# Patient Record
Sex: Female | Born: 1935 | Race: White | Hispanic: No | Smoking: Never smoker
Health system: Southern US, Community
[De-identification: ages and names within clinical notes are randomized; demographics above are authoritative.]

## PROBLEM LIST (undated history)

## (undated) DIAGNOSIS — Z8619 Personal history of other infectious and parasitic diseases: Secondary | ICD-10-CM

## (undated) DIAGNOSIS — E079 Disorder of thyroid, unspecified: Secondary | ICD-10-CM

## (undated) DIAGNOSIS — C679 Malignant neoplasm of bladder, unspecified: Secondary | ICD-10-CM

## (undated) DIAGNOSIS — D649 Anemia, unspecified: Secondary | ICD-10-CM

## (undated) DIAGNOSIS — E785 Hyperlipidemia, unspecified: Secondary | ICD-10-CM

## (undated) DIAGNOSIS — G8929 Other chronic pain: Secondary | ICD-10-CM

## (undated) DIAGNOSIS — M5136 Other intervertebral disc degeneration, lumbar region: Secondary | ICD-10-CM

## (undated) DIAGNOSIS — M545 Low back pain, unspecified: Secondary | ICD-10-CM

## (undated) DIAGNOSIS — C801 Malignant (primary) neoplasm, unspecified: Secondary | ICD-10-CM

## (undated) DIAGNOSIS — H409 Unspecified glaucoma: Secondary | ICD-10-CM

## (undated) DIAGNOSIS — I739 Peripheral vascular disease, unspecified: Secondary | ICD-10-CM

## (undated) DIAGNOSIS — M4620 Osteomyelitis of vertebra, site unspecified: Secondary | ICD-10-CM

## (undated) DIAGNOSIS — M51369 Other intervertebral disc degeneration, lumbar region without mention of lumbar back pain or lower extremity pain: Secondary | ICD-10-CM

## (undated) DIAGNOSIS — Z87820 Personal history of traumatic brain injury: Secondary | ICD-10-CM

## (undated) DIAGNOSIS — K635 Polyp of colon: Secondary | ICD-10-CM

## (undated) DIAGNOSIS — Z9221 Personal history of antineoplastic chemotherapy: Secondary | ICD-10-CM

## (undated) DIAGNOSIS — H9190 Unspecified hearing loss, unspecified ear: Secondary | ICD-10-CM

## (undated) DIAGNOSIS — E042 Nontoxic multinodular goiter: Secondary | ICD-10-CM

## (undated) DIAGNOSIS — M5030 Other cervical disc degeneration, unspecified cervical region: Secondary | ICD-10-CM

## (undated) DIAGNOSIS — Z9289 Personal history of other medical treatment: Secondary | ICD-10-CM

## (undated) DIAGNOSIS — M810 Age-related osteoporosis without current pathological fracture: Secondary | ICD-10-CM

## (undated) DIAGNOSIS — K219 Gastro-esophageal reflux disease without esophagitis: Secondary | ICD-10-CM

## (undated) DIAGNOSIS — I10 Essential (primary) hypertension: Secondary | ICD-10-CM

## (undated) DIAGNOSIS — R011 Cardiac murmur, unspecified: Secondary | ICD-10-CM

## (undated) DIAGNOSIS — R600 Localized edema: Secondary | ICD-10-CM

## (undated) DIAGNOSIS — K2210 Ulcer of esophagus without bleeding: Secondary | ICD-10-CM

## (undated) DIAGNOSIS — M79673 Pain in unspecified foot: Secondary | ICD-10-CM

## (undated) DIAGNOSIS — K259 Gastric ulcer, unspecified as acute or chronic, without hemorrhage or perforation: Secondary | ICD-10-CM

## (undated) HISTORY — DX: Other chronic pain: G89.29

## (undated) HISTORY — DX: Anemia, unspecified: D64.9

## (undated) HISTORY — DX: Low back pain: M54.5

## (undated) HISTORY — DX: Malignant (primary) neoplasm, unspecified: C80.1

## (undated) HISTORY — PX: FOOT SURGERY: SHX648

## (undated) HISTORY — DX: Polyp of colon: K63.5

## (undated) HISTORY — DX: Essential (primary) hypertension: I10

## (undated) HISTORY — PX: EYE SURGERY: SHX253

## (undated) HISTORY — DX: Other intervertebral disc degeneration, lumbar region: M51.36

## (undated) HISTORY — DX: Other cervical disc degeneration, unspecified cervical region: M50.30

## (undated) HISTORY — DX: Hyperlipidemia, unspecified: E78.5

## (undated) HISTORY — DX: Gastric ulcer, unspecified as acute or chronic, without hemorrhage or perforation: K25.9

## (undated) HISTORY — PX: MANDIBLE SURGERY: SHX707

## (undated) HISTORY — DX: Other intervertebral disc degeneration, lumbar region without mention of lumbar back pain or lower extremity pain: M51.369

## (undated) HISTORY — PX: KNEE ARTHROSCOPY: SHX127

## (undated) HISTORY — DX: Age-related osteoporosis without current pathological fracture: M81.0

## (undated) HISTORY — DX: Low back pain, unspecified: M54.50

## (undated) HISTORY — DX: Pain in unspecified foot: M79.673

## (undated) HISTORY — PX: CATARACT EXTRACTION: SUR2

## (undated) HISTORY — PX: CARPAL TUNNEL RELEASE: SHX101

## (undated) HISTORY — DX: Unspecified glaucoma: H40.9

## (undated) HISTORY — PX: WRIST FRACTURE SURGERY: SHX121

## (undated) HISTORY — DX: Peripheral vascular disease, unspecified: I73.9

## (undated) HISTORY — PX: BACK SURGERY: SHX140

---

## 1971-05-29 HISTORY — PX: ABDOMINAL HYSTERECTOMY: SHX81

## 1997-09-08 ENCOUNTER — Inpatient Hospital Stay (HOSPITAL_COMMUNITY): Admission: RE | Admit: 1997-09-08 | Discharge: 1997-09-17 | Payer: Self-pay | Admitting: Neurosurgery

## 1999-08-30 ENCOUNTER — Encounter: Admission: RE | Admit: 1999-08-30 | Discharge: 1999-08-30 | Payer: Self-pay | Admitting: Neurosurgery

## 1999-08-30 ENCOUNTER — Encounter: Payer: Self-pay | Admitting: Neurosurgery

## 2000-03-25 ENCOUNTER — Encounter: Admission: RE | Admit: 2000-03-25 | Discharge: 2000-05-07 | Payer: Self-pay | Admitting: Family Medicine

## 2004-08-28 ENCOUNTER — Ambulatory Visit: Payer: Self-pay | Admitting: Internal Medicine

## 2004-10-19 ENCOUNTER — Ambulatory Visit: Payer: Self-pay | Admitting: Podiatry

## 2004-12-26 ENCOUNTER — Ambulatory Visit: Payer: Self-pay | Admitting: Podiatry

## 2004-12-29 ENCOUNTER — Ambulatory Visit: Payer: Self-pay | Admitting: Podiatry

## 2005-09-27 ENCOUNTER — Ambulatory Visit: Payer: Self-pay | Admitting: Internal Medicine

## 2006-09-30 ENCOUNTER — Ambulatory Visit: Payer: Self-pay | Admitting: Internal Medicine

## 2007-01-09 ENCOUNTER — Ambulatory Visit: Payer: Self-pay | Admitting: Unknown Physician Specialty

## 2007-02-03 ENCOUNTER — Other Ambulatory Visit: Payer: Self-pay

## 2007-02-03 ENCOUNTER — Ambulatory Visit: Payer: Self-pay | Admitting: Unknown Physician Specialty

## 2007-02-10 ENCOUNTER — Inpatient Hospital Stay: Payer: Self-pay | Admitting: Unknown Physician Specialty

## 2007-03-04 ENCOUNTER — Ambulatory Visit: Payer: Self-pay | Admitting: Unknown Physician Specialty

## 2007-07-01 ENCOUNTER — Ambulatory Visit: Payer: Self-pay | Admitting: Internal Medicine

## 2007-11-20 ENCOUNTER — Ambulatory Visit: Payer: Self-pay | Admitting: Internal Medicine

## 2008-03-05 ENCOUNTER — Other Ambulatory Visit: Payer: Self-pay

## 2008-10-14 ENCOUNTER — Ambulatory Visit: Payer: Self-pay

## 2009-02-16 ENCOUNTER — Ambulatory Visit: Payer: Self-pay | Admitting: Internal Medicine

## 2009-03-08 ENCOUNTER — Ambulatory Visit: Payer: Self-pay | Admitting: Unknown Physician Specialty

## 2009-09-06 ENCOUNTER — Ambulatory Visit: Payer: Self-pay

## 2009-09-23 ENCOUNTER — Ambulatory Visit: Payer: Self-pay | Admitting: Unknown Physician Specialty

## 2009-09-27 ENCOUNTER — Ambulatory Visit: Payer: Self-pay | Admitting: Unknown Physician Specialty

## 2010-02-21 ENCOUNTER — Ambulatory Visit: Payer: Self-pay | Admitting: Internal Medicine

## 2011-02-23 ENCOUNTER — Ambulatory Visit: Payer: Self-pay | Admitting: Internal Medicine

## 2011-05-13 ENCOUNTER — Ambulatory Visit: Payer: Self-pay

## 2012-02-27 ENCOUNTER — Ambulatory Visit: Payer: Self-pay | Admitting: Internal Medicine

## 2012-04-27 DIAGNOSIS — Z87820 Personal history of traumatic brain injury: Secondary | ICD-10-CM

## 2012-04-27 HISTORY — DX: Personal history of traumatic brain injury: Z87.820

## 2012-05-16 DIAGNOSIS — S020XXA Fracture of vault of skull, initial encounter for closed fracture: Secondary | ICD-10-CM | POA: Insufficient documentation

## 2012-05-16 DIAGNOSIS — S065X9A Traumatic subdural hemorrhage with loss of consciousness of unspecified duration, initial encounter: Secondary | ICD-10-CM | POA: Insufficient documentation

## 2012-05-16 DIAGNOSIS — I609 Nontraumatic subarachnoid hemorrhage, unspecified: Secondary | ICD-10-CM | POA: Insufficient documentation

## 2012-05-16 DIAGNOSIS — S0219XA Other fracture of base of skull, initial encounter for closed fracture: Secondary | ICD-10-CM | POA: Insufficient documentation

## 2012-05-16 DIAGNOSIS — W108XXA Fall (on) (from) other stairs and steps, initial encounter: Secondary | ICD-10-CM | POA: Insufficient documentation

## 2012-05-16 DIAGNOSIS — D62 Acute posthemorrhagic anemia: Secondary | ICD-10-CM | POA: Insufficient documentation

## 2013-02-27 ENCOUNTER — Ambulatory Visit: Payer: Self-pay | Admitting: Internal Medicine

## 2013-09-23 ENCOUNTER — Ambulatory Visit: Payer: Self-pay | Admitting: Ophthalmology

## 2013-09-23 DIAGNOSIS — Z01810 Encounter for preprocedural cardiovascular examination: Secondary | ICD-10-CM

## 2013-09-23 DIAGNOSIS — I10 Essential (primary) hypertension: Secondary | ICD-10-CM

## 2013-09-23 LAB — POTASSIUM: Potassium: 4.4 mmol/L (ref 3.5–5.1)

## 2013-10-07 ENCOUNTER — Ambulatory Visit: Payer: Self-pay | Admitting: Ophthalmology

## 2013-10-28 DIAGNOSIS — M47812 Spondylosis without myelopathy or radiculopathy, cervical region: Secondary | ICD-10-CM | POA: Insufficient documentation

## 2013-10-28 DIAGNOSIS — M5030 Other cervical disc degeneration, unspecified cervical region: Secondary | ICD-10-CM | POA: Insufficient documentation

## 2013-11-04 ENCOUNTER — Ambulatory Visit: Payer: Self-pay | Admitting: Ophthalmology

## 2013-11-04 LAB — POTASSIUM: Potassium: 3.6 mmol/L (ref 3.5–5.1)

## 2013-11-10 ENCOUNTER — Ambulatory Visit: Payer: Self-pay | Admitting: Ophthalmology

## 2014-01-09 DIAGNOSIS — I739 Peripheral vascular disease, unspecified: Secondary | ICD-10-CM

## 2014-01-09 DIAGNOSIS — M810 Age-related osteoporosis without current pathological fracture: Secondary | ICD-10-CM | POA: Insufficient documentation

## 2014-01-09 DIAGNOSIS — R609 Edema, unspecified: Secondary | ICD-10-CM | POA: Insufficient documentation

## 2014-01-09 DIAGNOSIS — N183 Chronic kidney disease, stage 3 unspecified: Secondary | ICD-10-CM | POA: Insufficient documentation

## 2014-01-09 DIAGNOSIS — I129 Hypertensive chronic kidney disease with stage 1 through stage 4 chronic kidney disease, or unspecified chronic kidney disease: Secondary | ICD-10-CM | POA: Insufficient documentation

## 2014-01-09 DIAGNOSIS — I779 Disorder of arteries and arterioles, unspecified: Secondary | ICD-10-CM | POA: Insufficient documentation

## 2014-01-09 DIAGNOSIS — E785 Hyperlipidemia, unspecified: Secondary | ICD-10-CM | POA: Insufficient documentation

## 2014-01-09 DIAGNOSIS — E782 Mixed hyperlipidemia: Secondary | ICD-10-CM | POA: Insufficient documentation

## 2014-01-09 DIAGNOSIS — M1990 Unspecified osteoarthritis, unspecified site: Secondary | ICD-10-CM | POA: Insufficient documentation

## 2014-01-09 HISTORY — DX: Disorder of arteries and arterioles, unspecified: I77.9

## 2014-03-02 ENCOUNTER — Ambulatory Visit: Payer: Self-pay | Admitting: Internal Medicine

## 2014-04-08 ENCOUNTER — Ambulatory Visit: Payer: Self-pay | Admitting: Unknown Physician Specialty

## 2014-05-18 ENCOUNTER — Ambulatory Visit: Payer: Self-pay | Admitting: Unknown Physician Specialty

## 2014-05-28 DIAGNOSIS — C801 Malignant (primary) neoplasm, unspecified: Secondary | ICD-10-CM

## 2014-05-28 HISTORY — DX: Malignant (primary) neoplasm, unspecified: C80.1

## 2014-07-27 ENCOUNTER — Ambulatory Visit
Admit: 2014-07-27 | Disposition: A | Discharge status: Home / Self Care | Payer: Self-pay | Attending: Internal Medicine | Admitting: Internal Medicine

## 2014-08-03 ENCOUNTER — Inpatient Hospital Stay: Payer: Self-pay | Admitting: Internal Medicine

## 2014-08-27 ENCOUNTER — Ambulatory Visit
Admit: 2014-08-27 | Disposition: A | Discharge status: Home / Self Care | Payer: Self-pay | Attending: Internal Medicine | Admitting: Internal Medicine

## 2014-09-08 LAB — CANCER CENTER HEMOGLOBIN: HGB: 8.9 g/dL — ABNORMAL LOW (ref 12.0–16.0)

## 2014-09-18 NOTE — Op Note (Signed)
PATIENT NAME:  [name], GROSSO MR#:  [id number] DATE OF BIRTH:  08-03-1932  DATE OF PROCEDURE:  10/07/2013  PREOPERATIVE DIAGNOSIS:  Senile cataract of the right eye.  POSTOPERATIVE DIAGNOSIS:  Senile cataract of the right eye.  PROCEDURE:  Phacoemulsification with posterior chamber intraocular lens placement of the right eye.   LENS: ZCT150 23.5-diopter posterior chamber toric intraocular lens with 1.5-diopters of cylindrical power placed at axis 168 degrees.  ULTRASOUND TIME:  16% of 1 minute and 53 seconds.  CDE 17.7.   SURGEON:  Mali Vicenta Olds, MD  ANESTHESIA:  Topical with tetracaine drops and 2% Xylocaine jelly.  COMPLICATIONS:  None.  DESCRIPTION OF PROCEDURE:  The patient was identified in the holding room and transported to the operating suite and placed in upright position.  The eye was marked so that the 0, 90 and 180 degree axis were clearly marked with purple ink.  Xylocaine gel was placed into the eye and then the eye was prepped and draped in the usual sterile ophthalmic fashion.  The operating microscope was placed into position.   A 1-millimeter clear-corneal paracentesis incision was made at the 12 o'clock position.  The anterior chamber was filled with Viscoat.  A 2.4-millimeter near clear corneal incision was then made at the 9 o'clock position.  A cystotome and capsulorrhexis forceps were then used to make a curvilinear capsulorrhexis.  Hydrodissect and hydrodelineation were then performed using balanced salt solution.   Phacoemulsification was then used in stop and chop fashion to remove the lens, nucleus and epinucleus.  The remaining cortex was aspirated using the irrigation and aspiration handpiece.  Provisc viscoelastic was then placed into the capsular bag to distend it for lens placement.  An axis marker was then dipped in purple ink and used to mark the axis position of 168 degrees for placement of the toric intraocular lens.  The 168 degree axis has been  previously determined by the toric lens calculator.   A ZCT150 23.5-diopter lens was then injected into the capsular bag.  It was rotated clockwise until the axis marks on the lens were approximately 15 degrees in the counterclockwise direction to the 168 degree axis mark on the cornea.  The viscoelastic was aspirated from the eye using the irrigation aspiration handpiece.  Then, a Sinskey hook through the side port incision was used to rotate the lens in a clockwise direction until the axis markings of the intraocular lens were lined up with the axis markings on the cornea.  Balanced salt solution was then used to hydrate the wounds.   Miostat was placed into the anterior chamber to constrict the pupil.  0.1 mL of cefuroxime 10 mg/mL were injected into the anterior chamber for a dose of 1 mg of intracameral antibiotic at the completion of the case. The eye was noted to have a physiologic pressure and there was no wound leak noted.  Topical Vigamox drops and erythromycin ointment were applied to the eye.  The patient was taken to the recovery room in stable condition having had no  complications of anesthesia or surgery.  ____________________________ Wyonia Hough, MD crb:aw D: 10/07/2013 13:38:20 ET T: 10/07/2013 13:55:18 ET JOB#: [id number]  cc: Wyonia Hough, MD, <Dictator> Leandrew Koyanagi MD ELECTRONICALLY SIGNED 10/14/2013 12:38

## 2014-09-18 NOTE — Op Note (Signed)
PATIENT NAME:  [name], [name] MR#:  [id number] DATE OF BIRTH:  1933-01-28  DATE OF PROCEDURE:  11/10/2013  PREOPERATIVE DIAGNOSIS:  Senile cataract of the left eye.  POSTOPERATIVE DIAGNOSIS:  Senile cataract of the left eye.  PROCEDURE:  Phacoemulsification with posterior chamber intraocular lens placement of the left eye.   LENS: ZCT150 23.0-diopter posterior chamber toric intraocular lens with 1.5 diopters of cylindrical power placed at axis 14 degrees.  ULTRASOUND TIME:  14% of 1 minute, 8 seconds.  CDE 9.8.   SURGEON:  Mali Brasington, MD  ANESTHESIA:  Topical with tetracaine drops and 2% Xylocaine jelly.  COMPLICATIONS:  None.  DESCRIPTION OF PROCEDURE:  The patient was identified in the holding room and transported to the operating suite and placed in upright position.  The eye was marked so that the 0, 90 and 180 degree axis were clearly marked with purple ink.  Xylocaine gel was placed into the eye and then the eye was prepped and draped in the usual sterile ophthalmic fashion.  The operating microscope was placed into position.   A clear-corneal paracentesis incision was made at the 1:30 o'clock position.  The anterior chamber was filled with Viscoat.  A 2.4-millimeter near clear corneal incision was then made at the 10:30 o'clock position.  A cystotome and capsulorrhexis forceps were then used to make a curvilinear capsulorrhexis.  Hydrodissect and hydrodelineation were then performed using balanced salt solution.   Phacoemulsification was then used in stop and chop fashion to remove the lens, nucleus and epinucleus.  The remaining cortex was aspirated using the irrigation and aspiration handpiece.  Provisc viscoelastic was then placed into the capsular bag to distend it for lens placement.  An axis marker was then dipped in purple ink and used to mark the axis position of 14 degrees for placement of the toric intraocular lens.  The 14 degree axis has been previously determined  by the Verion digital marker.   A ZCT150 23.0-diopter lens was then injected into the capsular bag.  It was rotated clockwise until the axis marks on the lens were approximately 15 degrees in the counterclockwise direction to the 14 degree axis mark on the cornea.  The viscoelastic was aspirated from the eye using the irrigation aspiration handpiece.  Then, a Sinskey hook through the side port incision was used to rotate the lens in a clockwise direction until the axis markings of the intraocular lens were lined up with the axis markings on the cornea.  Balanced salt solution was then used to hydrate the wounds.   Miostat was placed into the anterior chamber to constrict the pupil.  0.1 mL of cefuroxime 10 mg/mL were injected into the anterior chamber for a dose of 1 mg of intracameral antibiotic at the completion of the case. The eye was noted to have a physiologic pressure and there was no wound leak noted.  Topical Vigamox drops and erythromycin ointment were applied to the eye.  The patient was taken to the recovery room in stable condition having had no complications of anesthesia or surgery.  ____________________________ Wyonia Hough, MD crb:aw D: 11/10/2013 12:05:16 ET T: 11/10/2013 12:27:02 ET JOB#: [id number]  cc: Wyonia Hough, MD, <Dictator>  Leandrew Koyanagi MD ELECTRONICALLY SIGNED 11/11/2013 9:36

## 2014-09-20 LAB — SURGICAL PATHOLOGY

## 2014-09-22 ENCOUNTER — Ambulatory Visit
Admit: 2014-09-22 | Disposition: A | Discharge status: Home / Self Care | Payer: Self-pay | Attending: Urology | Admitting: Urology

## 2014-09-22 ENCOUNTER — Ambulatory Visit: Payer: Self-pay

## 2014-09-22 DIAGNOSIS — M461 Sacroiliitis, not elsewhere classified: Secondary | ICD-10-CM | POA: Insufficient documentation

## 2014-09-26 NOTE — Discharge Summary (Signed)
PATIENT NAME:  [name], [name] MR#:  [id number] DATE OF BIRTH:  Aug 09, 1932  DATE OF ADMISSION:  08/03/2014 DATE OF DISCHARGE:  08/05/2014   DISCHARGE DIAGNOSES:  1.  Symptomatic anemia, likely due to combination of gastritis and ulcer in the duodenum and iron deficiency weight loss from thyroid and urinary bladder nodules, need further work-up as outpatient in [place].  2.  Hypertension.   CONDITION ON DISCHARGE: Stable.   MEDICATIONS ON DISCHARGE:  1.  Evista 60 mg oral tablet once a day.  2.  Calcium and vitamin D 500 mg 2 times a day.  3.  Pravastatin 40 mg once a day.  4.  Tylenol 625 mg 2 times a day as needed.  5.  Carvedilol 25 mg oral tablet 2 times a day.  6.  Latanoprost ophthalmic drops each affected eye once a day.  7.  Lisinopril 10 mg once a day.  8.  Cardizem 240 mg extended release once a day.  9.  Pantoprazole 40 mg 2 times a day.  10. Ferrous sulfate 325 mg 3 times a day.    FOLLOW UP:  Advised to follow in 1 to 2 weeks in [place] for CBC check and 4 to 6 weeks in GI clinic and 1 to 2 weeks in urology clinic.   HISTORY OF PRESENT ILLNESS: A 81 year old female with history of cervical spondylosis, hyperlipidemia, hypertension, colon polyp and some internal hemorrhoid.  She has been losing weight almost 25 pounds in the last 1 year feeling very tired for a few months and so went to annual check-up to Dr. Tonette Bihari office who ordered some blood work and found hemoglobin to be 6 so she was called in for a direct admission.   HOSPITAL COURSE AND STAY:   1.  Symptomatic anemia. She was feeling tired and weak. Hemoglobin was low. Microcytic anemia was noted.  Stool guaiac was negative, 2 units of PRBC transfusion was given. After that, hemoglobin was stable.  IV iron therapy was ordered by oncology team and suggested to follow in [place].  She also was found to have gastritis and endoscopy was done and suggested PPI b.i.d. and follow-up in GI clinic after  a few weeks.  2.  Weight loss and generalized weakness. Dr. Ma Hillock ordered CT scan of the chest and pelvis which showed some nodules in the urinary bladder and thyroid nodule. Urologic consult was called in, and he suggested to follow in the office and cystoscopy can be done as an outpatient for biopsy, but did not think that it might be cancerous nodule.  Thyroid nodule was found on CT and ultrasound of the neck, and Dr. Leia Alf agreed to take care of it at the [facility] on her further follow-ups.  3.  Hypertension. We continued carvedilol.  4.  Hyperlipidemia. Continue pravastatin.  5.  Glaucoma. Continue latanoprost.  6.  Osteoarthritis. The patient was taking extra strength Tylenol tablets and we advised to stop taking that because of gastritis and give her PPI b.i.d.  7.  Anticoagulation. She was not on any chemical anticoagulation for prevention of deep vein thrombosis in the hospital because of low hemoglobin issue. She remains stable.    [place]:  Gastrointestinal consult with Dr. Gustavo Lah.    IMPORTANT LABORATORY RESULTS:  WBC count 8.7, hemoglobin is 5.8, platelet count platelet count is 283,000.   BMP, glucose 106, BUN 16, creatinine 0.7, sodium 138, potassium 4.2, chloride 103, and CO2 of 26.  INR 1.2,  iron saturation was 3 and iron serum was 10. Hemoglobin came up to 7.7 after transfusion.  Occult blood was negative in the stool.  Ultrasound of the soft tissue neck, bilateral nodule, and abdominal nodule in the lower pole of the right lobe need further work-up.      ____________________________ Ceasar Lund Anselm Jungling, MD vgv:DT D: 08/09/2014 12:20:02 ET T: 08/09/2014 13:14:06 ET JOB#: [id number]  cc: Ceasar Lund. Anselm Jungling, MD, <Dictator> Lollie Sails, MD Rhett Bannister. Ma Hillock, MD  Vaughan Basta MD ELECTRONICALLY SIGNED 08/17/2014 12:13

## 2014-09-26 NOTE — Consult Note (Signed)
ONCOLOGY followup note - still weak. Overall better. no fevers. Eating well.resting in bed, alert and oriented, no acute distress.            vitals - afebrile, stable.          lungs - bilateral good breath sounds          abd - soft, NT          ext - pedal edema  WBC 8500, Hb 7.7, platelets 564. Stool OB negative. Serum iron 10, iron saturation 3%. Serum B12, folate, Coombs  test, LDH unremarkable.  1. Iron def anemia, stool OB negative. Given this alongwith unintentional weight loss, will consult GI. Hb slightly better. Will give 1 dose IV Venofer 500 mg today to try improve symptomatic anemia quicker. If no GI procedures planned,  ten start on oral ferrous sulfate 325 mg TID w/meals.Unintentional weight loss despite eating well  - CT scan shows possible bladder lesion. Agree with getting urology eval. There is a right thyroid mass, will get thyroid US to evaluate further. and no other obvious etiology or explanation for this weight loss. Serum TSH normal. continue to follow.  Electronic Signatures: Jonn Shingles (MD)  (Signed on 09-Mar-16 23:26)  Authored  Last Updated: 09-Mar-16 23:26 by Jonn Shingles (MD)

## 2014-09-26 NOTE — H&P (Signed)
PATIENT NAME:  [name], [name] MR#:  [id number] DATE OF BIRTH:  26-Sep-1932  PRIMARY CARE PHYSICIAN: Ocie Cornfield. Ouida Sills, [place] PHYSICIAN: Ocie Cornfield. Ouida Sills, MD   CHIEF COMPLAINT: Weakness and low hemoglobin.   HISTORY OF PRESENT ILLNESS: This is a 81 year old female, who has history of cervical spondylosis, hyperlipidemia, hypertension, colon polyps and some internal hemorrhoids. She said that for the last 1 year, she has been losing weight, almost 25 pounds in 1 year. She has started feeling very tired and weak for the last few months. Yesterday she had her regular annual checkup with Dr. Ouida Sills and he ordered some blood work, which reported her hemoglobin to be 6, so he called the patient and told her she has to go to the hospital to get admitted and get a  blood transfusion. He called me for direct admission. On my further questioning, the patient denies any other complaint of noticing blood in the stool or urine. She takes Tylenol extra strength 650 mg 2 tablets in the morning and in the evening, so a total of 4 tablets a day. She denies using any over-the-counter pain medications or BC powders, denies any nausea, vomiting, or abdominal pain. Denies any nausea, vomiting or abnormal pain. Denies any abnormal vaginal discharge or bleeding.   REVIEW OF SYSTEMS:  CONSTITUTIONAL: Positive for generalized weakness and fatigue and weight loss. Negative for fever.  EYES: No blurring of vision, discharge or redness.  EARS, NOSE, THROAT: No tinnitus, ear pain, or hearing loss.  RESPIRATORY: No cough, wheezing, hemoptysis or shortness of breath.  CARDIOVASCULAR: No chest pain, orthopnea, edema, arrhythmia, palpitation.  GASTROINTESTINAL: No nausea, vomiting, diarrhea, abdominal pain.  GENITOURINARY: No dysuria, hematuria, increased frequency.  ENDOCRINE: No heat or cold intolerance. No excessive sweating.  SKIN: No acne, rashes, or lesions.  MUSCULOSKELETAL: No pain or swelling in  the joints.  NEUROLOGICAL: No numbness, weakness, tremor or vertigo.  PSYCHIATRIC: No anxiety, insomnia, bipolar disorder.   PAST MEDICAL HISTORY:  1. Hyperlipidemia.  2. Chronic low back pain.  3. Hypertension.  5. Foot pain. 6. Osteoporosis.  7. History of colon polyp and internal hemorrhoid.   PAST SURGICAL HISTORY:  1. Lumbar diskectomy.  2. Status post hysterectomy.  3. C-spine surgery and back surgery total back surgeries x4, total of 6 back surgeries and spinal fusions.  4. Colonoscopy.  Had polyps in the past, but in November 2015, there was no polyp, just some internal hemorrhoids.   FAMILY HISTORY: Positive for mother having heart disease and hypertension and father having emphysema. Sister has diabetes. Her mother lived up to 66 years, father died at age of 8.   SOCIAL HISTORY: She is widowed for 6 years, lives alone. Her son lives nearby and takes care of her. No smoking. No drinking, alcohol no illegal drug use and ready active for her age.   CURRENT MEDICATIONS: 1. Raloxifene and 60 mg by mouth once a day.  2. Aspirin 81 mg once a day.  3. Calcium 500 plus vitamin D3 2 times a day.  4. Carvedilol 25 mg 12.5 mg tablet 2 times a day.  5. Cardizem 240 mg extended release once a day.  6. Gabapentin 400 mg 2 capsules at night.  7. Pravastatin 40 mg at night.  8. Torsemide 20 mg by mouth daily as needed.   PHYSICAL EXAMINATION:  VITAL SIGNS: Temperature 98.2, pulse is 72, respirations 16, blood pressure 115/65, pulse oximetry is 98% on room air.  GENERAL: The patient is  fully alert and oriented to time, place, and person. Does not appear in acute distress.  HEENT: Head and neck atraumatic. Conjunctivae are pale. Oral mucosa moist. Sclerae anicteric.  NECK: Supple. No JVD.  Thyroid nontender RESPIRATORY: Bilateral equal and clear air entry. No crepitation or wheezing.  CARDIOVASCULAR: S1, S2 present, regular. No murmur appreciated. No tenderness on chest palpation.   ABDOMEN: Soft, nontender. Bowel sounds present. No organomegaly. No distention.  SKIN: No acne, rashes, or lesions.  JOINTS: No swelling or tenderness.  NEUROLOGICAL: Power 5/5, follows commands. Sensation is intact. Reflexes intact. No tremor or rigidity.  PSYCHIATRIC: Does not appear in acute psychiatric illness at this time.   IMPORTANT LABORATORY RESULTS: Glucose 106, BUN 16, creatinine 0.7, sodium 138, potassium 4.2, chloride 103, CO2 26, and calcium is 9. Total protein is 7.4, albumin 3, bilirubin 0.7, alkaline phosphatase 65, SGOT 13, SGPT 11. WBC is 8.7, hemoglobin 5.8, platelet count 583,000, MCV is 64. INR is 1.2.   ASSESSMENT AND PLAN: A 81 year old female with a history of some polyps and internal hemorrhoids, presented to hospital, as Dr. Ouida Sills told her to go to hospital and get admitted directly for her low hemoglobin level.   1. Symptomatic anemia, as the patient was feeling tired and weak and hemoglobin is low. Will transfuse 2 units of hemoglobin. Microcytic anemia ordered iron studies and stool guaiac and gastroenterology consult and oncology consult also.  2. Weight loss and generalized weakness. Will also get oncology consult for further management of this issue and also to help with having anemia diagnosis.  3. Hypertension. We will continue her carvedilol.  4. Hyperlipidemia. Continue her pravastatin.  5. Glaucoma. Continue Latanoprost.  6. Osteoporosis. She takes calcium and vitamin D at home. Currently will hold it until further workup.  7. Osteoarthritis. Takes Tylenol Extra Strength 2 tablets 2 times a day. We will currently hold it because of this low hemoglobin issue.  8. Anticoagulation. Will not any chemical anticoagulation at this time, just provide sequential compression devices.   CODE STATUS: Full code.   TOTAL TIME SPENT ON THIS ADMISSION: 50 minutes.    ____________________________ Ceasar Lund Anselm Jungling, MD vgv:ap D: 08/03/2014 13:49:44  ET T: 08/03/2014 14:51:59 ET JOB#: [id number]  cc: Ceasar Lund. Anselm Jungling, MD, <Dictator> Ocie Cornfield. Ouida Sills, MD Vaughan Basta MD ELECTRONICALLY SIGNED 08/12/2014 11:52

## 2014-09-26 NOTE — Consult Note (Signed)
Chief Complaint:  Subjective/Chief Complaint seen for IDA.  doing well , feeling some better after tfx.  no abd pain or nausea, no bm since admission yesterday.  Has seen no evidence of GI bleeding.   VITAL SIGNS/ANCILLARY NOTES: **Vital Signs.:   10-Mar-16 08:46  Vital Signs Type Routine  Temperature Temperature (F) 98  Temperature Source oral  Pulse Pulse 60  Respirations Respirations 18  Systolic BP Systolic BP 160  Diastolic BP (mmHg) Diastolic BP (mmHg) 64  Mean BP 93  Pulse Ox % Pulse Ox % 93  Pulse Ox Activity Level  At rest  Oxygen Delivery Room Air/ 21 %   Brief Assessment:  Cardiac Regular   Respiratory clear BS   Gastrointestinal details normal Soft  Nontender  Nondistended  No masses palpable  Bowel sounds normal   Lab Results: Routine Hem:  08-Mar-16 11:15   Hemoglobin (CBC)  5.8  09-Mar-16 05:14   Hemoglobin (CBC)  7.7  10-Mar-16 03:40   WBC (CBC) 10.3  RBC (CBC)  3.59  Hemoglobin (CBC)  7.8  Hematocrit (CBC)  24.6  Platelet Count (CBC)  554  MCV  69  MCH  21.8  MCHC  31.8  RDW  24.6  Neutrophil % 62.2  Lymphocyte % 24.0  Monocyte % 12.3  Eosinophil % 1.1  Basophil % 0.4  Neutrophil # 6.4  Lymphocyte # 2.5  Monocyte #  1.3  Eosinophil # 0.1  Basophil # 0.0 (Result(s) reported on 05 Aug 2014 at [facility].)   Radiology Results: CT:    09-Mar-16 08:50, CT Chest, Abd, and Pelvis With Contrast  CT Chest, Abd, and Pelvis With Contrast   REASON FOR EXAM:    (1) Progressive unintentional weight loss; (2)   Progressive unintentional weight  COMMENTS:       PROCEDURE: CT  - CT CHEST ABDOMEN AND PELVIS W  - Aug 04 2014  8:50AM     CLINICAL DATA:  Unintentional weight loss of 25 pounds over the last  year. Low blood count. Hysterectomy. Spinal fusions.    EXAM:  CT CHEST, ABDOMEN, AND PELVIS WITH CONTRAST    TECHNIQUE:  Multidetector CT imaging of the chest, abdomen and pelvis was  performed following the standard protocol during  bolus  administration of intravenous contrast.    CONTRAST:  100 cc Omnipaque 350    COMPARISON:  None.    FINDINGS:  CT CHEST FINDINGS    Mediastinum/Nodes: 1.7 cm hypo attenuating right-sided thyroid  nodule on image 1 of series 2.    Aortic and branch vessel atherosclerosis. Mild cardiomegaly with  multivessel coronary artery atherosclerosis. Multiple small middle  mediastinal nodes, without mediastinal or hilar adenopathy. Trace  pericardial fluid or thickening anteriorly, likely physiologic.    Lungs/Pleura: No pleural fluid.    Perifissural posterior right upper lobe 2 mm nodule on image 19 of  series 4.    Musculoskeletal: No acute osseous abnormality.    CT ABDOMEN PELVIS FINDINGS    Hepatobiliary: Mild hepatomegaly, 18.3 cm craniocaudal. No focal  liver lesion. Normal gallbladder, without biliary ductal dilatation.    Beam hardening artifact from lumbar spine fixation causes mild  degradation.    Pancreas: Normal, without mass or ductal dilatation.    Spleen: Normal    Adrenals/Urinary Tract: Normal adrenal glands. Normal kidneys,  without hydronephrosis or hydroureter. Ureters are well opacified,  without filling defect identified.    A right bladder base nodule measures 11 mm on image 110 of series 2  .  Stomach/Bowel: Normal stomach, without wall thickening. Normal  colon, appendix, and terminal ileum. Normal small bowel.  Vascular/Lymphatic: Aortic and branch vessel atherosclerosis. 9 mm  aortocaval node on image 64 of series 2 is not pathologic by size  criteria. No abdominopelvic adenopathy.    Reproductive: Hysterectomy.  No adnexal mass.    Other: No significant free fluid.    Musculoskeletal: Degenerative sclerosis of the symphysis pubis.  Lumbar spine fixation. Nonspecific postoperative surrounding fluid  collections. Eccentric right portion measures 4.2 cm on image 74.  Appears progressive since 05/18/2014.     IMPRESSION:  CT CHEST  IMPRESSION  1.  No acute process in the chest.  2.  Atherosclerosis, including within the coronary arteries.  3. Tiny nonspecific posterior right upper lobe pulmonary nodule  which can be re-evaluated on follow-up.  4. Right thyroid nodule which warrants further evaluation with  thyroid ultrasound.    CT ABDOMEN AND PELVIS IMPRESSION    1. Right bladder base nodule, highly suspicious for transitional  cell carcinoma. These results will be called to the ordering  clinician or representative by the Radiologist Assistant, and  communication documented in the PACS or zVision Dashboard.  2. No evidence of metastatic disease within the abdomen or pelvis.  3. Postsurgical changes within the lumbar spine. Increased (since  05/18/2014) in nonspecific multiloculated postoperative fluid  collection/collections.      Electronically Signed    By: Abigail Miyamoto M.D.    On: 08/04/2014 09:24         Verified By: Areta Haber, M.D.,   Assessment/Plan:  Assessment/Plan:  Assessment 1) IDA, heme negative.  UDE.  recent colonoscopy not informative.  No overt GI sx.   2) abnormal CT bladder-further evaluation pending.   Plan 1) EGD today.  I have discussed the risks benefits aand complications of proceedure to include not limited to bleeding infection perforation and sedation and she wishes to proceed.  further recs to follow.   Electronic Signatures: [name] (MD)  (Signed 10-Mar-16 13:15)  Authored: Chief Complaint, VITAL SIGNS/ANCILLARY NOTES, Brief Assessment, Lab Results, Radiology Results, Assessment/Plan   Last Updated: 10-Mar-16 13:15 by [name] (MD)

## 2014-09-26 NOTE — Consult Note (Signed)
Chief Complaint:  Subjective/Chief Complaint Please see full Gi consult and brief consult note.  Patient seen and examined, chart reviewed.  Paitent admitted with severe anemia.  Recent colonoscopy negative for polyps or neoplasia.   Heme negative, uncertain etiology for IDA.  elevated haptoglobin noted.  No GI symptoms.  Will arrange for EGD tomorrow pm.  I have discussed the risks benefits dn complications of egd to include not limited to bleeding infection perforation and sedation and she wishes to proceed.   VITAL SIGNS/ANCILLARY NOTES: **Vital Signs.:   09-Mar-16 16:50  Vital Signs Type Routine  Temperature Temperature (F) 97.5  Celsius 36.3  Pulse Pulse 66  Respirations Respirations 18  Systolic BP Systolic BP 778  Diastolic BP (mmHg) Diastolic BP (mmHg) 71  Mean BP 101  Pulse Ox % Pulse Ox % 96  Pulse Ox Activity Level  At rest  Oxygen Delivery Room Air/ 21 %  *Intake and Output.:   09-Mar-16 10:08  Stool  LARGE FORMED HARF STOOL   Brief Assessment:  Cardiac Regular   Respiratory clear BS   Gastrointestinal details normal Soft  Nontender  Nondistended  No masses palpable  Bowel sounds normal   Lab Results: Hepatic:  08-Mar-16 11:15   Alkaline Phosphatase 65 (38-126 NOTE: New Reference Range  07/20/14)  Routine BB:  08-Mar-16 15:36   Direct Coombs, Polyspecific NEGATIVE (Result(s) reported on 03 Aug 2014 at 04:49PM.)  General Ref:  08-Mar-16 14:47   Haptoglobin ========== TEST NAME ==========  ========= RESULTS =========  = REFERENCE RANGE =  HAPTOGLOBIN  Haptoglobin Haptoglobin                     [H  444 mg/dL            ]            [address]               [facility]            No: [id number]           [address], [place], [place] [address]           Lindon Romp, MD         [phone]   Result(s) reported on 04 Aug 2014 at 08:23AM.  Erythropoietin, Serum ========== TEST NAME ==========  ========= RESULTS =========  = REFERENCE RANGE  =  ERYTHROPOIETIN (EPO)  Erythropoietin (EPO), Serum Erythropoietin                  [H  345.3 mIU/mL         ]          2.6-18.5               [facility]           No: [id number]           [address], [place], [place] [address]           Lindon Romp, MD         [phone]   Result(s) reported on 04 Aug 2014 at 08:23AM.  Routine Chem:  08-Mar-16 11:15   BUN 16 (6-20 NOTE: New Reference Range  07/20/14)  Iron, Serum  10 (28-170 NOTE: New Reference Range  07/20/14)    14:47   LDH, Serum 128 (98-192 NOTE: New Reference Range  07/20/14)  Routine Sero:  09-Mar-16 09:26   Occult Blood, Feces NEGATIVE (Result(s) reported on 04 Aug 2014  at 10:04AM.)  Routine Coag:  08-Mar-16 11:15   INR 1.2 (INR reference interval applies to patients on anticoagulant therapy. A single INR therapeutic range for coumarins is not optimal for all indications; however, the suggested range for most indications is 2.0 - 3.0. Exceptions to the INR Reference Range may include: Prosthetic heart valves, acute myocardial infarction, prevention of myocardial infarction, and combinations of aspirin and anticoagulant. The need for a higher or lower target INR must be assessed individually. Reference: The Pharmacology and Management of the Vitamin K  antagonists: the seventh ACCP Conference on Antithrombotic and Thrombolytic Therapy. KMMNO.1771 Sept:126 (3suppl): [id number]. A HCT value >55% may artifactually increase the PT.  In one study,  the increase was an average of 25%. Reference:  "Effect on Routine and Special Coagulation Testing Values of Citrate Anticoagulant Adjustment in Patients with High HCT Values." American Journal of Clinical Pathology 2006;126:400-405.)  Routine Hem:  08-Mar-16 11:15   MCV  64    14:47   Retic Count 2.39  Absolute Retic Count 0.0731 (Result(s) reported on 03 Aug 2014 at 04:19PM.)   Assessment/Plan:  Assessment/Plan:  Assessment as noted above.    Electronic Signatures: Loistine Simas (MD)  (Signed 09-Mar-16 18:25)  Authored: Chief Complaint, VITAL SIGNS/ANCILLARY NOTES, Brief Assessment, Lab Results, Assessment/Plan   Last Updated: 09-Mar-16 18:25 by Loistine Simas (MD)

## 2014-09-26 NOTE — Consult Note (Signed)
PATIENT NAME:  [name], [name] MR#:  [id number] DATE OF BIRTH:  [birth year]  DATE OF CONSULTATION:  08/04/2014  REFERRING PHYSICIAN:   CONSULTING PHYSICIAN:  Theodore Demark, NP  REASON FOR CONSULTATION: GI consult ordered by Dr. Ma Hillock for evaluation of iron deficiency anemia.   HISTORY OF PRESENT ILLNESS: Appreciate consult for a 81 year old Caucasian woman with history of chronic low back pain, osteoporosis, glaucoma, colon polyps, who was admitted for anemia, weakness, for evaluation of new iron deficiency anemia. Hemoglobin on admission was 5.8, received 2 units of packed red blood cells and has improved to 7.7. Hematology is following. Several tests have been ordered, haptoglobin was high, iron studies low, B12 normal. Serum protein electrophoresis pending. Had colonoscopy 11/15 without any polyps. No history of EGD. The patient reports unplanned weight loss of 20-25 pounds over the last several months and increasing fatigue and occasional constipation. Denies chest pain, shortness of breath. Denies abdominal pain, nausea, vomiting, diarrhea, melena, hematochezia, problems swallowing, and all other GI complaints. States she takes 81 mg of aspirin daily, but no other nonsteroidal anti-inflammatory drugs. Of note she had an occult blood test today that was negative.   REVIEW OF SYSTEMS:   Ten systems reviewed, unremarkable other than what is noted above.   PAST MEDICAL HISTORY: Hyperlipidemia, chronic low back pain, hypertension, glaucoma, foot pain, osteoporosis, colon polyps, internal hemorrhoids, lumbar diskectomy, hysterectomy, C-spine surgery, back surgery.   FAMILY HISTORY: Mother with heart disease, hypertension. Father with COPD.  Sister has diabetes. No family history of colorectal cancer, colon polyps, liver disease, or ulcers.   SOCIAL HISTORY: Lives alone. No tobacco, EtOH, illicits. Son lives nearby.   MEDICATIONS:  Raloxifene 60 mg by mouth once a day, ASA 81 mg once a  day, calcium 500 mg with D3 p.o. b.i.d., carvedilol 25 mg half tablet twice a day, Cardizem ER 240 mg once a day, gabapentin 400 mg 2 capsules at night, pravastatin 40 mg q.p.m., torsemide 20 mg p.r.n.   LABORATORY DATA:  Most recent laboratories, MET-B normal. Calcium 8.7. LDH 128. Ferritin 26, iron 10, iron saturation 3, reticulocyte count 2.39, B12 okay. Haptoglobin 444.  Erythropoietin  elevated. Total protein 7.4, albumin 3.0, total bilirubin 0.7. ALP 65, AST 13, ALT 11. TSH 1.001. WBC 8.5, hemoglobin 7.7, hematocrit 25.6, platelet count is 564,000, red cells macrocytic. PT 15.3, INR 1.2. CT chest, abdomen, and pelvis demonstrating thyroid nodule, mild hepatomegaly, pelvic sclerosis, right bladder base nodules, possible transitional cell carcinoma, no metastases, degenerative disk disease in the back.   PHYSICAL EXAMINATION:  VITAL SIGNS: Most recent vital signs, temperature 97.3, pulse 68, respiratory rate 18, blood pressure 137/64, SaO2 95% on room air.  GENERAL: Well-appearing woman in no acute distress.  HEENT: Normocephalic, atraumatic. Conjunctivae pink. Sclerae clear.  NECK: Supple. No thyromegaly, JVD, or lymphadenopathy.  CHEST: Respirations eupneic. Lungs clear.  CARDIAC: S1, S2, RRR. No MRG. No edema.  ABDOMEN: Flat. Bowel sounds x 4, nondistended, nontender. No guarding, rigidity, rebound tenderness, peritoneal signs, hepatosplenomegaly, masses, or other abnormalities.  SKIN: Warm, dry, pink. No erythema, lesion or rash.  EXTREMITIES: MAEW x 4. Gait steady. Strength 5 out of 5. No edema or cyanosis.  NEUROLOGIC: Alert, oriented x 3. Cranial nerves II through XII intact.  PSYCHIATRIC: Pleasant, calm, cooperative, logical train of thought.   IMPRESSION AND PLAN: Iron deficiency anemia, weight loss, has recent colonoscopy. Recommend esophagogastroduodenoscopy for luminal evaluation if clinically feasible. Discussed the indications, benefits, and risks including but not limited to  bleeding,  infection, perforation, difficulty with sedation with the patient, she is agreeable to the procedure. Further recommendations to follow. May need VCE.   Thank you very much for this consult.   These services were provided by Stephens November, MSN, [facility], in collaboration with Lollie Sails, MD, with whom I have discussed this patient in full.    ____________________________ Theodore Demark, NP chl:bu D: 08/04/2014 17:58:41 ET T: 08/04/2014 18:16:15 ET JOB#: [id number]  cc: Theodore Demark, NP, <Dictator> [place] SIGNED 08/12/2014 8:35

## 2014-09-26 NOTE — Consult Note (Signed)
PATIENT NAME:  [name], [name] MR#:  [id number] DATE OF BIRTH:  03-Nov-1932  DATE OF CONSULTATION:  08/03/2014  REFERRING PHYSICIAN:   Ennis Forts, MD  CONSULTING PHYSICIAN:   R. Ma Hillock, MD  REASON FOR CONSULTATION: Newly diagnosed severe anemia.   HISTORY OF PRESENT ILLNESS: The patient is a 81 year old female with past medical history significant for hypertension, hyperlipidemia, colon polyps status post recent repeat colonoscopy in 03/2014, which was negative for polyps and showed internal hemorrhoids, cervical spondylosis, who has been admitted to hospital with weakness and low hemoglobin or around 6, when she got labs checked by Dr. Ouida Sills as outpatient. The patient states that she has been feeling progressively tired and weak for the last 4 to 6 months. She also has noticed at least 20 to 25 pounds unintentional weight loss despite eating regularly. She denies any obvious bleeding symptoms including blood in stools, urine, hemoptysis, or hematemesis. She has chronic low back pain, states that she has had multiple back surgeries in the past. She has dyspnea on exertion; otherwise, denies any orthopnea, PND. No new cough, chest pain or hemoptysis. No fevers or night sweats; denies feeling any enlarged lymph nodes on self-exam.   PAST MEDICAL HISTORY: As in HPI above, in addition, has chronic foot pain, osteoporosis, history of colon polyps and internal hemorrhoids; lumbar diskectomy, hysterectomy decades ago, c-spine and back surgery x4, total of 6 back surgeries and spinal fusions.   FAMILY HISTORY: Remarkable for heart disease, hypertension, emphysema and diabetes.   SOCIAL HISTORY: Denies smoking, alcohol or recreational drug usage; states that she has been physically active up until recently.   ALLERGIES: INCLUDE KEFLEX.   HOME MEDICATIONS: Aspirin 81 mg daily, calcium plus vitamin D b.i.d., carvedilol 12.5 mg b.i.d., Cardizem 240 mg extended-release once daily,  raloxifene 60 mg once daily, gabapentin 400 mg 2 capsules at night, pravastatin 40 mg at bedtime, torsemide 20 once daily as needed.   REVIEW OF SYSTEMS: CONSTITUTIONAL: As in HPI, no fevers or chills, no night sweats.  HEENT: No headaches, epistaxis, ear or jaw pain. No sinus symptoms.  CARDIAC: As in HPI, no orthopnea or PND.  LUNGS: Denies any new cough, chest pain, or hemoptysis.  GASTROINTESTINAL: No nausea, vomiting, or diarrhea; no new constipation, or blood in stools, or melena.  GENITOURINARY: No dysuria, hematuria.  MUSCULOSKELETAL: Has low back pain which has been bothering more lately, otherwise, no new bone pains.  EXTREMITIES: As in HPI.  SKIN: No new rashes or pruritus.  HEMATOLOGIC: As in HPI.  NEUROLOGIC: No new focal weakness, seizures or loss of consciousness.  ENDOCRINE: No polyuria or polydipsia, having unintentional weight loss.   PHYSICAL EXAMINATION: GENERAL: The patient is a moderately built and nourished individual, resting in bed, alert and oriented and converses appropriately, no icterus, pallor present.  VITAL SIGNS:  Temperature 98.2, pulse 72, respirations 16, blood pressure 115/65, saturation 98% on room air.   HEENT: Normocephalic, atraumatic, extraocular movements intact, sclerae anicteric.  NECK: Negative for lymphadenopathy.  CARDIOVASCULAR: S1 and S2, regular.  LUNGS: Show bilateral good air entry, no crepitations or rhonchi noted.  ABDOMEN: Soft. No hepatosplenomegaly or masses clinically.  EXTREMITIES: Shows no major edema or cyanosis.  SKIN: No generalized rashes or major bruising.  NEUROLOGIC: Limited examination, cranial nerves are intact, moves all extremities spontaneously.  MUSCULOSKELETAL: No obvious joint redness or swelling.   LABORATORY RESULTS: WBC 8700, with 70% neutrophils, 18% lymphocytes, 10.5% monocytes; hemoglobin 5.8, MCV low at 64, platelets 583,000, creatinine 0.7, calcium  9.0, LFTs unremarkable, except low albumin of 3.0, low  ALT and AST levels; INR 1.2, iron studies pending at this time.   IMPRESSION AND RECOMMENDATIONS: The patient is elderly female patient with past medical history as described above admitted with progressive anemia symptoms for the last few months, low hemoglobin, with microcytosis, raising suspicion for iron deficiency anemia. She does not have any obvious bleeding issues; has history of colon polyps first, but recent colonoscopy 03/2014, was negative for any polyps and showed internal hemorrhoids only. Stool Hemoccult is pending, iron study also is pending.   We will get further anemia work-up including B12 and folate level; LDH/haptoglobin/direct Coombs test to rule out hemolysis, serum erythropoietin, serum and random urine protein immunoelectrophoresis to rule out plasma cell dyscrasia like myeloma, reticulocyte count, The patient also has remarkable unintentional weight loss despite eating well and no other obvious etiology or explanation for this weight loss. At this time, given this, we will also get a CT scan of the chest/abdomen/pelvis tomorrow to evaluate for any occult or metastatic malignancy. The patient was explained about this and she is agreeable.   We will follow up after above work-up results are available and make further plan of management. In the meantime, agree with ongoing supportive treatment and packed red blood cell transfusion as indicated by hemoglobin level and symptoms. The patient explained that if no obvious etiology for anemia is found, we will then consider bone marrow biopsy evaluation if indicated. She is agreeable to this plan.   Thank you for the referral. Please feel to contact me for additional questions.    ____________________________ Rhett Bannister Ma Hillock, MD srp:nt D: 08/03/2014 21:47:24 ET T: 08/03/2014 22:17:29 ET JOB#: [id number]  cc: Trevor Iha R. Ma Hillock, MD, <Dictator> Alveta Heimlich MD ELECTRONICALLY SIGNED 08/09/2014 11:31

## 2014-09-26 NOTE — Consult Note (Signed)
Chart reviewed (notes, labs, CT images) and discused with Dr. Anselm Jungling. Pt with symptomatic anemia. No gross hematuria or dysuria. Bun 13, cr 0.62. CT a/p - normal kidneys/ureters but an 11 mm papillary neoplasm near right bladder neck. Per GI note, recent C-scope negative.   A- bladder neoplasm on CT - given small size not likely to be related to current anemia especially with no gross hematuria.  Rec - - continue search for cause of anemia, bladder neoplasm likely incidental. -pt needs outpatient follow-up, [facility], for cystoscopy and exam, phone [phone].     Electronic Signatures: Georgette Dover (MD)  (Signed on 09-Mar-16 16:39)  Authored  Last Updated: 09-Mar-16 16:39 by Georgette Dover (MD)

## 2014-09-26 NOTE — Consult Note (Signed)
Brief Consult Note: Diagnosis: weakness and low hgb.   Patient was seen by consultant.   Consult note dictated.   Comments: Appreciate consult for 81 yo caucasian woman w/ hx chronic low back pain, osteoporosis, glaucoma, colon polyps, who was admitted with anemia/weakness, for evaluation of new IDA. hgb on adm was 5.8: received 2u prbc and has improved to 7.7. Hematology following, several tests ordered- haptoglobin high, Fe studies low. B12 noraml. SPEP pending. Had colonoscopy 11/15 without any polyps. No hx EGD. Patient reports unplanned weight loss of 20-25lb over the last several months and increasing fatigue, occasional constipation. Denies chest pain/sob. Denies abdominal pain, NVD, melena/ hematochezia, problems swallowing, and all other GI complaints. States she takes 81mg  po ASA but not other nsaids. Of note, she was heme negative. Impression and plan. IDA. weight loss. Recent colonoscopy. Recommend EGD for luminal evaluation as clinically feasible- discussed the indications, benefits and risks- including but not limited to bleeding/infection/perforation/difficulty with sedation, with patient and she is agreeable to the procedure. Further recommendations to follow, may need VCE. Will discuss further with Dr Colman Cater.  Electronic Signatures: Stephens November H (NP)  (Signed 09-Mar-16 17:18)  Authored: Brief Consult Note   Last Updated: 09-Mar-16 17:18 by Theodore Demark (NP)

## 2014-09-29 ENCOUNTER — Ambulatory Visit
Admission: RE | Admit: 2014-09-29 | Discharge: 2014-09-29 | Disposition: A | Discharge status: Home / Self Care | Payer: Medicare Other | Source: Ambulatory Visit | Attending: Urology | Admitting: Urology

## 2014-09-29 ENCOUNTER — Ambulatory Visit: Payer: Medicare Other | Admitting: *Deleted

## 2014-09-29 ENCOUNTER — Encounter: Payer: Self-pay | Admitting: *Deleted

## 2014-09-29 ENCOUNTER — Encounter
Admission: RE | Disposition: A | Discharge status: Home / Self Care | Payer: Self-pay | Source: Ambulatory Visit | Attending: Urology

## 2014-09-29 DIAGNOSIS — G8929 Other chronic pain: Secondary | ICD-10-CM | POA: Diagnosis not present

## 2014-09-29 DIAGNOSIS — C679 Malignant neoplasm of bladder, unspecified: Secondary | ICD-10-CM | POA: Diagnosis not present

## 2014-09-29 DIAGNOSIS — M545 Low back pain: Secondary | ICD-10-CM | POA: Diagnosis not present

## 2014-09-29 DIAGNOSIS — Z86010 Personal history of colonic polyps: Secondary | ICD-10-CM | POA: Diagnosis not present

## 2014-09-29 DIAGNOSIS — E785 Hyperlipidemia, unspecified: Secondary | ICD-10-CM | POA: Insufficient documentation

## 2014-09-29 DIAGNOSIS — D649 Anemia, unspecified: Secondary | ICD-10-CM | POA: Insufficient documentation

## 2014-09-29 DIAGNOSIS — M810 Age-related osteoporosis without current pathological fracture: Secondary | ICD-10-CM | POA: Insufficient documentation

## 2014-09-29 DIAGNOSIS — I10 Essential (primary) hypertension: Secondary | ICD-10-CM | POA: Diagnosis not present

## 2014-09-29 DIAGNOSIS — D494 Neoplasm of unspecified behavior of bladder: Secondary | ICD-10-CM | POA: Diagnosis present

## 2014-09-29 HISTORY — PX: CYSTOSCOPY W/ RETROGRADES: SHX1426

## 2014-09-29 HISTORY — PX: TRANSURETHRAL RESECTION OF BLADDER TUMOR: SHX2575

## 2014-09-29 LAB — CBC
HCT: 27.1 % — ABNORMAL LOW (ref 35.0–47.0)
Hemoglobin: 8.3 g/dL — ABNORMAL LOW (ref 12.0–16.0)
MCH: 24.1 pg — ABNORMAL LOW (ref 26.0–34.0)
MCHC: 30.6 g/dL — AB (ref 32.0–36.0)
MCV: 78.8 fL — AB (ref 80.0–100.0)
Platelets: 355 10*3/uL (ref 150–440)
RBC: 3.45 MIL/uL — ABNORMAL LOW (ref 3.80–5.20)
RDW: 24.9 % — AB (ref 11.5–14.5)
WBC: 7.2 10*3/uL (ref 3.6–11.0)

## 2014-09-29 SURGERY — TURBT (TRANSURETHRAL RESECTION OF BLADDER TUMOR)
Anesthesia: General

## 2014-09-29 MED ORDER — PROMETHAZINE HCL 25 MG/ML IJ SOLN: 6.2500 mg | INTRAMUSCULAR | Status: DC | PRN

## 2014-09-29 MED ORDER — LACTATED RINGERS IV SOLN
INTRAVENOUS | Status: DC
  Administered 2014-09-29 (×2): via INTRAVENOUS

## 2014-09-29 MED ORDER — HYDROCODONE-ACETAMINOPHEN 5-325 MG PO TABS: 1.0000 | ORAL_TABLET | Freq: Four times a day (QID) | ORAL | Status: DC | PRN

## 2014-09-29 MED ORDER — LEVOFLOXACIN IN D5W 500 MG/100ML IV SOLN
INTRAVENOUS | Status: AC
  Filled 2014-09-29: qty 100

## 2014-09-29 MED ORDER — IOTHALAMATE MEGLUMINE 43 % IV SOLN
INTRAVENOUS | Status: DC | PRN
  Administered 2014-09-29: 15 mL

## 2014-09-29 MED ORDER — PHENAZOPYRIDINE HCL 200 MG PO TABS: 200.0000 mg | ORAL_TABLET | Freq: Three times a day (TID) | ORAL | Status: DC | PRN

## 2014-09-29 MED ORDER — PROPOFOL 10 MG/ML IV BOLUS
INTRAVENOUS | Status: DC | PRN
  Administered 2014-09-29: 200 mg via INTRAVENOUS

## 2014-09-29 MED ORDER — LIDOCAINE HCL (CARDIAC) 20 MG/ML IV SOLN
INTRAVENOUS | Status: DC | PRN
  Administered 2014-09-29: 100 mg via INTRAVENOUS

## 2014-09-29 MED ORDER — HYDROMORPHONE HCL 1 MG/ML IJ SOLN: 0.2500 mg | INTRAMUSCULAR | Status: DC | PRN

## 2014-09-29 MED ORDER — FAMOTIDINE 20 MG PO TABS
20.0000 mg | ORAL_TABLET | Freq: Once | ORAL | Status: AC
  Administered 2014-09-29: 20 mg via ORAL

## 2014-09-29 MED ORDER — GLYCOPYRROLATE 0.2 MG/ML IJ SOLN
INTRAMUSCULAR | Status: DC | PRN
  Administered 2014-09-29: 0.2 mg via INTRAVENOUS

## 2014-09-29 MED ORDER — FAMOTIDINE 20 MG PO TABS
ORAL_TABLET | ORAL | Status: AC
  Filled 2014-09-29: qty 1

## 2014-09-29 MED ORDER — STERILE WATER FOR IRRIGATION IR SOLN
Status: DC | PRN
  Administered 2014-09-29: 3000 mL

## 2014-09-29 MED ORDER — MEPERIDINE HCL 25 MG/ML IJ SOLN: 6.2500 mg | INTRAMUSCULAR | Status: DC | PRN

## 2014-09-29 MED ORDER — FENTANYL CITRATE (PF) 100 MCG/2ML IJ SOLN
INTRAMUSCULAR | Status: DC | PRN
  Administered 2014-09-29: 25 ug via INTRAVENOUS
  Administered 2014-09-29: 100 ug via INTRAVENOUS
  Administered 2014-09-29: 25 ug via INTRAVENOUS

## 2014-09-29 MED ORDER — HYDROCODONE-ACETAMINOPHEN 7.5-325 MG PO TABS: 1.0000 | ORAL_TABLET | Freq: Once | ORAL | Status: DC | PRN

## 2014-09-29 MED ORDER — ONDANSETRON HCL 4 MG/2ML IJ SOLN
INTRAMUSCULAR | Status: DC | PRN
  Administered 2014-09-29: 4 mg via INTRAVENOUS

## 2014-09-29 MED ORDER — LEVOFLOXACIN IN D5W 500 MG/100ML IV SOLN
500.0000 mg | Freq: Once | INTRAVENOUS | Status: AC
Start: 2014-09-29 — End: 2014-09-29
  Administered 2014-09-29: 500 mg via INTRAVENOUS

## 2014-09-29 SURGICAL SUPPLY — 46 items
BAG DRAIN CYSTO-URO LG1000N (MISCELLANEOUS) ×6 IMPLANT
BAG URO DRAIN 2000ML W/SPOUT (MISCELLANEOUS) ×2 IMPLANT
BASKET STONE 3X4X90X20 (MISCELLANEOUS) ×2 IMPLANT
BSKT STON RTRVL 90X20 3FR (MISCELLANEOUS) ×2
CATH FOLEY 2WAY  5CC 16FR (CATHETERS) ×2
CATH FOLEY 2WAY 5CC 16FR (CATHETERS) ×2
CATH URETL 5X70 OPEN END (CATHETERS) ×6 IMPLANT
CATH URTH 16FR FL 2W BLN LF (CATHETERS) ×2 IMPLANT
CONRAY 43 FOR UROLOGY 50M (MISCELLANEOUS) ×6 IMPLANT
CORD URO TURP 10FT (MISCELLANEOUS) ×2 IMPLANT
ELECT LOOP MED HF 24F 12D (CUTTING LOOP) ×4 IMPLANT
ELECT URO BUGBEE 5F (MISCELLANEOUS) ×4
ELECTRODE URO BUGBEE 5F (MISCELLANEOUS) ×2 IMPLANT
GLOVE BIO SURGEON STRL SZ 6.5 (GLOVE) ×6 IMPLANT
GLOVE BIO SURGEON STRL SZ7 (GLOVE) ×8 IMPLANT
GLOVE BIO SURGEON STRL SZ7.5 (GLOVE) ×6 IMPLANT
GLOVE BIO SURGEONS STRL SZ 6.5 (GLOVE) ×2
GOWN STRL REUS W/ TWL LRG LVL3 (GOWN DISPOSABLE) ×8 IMPLANT
GOWN STRL REUS W/TWL LRG LVL3 (GOWN DISPOSABLE) ×8
IRRIGATION STRYKERFLOW (MISCELLANEOUS) ×2 IMPLANT
IRRIGATOR STRYKERFLOW (MISCELLANEOUS) ×4
JELLY LUB 2OZ STRL (MISCELLANEOUS) ×2
JELLY LUBE 2OZ STRL (MISCELLANEOUS) ×4 IMPLANT
LOOP CUT RT ANGL 28F (MISCELLANEOUS) ×2 IMPLANT
NDL SAFETY ECLIPSE 18X1.5 (NEEDLE) IMPLANT
NEEDLE HYPO 18GX1.5 SHARP (NEEDLE) ×4
PACK CYSTO AR (MISCELLANEOUS) ×6 IMPLANT
PAD GROUND ADULT SPLIT (MISCELLANEOUS) ×2 IMPLANT
PAD TELFA 2X3 NADH STRL (GAUZE/BANDAGES/DRESSINGS) ×2 IMPLANT
PREP PVP WINGED SPONGE (MISCELLANEOUS) ×6 IMPLANT
PUMP SINGLE ACTION SAP (PUMP) ×2 IMPLANT
ROLLER BALL 3MM 24FR (ELECTROSURGICAL) ×2 IMPLANT
SENSORWIRE 0.038 NOT ANGLED (WIRE) ×4
SET CYSTO W/LG BORE CLAMP LF (SET/KITS/TRAYS/PACK) ×6 IMPLANT
SET IRRIG Y TYPE TUR BLADDER L (SET/KITS/TRAYS/PACK) ×4 IMPLANT
SOL .9 NS 3000ML IRR  AL (IV SOLUTION)
SOL .9 NS 3000ML IRR AL (IV SOLUTION)
SOL .9 NS 3000ML IRR UROMATIC (IV SOLUTION) ×4 IMPLANT
SOL PREP PVP 2OZ (MISCELLANEOUS)
SOLUTION PREP PVP 2OZ (MISCELLANEOUS) ×2 IMPLANT
STENT URET 6FRX24 CONTOUR (STENTS) ×2 IMPLANT
STENT URET 6FRX26 CONTOUR (STENTS) ×2 IMPLANT
SYRINGE IRR TOOMEY STRL 70CC (SYRINGE) ×6 IMPLANT
WATER STERILE IRR 1000ML POUR (IV SOLUTION) ×6 IMPLANT
WATER STERILE IRR 3000ML UROMA (IV SOLUTION) ×8 IMPLANT
WIRE SENSOR 0.038 NOT ANGLED (WIRE) ×6 IMPLANT

## 2014-09-29 NOTE — Discharge Instructions (Signed)
Transurethral Resection of Bladder Tumor (TURBT) or Bladder Biopsy   Definition:  Transurethral Resection of the Bladder Tumor is a surgical procedure used to diagnose and remove tumors within the bladder. TURBT is the most common treatment for early stage bladder cancer.  General instructions:     Your recent bladder surgery requires very little post hospital care but some definite precautions.  Despite the fact that no skin incisions were used, the area around the bladder incisions are raw and covered with scabs to promote healing and prevent bleeding. Certain precautions are needed to insure that the scabs are not disturbed over the next 2-4 weeks while the healing proceeds.  Because the raw surface inside your bladder and the irritating effects of urine you may expect frequency of urination and/or urgency (a stronger desire to urinate) and perhaps even getting up at night more often. This will usually resolve or improve slowly over the healing period. You may see some blood in your urine over the first 6 weeks. Do not be alarmed, even if the urine was clear for a while. Get off your feet and drink lots of fluids until clearing occurs. If you start to pass clots or don't improve call us.  Diet:  You may return to your normal diet immediately. Because of the raw surface of your bladder, alcohol, spicy foods, foods high in acid and drinks with caffeine may cause irritation or frequency and should be used in moderation. To keep your urine flowing freely and avoid constipation, drink plenty of fluids during the day (8-10 glasses). Tip: Avoid cranberry juice because it is very acidic.  Activity:  Your physical activity doesn't need to be restricted. However, if you are very active, you may see some blood in the urine. We suggest that you reduce your activity under the circumstances until the bleeding has stopped.  Bowels:  It is important to keep your bowels regular during the postoperative  period. Straining with bowel movements can cause bleeding. A bowel movement every other day is reasonable. Use a mild laxative if needed, such as milk of magnesia 2-3 tablespoons, or 2 Dulcolax tablets. Call if you continue to have problems. If you had been taking narcotics for pain, before, during or after your surgery, you may be constipated. Take a laxative if necessary.    Medication:  You should resume your pre-surgery medications unless told not to. In addition you may be given an antibiotic to prevent or treat infection. Antibiotics are not always necessary. All medication should be taken as prescribed until the bottles are finished unless you are having an unusual reaction to one of the drugs.   [facility] [address], [place], [place] [address] [phone]    Friday May 13 at 9:45

## 2014-09-29 NOTE — Anesthesia Postprocedure Evaluation (Addendum)
  Anesthesia Post-op Note  Patient: [name]  Procedure(s) Performed: Procedure(s): TRANSURETHRAL RESECTION OF BLADDER TUMOR (TURBT) (N/A) CYSTOSCOPY WITH RETROGRADE PYELOGRAM (Bilateral)  Anesthesia type:General  Patient location: PACU  Post pain: Pain level controlled  Post assessment: Post-op Vital signs reviewed, Patient's Cardiovascular Status Stable, Respiratory Function Stable, Patent Airway and No signs of Nausea or vomiting  Post vital signs: Reviewed and stable  Last Vitals:  Filed Vitals:   09/29/14 1447  BP: 147/66  Pulse: 68  Temp:   Resp: 17    Level of consciousness: awake, alert  and patient cooperative  Complications: No apparent anesthesia complications Hb 3.0--NMMHWK.

## 2014-09-29 NOTE — Op Note (Addendum)
Date of procedure: 09/29/2014  Preoperative diagnosis:  1. Bladder mass   Postoperative diagnosis:  1. Bladder tumor   Procedure: 1. Cystoscopy 2. Bilateral retrograde pyelograms 3. TURBT (medium 2 cm tumor)  Surgeon: Hollice Espy, MD  Anesthesia: General  Complications: None  Intraoperative findings: Spherical 2 cm tumor located just proximal to right UO the bladder neck.  EBL: Minimal  Specimens: Bladder tumor, deep bladder base  Drains: None  Indication: [name] is a 81 y.o. patient with an incidental bladder nodule noted on CT abdomen and pelvis.  Given the concern for a bladder tumor based on imaging, she was counseled to proceed to the operating room for possible TURBT with bilateral retrograde pyelogram.  After reviewing the management options for treatment, she elected to proceed with the above surgical procedure(s). We have discussed the potential benefits and risks of the procedure, side effects of the proposed treatment, the likelihood of the patient achieving the goals of the procedure, and any potential problems that might occur during the procedure or recuperation. Informed consent has been obtained.  Description of procedure:  The patient was taken to the operating room and general anesthesia was induced.  The patient was placed in the dorsal lithotomy position, prepped and draped in the usual sterile fashion, and preoperative antibiotics were administered. A preoperative time-out was performed.   At this time, a 65 French rigid cystoscope was advanced per urethra into the bladder. The bladder was then carefully inspected. A 2 cm extremely spherical bladder tumor was noted just proximal to the right ureteral orifice on a fairly narrow stalk. This tumor was not at all papillary in nature and didn't appear to be covered in normal bladder mucosa. The remainder of the bladder was clear of any lesions or tumors or stones. The right ureteral orifice was  clearly identified and cannulated using a 5 [place] open-ended ureteral catheter. A retrograde pyelogram was then performed revealing a delicate appearing ureter as well as a normal upper tract collecting system without evidence of hydronephrosis or filling defects. The same exact procedure was performed on the left side without any evidence of filling defects or hydronephrosis. The ureter itself was also normal.  Attention was then turned to the resection of the tumor. Given the fairly narrow stalk, I did use a cold cup biopsy forceps to grasp the tumor near the base and avulse it from the bladder. I then used the cold cup biopsy forceps to take a bigger deeper sample of the base of the bladder mucosa where the tumor had been which was passed off as deep bladder tumor base. The tumor was free floating in the bladder at this point. I tried to pull it out of the bladder in one piece using the cold cup forceps but was unsuccessful. I ultimately was able to lasso the tumor using a 1.9 [place] nitinol tip less basket and bring out the tumor in one large piece to the urethral meatus.  This was passed off the field and labeled as bladder tumor.  The base of the tumor bed was then fulgurated using a Bugbee. The water was turned off and there was no active bleeding noted.  The UOs were then carefully reinspected and the resection site was a good distance from the orifice.  The bladder was then drained. The patient was then repositioned from the supine position, reversed from anesthesia, and taken to the PACU in stable condition. There were no complications in this case.  Hollice Espy, M.D.

## 2014-09-29 NOTE — Transfer of Care (Signed)
Immediate Anesthesia Transfer of Care Note  Patient: [name]  Procedure(s) Performed: Procedure(s): TRANSURETHRAL RESECTION OF BLADDER TUMOR (TURBT) (N/A) CYSTOSCOPY WITH RETROGRADE PYELOGRAM (Bilateral)  Patient Location: PACU  Anesthesia Type:General  Level of Consciousness: sedated  Airway & Oxygen Therapy: Patient Spontanous Breathing and Patient connected to nasal cannula oxygen  Post-op Assessment: Report given to RN and Post -op Vital signs reviewed and stable  Post vital signs: Reviewed and stable  Last Vitals:  Filed Vitals:   09/29/14 1417  BP: 135/64  Pulse:   Temp:   Resp:     Complications: No apparent anesthesia complications

## 2014-09-29 NOTE — H&P (Signed)
See paper H&P 

## 2014-09-29 NOTE — Brief Op Note (Signed)
09/29/2014  2:31 PM  PATIENT:  [name]  81 y.o. female  PRE-OPERATIVE DIAGNOSIS:  BLADDER MASS  POST-OPERATIVE DIAGNOSIS:  BLADDER MASS  PROCEDURE:  Procedure(s): TRANSURETHRAL RESECTION OF BLADDER TUMOR (TURBT) (N/A) CYSTOSCOPY WITH RETROGRADE PYELOGRAM (Bilateral)  SURGEON:  Surgeon(s) and Role:    * Hollice Espy, MD - Primary  ASSISTANTS: none   ANESTHESIA:   general  EBL:  Total I/O In: 300 [I.V.:300] Out: -   DRAINS: none  SPECIMEN:  Bladder tumor, deep bladder tumor base  COUNTS:  YES  PLAN OF CARE: Discharge to home after PACU  PATIENT DISPOSITION:  PACU - hemodynamically stable.

## 2014-09-29 NOTE — Anesthesia Postprocedure Evaluation (Deleted)
  Anesthesia Post-op Note  Patient: [name]  Procedure(s) Performed: Procedure(s): TRANSURETHRAL RESECTION OF BLADDER TUMOR (TURBT) (N/A) CYSTOSCOPY WITH RETROGRADE PYELOGRAM (Bilateral)  Anesthesia type:General  Patient location: PACU  Post pain: Pain level controlled  Post assessment: Post-op Vital signs reviewed, Patient's Cardiovascular Status Stable, Respiratory Function Stable, Patent Airway and No signs of Nausea or vomiting  Post vital signs: Reviewed and stable  Last Vitals:  Filed Vitals:   09/29/14 1447  BP: 147/66  Pulse: 68  Temp:   Resp: 17    Level of consciousness: awake, alert  and patient cooperative  Complications: No apparent anesthesia complications

## 2014-09-29 NOTE — Anesthesia Preprocedure Evaluation (Signed)
Anesthesia Evaluation  Patient identified by MRN, date of birth, ID band Patient awake    Reviewed: Allergy & Precautions, NPO status , Patient's Chart, lab work & pertinent test results  Airway Mallampati: III  TM Distance: >3 FB Neck ROM: Full    Dental  (+) Teeth Intact   Pulmonary neg pulmonary ROS,  breath sounds clear to auscultation        Cardiovascular Exercise Tolerance: Poor hypertension, Rhythm:Regular Rate:Normal     Neuro/Psych negative neurological ROS  negative psych ROS   GI/Hepatic PUD,   Endo/Other    Renal/GU      Musculoskeletal  (+) Arthritis -, Osteoarthritis,    Abdominal   Peds  Hematology  (+) anemia , Significant anemia, and has been transfused, has hx of peptic ulcers, followed by Dr Donnella Sham.   Anesthesia Other Findings   Reproductive/Obstetrics                             Anesthesia Physical Anesthesia Plan  ASA: III  Anesthesia Plan: General   Post-op Pain Management:    Induction:   Airway Management Planned: LMA  Additional Equipment:   Intra-op Plan:   Post-operative Plan: Extubation in OR  Informed Consent: I have reviewed the patients History and Physical, chart, labs and discussed the procedure including the risks, benefits and alternatives for the proposed anesthesia with the patient or authorized representative who has indicated his/her understanding and acceptance.     Plan Discussed with: CRNA  Anesthesia Plan Comments:         Anesthesia Quick Evaluation

## 2014-09-29 NOTE — Interval H&P Note (Signed)
History and Physical Interval Note:  09/29/2014 12:58 PM  [name]  has presented today for surgery, with the diagnosis of BLADDER MASS  The various methods of treatment have been discussed with the patient and family. After consideration of risks, benefits and other options for treatment, the patient has consented to  Procedure(s): TRANSURETHRAL RESECTION OF BLADDER TUMOR (TURBT) (N/A) CYSTOSCOPY WITH RETROGRADE PYELOGRAM (Bilateral) as a surgical intervention .  The patient's history has been reviewed, patient examined, no change in status, stable for surgery.  I have reviewed the patient's chart and labs.  Questions were answered to the patient's satisfaction.     Hollice Espy

## 2014-09-29 NOTE — Anesthesia Postprocedure Evaluation (Signed)
  Anesthesia Post-op Note  Patient: [name]  Procedure(s) Performed: Procedure(s): TRANSURETHRAL RESECTION OF BLADDER TUMOR (TURBT) (N/A) CYSTOSCOPY WITH RETROGRADE PYELOGRAM (Bilateral)  Anesthesia type:General  Patient location: PACU  Post pain: Pain level controlled  Post assessment: Post-op Vital signs reviewed, Patient's Cardiovascular Status Stable, Respiratory Function Stable, Patent Airway and No signs of Nausea or vomiting  Post vital signs: Reviewed and stable  Last Vitals:  Filed Vitals:   09/29/14 1447  BP: 147/66  Pulse: 68  Temp:   Resp: 17    Level of consciousness: awake, alert  and patient cooperative  Complications: No apparent anesthesia complications

## 2014-10-04 ENCOUNTER — Encounter: Payer: Self-pay | Admitting: Urology

## 2014-10-07 ENCOUNTER — Other Ambulatory Visit: Payer: Self-pay | Admitting: Gastroenterology

## 2014-10-07 DIAGNOSIS — D509 Iron deficiency anemia, unspecified: Secondary | ICD-10-CM

## 2014-10-08 LAB — SURGICAL PATHOLOGY

## 2014-10-20 ENCOUNTER — Ambulatory Visit
Admission: RE | Admit: 2014-10-20 | Discharge: 2014-10-20 | Disposition: A | Discharge status: Home / Self Care | Payer: Medicare Other | Source: Ambulatory Visit | Attending: Gastroenterology | Admitting: Gastroenterology

## 2014-10-20 DIAGNOSIS — Z981 Arthrodesis status: Secondary | ICD-10-CM | POA: Insufficient documentation

## 2014-10-20 DIAGNOSIS — D509 Iron deficiency anemia, unspecified: Secondary | ICD-10-CM | POA: Diagnosis present

## 2014-10-25 ENCOUNTER — Other Ambulatory Visit: Payer: Self-pay | Admitting: *Deleted

## 2014-10-25 DIAGNOSIS — D649 Anemia, unspecified: Secondary | ICD-10-CM

## 2014-10-26 ENCOUNTER — Encounter
Admission: RE | Admit: 2014-10-26 | Discharge: 2014-10-26 | Disposition: A | Discharge status: Home / Self Care | Payer: Medicare Other | Source: Ambulatory Visit | Attending: Urology | Admitting: Urology

## 2014-10-26 ENCOUNTER — Inpatient Hospital Stay: Payer: Medicare Other | Attending: Internal Medicine

## 2014-10-26 DIAGNOSIS — Z01812 Encounter for preprocedural laboratory examination: Secondary | ICD-10-CM | POA: Diagnosis not present

## 2014-10-26 DIAGNOSIS — K2961 Other gastritis with bleeding: Secondary | ICD-10-CM | POA: Insufficient documentation

## 2014-10-26 DIAGNOSIS — Z01810 Encounter for preprocedural cardiovascular examination: Secondary | ICD-10-CM | POA: Diagnosis not present

## 2014-10-26 DIAGNOSIS — C679 Malignant neoplasm of bladder, unspecified: Secondary | ICD-10-CM | POA: Insufficient documentation

## 2014-10-26 DIAGNOSIS — D649 Anemia, unspecified: Secondary | ICD-10-CM

## 2014-10-26 DIAGNOSIS — D500 Iron deficiency anemia secondary to blood loss (chronic): Secondary | ICD-10-CM | POA: Insufficient documentation

## 2014-10-26 LAB — URINALYSIS COMPLETE WITH MICROSCOPIC (ARMC ONLY)
BILIRUBIN URINE: NEGATIVE
Bacteria, UA: NONE SEEN
Glucose, UA: NEGATIVE mg/dL
HGB URINE DIPSTICK: NEGATIVE
NITRITE: NEGATIVE
PH: 5 (ref 5.0–8.0)
Protein, ur: 30 mg/dL — AB
Specific Gravity, Urine: 1.028 (ref 1.005–1.030)

## 2014-10-26 LAB — CBC WITH DIFFERENTIAL/PLATELET
BASOS ABS: 0.1 10*3/uL (ref 0–0.1)
BASOS PCT: 1 %
EOS ABS: 0.1 10*3/uL (ref 0–0.7)
Eosinophils Relative: 1 %
HCT: 28 % — ABNORMAL LOW (ref 35.0–47.0)
Hemoglobin: 8.6 g/dL — ABNORMAL LOW (ref 12.0–16.0)
LYMPHS ABS: 1.1 10*3/uL (ref 1.0–3.6)
Lymphocytes Relative: 14 %
MCH: 24 pg — AB (ref 26.0–34.0)
MCHC: 30.6 g/dL — AB (ref 32.0–36.0)
MCV: 78.4 fL — ABNORMAL LOW (ref 80.0–100.0)
Monocytes Absolute: 0.7 10*3/uL (ref 0.2–0.9)
Monocytes Relative: 8 %
Neutro Abs: 6.3 10*3/uL (ref 1.4–6.5)
Neutrophils Relative %: 76 %
PLATELETS: 370 10*3/uL (ref 150–440)
RBC: 3.58 MIL/uL — AB (ref 3.80–5.20)
RDW: 20.8 % — ABNORMAL HIGH (ref 11.5–14.5)
WBC: 8.2 10*3/uL (ref 3.6–11.0)

## 2014-10-26 LAB — IRON AND TIBC
Iron: 16 ug/dL — ABNORMAL LOW (ref 28–170)
Saturation Ratios: 6 % — ABNORMAL LOW (ref 10.4–31.8)
TIBC: 256 ug/dL (ref 250–450)
UIBC: 240 ug/dL

## 2014-10-26 LAB — FERRITIN: FERRITIN: 84 ng/mL (ref 11–307)

## 2014-10-26 NOTE — Patient Instructions (Addendum)
  Your procedure is scheduled on: Tuesday 11/02/2014 Report to Day Surgery.  Medical mall Entrance To find out your arrival time please call [phone] between 1PM - 3PM on Monday 11/01/2014.  Remember: Instructions that are not followed completely may result in serious medical risk, up to and including death, or upon the discretion of your surgeon and anesthesiologist your surgery may need to be rescheduled.    _x__ 1. Do not eat food or drink liquids after midnight. No gum chewing or hard candies.     _x__ 2. No Alcohol for 24 hours before or after surgery.   ____ 3. Bring all medications with you on the day of surgery if instructed.    _x__ 4. Notify your doctor if there is any change in your medical condition     (cold, fever, infections).     Do not wear jewelry, make-up, hairpins, clips or nail polish.  Do not wear lotions, powders, or perfumes. You may wear deodorant.  Do not shave 48 hours prior to surgery. Men may shave face and neck.  Do not bring valuables to the hospital.    [facility] is not responsible for any belongings or valuables.               Contacts, dentures or bridgework may not be worn into surgery.  Leave your suitcase in the car. After surgery it may be brought to your room.  For patients admitted to the hospital, discharge time is determined by your                treatment team.   Patients discharged the day of surgery will not be allowed to drive home.   Please read over the following fact sheets that you were given:   Surgical Site Infection Prevention   _x___ Take these medicines the morning of surgery with A SIP OF WATER:    1. Diltiazem  2. Carvedilol  3.   4.  5.  6.  ____ Fleet Enema (as directed)   ____ Use CHG Soap as directed  ____ Use inhalers on the day of surgery  ____ Stop metformin 2 days prior to surgery    ____ Take 1/2 of usual insulin dose the night before surgery and none on the morning of surgery.   ____ Stop  Coumadin/Plavix/aspirin on   ____ Stop Anti-inflammatories on    ____ Stop supplements until after surgery.    ____ Bring C-Pap to the hospital.

## 2014-10-28 LAB — URINE CULTURE
Culture: NO GROWTH
SPECIAL REQUESTS: NORMAL

## 2014-10-28 NOTE — OR Nursing (Signed)
Urine and Culture results faxed to Dr. Erlene Quan

## 2014-10-29 ENCOUNTER — Encounter (INDEPENDENT_AMBULATORY_CARE_PROVIDER_SITE_OTHER): Payer: Self-pay

## 2014-10-29 ENCOUNTER — Inpatient Hospital Stay: Payer: Medicare Other | Attending: Internal Medicine | Admitting: Internal Medicine

## 2014-10-29 VITALS — BP 149/60 | HR 52 | Temp 95.9°F | Resp 18 | Ht 61.0 in | Wt 151.0 lb

## 2014-10-29 DIAGNOSIS — E785 Hyperlipidemia, unspecified: Secondary | ICD-10-CM | POA: Diagnosis not present

## 2014-10-29 DIAGNOSIS — Z7982 Long term (current) use of aspirin: Secondary | ICD-10-CM | POA: Diagnosis not present

## 2014-10-29 DIAGNOSIS — Z79899 Other long term (current) drug therapy: Secondary | ICD-10-CM

## 2014-10-29 DIAGNOSIS — D509 Iron deficiency anemia, unspecified: Secondary | ICD-10-CM | POA: Diagnosis present

## 2014-10-29 DIAGNOSIS — M5136 Other intervertebral disc degeneration, lumbar region: Secondary | ICD-10-CM

## 2014-10-29 DIAGNOSIS — M5030 Other cervical disc degeneration, unspecified cervical region: Secondary | ICD-10-CM

## 2014-10-29 DIAGNOSIS — I10 Essential (primary) hypertension: Secondary | ICD-10-CM | POA: Diagnosis not present

## 2014-10-29 DIAGNOSIS — E079 Disorder of thyroid, unspecified: Secondary | ICD-10-CM

## 2014-10-29 DIAGNOSIS — M810 Age-related osteoporosis without current pathological fracture: Secondary | ICD-10-CM

## 2014-10-29 DIAGNOSIS — R634 Abnormal weight loss: Secondary | ICD-10-CM

## 2014-10-29 DIAGNOSIS — C679 Malignant neoplasm of bladder, unspecified: Secondary | ICD-10-CM

## 2014-10-29 DIAGNOSIS — Z8719 Personal history of other diseases of the digestive system: Secondary | ICD-10-CM | POA: Diagnosis not present

## 2014-11-01 ENCOUNTER — Other Ambulatory Visit: Payer: Self-pay

## 2014-11-01 NOTE — H&P (Signed)
[name] 10/08/2014 8:17 AM Location: [facility] Patient #: [id number] DOB: Oct 17, 1932 Widowed / Language: Cleophus Hahn / Race: White Female    History of Present [name], MD; 10/08/2014 4:58 PM) The patient is a 81 year old female who presents with bladder cancer. Note for "Bladder cancer": 81 year old nonsmoker who presents today for further evaluation of an incidental bladder nodule identified on CT scan abdomen and pelvis with contrast which showed an incidental 11 mm papillary neoplasm near the RIGHT bladder neck.   She was taken to the operating room on 09/29/2014 for TURBT, Bilateral retrograde pyelogram at which time a 2 cm extremely spherical mass was identified just proximal to the RIGHT UO. This was resected along with deeper biopsies of the base. She presents today for pathology review. Of note, bilateral rretrograde pyelogram was negative for any obvious upper tract filling defects or hydronephrosis.  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 The tumor had an inverted architecture. Deeper biopsies did contain muscle after speaking with the pathologist today and the muscle was negative for any tumor. Additonally, CIS was noted.  No postoperative issues. She did have some dysuria lasting a day or 2, but nourinary symptoms or complaints since then.  She denies a history of smoking or second hand smoking exposure. 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 She denies ever being exposed to textile dyes or any other chemicals.  CT abd/ pelvis 08/04/14 CT ABDOMEN AND PELVIS IMPRESSION 1. Right bladder base nodule, highly suspicious for transitional cell carcinoma. These results will be called to the ordering clinician or representative by the Radiologist Assistant, and communication documented in the PACS or zVision Dashboard. 2. No evidence of metastatic disease  within the abdomen or pelvis. 3. Postsurgical changes within the lumbar spine. Increased (since 05/18/2014) in nonspecific multiloculated postoperative fluid   Additional reason for visit:  Post-Operative    Problem List/Past Medical([name]; 10/08/2014 9:49 AM) Osteoporosis (733.00  M81.0) Foot pain (729.5  M79.673) Bladder mass (596.89  N32.89) Anemia (285.9  D64.9) Hyperlipidemia (272.4  E78.5) Colon polyp (211.3  K63.5) Chronic low back pain (724.2  M54.5) Hypertension (401.9  I10)    Allergies([name]; 10/08/2014 9:49 AM) Keflex *CEPHALOSPORINS*    Family History([name]; 10/08/2014 9:49 AM) Diabetes Mellitus. Sister. Emphysema. Father. Heart Disease. Mother. Hypertension. Mother.    Social History([name]; 10/08/2014 9:49 AM) Alcohol use. Non-drinker. Tobacco use. Never smoker.    Medication History([name]; 10/08/2014 9:50 AM) Latanoprost (0.005% Solution, Ophthalmic) Active. Calcium 500/Vitamin D (1 tab Oral two times daily) Specific dose unknown - Active. Carvedilol (12.5MG  Tablet, 1 tab Oral two times daily) Active. Cardizem LA (240MG  Tablet ER 24HR, 1 tab Oral daily) Active. Pravastatin Sodium (40MG  Tablet, 1 tab Oral at bedtime) Active. Torsemide (20MG  Tablet, 1 tab Oral daily) Active. Medications Reconciled.    Past Surgical History([name]; 10/08/2014 9:49 AM) Hysterectomy Knee Surgery. Right. Carpal Tunnel Surgery - Right Foot Surgery - Right Back surgeries 6x    Review of Systems([name]; 10/08/2014 9:50 AM) General:Not Present- Chills, Fatigue, Fever and Weight Gain > 10lbs.. Skin:Not Present- Hair Loss, Pruritus and Rash. HEENT:Not Present- Eye Pain, Decreased Hearing, Runny Nose, Snoring and Dry Mucous Membranes. Neck:Not Present- Neck Pain and Swollen Glands. Respiratory:Not Present- Chronic Cough, Difficulty Breathing on Exertion and Wakes up from Sleep Wheezing or  Short of Breath. Cardiovascular:Not Present- Edema, Palpitations and Shortness of Breath. Gastrointestinal:Not Present- Constipation, Diarrhea, Nausea and Vomiting. Female  Genitourinary:Note:See HPI Musculoskeletal:Not Present- Back Pain and Joint Pain. Neurological:Not Present- Decreased Memory, Headaches and Seizures. Psychiatric:Not Present- Anxiety and Depression. Endocrine:Not Present- Excessive Sweating, Heat Intolerance and Tired/Sluggish. Hematology:Not Present- Abnormal Bleeding, Anemia, Blood Transfusion and Enlarged Lymph Nodes.    Vitals([name]; 10/08/2014 9:50 AM) 10/08/2014 9:50 AM Weight: 152.06 lb Height: 61 in Body Surface Area: 1.72 m Body Mass Index: 28.73 kg/m Pulse: 66 (Regular) BP: 133/70 (Sitting, Left Arm, Standard)     Physical Guinevere Ferrari, MD; 10/08/2014 4:54 PM) The physical exam findings are as follows:   General General Appearance- Consistent with stated age. Not in acute distress. Note: He presents to the office today accompanied by her son   Integumentary General Characteristics:Overall examination of the patient's skin reveals - no rashes.   Head and Neck Head- normocephalic, atraumatic with no lesions or palpable masses.   Chest and Lung Exam Chest and lung exam reveals - normal excursion with symmetric chest walls and quiet, even and easy respiratory effort with no use of accessory muscles.   Abdomen Palpation/Percussion:Palpation and Percussion of the abdomen reveal - Soft, Non Tender and No Rebound tenderness. Bladder- Non-palpable. Kidney (Left):Other Characteristics- Non Tender. Kidney (Right):Other Characteristics- Non Tender.   Neurologic Neurologic evaluation reveals - alert and oriented x 3 with no impairment of recent or remote memory. Coordination- Normal. Gait- Normal.   Neuropsychiatric The patient's mood and affect are described as -  normal.    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 Muscle was present on the specimen and was negative for any obvious invasion. Upper tract imaging including bilateral retrograde program is negative. We discussed these pathology results today in detail. standard of care in this situation would be to return to the operating room for repeat TUR to ensure that e upgrading to muscle invasion as this would significantly change our management. I have recommended this in 4-6 weeks from the initial surgery. She is agreeable with this plan. We also briefly discussed the options if no muscle involvement is identified including the active surveillance protocol as well as BCG. Risks and benefits of BCG were discussed in detail including risk of dysuria, hematuria, urinary irritation, and BCG sepsis and failure of the medication. - Plan for repeat TUR BT (restaging), mitomycin - preop UA/ urine culture Current Plans l URINALYSIS, AUTOMATED W/ MICRO (- [facility] -) (81001) l Pt Education - How to access health information online: discussed with patient and provided information.   Signed electronically by Hollice Espy, MD (10/08/2014 5:00 PM)

## 2014-11-02 ENCOUNTER — Ambulatory Visit: Payer: Medicare Other | Admitting: Registered Nurse

## 2014-11-02 ENCOUNTER — Encounter: Payer: Self-pay | Admitting: *Deleted

## 2014-11-02 ENCOUNTER — Encounter
Admission: RE | Disposition: A | Discharge status: Home / Self Care | Payer: Self-pay | Source: Ambulatory Visit | Attending: Urology

## 2014-11-02 ENCOUNTER — Ambulatory Visit
Admission: RE | Admit: 2014-11-02 | Discharge: 2014-11-02 | Disposition: A | Discharge status: Home / Self Care | Payer: Medicare Other | Source: Ambulatory Visit | Attending: Urology | Admitting: Urology

## 2014-11-02 ENCOUNTER — Ambulatory Visit: Payer: Self-pay | Admitting: Internal Medicine

## 2014-11-02 DIAGNOSIS — E785 Hyperlipidemia, unspecified: Secondary | ICD-10-CM | POA: Diagnosis not present

## 2014-11-02 DIAGNOSIS — Z8249 Family history of ischemic heart disease and other diseases of the circulatory system: Secondary | ICD-10-CM | POA: Insufficient documentation

## 2014-11-02 DIAGNOSIS — Z881 Allergy status to other antibiotic agents status: Secondary | ICD-10-CM | POA: Insufficient documentation

## 2014-11-02 DIAGNOSIS — G8929 Other chronic pain: Secondary | ICD-10-CM | POA: Diagnosis not present

## 2014-11-02 DIAGNOSIS — I10 Essential (primary) hypertension: Secondary | ICD-10-CM | POA: Diagnosis not present

## 2014-11-02 DIAGNOSIS — Z86010 Personal history of colonic polyps: Secondary | ICD-10-CM | POA: Insufficient documentation

## 2014-11-02 DIAGNOSIS — Z836 Family history of other diseases of the respiratory system: Secondary | ICD-10-CM | POA: Diagnosis not present

## 2014-11-02 DIAGNOSIS — M545 Low back pain: Secondary | ICD-10-CM | POA: Diagnosis not present

## 2014-11-02 DIAGNOSIS — Z8551 Personal history of malignant neoplasm of bladder: Secondary | ICD-10-CM | POA: Diagnosis present

## 2014-11-02 DIAGNOSIS — Z833 Family history of diabetes mellitus: Secondary | ICD-10-CM | POA: Insufficient documentation

## 2014-11-02 DIAGNOSIS — Z79899 Other long term (current) drug therapy: Secondary | ICD-10-CM | POA: Insufficient documentation

## 2014-11-02 DIAGNOSIS — N3289 Other specified disorders of bladder: Secondary | ICD-10-CM | POA: Insufficient documentation

## 2014-11-02 DIAGNOSIS — M810 Age-related osteoporosis without current pathological fracture: Secondary | ICD-10-CM | POA: Diagnosis not present

## 2014-11-02 DIAGNOSIS — C675 Malignant neoplasm of bladder neck: Secondary | ICD-10-CM | POA: Diagnosis not present

## 2014-11-02 DIAGNOSIS — D649 Anemia, unspecified: Secondary | ICD-10-CM | POA: Insufficient documentation

## 2014-11-02 HISTORY — PX: CYSTOSCOPY WITH BIOPSY: SHX5122

## 2014-11-02 SURGERY — CYSTOSCOPY, WITH BIOPSY
Anesthesia: General | Wound class: Clean Contaminated

## 2014-11-02 MED ORDER — LACTATED RINGERS IV SOLN
INTRAVENOUS | Status: DC
  Administered 2014-11-02: 50 mL/h via INTRAVENOUS
  Administered 2014-11-02: 10:00:00 via INTRAVENOUS

## 2014-11-02 MED ORDER — HYDROCODONE-ACETAMINOPHEN 5-325 MG PO TABS: 1.0000 | ORAL_TABLET | Freq: Four times a day (QID) | ORAL | Status: DC | PRN

## 2014-11-02 MED ORDER — LIDOCAINE HCL (CARDIAC) 20 MG/ML IV SOLN
INTRAVENOUS | Status: DC | PRN
  Administered 2014-11-02: 80 mg via INTRAVENOUS

## 2014-11-02 MED ORDER — MITOMYCIN CHEMO FOR BLADDER INSTILLATION 40 MG
40.0000 mg | Freq: Once | INTRAVENOUS | Status: DC
  Filled 2014-11-02: qty 40

## 2014-11-02 MED ORDER — GLYCOPYRROLATE 0.2 MG/ML IJ SOLN
INTRAMUSCULAR | Status: DC | PRN
  Administered 2014-11-02: 0.2 mg via INTRAVENOUS

## 2014-11-02 MED ORDER — FAMOTIDINE 20 MG PO TABS
ORAL_TABLET | ORAL | Status: AC
  Administered 2014-11-02: 20 mg via ORAL
  Filled 2014-11-02: qty 1

## 2014-11-02 MED ORDER — ONDANSETRON HCL 4 MG/2ML IJ SOLN: 4.0000 mg | Freq: Once | INTRAMUSCULAR | Status: DC | PRN

## 2014-11-02 MED ORDER — LEVOFLOXACIN IN D5W 500 MG/100ML IV SOLN
500.0000 mg | Freq: Once | INTRAVENOUS | Status: AC
  Administered 2014-11-02: 500 mg via INTRAVENOUS

## 2014-11-02 MED ORDER — STERILE WATER FOR IRRIGATION IR SOLN
Status: DC | PRN
  Administered 2014-11-02: 3000 mL

## 2014-11-02 MED ORDER — FAMOTIDINE 20 MG PO TABS
20.0000 mg | ORAL_TABLET | Freq: Once | ORAL | Status: AC
  Administered 2014-11-02: 20 mg via ORAL

## 2014-11-02 MED ORDER — ONDANSETRON HCL 4 MG/2ML IJ SOLN
INTRAMUSCULAR | Status: DC | PRN
  Administered 2014-11-02: 4 mg via INTRAVENOUS

## 2014-11-02 MED ORDER — MIDAZOLAM HCL 2 MG/2ML IJ SOLN
INTRAMUSCULAR | Status: DC | PRN
  Administered 2014-11-02: 2 mg via INTRAVENOUS

## 2014-11-02 MED ORDER — OXYBUTYNIN CHLORIDE 5 MG PO TABS: 5.0000 mg | ORAL_TABLET | Freq: Three times a day (TID) | ORAL | Status: DC | PRN

## 2014-11-02 MED ORDER — MITOMYCIN CHEMO FOR BLADDER INSTILLATION 40 MG
INTRAVENOUS | Status: DC | PRN
  Administered 2014-11-02: 40 mg via INTRAVESICAL

## 2014-11-02 MED ORDER — PHENAZOPYRIDINE HCL 200 MG PO TABS
200.0000 mg | ORAL_TABLET | Freq: Three times a day (TID) | ORAL | Status: DC | PRN
Start: 2014-11-02 — End: 2014-12-17

## 2014-11-02 MED ORDER — FENTANYL CITRATE (PF) 100 MCG/2ML IJ SOLN
INTRAMUSCULAR | Status: DC | PRN
  Administered 2014-11-02: 50 ug via INTRAVENOUS

## 2014-11-02 MED ORDER — FENTANYL CITRATE (PF) 100 MCG/2ML IJ SOLN: 25.0000 ug | INTRAMUSCULAR | Status: DC | PRN

## 2014-11-02 SURGICAL SUPPLY — 18 items
BAG DRAIN CYSTO-URO LG1000N (MISCELLANEOUS) ×3 IMPLANT
CATH FOL 2WAY LX 16X30 (CATHETERS) ×2 IMPLANT
CORD URO TURP 10FT (MISCELLANEOUS) ×3 IMPLANT
GLOVE BIO SURGEON STRL SZ 6.5 (GLOVE) ×2 IMPLANT
GLOVE BIO SURGEON STRL SZ7 (GLOVE) ×8 IMPLANT
GLOVE BIO SURGEONS STRL SZ 6.5 (GLOVE) ×1
GOWN STRL REUS W/ TWL LRG LVL3 (GOWN DISPOSABLE) ×2 IMPLANT
GOWN STRL REUS W/TWL LRG LVL3 (GOWN DISPOSABLE) ×6
JELLY LUB 2OZ STRL (MISCELLANEOUS) ×2
JELLY LUBE 2OZ STRL (MISCELLANEOUS) ×1 IMPLANT
KIT RM TURNOVER CYSTO AR (KITS) ×3 IMPLANT
PACK CYSTO AR (MISCELLANEOUS) ×3 IMPLANT
PAD GROUND ADULT SPLIT (MISCELLANEOUS) ×3 IMPLANT
PREP PVP WINGED SPONGE (MISCELLANEOUS) ×3 IMPLANT
SET CYSTO W/LG BORE CLAMP LF (SET/KITS/TRAYS/PACK) ×3 IMPLANT
TUBE DRAIN URINARY 60IN (UROLOGICAL SUPPLIES) ×2 IMPLANT
WATER STERILE IRR 1000ML POUR (IV SOLUTION) ×3 IMPLANT
WATER STERILE IRR 3000ML UROMA (IV SOLUTION) ×3 IMPLANT

## 2014-11-02 NOTE — Discharge Instructions (Signed)
Transurethral Resection of Bladder Tumor (TURBT) or Bladder Biopsy   Definition:  Transurethral Resection of the Bladder Tumor is a surgical procedure used to diagnose and remove tumors within the bladder. TURBT is the most common treatment for early stage bladder cancer.  General instructions:     Your recent bladder surgery requires very little post hospital care but some definite precautions.  Despite the fact that no skin incisions were used, the area around the bladder incisions are raw and covered with scabs to promote healing and prevent bleeding. Certain precautions are needed to insure that the scabs are not disturbed over the next 2-4 weeks while the healing proceeds.  Because the raw surface inside your bladder and the irritating effects of urine you may expect frequency of urination and/or urgency (a stronger desire to urinate) and perhaps even getting up at night more often. This will usually resolve or improve slowly over the healing period. You may see some blood in your urine over the first 6 weeks. Do not be alarmed, even if the urine was clear for a while. Get off your feet and drink lots of fluids until clearing occurs. If you start to pass clots or don't improve call us.  Diet:  You may return to your normal diet immediately. Because of the raw surface of your bladder, alcohol, spicy foods, foods high in acid and drinks with caffeine may cause irritation or frequency and should be used in moderation. To keep your urine flowing freely and avoid constipation, drink plenty of fluids during the day (8-10 glasses). Tip: Avoid cranberry juice because it is very acidic.  Activity:  Your physical activity doesn't need to be restricted. However, if you are very active, you may see some blood in the urine. We suggest that you reduce your activity under the circumstances until the bleeding has stopped.  Bowels:  It is important to keep your bowels regular during the postoperative  period. Straining with bowel movements can cause bleeding. A bowel movement every other day is reasonable. Use a mild laxative if needed, such as milk of magnesia 2-3 tablespoons, or 2 Dulcolax tablets. Call if you continue to have problems. If you had been taking narcotics for pain, before, during or after your surgery, you may be constipated. Take a laxative if necessary.    Medication:  You should resume your pre-surgery medications unless told not to. In addition you may be given an antibiotic to prevent or treat infection. Antibiotics are not always necessary. All medication should be taken as prescribed until the bottles are finished unless you are having an unusual reaction to one of the drugs.   [facility] [address], [place], [place] [address] [phone]    AMBULATORY SURGERY  DISCHARGE INSTRUCTIONS   1) The drugs that you were given will stay in your system until tomorrow so for the next 24 hours you should not:  A) Drive an automobile B) Make any legal decisions C) Drink any alcoholic beverage   2) You may resume regular meals tomorrow.  Today it is better to start with liquids and gradually work up to solid foods.  You may eat anything you prefer, but it is better to start with liquids, then soup and crackers, and gradually work up to solid foods.   3) Please notify your doctor immediately if you have any unusual bleeding, trouble breathing, redness and pain at the surgery site, drainage, fever, or pain not relieved by medication. 4)  5) Hospital Main Number [phone]

## 2014-11-02 NOTE — Transfer of Care (Signed)
  Immediate Anesthesia Transfer of Care Note  Patient: [name]  Procedure(s) Performed: Procedure(s): CYSTOSCOPY WITH BIOPSY/WITH MITOMYCIN (N/A)  Patient Location: PACU  Anesthesia Type:General  Level of Consciousness: sedated  Airway & Oxygen Therapy: Patient Spontanous Breathing and Patient connected to face mask oxygen  Post-op Assessment: Report given to RN and Post -op Vital signs reviewed and stable  Post vital signs: Reviewed and stable  Last Vitals:  Filed Vitals:   11/02/14 1019  BP: 130/58  Pulse: 70  Temp: 37.1 C  Resp: 17    Complications: No apparent anesthesia complications

## 2014-11-02 NOTE — Op Note (Signed)
Date of procedure: 11/02/2014  Preoperative diagnosis:  1. High-grade T1 bladder cancer  Postoperative diagnosis:  1. Same as above   Procedure: 1. Bladder biopsy 2. Cystoscopy 3. Installation of mitomycin chemotherapy into bladder  Surgeon: Hollice Espy, MD  Anesthesia: General  Complications: None  Intraoperative findings: Small area at right bladder neck consistent with previous recent TUR, no obvious recurrence of tumor. No other bladder tumors/lesions throughout bladder  EBL: Minimal  Specimens: Right bladder neck biopsies x multiple  Drains: 16 Fr Foley  Indication: [name] is a 81 y.o. patient with history of high-grade T1 bladder cancer status post TURBT 4 weeks ago. She returns today to the operating room for repeat TUR to ensure that the tumor has completely been resected in no evidence of upstaging.  After reviewing the management options for treatment, he elected to proceed with the above surgical procedure(s). We have discussed the potential benefits and risks of the procedure, side effects of the proposed treatment, the likelihood of the patient achieving the goals of the procedure, and any potential problems that might occur during the procedure or recuperation. Informed consent has been obtained.  Description of procedure:  The patient was taken to the operating room and general anesthesia was induced.  The patient was placed in the dorsal lithotomy position, prepped and draped in the usual sterile fashion, and preoperative antibiotics were administered. A preoperative time-out was performed.   A 22 French rigid cystoscope was introduced per urethra into the bladder and the bladder was carefully inspected. This revealed no obvious bladder tumors throughout the entire bladder other than a small irregular area at the right bladder neck consistent with previous recent TUR site. There was some adherent fibrinous/necrotic tissue overlying this mucosa but no  obvious gross tumor in this location. At this point in time, cold cup biopsy forceps were used to biopsy the base of this lesion and multiple areas down to the level of muscle and fibers could be seen. These were passed off the field as right bladder neck tumor biopsy. The base of this lesion was then fulgurated using Bugbee electrocautery including the periphery. Hemostasis was excellent.  At this point in time, a 22 French Foley catheter was placed in the balloon was inflated with 10 cc of sterile water. 40 cc of 40 mg of mitomycin chemotherapy was then carefully instilled into the catheter and the Foley was clamped off. This was to remain in place for 1 hour.  The patient was then reversed from anesthesia, taken to the PACU in stable condition with a catheter in place. There were no complications in this case.  Plan: Catheter may be removed after mitomycin instilled for 1 hour. We will follow-up the pathology and go ahead and plan to initiate BCG therapy in 6 weeks as previously discussed in the office unless there is evidence of upstaging her residual tumor.  Hollice Espy, M.D.

## 2014-11-02 NOTE — Anesthesia Procedure Notes (Signed)
Procedure Name: LMA Insertion Date/Time: 11/02/2014 9:38 AM Performed by: Doreen Salvage Pre-anesthesia Checklist: Patient identified, Patient being monitored, Timeout performed, Emergency Drugs available and Suction available Patient Re-evaluated:Patient Re-evaluated prior to inductionOxygen Delivery Method: Circle system utilized Preoxygenation: Pre-oxygenation with 100% oxygen Intubation Type: IV induction Ventilation: Mask ventilation without difficulty LMA: LMA inserted LMA Size: 3.5 Tube type: Oral Number of attempts: 1 Placement Confirmation: positive ETCO2 and breath sounds checked- equal and bilateral Tube secured with: Tape Dental Injury: Teeth and Oropharynx as per pre-operative assessment

## 2014-11-02 NOTE — Interval H&P Note (Signed)
History and Physical Interval Note:  11/02/2014 9:18 AM  [name]  has presented today for surgery, with the diagnosis of BLADDER CANCER  The various methods of treatment have been discussed with the patient and family. After consideration of risks, benefits and other options for treatment, the patient has consented to  Procedure(s): CYSTOSCOPY WITH BIOPSY/WITH MITOMYCIN (N/A) as a surgical intervention .  The patient's history has been reviewed, patient examined, no change in status, stable for surgery.  I have reviewed the patient's chart and labs.  Questions were answered to the patient's satisfaction.    RRR CTAB   Hollice Espy

## 2014-11-02 NOTE — Brief Op Note (Signed)
11/02/2014  10:21 AM  PATIENT:  [name]  81 y.o. female  PRE-OPERATIVE DIAGNOSIS:  BLADDER CANCER  POST-OPERATIVE DIAGNOSIS:  BLADDER CANCER  PROCEDURE:  Procedure(s): CYSTOSCOPY WITH BIOPSY/WITH MITOMYCIN (N/A)  SURGEON:  Surgeon(s) and Role:    * Hollice Espy, MD - Primary  ASSISTANTS: none   ANESTHESIA:   general  EBL:   minimal  Drains: 16 Fr Foley  Specimen: bladder biopsy (right bladder neck)  COUNTS CORRECT: YES  PLAN OF CARE: Discharge to home after PACU  PATIENT DISPOSITION:  PACU - hemodynamically stable.

## 2014-11-02 NOTE — Anesthesia Postprocedure Evaluation (Signed)
  Anesthesia Post-op Note  Patient: [name]  Procedure(s) Performed: Procedure(s): CYSTOSCOPY WITH BIOPSY/WITH MITOMYCIN (N/A)  Anesthesia type:General  Patient location: PACU  Post pain: Pain level controlled  Post assessment: Post-op Vital signs reviewed, Patient's Cardiovascular Status Stable, Respiratory Function Stable, Patent Airway and No signs of Nausea or vomiting  Post vital signs: Reviewed and stable  Last Vitals:  Filed Vitals:   11/02/14 1051  BP:   Pulse:   Temp: 37.3 C  Resp:     Level of consciousness: awake, alert  and patient cooperative  Complications: No apparent anesthesia complications

## 2014-11-02 NOTE — Anesthesia Preprocedure Evaluation (Signed)
Anesthesia Evaluation  Patient identified by MRN, date of birth, ID band Patient awake    Reviewed: Allergy & Precautions, NPO status , Patient's Chart, lab work & pertinent test results  History of Anesthesia Complications Negative for: history of anesthetic complications  Airway Mallampati: III  TM Distance: >3 FB Neck ROM: Full    Dental no notable dental hx.    Pulmonary neg pulmonary ROS,  breath sounds clear to auscultation  Pulmonary exam normal       Cardiovascular Exercise Tolerance: Poor hypertension, Pt. on medications and Pt. on home beta blockers Normal cardiovascular examRhythm:Regular Rate:Normal     Neuro/Psych negative neurological ROS  negative psych ROS   GI/Hepatic Neg liver ROS, PUD, GERD-  Medicated and Controlled,  Endo/Other  negative endocrine ROS  Renal/GU negative Renal ROS  negative genitourinary   Musculoskeletal  (+) Arthritis -,   Abdominal   Peds negative pediatric ROS (+)  Hematology  (+) anemia ,   Anesthesia Other Findings   Reproductive/Obstetrics negative OB ROS                             Anesthesia Physical Anesthesia Plan  ASA: III  Anesthesia Plan: General   Post-op Pain Management:    Induction: Intravenous  Airway Management Planned: LMA  Additional Equipment:   Intra-op Plan:   Post-operative Plan: Extubation in OR  Informed Consent:   Dental advisory given  Plan Discussed with: CRNA and Surgeon  Anesthesia Plan Comments:         Anesthesia Quick Evaluation

## 2014-11-04 LAB — SURGICAL PATHOLOGY

## 2014-11-11 ENCOUNTER — Telehealth: Payer: Self-pay | Admitting: Urology

## 2014-11-11 NOTE — Telephone Encounter (Signed)
Ms. Osterberg called saying she knows Dr. Erlene Quan told her that if she doesn't hear from her then that's a good thing but she would like a phone call regarding the surgery anyway. She said everyday she wonders if she'll get a phone call saying another tumor was found or she needs to have some type of radiation, etc. She'd like a phone call to give her some sort of peace about the surgery. She wants to know that everything was removed and she no longer needs to be worried about further problems.  Pt ph# [phone] Thank you.

## 2014-11-15 NOTE — Telephone Encounter (Signed)
I called the pt on Friday, June 17th and notified her of her pathology report from her last surgery per Dr. Erlene Quan. The pt stated verbal understanding, no questions or concerns.

## 2014-11-18 NOTE — Progress Notes (Signed)
[facility]  [phone] [phone]     ID: [name] OB: [birth year]  MR#: [id number]  EZM#:[id number]  Patient Care Team: Kirk Ruths, MD as PCP - General (Internal Medicine)  CHIEF COMPLAINT/DIAGNOSIS:  1. Symptomatic anemia March 2016, likely due to erosive gastritis causing iron deficiency - received IV Venofer 500 mg on 08/04/14 in-hospital, then started on oral iron therapy.  EGD 08/05/14 - LA Grade A erosive esophagitis. Biopsied. Gastritis. Biopsied. Flattened mucosa was found in the duodenum, not consistent with celiac disease. Biopsied. Erosive gastritis.  2. Unintentional weight loss with workup March 2016 showing thyroid nodule and possible urinary bladder mass - underwent TURBT in May 2016 for high-grade superficial bladder cancer. Following with endocrinology for thyroid nodule.   HISTORY OF PRESENT ILLNESS:  Patient returns for continued hematology follow-up from both issues. States that she is taking oral iron twice daily and denies any major constipation or other side effects from this. States that she was found to have bladder cancer and is planned for intravesical chemotherapy since it was superficial tumor. She is following with endocrinology. States that she is trying to eat better and feels that weight loss has tapered off. Fatigability is better, hemoglobin is improving and is up to 8.6 g today. Denies any obvious bleeding issues. No new bone pains. No fevers. States that she is able to do more physical activity and denies any new dyspnea, orthopnea, angina or PND.   REVIEW OF SYSTEMS:   ROS As in HPI above. In addition, no fever, chills or sweats. No new headaches or focal weakness.  No new sore throat, cough, shortness of breath, sputum, hemoptysis or chest pain. No abdominal pain, constipation, diarrhea, dysuria or hematuria. No new skin rash or bleeding symptoms. No new paresthesias in extremities. No polyuria  polydipsia.   PAST MEDICAL HISTORY: Reviewed. Past Medical History  Diagnosis Date  . Hypertension   . Anemia   . DDD (degenerative disc disease), cervical   . DDD (degenerative disc disease), lumbar   . Elevated lipids   . Osteoporosis   . Cancer     bladder tumor  . Glaucoma   . Multiple gastric ulcers           Hypertension  Hyperlipidemia  Colon polyps s/p repeat colonoscopy in 03/2014, which was negative for polyps and showed internal hemorrhoids Cervical spondylosis   Chronic foot pain  Osteoporosis  Lumbar diskectomy  Hysterectomy decades ago  C-spine and back surgery, total of 6 back surgeries and spinal fusions.   Hospitalization March 2016 for  1. Symptomatic anemia, likely due to gastritis and duodenal ulcers causing iron deficiency. 2. Unintentional weight loss with workup showing thyroid nodule and possible urinary bladder mass.   PAST SURGICAL HISTORY: Reviewed. Past Surgical History  Procedure Laterality Date  . Back surgery      x 6  . Carpal tunnel release Right   . Wrist fracture surgery Left   . Abdominal hysterectomy    . Knee arthroscopy Right     x2  . Mandible surgery Bilateral     x2  . Cataract extraction Bilateral   . Transurethral resection of bladder tumor N/A 09/29/2014    Procedure: TRANSURETHRAL RESECTION OF BLADDER TUMOR (TURBT);  Surgeon: Hollice Espy, MD;  Location: [facility] OR[facility];  Service: Urology;  Laterality: N/A;  . Cystoscopy w/ retrogrades Bilateral 09/29/2014    Procedure: CYSTOSCOPY WITH RETROGRADE PYELOGRAM;  Surgeon: Hollice Espy, MD;  Location: [facility] ORS;  Service:  Urology;  Laterality: Bilateral;  . Eye surgery    . Cystoscopy with biopsy N/A 11/02/2014    Procedure: CYSTOSCOPY WITH BIOPSY/WITH MITOMYCIN;  Surgeon: Hollice Espy, MD;  Location: [facility] OR[facility];  Service: Urology;  Laterality: N/A;    FAMILY HISTORY: Reviewed. Remarkable for heart disease, hypertension, emphysema and diabetes  ADVANCED DIRECTIVES:  Yes  SOCIAL  HISTORY: Reviewed. Denies smoking, alcohol or recreational drug usage.  No Known Allergies  Current Outpatient Prescriptions  Medication Sig Dispense Refill  . acetaminophen (TYLENOL) 650 MG CR tablet Take 650 mg by mouth every 8 (eight) hours as needed for pain.    [name] aspirin 81 MG tablet Take 81 mg by mouth daily.    . calcium-vitamin D (OSCAL WITH D) 500-200 MG-UNIT per tablet Take 2 tablets by mouth 2 (two) times daily.     . carvedilol (COREG) 25 MG tablet Take 25 mg by mouth 2 (two) times daily with a meal.    . diltiazem (CARDIZEM CD) 240 MG 24 hr capsule Take 240 mg by mouth daily.    [name] HYDROcodone-acetaminophen (NORCO/VICODIN) 5-325 MG per tablet Take 1-2 tablets by mouth every 6 (six) hours as needed for moderate pain. 10 tablet 0  . latanoprost (XALATAN) 0.005 % ophthalmic solution Place 1 drop into both eyes at bedtime.    [name] oxybutynin (DITROPAN) 5 MG tablet Take 1 tablet (5 mg total) by mouth every 8 (eight) hours as needed for bladder spasms. 15 tablet 0  . phenazopyridine (PYRIDIUM) 200 MG tablet Take 1 tablet (200 mg total) by mouth 3 (three) times daily as needed for pain. 10 tablet 0  . pravastatin (PRAVACHOL) 40 MG tablet Take 40 mg by mouth daily at 6 PM.    . raloxifene (EVISTA) 60 MG tablet Take 60 mg by mouth daily.    [name] torsemide (DEMADEX) 20 MG tablet Take 20 mg by mouth daily as needed.     No current facility-administered medications for this visit.    PHYSICAL EXAM: Filed Vitals:   10/29/14 1225  BP: 149/60  Pulse: 52  Temp: 95.9 F (35.5 C)  Resp: 18     Body mass index is 28.55 kg/(m^2).      GENERAL: Patient is alert and oriented and in no acute distress. There is no icterus. HEENT: EOMs intact. No cervical lymphadenopathy. CVS: S1S2, regular LUNGS: Bilaterally clear to auscultation, no rhonchi. ABDOMEN: Soft, nontender.    EXTREMITIES: No pedal edema.  LAB RESULTS: Lab Results  Component Value Date   WBC 8.2 10/26/2014   NEUTROABS 6.3  10/26/2014   HGB 8.6* 10/26/2014   HCT 28.0* 10/26/2014   MCV 78.4* 10/26/2014   PLT 370 10/26/2014   10/26/2014 - hemoglobin 8.6, WBC 8200, 76% neutrophils, platelets 370, serum iron low at 16, iron saturation low at 6%, ferritin 84.  09/29/14 - TURBT  Surgical Pathology Report.   DIAGNOSIS:   A. URINARY BLADDER, MASS; TURBT:  UROTHELIAL CARCINOMA, INVASIVE INTO LAMINA PROPRIA, HIGH GRADE, AND INVERTED.  FOCI OF FLAT CARCINOMA IN SITU ARE NOTED.  NO MUSCULARIS PROPRIA PRESENT.   B. URINARY BLADDER, TUMOR BASE; BIOPSY: NO MALIGNANCY SEEN.    STUDIES: 08/04/14 - CT scan of chest/abdomen/pelvis. 1.  No acute process in the chest. 2.  Atherosclerosis, including within the coronary arteries. 3. Tiny nonspecific posterior right upper lobe pulmonary nodule which can be re-evaluated on follow-up. 4. Right thyroid nodule which warrants further evaluation with thyroid ultrasound. 1. Right bladder base nodule, highly suspicious for transitional cell  carcinoma. These results will be called to the ordering clinician or representative by the Radiologist Assistant, and communication documented in the PACS or zVision Dashboard. 2. No evidence of metastatic disease within the abdomen or pelvis. 3. Postsurgical changes within the lumbar spine. Increased (since 05/18/2014) in nonspecific multiloculated postoperative fluid collection/collections.  08/05/14 - US Thyroid - IMPRESSION: Bilateral nodules. The dominant nodule in the lower pole of the right lobe measures 2.1 cm. Findings meet consensus criteria for biopsy. Ultrasound-guided fine needle aspiration should be considered.  ASSESSMENT / PLAN:   1. Symptomatic anemia March 2016, likely due to erosive gastritis causing iron deficiency - received IV Venofer 500 mg on 08/04/14 in-hospital, then started on oral iron therapy. Have reviewed labs from May 31 (10/26/14 - hemoglobin 8.6, WBC 8200, 76% neutrophils, platelets 370, serum iron low at 16, iron saturation low at  6%, ferritin 84) and discussed with patient in detail. Hemoglobin is beginning to improve and is 8.6 (was as low as 5.8 recently), ferritin is normal. Patient will continue taking ferrous sulfate by mouth twice a day since she is tolerating this well, and will continue to monitor to see if this will correct her anemia. States that she would rather consider parenteral iron therapy if necessary only. Will get repeat CBC and iron study once every 12 weeks, next MD follow-up at 24 weeks. Patient advised to call us if she cannot tolerate oral iron in which case we can pursue further parenteral iron therapy.   2. Unintentional weight loss with workup March 2016 showing thyroid nodule and possible urinary bladder mass - Patient following with urology, had TURBT which showed superficial bladder cancer, states that she is being planned for intravesical chemotherapy. She is following with endocrinology for thyroid nodule. Overall has lost 1-2 pounds since March, stated that she would try to eat better and notify us if she has continued unintentional weight loss.  3. In between visits, patient advised to call or come to ER in case of any fevers, progressive symptoms or acute sickness. She is agreeable to this plan.    Leia Alf, MD   11/18/2014 5:02 PM

## 2014-12-17 ENCOUNTER — Encounter: Payer: Self-pay | Admitting: Urology

## 2014-12-17 ENCOUNTER — Ambulatory Visit (INDEPENDENT_AMBULATORY_CARE_PROVIDER_SITE_OTHER): Payer: Medicare Other | Admitting: Urology

## 2014-12-17 VITALS — BP 169/77 | HR 76 | Ht 61.0 in | Wt 184.2 lb

## 2014-12-17 DIAGNOSIS — C675 Malignant neoplasm of bladder neck: Secondary | ICD-10-CM | POA: Diagnosis not present

## 2014-12-17 DIAGNOSIS — K635 Polyp of colon: Secondary | ICD-10-CM | POA: Insufficient documentation

## 2014-12-17 LAB — URINALYSIS, COMPLETE
BILIRUBIN UA: NEGATIVE
GLUCOSE, UA: NEGATIVE
Ketones, UA: NEGATIVE
Nitrite, UA: NEGATIVE
SPEC GRAV UA: 1.015 (ref 1.005–1.030)
Urobilinogen, Ur: 0.2 mg/dL (ref 0.2–1.0)
pH, UA: 6 (ref 5.0–7.5)

## 2014-12-17 LAB — MICROSCOPIC EXAMINATION

## 2014-12-17 NOTE — Progress Notes (Signed)
12/17/2014 11:10 AM   [name] [birth year] [id number]  Referring provider: Kirk Ruths, MD [place], [place] [address]  Chief Complaint  Patient presents with  . Routine Post Op    6wk    HPI: 81 year old nonsmoker who presents today for further evaluation of an incidental bladder nodule identified on CT scan abdomen and pelvis with contrast which showed an incidental 11 mm papillary neoplasm near the RIGHT bladder neck.   She was taken to the operating room on 09/29/2014 for TURBT, Bilateral retrograde pyelogram at which time a 2 cm extremely spherical mass was identified just proximal to the RIGHT UO. This was resected along with deeper biopsies of the base. Of note, bilateral retrograde pyelogram was negative for any obvious upper tract filling defects or hydronephrosis.  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 The tumor had an inverted architecture. Deeper biopsies did contain muscle after speaking with the pathologist today and the muscle was negative for any tumor. Additonally, CIS was noted.  She returned to the operating room on 11/02/2014 for restaging TURBT. Pathology showed no evidence of residual tumor.  She denies a history of smoking or second hand smoking exposure. 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 She denies ever being exposed to textile dyes or any other chemicals.  She returns to the office today for initiation of BCG therapy which was previously discussed. She has no voiding complaints today. No dysuria or gross hematuria.     PMH: Past Medical History  Diagnosis Date  . Hypertension   . Anemia   . DDD (degenerative disc disease), cervical   . DDD (degenerative disc disease), lumbar   . Elevated lipids   . Osteoporosis   . Cancer     bladder tumor  . Glaucoma   . Multiple gastric ulcers     Surgical History: Past Surgical History    Procedure Laterality Date  . Back surgery      x 6  . Carpal tunnel release Right   . Wrist fracture surgery Left   . Abdominal hysterectomy    . Knee arthroscopy Right     x2  . Mandible surgery Bilateral     x2  . Cataract extraction Bilateral   . Transurethral resection of bladder tumor N/A 09/29/2014    Procedure: TRANSURETHRAL RESECTION OF BLADDER TUMOR (TURBT);  Surgeon: Hollice Espy, MD;  Location: [facility];  Service: Urology;  Laterality: N/A;  . Cystoscopy w/ retrogrades Bilateral 09/29/2014    Procedure: CYSTOSCOPY WITH RETROGRADE PYELOGRAM;  Surgeon: Hollice Espy, MD;  Location: [facility];  Service: Urology;  Laterality: Bilateral;  . Eye surgery    . Cystoscopy with biopsy N/A 11/02/2014    Procedure: CYSTOSCOPY WITH BIOPSY/WITH MITOMYCIN;  Surgeon: Hollice Espy, MD;  Location: [facility];  Service: Urology;  Laterality: N/A;    Home Medications:    Medication List       This list is accurate as of: 12/17/14 11:10 AM.  Always use your most recent med list.               acetaminophen 650 MG CR tablet  Commonly known as:  TYLENOL  Take 650 mg by mouth every 8 (eight) hours as needed for pain.     calcium-vitamin D 500-200 MG-UNIT per tablet  Commonly known as:  OSCAL WITH D  Take 2 tablets by mouth 2 (two) times daily.  carvedilol 25 MG tablet  Commonly known as:  COREG  Take 25 mg by mouth 2 (two) times daily with a meal.     diltiazem 240 MG 24 hr capsule  Commonly known as:  CARDIZEM CD  Take 240 mg by mouth daily.     latanoprost 0.005 % ophthalmic solution  Commonly known as:  XALATAN  Place 1 drop into both eyes at bedtime.     pantoprazole 40 MG tablet  Commonly known as:  PROTONIX     pravastatin 40 MG tablet  Commonly known as:  PRAVACHOL  Take 40 mg by mouth daily at 6 PM.     raloxifene 60 MG tablet  Commonly known as:  EVISTA  Take 60 mg by mouth daily.        Allergies: No Known Allergies  Family History: Family History   Problem Relation Age of Onset  . Heart attack Father     Social History:  reports that she has never smoked. She does not have any smokeless tobacco history on file. She reports that she does not drink alcohol or use illicit drugs.  ROS: UROLOGY Frequent Urination?: No Hard to postpone urination?: No Burning/pain with urination?: No Get up at night to urinate?: Yes Leakage of urine?: No Urine stream starts and stops?: No Trouble starting stream?: No Do you have to strain to urinate?: No Blood in urine?: No Urinary tract infection?: No Sexually transmitted disease?: No Injury to kidneys or bladder?: No Painful intercourse?: No Weak stream?: No Currently pregnant?: No Vaginal bleeding?: No Last menstrual period?: n  Gastrointestinal Nausea?: No Vomiting?: No Indigestion/heartburn?: No Diarrhea?: No Constipation?: No  Constitutional Fever: No Night sweats?: No Weight loss?: Yes Fatigue?: No  Skin Skin rash/lesions?: No Itching?: No  Eyes Blurred vision?: No Double vision?: No  Ears/Nose/Throat Sore throat?: No Sinus problems?: No  Hematologic/Lymphatic Swollen glands?: No Easy bruising?: Yes  Cardiovascular Leg swelling?: No Chest pain?: No  Respiratory Cough?: No Shortness of breath?: No  Endocrine Excessive thirst?: No  Musculoskeletal Back pain?: Yes Joint pain?: No  Neurological Headaches?: No Dizziness?: No  Psychologic Depression?: No Anxiety?: No  Physical Exam: BP 169/77 mmHg  Pulse 76  Ht 5\' 1"  (1.549 m)  Wt 184 lb 3.2 oz (83.553 kg)  BMI 34.82 kg/m2  Constitutional:  Alert and oriented, No acute distress. HEENT: [place] AT, moist mucus membranes.  Trachea midline, no masses. Cardiovascular: No clubbing, cyanosis, or edema. Respiratory: Normal respiratory effort, no increased work of breathing. GI: Abdomen is soft, nontender, nondistended, no abdominal masses GU: No CVA tenderness.  Skin: No rashes, bruises or suspicious  lesions. Neurologic: Grossly intact, no focal deficits, moving all 4 extremities. Psychiatric: Normal mood and affect.  Laboratory Data: Lab Results  Component Value Date   WBC 8.2 10/26/2014   HGB 8.6* 10/26/2014   HCT 28.0* 10/26/2014   MCV 78.4* 10/26/2014   PLT 370 10/26/2014    Urinalysis UA today shows 11-30 red blood cells as well as 11-30 white blood cells. Nitrite is negative. There are few bacteria present. There is no significant epithelial cells.  Pertinent Imaging: CT abd/ pelvis 08/04/14 CT ABDOMEN AND PELVIS IMPRESSION 1. Right bladder base nodule, highly suspicious for transitional cell carcinoma. These results will be called to the ordering clinician or representative by the Radiologist Assistant, and communication documented in the PACS or zVision Dashboard. 2. No evidence of metastatic disease within the abdomen or pelvis. 3. Postsurgical changes within the lumbar spine. Increased (since 05/18/2014)  in nonspecific multiloculated postoperative fluid  Assessment & Plan:  81 year old female with a history of high-grade T1, Tis of the right bladder neck status post repeat staging TURBT  6 weeks ago. No evidence of upstaging her residual tumor.   Pathology was reviewed reviewed again today in detail. Recommend course of BCG induction therapy starting today, however, UA is mildly suspicious for infection. I would like to go ahead and rule out infection prior to starting BCG. We again today discussed the risks and benefits of BCG including the risk of dysuria, urinary symptoms, urinary tract infection, and BCG sepsis. Information sheet was given today. Info sheet gven today. She will follow up in 1 week to initiate BCG if her urine culture is negative.  1. Malignant neoplasm of bladder neck - Urinalysis, Complete - CULTURE, URINE COMPREHENSIVE   Return for next Thursday to start BCG x 6.  Hollice Espy, MD  [facility] [address],  [place], [place] [address] [phone]

## 2014-12-20 LAB — CULTURE, URINE COMPREHENSIVE

## 2014-12-23 ENCOUNTER — Ambulatory Visit (INDEPENDENT_AMBULATORY_CARE_PROVIDER_SITE_OTHER): Payer: Medicare Other | Admitting: Urology

## 2014-12-23 VITALS — BP 183/108 | HR 83 | Ht 61.0 in | Wt 147.1 lb

## 2014-12-23 DIAGNOSIS — C679 Malignant neoplasm of bladder, unspecified: Secondary | ICD-10-CM

## 2014-12-23 LAB — URINALYSIS, COMPLETE
BILIRUBIN UA: NEGATIVE
Glucose, UA: NEGATIVE
Ketones, UA: NEGATIVE
NITRITE UA: NEGATIVE
PH UA: 6 (ref 5.0–7.5)
SPEC GRAV UA: 1.015 (ref 1.005–1.030)
UUROB: 0.2 mg/dL (ref 0.2–1.0)

## 2014-12-23 LAB — MICROSCOPIC EXAMINATION

## 2014-12-23 MED ORDER — LIDOCAINE HCL 2 % EX GEL
1.0000 "application " | Freq: Once | CUTANEOUS | Status: AC
  Administered 2014-12-23: 1 via URETHRAL

## 2014-12-23 MED ORDER — SODIUM CHLORIDE 0.9 % IJ SOLN
50.0000 mL | Freq: Once | INTRAMUSCULAR | Status: AC
  Administered 2014-12-23: 50 mL

## 2014-12-23 MED ORDER — BCG LIVE 50 MG IS SUSR
3.2400 mL | Freq: Once | INTRAVESICAL | Status: AC
  Administered 2014-12-23: 81 mg via INTRAVESICAL

## 2014-12-23 NOTE — Progress Notes (Signed)
2:30 PM   [name] 31-Dec-1932 [id number]  Referring provider: Kirk Ruths, MD [place], [place] [address]  Chief Complaint  Patient presents with  . Bladder Cancer    BCG #52    HPI: 81 year old nonsmoker with  incidental 11 mm papillary neoplasm near the RIGHT bladder neck. She was taken to the operating room on 09/29/2014 for TURBT, Bilateral retrograde pyelogram at which time a 2 cm extremely spherical mass was identified just proximal to the RIGHT UO. This was resected along with deeper biopsies of the base. Of note, bilateral retrograde pyelogram was negative for any obvious upper tract filling defects or hydronephrosis.  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 The tumor had an inverted architecture. Deeper biopsies did contain muscle after speaking with the pathologist today and the muscle was negative for any tumor. Additonally, CIS was noted.  She returned to the operating room on 11/02/2014 for restaging TURBT. Pathology showed no evidence of residual tumor.  She denies a history of smoking or second hand smoking exposure. 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 She denies ever being exposed to textile dyes or any other chemicals.  She returns to the office today for initiation of BCG therapy #1 which was previously discussed. She has no voiding complaints today. No dysuria or gross hematuria.  This was originally scheduled for next week, however, her UA was mildly suspicious last week therefore culture was sent which ultimately was negative.   PMH: Past Medical History  Diagnosis Date  . Hypertension   . Anemia   . DDD (degenerative disc disease), cervical   . DDD (degenerative disc disease), lumbar   . Elevated lipids   . Osteoporosis   . Cancer     bladder tumor  . Glaucoma   . Multiple gastric ulcers     Surgical History: Past Surgical History    Procedure Laterality Date  . Back surgery      x 6  . Carpal tunnel release Right   . Wrist fracture surgery Left   . Abdominal hysterectomy    . Knee arthroscopy Right     x2  . Mandible surgery Bilateral     x2  . Cataract extraction Bilateral   . Transurethral resection of bladder tumor N/A 09/29/2014    Procedure: TRANSURETHRAL RESECTION OF BLADDER TUMOR (TURBT);  Surgeon: Hollice Espy, MD;  Location: [facility]S;  Service: Urology;  Laterality: N/A;  . Cystoscopy w/ retrogrades Bilateral 09/29/2014    Procedure: CYSTOSCOPY WITH RETROGRADE PYELOGRAM;  Surgeon: Hollice Espy, MD;  Location: [facility];  Service: Urology;  Laterality: Bilateral;  . Eye surgery    . Cystoscopy with biopsy N/A 11/02/2014    Procedure: CYSTOSCOPY WITH BIOPSY/WITH MITOMYCIN;  Surgeon: Hollice Espy, MD;  Location: [facility];  Service: Urology;  Laterality: N/A;    Home Medications:    Medication List       This list is accurate as of: 12/23/14  2:30 PM.  Always use your most recent med list.               acetaminophen 650 MG CR tablet  Commonly known as:  TYLENOL  Take 650 mg by mouth every 8 (eight) hours as needed for pain.     calcium-vitamin D 500-200 MG-UNIT per tablet  Commonly known as:  OSCAL WITH D  Take 2 tablets by mouth 2 (two) times daily.  carvedilol 25 MG tablet  Commonly known as:  COREG  Take 25 mg by mouth 2 (two) times daily with a meal.     diltiazem 240 MG 24 hr capsule  Commonly known as:  CARDIZEM CD  Take 240 mg by mouth daily.     latanoprost 0.005 % ophthalmic solution  Commonly known as:  XALATAN  Place 1 drop into both eyes at bedtime.     pantoprazole 40 MG tablet  Commonly known as:  PROTONIX     pravastatin 40 MG tablet  Commonly known as:  PRAVACHOL  Take 40 mg by mouth daily at 6 PM.     raloxifene 60 MG tablet  Commonly known as:  EVISTA  Take 60 mg by mouth daily.        Allergies: No Known Allergies  Family History: Family History   Problem Relation Age of Onset  . Heart attack Father     Social History:  reports that she has never smoked. She does not have any smokeless tobacco history on file. She reports that she does not drink alcohol or use illicit drugs.   Physical Exam: BP 183/108 mmHg  Pulse 83  Ht 5\' 1"  (1.549 m)  Wt 147 lb 1.6 oz (66.724 kg)  BMI 27.81 kg/m2  Constitutional:  Alert and oriented, No acute distress. HEENT: [place] AT, moist mucus membranes.  Trachea midline, no masses. Cardiovascular: No clubbing, cyanosis, or edema. Respiratory: Normal respiratory effort, no increased work of breathing. GI: Abdomen is soft, nontender, nondistended, no abdominal masses Skin: No rashes, bruises or suspicious lesions. Neurologic: Grossly intact, no focal deficits, moving all 4 extremities. Psychiatric: Normal mood and affect.  Laboratory Data: Lab Results  Component Value Date   WBC 8.2 10/26/2014   HGB 8.6* 10/26/2014   HCT 28.0* 10/26/2014   MCV 78.4* 10/26/2014   PLT 370 10/26/2014    Urinalysis Results for orders placed or performed in visit on 12/23/14  Microscopic Examination  Result Value Ref Range   WBC, UA 11-30 (A) 0 -  5 /hpf   RBC, UA 3-10 (A) 0 -  2 /hpf   Epithelial Cells (non renal) 0-10 0 - 10 /hpf   Renal Epithel, UA 0-10 (A) None seen /hpf   Bacteria, UA Few None seen/Few  Urinalysis, Complete  Result Value Ref Range   Specific Gravity, UA 1.015 1.005 - 1.030   pH, UA 6.0 5.0 - 7.5   Color, UA Yellow Yellow   Appearance Ur Clear Clear   Leukocytes, UA 1+ (A) Negative   Protein, UA 2+ (A) Negative/Trace   Glucose, UA Negative Negative   Ketones, UA Negative Negative   RBC, UA 2+ (A) Negative   Bilirubin, UA Negative Negative   Urobilinogen, Ur 0.2 0.2 - 1.0 mg/dL   Nitrite, UA Negative Negative   Microscopic Examination See below:    UCx 12/17/14  Comments: Mixed urogenital flora  10,000-25,000 colony forming units per mL   Pertinent Imaging: CT abd/ pelvis  08/04/14 CT ABDOMEN AND PELVIS IMPRESSION 1. Right bladder base nodule, highly suspicious for transitional cell carcinoma. These results will be called to the ordering clinician or representative by the Radiologist Assistant, and communication documented in the PACS or zVision Dashboard. 2. No evidence of metastatic disease within the abdomen or pelvis. 3. Postsurgical changes within the lumbar spine. Increased (since 05/18/2014) in nonspecific multiloculated postoperative fluid  Assessment & Plan:  81 year old female with a history of high-grade T1, Tis of the right bladder  neck status post repeat staging TURBT  7 weeks ago. No evidence of upstaging her residual tumor.   Currently undergoing BCG therapy, dose #1 of 6 today.  1. Malignant neoplasm of bladder neck - Urinalysis, Complete - CULTURE, URINE COMPREHENSIVE   Return in about 1 week (around 12/30/2014) for BCG #2.  Hollice Espy, MD  [facility] [address] [place], [place] [address] [phone]

## 2014-12-23 NOTE — Progress Notes (Signed)
BCG Bladder Instillation  BCG # 1  Due to Bladder Cancer patient is present today for a BCG treatment. Patient was cleaned and prepped in a sterile fashion with betadine and lidocaine 2% jelly was instilled into the urethra.  A 14FR catheter was inserted, urine return was noted 32ml, urine was yellow in color.  37ml of reconstituted BCG was instilled into the bladder. The catheter was then removed. Patient tolerated well, no complications were noted  Preformed by: Fonnie Jarvis, CMA  Follow up/ Additional notes: 1 week follow up for next treatment

## 2014-12-28 ENCOUNTER — Encounter: Payer: Self-pay | Admitting: *Deleted

## 2014-12-30 ENCOUNTER — Encounter: Payer: Self-pay | Admitting: Urology

## 2014-12-30 ENCOUNTER — Ambulatory Visit (INDEPENDENT_AMBULATORY_CARE_PROVIDER_SITE_OTHER): Payer: Medicare Other | Admitting: Urology

## 2014-12-30 VITALS — BP 156/69 | HR 85 | Ht 61.0 in | Wt 146.7 lb

## 2014-12-30 DIAGNOSIS — C675 Malignant neoplasm of bladder neck: Secondary | ICD-10-CM | POA: Diagnosis not present

## 2014-12-30 DIAGNOSIS — C679 Malignant neoplasm of bladder, unspecified: Secondary | ICD-10-CM | POA: Diagnosis not present

## 2014-12-30 LAB — URINALYSIS, COMPLETE
BILIRUBIN UA: NEGATIVE
Glucose, UA: NEGATIVE
Ketones, UA: NEGATIVE
Nitrite, UA: NEGATIVE
SPEC GRAV UA: 1.015 (ref 1.005–1.030)
UUROB: 0.2 mg/dL (ref 0.2–1.0)
pH, UA: 5.5 (ref 5.0–7.5)

## 2014-12-30 LAB — MICROSCOPIC EXAMINATION

## 2014-12-30 MED ORDER — BCG LIVE 50 MG IS SUSR
3.2400 mL | Freq: Once | INTRAVESICAL | Status: AC
  Administered 2014-12-30: 81 mg via INTRAVESICAL

## 2014-12-30 MED ORDER — LIDOCAINE HCL 2 % EX GEL
1.0000 "application " | Freq: Once | CUTANEOUS | Status: AC
  Administered 2014-12-30: 1 via URETHRAL

## 2014-12-30 NOTE — Progress Notes (Signed)
12/30/2014 9:33 AM   [name] 1933/03/08 [id number]  Referring provider: Kirk Ruths, MD [place], [place] [address]  Chief Complaint  Patient presents with  . Bladder Cancer    2 BCG    HPI: [name] is an 81 year old white female undergoing BCG treatment for a history of high grade T1, Tis of the right bladder neck status post repeat staging TURBT completed on 11/02/2014.  No evidence of upstaging her residual tumor.  She returns to the office today for her  BCG therapy #2  which was previously discussed. She has no voiding complaints today. No dysuria or gross hematuria. She had no difficulty retaining the installation for the full 2 hours at her last visit. She also denies any cough. Her UA is unchanged containing WBC is RBC's. Her last urine culture was negative.  Previous bladder cancer history: 81 year old nonsmoker with incidental 11 mm papillary neoplasm near the RIGHT bladder neck. She was taken to the operating room on 09/29/2014 for TURBT, Bilateral retrograde pyelogram at which time a 2 cm extremely spherical mass was identified just proximal to the RIGHT UO. This was resected along with deeper biopsies of the base. Of note, bilateral retrograde pyelogram was negative for any obvious upper tract filling defects or hydronephrosis.  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 The tumor had an inverted architecture. Deeper biopsies did contain muscle after speaking with the pathologist today and the muscle was negative for any tumor. Additionally, CIS was noted.  She returned to the operating room on 11/02/2014 for restaging TURBT. Pathology showed no evidence of residual tumor.  She denies a history of smoking or second hand smoking exposure. 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 She denies ever being exposed to textile dyes or any other  chemicals.     PMH: Past Medical History  Diagnosis Date  . Hypertension   . Anemia   . DDD (degenerative disc disease), cervical   . DDD (degenerative disc disease), lumbar   . Elevated lipids   . Osteoporosis   . Cancer     bladder tumor  . Glaucoma   . Multiple gastric ulcers   . Foot pain   . Colon polyp   . Chronic low back pain     Surgical History: Past Surgical History  Procedure Laterality Date  . Back surgery      x 6  . Carpal tunnel release Right   . Wrist fracture surgery Left   . Abdominal hysterectomy    . Knee arthroscopy Right     x2  . Mandible surgery Bilateral     x2  . Cataract extraction Bilateral   . Transurethral resection of bladder tumor N/A 09/29/2014    Procedure: TRANSURETHRAL RESECTION OF BLADDER TUMOR (TURBT);  Surgeon: Hollice Espy, MD;  Location: [facility];  Service: Urology;  Laterality: N/A;  . Cystoscopy w/ retrogrades Bilateral 09/29/2014    Procedure: CYSTOSCOPY WITH RETROGRADE PYELOGRAM;  Surgeon: Hollice Espy, MD;  Location: [facility];  Service: Urology;  Laterality: Bilateral;  . Eye surgery    . Cystoscopy with biopsy N/A 11/02/2014    Procedure: CYSTOSCOPY WITH BIOPSY/WITH MITOMYCIN;  Surgeon: Hollice Espy, MD;  Location: [facility];  Service: Urology;  Laterality: N/A;  . Foot surgery Right     Home Medications:    Medication List       This list is accurate as of: 12/30/14  9:33 AM.  Always use your most recent med list.               acetaminophen 650 MG CR tablet  Commonly known as:  TYLENOL  Take 650 mg by mouth every 8 (eight) hours as needed for pain.     calcium-vitamin D 500-200 MG-UNIT per tablet  Commonly known as:  OSCAL WITH D  Take 2 tablets by mouth 2 (two) times daily.     carvedilol 25 MG tablet  Commonly known as:  COREG  Take 25 mg by mouth 2 (two) times daily with a meal.     diltiazem 240 MG 24 hr capsule  Commonly known as:  CARDIZEM CD  Take 240 mg by mouth daily.     latanoprost 0.005  % ophthalmic solution  Commonly known as:  XALATAN  Place 1 drop into both eyes at bedtime.     pantoprazole 40 MG tablet  Commonly known as:  PROTONIX     pravastatin 40 MG tablet  Commonly known as:  PRAVACHOL  Take 40 mg by mouth daily at 6 PM.     raloxifene 60 MG tablet  Commonly known as:  EVISTA  Take 60 mg by mouth daily.        Allergies:  Allergies  Allergen Reactions  . Keflex [Cephalexin]     Family History: Family History  Problem Relation Age of Onset  . Heart attack Father   . Emphysema Father   . Diabetes type II Sister   . Heart disease Mother   . Hypertension Mother   . Kidney disease Neg Hx   . Bladder Cancer Neg Hx     Social History:  reports that she has never smoked. She does not have any smokeless tobacco history on file. She reports that she does not drink alcohol or use illicit drugs.   Physical Exam: BP 156/69 mmHg  Pulse 85  Ht 5\' 1"  (1.549 m)  Wt 146 lb 11.2 oz (66.543 kg)  BMI 27.73 kg/m2   Laboratory Data: Results for orders placed or performed in visit on 12/30/14  Microscopic Examination  Result Value Ref Range   WBC, UA 11-30 (A) 0 -  5 /hpf   RBC, UA 11-30 (A) 0 -  2 /hpf   Epithelial Cells (non renal) 0-10 0 - 10 /hpf   Casts Present (A) None seen /lpf   Cast Type Hyaline casts N/A   Bacteria, UA Few None seen/Few  Urinalysis, Complete  Result Value Ref Range   Specific Gravity, UA 1.015 1.005 - 1.030   pH, UA 5.5 5.0 - 7.5   Color, UA Yellow Yellow   Appearance Ur Clear Clear   Leukocytes, UA 2+ (A) Negative   Protein, UA 1+ (A) Negative/Trace   Glucose, UA Negative Negative   Ketones, UA Negative Negative   RBC, UA 2+ (A) Negative   Bilirubin, UA Negative Negative   Urobilinogen, Ur 0.2 0.2 - 1.0 mg/dL   Nitrite, UA Negative Negative   Microscopic Examination See below:     Lab Results  Component Value Date   WBC 8.2 10/26/2014   HGB 8.6* 10/26/2014   HCT 28.0* 10/26/2014   MCV 78.4* 10/26/2014   PLT  370 10/26/2014    No results found for: CREATININE  No results found for: PSA  No results found for: TESTOSTERONE  No results found for: HGBA1C  Urinalysis    Component Value Date/Time   COLORURINE AMBER* 10/26/2014 1105  APPEARANCEUR CLEAR* 10/26/2014 1105   LABSPEC 1.028 10/26/2014 1105   PHURINE 5.0 10/26/2014 1105   GLUCOSEU Negative 12/23/2014 1043   HGBUR NEGATIVE 10/26/2014 1105   BILIRUBINUR Negative 12/23/2014 1043   BILIRUBINUR NEGATIVE 10/26/2014 1105   KETONESUR TRACE* 10/26/2014 1105   PROTEINUR 30* 10/26/2014 1105   NITRITE Negative 12/23/2014 1043   NITRITE NEGATIVE 10/26/2014 1105   LEUKOCYTESUR 1+* 12/23/2014 1043   LEUKOCYTESUR TRACE* 10/26/2014 1105    Pertinent Imaging:   Assessment & Plan:    1. Malignant neoplasm of bladder neck:   Patient with a history of high-grade T1, Tis of the right bladder neck status post repeat staging TURBT on 11/02/2014.  No evidence of upstaging her residual tumor.  Currently undergoing BCG therapy, dose #2 of 6 today.  - Urinalysis, Complete   Return in about 1 week (around 01/06/2015) for BCG, UA and OV.  Zara Council, [facility] [address], [place], [place] [address] [phone]

## 2014-12-30 NOTE — Progress Notes (Signed)
BCG Bladder Instillation  BCG # 2  Due to Bladder Cancer patient is present today for a BCG treatment. Patient was cleaned and prepped in a sterile fashion with betadine and lidocaine 2% jelly was instilled into the urethra.  A 14FR catheter was inserted, urine return was noted 1ml, urine was clear and yellow in color.  72ml of reconstituted BCG was instilled into the bladder. The catheter was then removed. Patient tolerated well, no complications were noted  Preformed by: Gwenevere Abbot CMA and Zara Council PA-C  Follow up/ Additional notes: One week

## 2015-01-06 ENCOUNTER — Ambulatory Visit (INDEPENDENT_AMBULATORY_CARE_PROVIDER_SITE_OTHER): Payer: Medicare Other | Admitting: Urology

## 2015-01-06 ENCOUNTER — Ambulatory Visit: Payer: Medicare Other | Admitting: Urology

## 2015-01-06 ENCOUNTER — Encounter: Payer: Self-pay | Admitting: Urology

## 2015-01-06 VITALS — BP 145/69 | HR 55 | Resp 16 | Ht 61.0 in | Wt 147.6 lb

## 2015-01-06 DIAGNOSIS — C675 Malignant neoplasm of bladder neck: Secondary | ICD-10-CM | POA: Diagnosis not present

## 2015-01-06 LAB — MICROSCOPIC EXAMINATION: Bacteria, UA: NONE SEEN

## 2015-01-06 LAB — URINALYSIS, COMPLETE
BILIRUBIN UA: NEGATIVE
Glucose, UA: NEGATIVE
NITRITE UA: NEGATIVE
PH UA: 5.5 (ref 5.0–7.5)
Specific Gravity, UA: 1.025 (ref 1.005–1.030)
UUROB: 0.2 mg/dL (ref 0.2–1.0)

## 2015-01-06 MED ORDER — SODIUM CHLORIDE 0.9 % IJ SOLN: 50.0000 mL | Freq: Once | INTRAMUSCULAR | Status: DC

## 2015-01-06 MED ORDER — SODIUM CHLORIDE 0.9 % IJ SOLN
50.0000 mL | Freq: Once | INTRAMUSCULAR | Status: AC
Start: 2015-01-06 — End: 2015-01-06
  Administered 2015-01-06: 50 mL

## 2015-01-06 MED ORDER — LIDOCAINE HCL 2 % EX GEL
1.0000 "application " | Freq: Once | CUTANEOUS | Status: AC
  Administered 2015-01-06: 1 via URETHRAL

## 2015-01-06 MED ORDER — BCG LIVE 50 MG IS SUSR
3.2400 mL | Freq: Once | INTRAVESICAL | Status: AC
  Administered 2015-01-06: 81 mg via INTRAVESICAL

## 2015-01-06 NOTE — Progress Notes (Signed)
BCG Bladder Instillation  BCG # 3  Due to Bladder Cancer patient is present today for a BCG treatment. Patient was cleaned and prepped in a sterile fashion with betadine and lidocaine 2% jelly was instilled into the urethra.  A 14 FR catheter was inserted, urine return was noted 20 ml, urine was yellow in color.  71ml of reconstituted BCG was instilled into the bladder. The catheter was then removed. Patient tolerated well, no complications were noted  Preformed by: Vickki Hearing

## 2015-01-13 ENCOUNTER — Ambulatory Visit (INDEPENDENT_AMBULATORY_CARE_PROVIDER_SITE_OTHER): Payer: Medicare Other | Admitting: Urology

## 2015-01-13 ENCOUNTER — Encounter: Payer: Self-pay | Admitting: Urology

## 2015-01-13 VITALS — BP 169/68 | HR 65 | Ht 61.0 in | Wt 148.0 lb

## 2015-01-13 DIAGNOSIS — C679 Malignant neoplasm of bladder, unspecified: Secondary | ICD-10-CM

## 2015-01-13 DIAGNOSIS — C675 Malignant neoplasm of bladder neck: Secondary | ICD-10-CM

## 2015-01-13 LAB — URINALYSIS, COMPLETE
Bilirubin, UA: NEGATIVE
Glucose, UA: NEGATIVE
Ketones, UA: NEGATIVE
Nitrite, UA: NEGATIVE
Specific Gravity, UA: 1.02 (ref 1.005–1.030)
Urobilinogen, Ur: 0.2 mg/dL (ref 0.2–1.0)
pH, UA: 5.5 (ref 5.0–7.5)

## 2015-01-13 LAB — MICROSCOPIC EXAMINATION: Bacteria, UA: NONE SEEN

## 2015-01-13 MED ORDER — SODIUM CHLORIDE 0.9 % IJ SOLN
50.0000 mL | Freq: Once | INTRAMUSCULAR | Status: AC
  Administered 2015-01-13: 50 mL

## 2015-01-13 MED ORDER — LIDOCAINE HCL 2 % EX GEL
1.0000 "application " | Freq: Once | CUTANEOUS | Status: AC
  Administered 2015-01-13: 1 via URETHRAL

## 2015-01-13 MED ORDER — BCG LIVE 50 MG IS SUSR
3.2400 mL | Freq: Once | INTRAVESICAL | Status: AC
  Administered 2015-01-13: 81 mg via INTRAVESICAL

## 2015-01-13 NOTE — Progress Notes (Signed)
BCG Bladder Instillation  BCG # 4  Due to Bladder Cancer patient is present today for a BCG treatment. Patient was cleaned and prepped in a sterile fashion with betadine and lidocaine 2% jelly was instilled into the urethra.  A 14FR catheter was inserted, urine return was noted 4ml, urine was clear in color.  66ml of reconstituted BCG was instilled into the bladder. The catheter was then removed. Patient tolerated well, no complications were noted  Preformed by: Lyndee Hensen CMA and Zara Council PA-C  Follow up/ Additional notes: One week

## 2015-01-20 ENCOUNTER — Ambulatory Visit (INDEPENDENT_AMBULATORY_CARE_PROVIDER_SITE_OTHER): Payer: Medicare Other | Admitting: Urology

## 2015-01-20 VITALS — BP 166/64 | HR 76 | Wt 147.0 lb

## 2015-01-20 DIAGNOSIS — C675 Malignant neoplasm of bladder neck: Secondary | ICD-10-CM | POA: Diagnosis not present

## 2015-01-20 LAB — URINALYSIS, COMPLETE
BILIRUBIN UA: NEGATIVE
GLUCOSE, UA: NEGATIVE
KETONES UA: NEGATIVE
Nitrite, UA: NEGATIVE
SPEC GRAV UA: 1.015 (ref 1.005–1.030)
Urobilinogen, Ur: 0.2 mg/dL (ref 0.2–1.0)
pH, UA: 5.5 (ref 5.0–7.5)

## 2015-01-20 LAB — MICROSCOPIC EXAMINATION: Bacteria, UA: NONE SEEN

## 2015-01-20 MED ORDER — SODIUM CHLORIDE 0.9 % IJ SOLN
50.0000 mL | Freq: Once | INTRAMUSCULAR | Status: AC
  Administered 2015-01-20: 50 mL

## 2015-01-20 MED ORDER — LIDOCAINE HCL 2 % EX GEL
1.0000 "application " | Freq: Once | CUTANEOUS | Status: AC
  Administered 2015-01-20: 1 via URETHRAL

## 2015-01-20 MED ORDER — BCG LIVE 50 MG IS SUSR
3.2400 mL | Freq: Once | INTRAVESICAL | Status: AC
  Administered 2015-01-20: 81 mg via INTRAVESICAL

## 2015-01-20 NOTE — Progress Notes (Signed)
BCG Bladder Instillation  BCG # 4  Due to Bladder Cancer patient is present today for a BCG treatment. Patient was cleaned and prepped in a sterile fashion with betadine and lidocaine 2% jelly was instilled into the urethra.  A 14FR catheter was inserted, urine return was noted 23ml, urine was clear in color.  41ml of reconstituted BCG was instilled into the bladder. The catheter was then removed. Patient tolerated well, no complications were noted  Preformed by: Lyndee Hensen CMA and Zara Council PA-C  Follow up/ Additional notes: One week

## 2015-01-20 NOTE — Progress Notes (Signed)
12:17 PM   [name] April 24, 1933 [id number]  Referring provider: Kirk Ruths, MD [place], [place] [address]  Chief Complaint  Patient presents with  . Bladder Cancer    4th BCG    HPI: [name] is an 81 year old white female undergoing BCG treatment for a history of high grade T1, Tis of the right bladder neck status post repeat staging TURBT completed on 11/02/2014.  No evidence of upstaging her residual tumor.  She returns to the office today for her  BCG therapy #4  which was previously discussed. She has no voiding complaints today. No dysuria or gross hematuria. She had no difficulty retaining the installation for the full 2 hours at her last visit. She also denies any cough. Her UA is unchanged containing WBC is RBC's. Her last urine culture was negative.  Previous bladder cancer history: 81 year old nonsmoker with incidental 11 mm papillary neoplasm near the RIGHT bladder neck. She was taken to the operating room on 09/29/2014 for TURBT, Bilateral retrograde pyelogram at which time a 2 cm extremely spherical mass was identified just proximal to the RIGHT UO. This was resect ed along with deeper biopsies of the base. Of note, bilateral retrograde pyelogram was negative for any obvious upper tract filling defects or hydronephrosis.  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 The tumor had an inverted architecture. Deeper biopsies did contain muscle after speaking with the pathologist today and the muscle was negative for any tumor. Additionally, CIS was noted.  She returned to the operating room on 11/02/2014 for restaging TURBT. Pathology showed no evidence of residual tumor.  She denies a history of smoking or second hand smoking exposure. 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 She denies ever being exposed to textile dyes or any other  chemicals.     PMH: Past Medical History  Diagnosis Date  . Hypertension   . Anemia   . DDD (degenerative disc disease), cervical   . DDD (degenerative disc disease), lumbar   . Elevated lipids   . Osteoporosis   . Cancer     bladder tumor  . Glaucoma   . Multiple gastric ulcers   . Foot pain   . Colon polyp   . Chronic low back pain     Surgical History: Past Surgical History  Procedure Laterality Date  . Back surgery      x 6  . Carpal tunnel release Right   . Wrist fracture surgery Left   . Abdominal hysterectomy    . Knee arthroscopy Right     x2  . Mandible surgery Bilateral     x2  . Cataract extraction Bilateral   . Transurethral resection of bladder tumor N/A 09/29/2014    Procedure: TRANSURETHRAL RESECTION OF BLADDER TUMOR (TURBT);  Surgeon: Hollice Espy, MD;  Location: [facility];  Service: Urology;  Laterality: N/A;  . Cystoscopy w/ retrogrades Bilateral 09/29/2014    Procedure: CYSTOSCOPY WITH RETROGRADE PYELOGRAM;  Surgeon: Hollice Espy, MD;  Location: [facility];  Service: Urology;  Laterality: Bilateral;  . Eye surgery    . Cystoscopy with biopsy N/A 11/02/2014    Procedure: CYSTOSCOPY WITH BIOPSY/WITH MITOMYCIN;  Surgeon: Hollice Espy, MD;  Location: [facility];  Service: Urology;  Laterality: N/A;  . Foot surgery Right     Home Medications:    Medication List       This list is accurate as of:  01/13/15 11:59 PM.  Always use your most recent med list.               acetaminophen 650 MG CR tablet  Commonly known as:  TYLENOL  Take 650 mg by mouth every 8 (eight) hours as needed for pain.     calcium-vitamin D 500-200 MG-UNIT per tablet  Commonly known as:  OSCAL WITH D  Take 2 tablets by mouth 2 (two) times daily.     carvedilol 25 MG tablet  Commonly known as:  COREG  Take 25 mg by mouth 2 (two) times daily with a meal.     diltiazem 240 MG 24 hr capsule  Commonly known as:  CARDIZEM CD  Take 240 mg by mouth daily.     ferrous sulfate  325 (65 FE) MG tablet  Take 325 mg by mouth daily with breakfast.     latanoprost 0.005 % ophthalmic solution  Commonly known as:  XALATAN  Place 1 drop into both eyes at bedtime.     pantoprazole 40 MG tablet  Commonly known as:  PROTONIX     pravastatin 40 MG tablet  Commonly known as:  PRAVACHOL  Take 40 mg by mouth daily at 6 PM.     raloxifene 60 MG tablet  Commonly known as:  EVISTA  Take 60 mg by mouth daily.        Allergies:  Allergies  Allergen Reactions  . Keflex [Cephalexin]     Family History: Family History  Problem Relation Age of Onset  . Heart attack Father   . Emphysema Father   . Diabetes type II Sister   . Heart disease Mother   . Hypertension Mother   . Kidney disease Neg Hx   . Bladder Cancer Neg Hx     Social History:  reports that she has never smoked. She does not have any smokeless tobacco history on file. She reports that she does not drink alcohol or use illicit drugs.   Physical Exam: Blood pressure 169/68, pulse 65, height 5\' 1"  (1.549 m), weight 148 lb (67.132 kg).  Laboratory Data: Results for orders placed or performed in visit on 01/13/15  Microscopic Examination  Result Value Ref Range   WBC, UA 0-5 0 -  5 /hpf   RBC, UA 0-2 0 -  2 /hpf   Epithelial Cells (non renal) 0-10 0 - 10 /hpf   Bacteria, UA None seen None seen/Few  Urinalysis, Complete  Result Value Ref Range   Specific Gravity, UA 1.020 1.005 - 1.030   pH, UA 5.5 5.0 - 7.5   Color, UA Yellow Yellow   Appearance Ur Clear Clear   Leukocytes, UA 1+ (A) Negative   Protein, UA 1+ (A) Negative/Trace   Glucose, UA Negative Negative   Ketones, UA Negative Negative   RBC, UA 1+ (A) Negative   Bilirubin, UA Negative Negative   Urobilinogen, Ur 0.2 0.2 - 1.0 mg/dL   Nitrite, UA Negative Negative   Microscopic Examination See below:     Lab Results  Component Value Date   WBC 8.2 10/26/2014   HGB 8.6* 10/26/2014   HCT 28.0* 10/26/2014   MCV 78.4* 10/26/2014    PLT 370 10/26/2014   Assessment & Plan:    1. Malignant neoplasm of bladder neck:   Patient with a history of high-grade T1, Tis of the right bladder neck status post repeat staging TURBT on 11/02/2014.  No evidence of upstaging her residual tumor.  Currently undergoing  BCG therapy, dose #4 of 6 today.  - Urinalysis, Complete   No Follow-up on file.  Zara Council, [facility] [address], [place], [place] [address] [phone]

## 2015-01-20 NOTE — Progress Notes (Signed)
12:24 PM   [name] [birth year] [id number]  Referring provider: Kirk Ruths, MD [place], [place] [address]  Chief Complaint  Patient presents with  . BCG    HPI: [name] is an 81 year old white female undergoing BCG treatment for a history of high grade T1, Tis of the right bladder neck status post repeat staging TURBT completed on 11/02/2014.  No evidence of upstaging her residual tumor.  She returns to the office today for her  BCG therapy # 5 which was previously discussed. She has no voiding complaints today. No dysuria or gross hematuria. She had no difficulty retaining the installation for the full 2 hours at her last visit. She also denies any cough. Her UA is unchanged containing WBC is RBC's. Her last urine culture was negative.  Previous bladder cancer history: 81 year old nonsmoker with incidental 11 mm papillary neoplasm near the RIGHT bladder neck. She was taken to the operating room on 09/29/2014 for TURBT, Bilateral retrograde pyelogram at which time a 2 cm extremely spherical mass was identified just proximal to the RIGHT UO. This was resect ed along with deeper biopsies of the base. Of note, bilateral retrograde pyelogram was negative for any obvious upper tract filling defects or hydronephrosis.  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 The tumor had an inverted architecture. Deeper biopsies did contain muscle after speaking with the pathologist today and the muscle was negative for any tumor. Additionally, CIS was noted.  She returned to the operating room on 11/02/2014 for restaging TURBT. Pathology showed no evidence of residual tumor.  She denies a history of smoking or second hand smoking exposure. 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 She denies ever being exposed to textile dyes or any other chemicals.     PMH: Past Medical History    Diagnosis Date  . Hypertension   . Anemia   . DDD (degenerative disc disease), cervical   . DDD (degenerative disc disease), lumbar   . Elevated lipids   . Osteoporosis   . Cancer     bladder tumor  . Glaucoma   . Multiple gastric ulcers   . Foot pain   . Colon polyp   . Chronic low back pain     Surgical History: Past Surgical History  Procedure Laterality Date  . Back surgery      x 6  . Carpal tunnel release Right   . Wrist fracture surgery Left   . Abdominal hysterectomy    . Knee arthroscopy Right     x2  . Mandible surgery Bilateral     x2  . Cataract extraction Bilateral   . Transurethral resection of bladder tumor N/A 09/29/2014    Procedure: TRANSURETHRAL RESECTION OF BLADDER TUMOR (TURBT);  Surgeon: Elizabeth Espy, MD;  Location: [facility] ORS;  Service: Urology;  Laterality: N/A;  . Cystoscopy w/ retrogrades Bilateral 09/29/2014    Procedure: CYSTOSCOPY WITH RETROGRADE PYELOGRAM;  Surgeon: Elizabeth Espy, MD;  Location: [facility] ORS;  Service: Urology;  Laterality: Bilateral;  . Eye surgery    . Cystoscopy with biopsy N/A 11/02/2014    Procedure: CYSTOSCOPY WITH BIOPSY/WITH MITOMYCIN;  Surgeon: Elizabeth Espy, MD;  Location: [facility] ORS;  Service: Urology;  Laterality: N/A;  . Foot surgery Right     Home Medications:    Medication List       This list is accurate as of: 01/20/15 12:24 PM.  Always  use your most recent med list.               acetaminophen 650 MG CR tablet  Commonly known as:  TYLENOL  Take 650 mg by mouth every 8 (eight) hours as needed for pain.     calcium-vitamin D 500-200 MG-UNIT per tablet  Commonly known as:  OSCAL WITH D  Take 2 tablets by mouth 2 (two) times daily.     carvedilol 25 MG tablet  Commonly known as:  COREG  Take 25 mg by mouth 2 (two) times daily with a meal.     diltiazem 240 MG 24 hr capsule  Commonly known as:  CARDIZEM CD  Take 240 mg by mouth daily.     ferrous sulfate 325 (65 FE) MG tablet  Take 325 mg by mouth  daily with breakfast.     latanoprost 0.005 % ophthalmic solution  Commonly known as:  XALATAN  Place 1 drop into both eyes at bedtime.     pantoprazole 40 MG tablet  Commonly known as:  PROTONIX     pravastatin 40 MG tablet  Commonly known as:  PRAVACHOL  Take 40 mg by mouth daily at 6 PM.     raloxifene 60 MG tablet  Commonly known as:  EVISTA  Take 60 mg by mouth daily.        Allergies:  Allergies  Allergen Reactions  . Keflex [Cephalexin]     Family History: Family History  Problem Relation Age of Onset  . Heart attack Father   . Emphysema Father   . Diabetes type II Sister   . Heart disease Mother   . Hypertension Mother   . Kidney disease Neg Hx   . Bladder Cancer Neg Hx     Social History:  reports that she has never smoked. She does not have any smokeless tobacco history on file. She reports that she does not drink alcohol or use illicit drugs.   Physical Exam: Blood pressure 166/64, pulse 76, weight 147 lb (66.679 kg).  Laboratory Data: Results for orders placed or performed in visit on 01/20/15  Microscopic Examination  Result Value Ref Range   WBC, UA 6-10 (A) 0 -  5 /hpf   RBC, UA 3-10 (A) 0 -  2 /hpf   Epithelial Cells (non renal) 0-10 0 - 10 /hpf   Mucus, UA Present (A) Not Estab.   Bacteria, UA None seen None seen/Few  Urinalysis, Complete  Result Value Ref Range   Specific Gravity, UA 1.015 1.005 - 1.030   pH, UA 5.5 5.0 - 7.5   Color, UA Yellow Yellow   Appearance Ur Clear Clear   Leukocytes, UA 1+ (A) Negative   Protein, UA 1+ (A) Negative/Trace   Glucose, UA Negative Negative   Ketones, UA Negative Negative   RBC, UA 2+ (A) Negative   Bilirubin, UA Negative Negative   Urobilinogen, Ur 0.2 0.2 - 1.0 mg/dL   Nitrite, UA Negative Negative   Microscopic Examination See below:     Assessment & Plan:    1. Malignant neoplasm of bladder neck:   Patient with a history of high-grade T1, Tis of the right bladder neck status post repeat  staging TURBT on 11/02/2014.  No evidence of upstaging her residual tumor.  Currently undergoing BCG therapy, dose #5 of 6 today.  - Urinalysis, Complete   No Follow-up on file.  Elizabeth Council, PA-C  [facility] [address], [place], [place] [address] [phone]   

## 2015-01-20 NOTE — Progress Notes (Signed)
BCG Bladder Instillation  BCG # 5  Due to Bladder Cancer patient is present today for a BCG treatment. Patient was cleaned and prepped in a sterile fashion with betadine and lidocaine 2% jelly was instilled into the urethra.  A 14FR catheter was inserted, urine return was noted 62ml, urine was clear in color.  58ml of reconstituted BCG was instilled into the bladder. The catheter was then removed. Patient tolerated well, no complications were noted  Preformed by: Lyndee Hensen CMA and Zara Council PA-C  Follow up/ Additional notes: One week

## 2015-01-21 ENCOUNTER — Inpatient Hospital Stay: Payer: Medicare Other | Attending: Internal Medicine

## 2015-01-21 DIAGNOSIS — E041 Nontoxic single thyroid nodule: Secondary | ICD-10-CM | POA: Diagnosis not present

## 2015-01-21 DIAGNOSIS — K269 Duodenal ulcer, unspecified as acute or chronic, without hemorrhage or perforation: Secondary | ICD-10-CM | POA: Diagnosis not present

## 2015-01-21 DIAGNOSIS — I10 Essential (primary) hypertension: Secondary | ICD-10-CM | POA: Insufficient documentation

## 2015-01-21 DIAGNOSIS — K2960 Other gastritis without bleeding: Secondary | ICD-10-CM | POA: Diagnosis not present

## 2015-01-21 DIAGNOSIS — E785 Hyperlipidemia, unspecified: Secondary | ICD-10-CM | POA: Insufficient documentation

## 2015-01-21 DIAGNOSIS — G8929 Other chronic pain: Secondary | ICD-10-CM | POA: Insufficient documentation

## 2015-01-21 DIAGNOSIS — C679 Malignant neoplasm of bladder, unspecified: Secondary | ICD-10-CM | POA: Insufficient documentation

## 2015-01-21 DIAGNOSIS — R634 Abnormal weight loss: Secondary | ICD-10-CM | POA: Diagnosis not present

## 2015-01-21 DIAGNOSIS — Z79899 Other long term (current) drug therapy: Secondary | ICD-10-CM | POA: Diagnosis not present

## 2015-01-21 DIAGNOSIS — M545 Low back pain: Secondary | ICD-10-CM | POA: Diagnosis not present

## 2015-01-21 DIAGNOSIS — D509 Iron deficiency anemia, unspecified: Secondary | ICD-10-CM | POA: Diagnosis not present

## 2015-01-21 DIAGNOSIS — R5383 Other fatigue: Secondary | ICD-10-CM | POA: Diagnosis not present

## 2015-01-21 LAB — IRON AND TIBC
Iron: 23 ug/dL — ABNORMAL LOW (ref 28–170)
SATURATION RATIOS: 9 % — AB (ref 10.4–31.8)
TIBC: 246 ug/dL — AB (ref 250–450)
UIBC: 223 ug/dL

## 2015-01-21 LAB — CBC WITH DIFFERENTIAL/PLATELET
BASOS ABS: 0 10*3/uL (ref 0–0.1)
Basophils Relative: 0 %
Eosinophils Absolute: 0.1 10*3/uL (ref 0–0.7)
Eosinophils Relative: 1 %
HEMATOCRIT: 25.2 % — AB (ref 35.0–47.0)
Hemoglobin: 7.7 g/dL — ABNORMAL LOW (ref 12.0–16.0)
LYMPHS PCT: 22 %
Lymphs Abs: 1.3 10*3/uL (ref 1.0–3.6)
MCH: 23.3 pg — ABNORMAL LOW (ref 26.0–34.0)
MCHC: 30.5 g/dL — ABNORMAL LOW (ref 32.0–36.0)
MCV: 76.3 fL — ABNORMAL LOW (ref 80.0–100.0)
Monocytes Absolute: 0.5 10*3/uL (ref 0.2–0.9)
Monocytes Relative: 8 %
NEUTROS ABS: 4.3 10*3/uL (ref 1.4–6.5)
NEUTROS PCT: 69 %
PLATELETS: 348 10*3/uL (ref 150–440)
RBC: 3.31 MIL/uL — AB (ref 3.80–5.20)
RDW: 20 % — ABNORMAL HIGH (ref 11.5–14.5)
WBC: 6.2 10*3/uL (ref 3.6–11.0)

## 2015-01-21 LAB — FERRITIN: Ferritin: 91 ng/mL (ref 11–307)

## 2015-01-26 ENCOUNTER — Inpatient Hospital Stay: Payer: Medicare Other

## 2015-01-26 ENCOUNTER — Inpatient Hospital Stay (HOSPITAL_BASED_OUTPATIENT_CLINIC_OR_DEPARTMENT_OTHER): Payer: Medicare Other | Admitting: Internal Medicine

## 2015-01-26 VITALS — BP 155/71 | HR 71 | Temp 97.5°F | Resp 18 | Ht 61.0 in | Wt 146.8 lb

## 2015-01-26 DIAGNOSIS — E041 Nontoxic single thyroid nodule: Secondary | ICD-10-CM

## 2015-01-26 DIAGNOSIS — D509 Iron deficiency anemia, unspecified: Secondary | ICD-10-CM | POA: Insufficient documentation

## 2015-01-26 DIAGNOSIS — K269 Duodenal ulcer, unspecified as acute or chronic, without hemorrhage or perforation: Secondary | ICD-10-CM

## 2015-01-26 DIAGNOSIS — M545 Low back pain: Secondary | ICD-10-CM

## 2015-01-26 DIAGNOSIS — G8929 Other chronic pain: Secondary | ICD-10-CM

## 2015-01-26 DIAGNOSIS — K2960 Other gastritis without bleeding: Secondary | ICD-10-CM

## 2015-01-26 DIAGNOSIS — R634 Abnormal weight loss: Secondary | ICD-10-CM

## 2015-01-26 DIAGNOSIS — E785 Hyperlipidemia, unspecified: Secondary | ICD-10-CM

## 2015-01-26 DIAGNOSIS — C679 Malignant neoplasm of bladder, unspecified: Secondary | ICD-10-CM | POA: Diagnosis not present

## 2015-01-26 DIAGNOSIS — I10 Essential (primary) hypertension: Secondary | ICD-10-CM

## 2015-01-26 DIAGNOSIS — Z79899 Other long term (current) drug therapy: Secondary | ICD-10-CM

## 2015-01-26 DIAGNOSIS — R5383 Other fatigue: Secondary | ICD-10-CM

## 2015-01-26 MED ORDER — SODIUM CHLORIDE 0.9 % IV SOLN
500.0000 mg | Freq: Once | INTRAVENOUS | Status: AC
  Administered 2015-01-26: 500 mg via INTRAVENOUS
  Filled 2015-01-26: qty 25

## 2015-01-26 MED ORDER — SODIUM CHLORIDE 0.9 % IV SOLN
INTRAVENOUS | Status: DC
  Administered 2015-01-26: 10:00:00 via INTRAVENOUS
  Filled 2015-01-26: qty 1000

## 2015-01-26 NOTE — Progress Notes (Signed)
Patient is here for follow-up of IDA and IV Venofer treatment.  She states that she feels kind of "washed out" and does have a lot of energy.

## 2015-01-27 ENCOUNTER — Encounter: Payer: Self-pay | Admitting: Urology

## 2015-01-27 ENCOUNTER — Ambulatory Visit (INDEPENDENT_AMBULATORY_CARE_PROVIDER_SITE_OTHER): Payer: Medicare Other | Admitting: Obstetrics and Gynecology

## 2015-01-27 VITALS — BP 165/67 | HR 63 | Ht 61.0 in | Wt 146.5 lb

## 2015-01-27 DIAGNOSIS — C679 Malignant neoplasm of bladder, unspecified: Secondary | ICD-10-CM

## 2015-01-27 LAB — MICROSCOPIC EXAMINATION: Bacteria, UA: NONE SEEN

## 2015-01-27 LAB — URINALYSIS, COMPLETE
Bilirubin, UA: NEGATIVE
GLUCOSE, UA: NEGATIVE
Ketones, UA: NEGATIVE
Nitrite, UA: NEGATIVE
Specific Gravity, UA: 1.01 (ref 1.005–1.030)
UUROB: 0.2 mg/dL (ref 0.2–1.0)
pH, UA: 6.5 (ref 5.0–7.5)

## 2015-01-27 MED ORDER — LIDOCAINE HCL 2 % EX GEL
1.0000 "application " | Freq: Once | CUTANEOUS | Status: AC
  Administered 2015-01-27: 1 via URETHRAL

## 2015-01-27 MED ORDER — BCG LIVE 50 MG IS SUSR
3.2400 mL | Freq: Once | INTRAVESICAL | Status: AC
  Administered 2015-01-27: 81 mg via INTRAVESICAL

## 2015-01-27 MED ORDER — SODIUM CHLORIDE 0.9 % IJ SOLN
50.0000 mL | Freq: Once | INTRAMUSCULAR | Status: AC
  Administered 2015-01-27: 50 mL

## 2015-01-27 NOTE — Progress Notes (Signed)
01/27/2015 1:47 PM   [name] [birth year] [id number]  Referring provider: Kirk Ruths, MD [place], [place] [address]  Chief Complaint  Patient presents with  . Bladder Cancer    BCG 6    HPI: [name] is an 81 year old white female undergoing BCG treatment for a history of high grade T1, Tis of the right bladder neck status post repeat staging TURBT completed on 11/02/2014. No evidence of upstaging her residual tumor.  She returns to the office for completion of her course of BCG treatments.She has no voiding complaints today. No dysuria or gross hematuria. She had no difficulty retaining the installation for the full 2 hours at her last visit. She also denies any cough. Her UA is unremarkable. Her last urine culture was negative.  She reports she is overall feeling very well today.  Previous bladder cancer history: 81 year old nonsmoker with incidental 11 mm papillary neoplasm near the RIGHT bladder neck. She was taken to the operating room on 09/29/2014 for TURBT, Bilateral retrograde pyelogram at which time a 2 cm extremely spherical mass was identified just proximal to the RIGHT UO. This was resect ed along with deeper biopsies of the base. Of note, bilateral retrograde pyelogram was negative for any obvious upper tract filling defects or hydronephrosis.  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 The tumor had an inverted architecture. Deeper biopsies did contain muscle after speaking with the pathologist today and the muscle was negative for any tumor. Additionally, CIS was noted.  She returned to the operating room on 11/02/2014 for restaging TURBT. Pathology showed no evidence of residual tumor.  She denies a history of smoking or second hand smoking exposure. 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 She denies ever being exposed to textile dyes or any other  chemicals.   PMH: Past Medical History  Diagnosis Date  . Hypertension   . Anemia   . DDD (degenerative disc disease), cervical   . DDD (degenerative disc disease), lumbar   . Elevated lipids   . Osteoporosis   . Cancer     bladder tumor  . Glaucoma   . Multiple gastric ulcers   . Foot pain   . Colon polyp   . Chronic low back pain     Surgical History: Past Surgical History  Procedure Laterality Date  . Back surgery      x 6  . Carpal tunnel release Right   . Wrist fracture surgery Left   . Abdominal hysterectomy    . Knee arthroscopy Right     x2  . Mandible surgery Bilateral     x2  . Cataract extraction Bilateral   . Transurethral resection of bladder tumor N/A 09/29/2014    Procedure: TRANSURETHRAL RESECTION OF BLADDER TUMOR (TURBT);  Surgeon: Hollice Espy, MD;  Location: [facility];  Service: Urology;  Laterality: N/A;  . Cystoscopy w/ retrogrades Bilateral 09/29/2014    Procedure: CYSTOSCOPY WITH RETROGRADE PYELOGRAM;  Surgeon: Hollice Espy, MD;  Location: [facility];  Service: Urology;  Laterality: Bilateral;  . Eye surgery    . Cystoscopy with biopsy N/A 11/02/2014    Procedure: CYSTOSCOPY WITH BIOPSY/WITH MITOMYCIN;  Surgeon: Hollice Espy, MD;  Location: [facility];  Service: Urology;  Laterality: N/A;  . Foot surgery Right     Home Medications:    Medication List       This list is accurate as of: 01/27/15  1:47 PM.  Always use your most recent med list.               acetaminophen 650 MG CR tablet  Commonly known as:  TYLENOL  Take 650 mg by mouth every 8 (eight) hours as needed for pain.     calcium-vitamin D 500-200 MG-UNIT per tablet  Commonly known as:  OSCAL WITH D  Take 2 tablets by mouth 2 (two) times daily.     carvedilol 25 MG tablet  Commonly known as:  COREG  Take 25 mg by mouth 2 (two) times daily with a meal.     diltiazem 240 MG 24 hr capsule  Commonly known as:  CARDIZEM CD  Take 240 mg by mouth daily.     ferrous sulfate 325  (65 FE) MG tablet  Take 325 mg by mouth daily with breakfast.     latanoprost 0.005 % ophthalmic solution  Commonly known as:  XALATAN  Place 1 drop into both eyes at bedtime.     pantoprazole 40 MG tablet  Commonly known as:  PROTONIX     pravastatin 40 MG tablet  Commonly known as:  PRAVACHOL  Take 40 mg by mouth daily at 6 PM.     raloxifene 60 MG tablet  Commonly known as:  EVISTA  Take 60 mg by mouth daily.        Allergies:  Allergies  Allergen Reactions  . Keflex [Cephalexin]     Family History: Family History  Problem Relation Age of Onset  . Heart attack Father   . Emphysema Father   . Diabetes type II Sister   . Heart disease Mother   . Hypertension Mother   . Kidney disease Neg Hx   . Bladder Cancer Neg Hx     Social History:  reports that she has never smoked. She does not have any smokeless tobacco history on file. She reports that she does not drink alcohol or use illicit drugs.  ROS:   GU: Denies dysuria, frequency, gross hematuria or any other changes in urinary symptoms  Physical Exam: BP 165/67 mmHg  Pulse 63  Ht 5\' 1"  (1.549 m)  Wt 146 lb 8 oz (66.452 kg)  BMI 27.70 kg/m2  Constitutional:  Alert and oriented, No acute distress. HEENT: [place] AT, moist mucus membranes.  Trachea midline, no masses. Cardiovascular: No clubbing, cyanosis, or edema. Respiratory: Normal respiratory effort, no increased work of breathing Skin: No rashes, bruises or suspicious lesions. Lymph: No cervical adenopathy. Neurologic: Grossly intact, no focal deficits, moving all 4 extremities. Psychiatric: Normal mood and affect.  Laboratory Data: Lab Results  Component Value Date   WBC 6.2 01/21/2015   HGB 7.7* 01/21/2015   HCT 25.2* 01/21/2015   MCV 76.3* 01/21/2015   PLT 348 01/21/2015    Urinalysis Results for orders placed or performed in visit on 01/27/15  Microscopic Examination  Result Value Ref Range   WBC, UA 0-5 0 -  5 /hpf   RBC, UA 0-2 0 -  2  /hpf   Epithelial Cells (non renal) 0-10 0 - 10 /hpf   Bacteria, UA None seen None seen/Few  Urinalysis, Complete  Result Value Ref Range   Specific Gravity, UA 1.010 1.005 - 1.030   pH, UA 6.5 5.0 - 7.5   Color, UA Yellow Yellow   Appearance Ur Clear Clear   Leukocytes, UA 1+ (A) Negative   Protein, UA Trace (A) Negative/Trace   Glucose, UA Negative Negative  Ketones, UA Negative Negative   RBC, UA 1+ (A) Negative   Bilirubin, UA Negative Negative   Urobilinogen, Ur 0.2 0.2 - 1.0 mg/dL   Nitrite, UA Negative Negative   Microscopic Examination See below:    Assessment & Plan:   1. Malignant neoplasm of urinary bladder, unspecified site Patient with a history of high-grade T1, Tis of the right bladder neck status post repeat staging TURBT on 11/02/2014. No evidence of upstaging her residual tumor.Patient completed 6th BCG treatment to complete her initial course.  She will follow up in 3 months for surveillance cystoscopy.  - bcg vaccine injection 81 mg; Instill 3.24 mLs (81 mg total) into the bladder once. - lidocaine (XYLOCAINE) 2 % jelly 1 application; Place 1 application into the urethra once. - sodium chloride 0.9 % injection 50 mL; 50 mLs by Intracatheter route once. - Urinalysis, Complete   Return in about 3 months (around 04/28/2015) for cystoscopy with Dr. Erlene Quan.  Herbert Moors, [facility] [address] [place], [place] [address] [phone]

## 2015-01-27 NOTE — Progress Notes (Signed)
BCG Bladder Instillation  BCG # 6  Due to Bladder Cancer patient is present today for a BCG treatment. Patient was cleaned and prepped in a sterile fashion with betadine and lidocaine 2% jelly was instilled into the urethra.  A 14 FR catheter was inserted, urine return was noted 15 ml, urine was yellow in color.  51ml of reconstituted BCG was instilled into the bladder. The catheter was then removed. Patient tolerated well, no complications were noted  Preformed by: K Russell,CMA  Follow up: 83mths Cystoscopy

## 2015-02-02 ENCOUNTER — Other Ambulatory Visit: Payer: Self-pay | Admitting: Internal Medicine

## 2015-02-02 DIAGNOSIS — Z1231 Encounter for screening mammogram for malignant neoplasm of breast: Secondary | ICD-10-CM

## 2015-02-02 DIAGNOSIS — D509 Iron deficiency anemia, unspecified: Secondary | ICD-10-CM | POA: Insufficient documentation

## 2015-02-09 ENCOUNTER — Inpatient Hospital Stay: Payer: Medicare Other

## 2015-02-09 ENCOUNTER — Inpatient Hospital Stay: Payer: Medicare Other | Attending: Internal Medicine

## 2015-02-09 DIAGNOSIS — Z5181 Encounter for therapeutic drug level monitoring: Secondary | ICD-10-CM | POA: Diagnosis not present

## 2015-02-09 DIAGNOSIS — D509 Iron deficiency anemia, unspecified: Secondary | ICD-10-CM

## 2015-02-09 LAB — SAMPLE TO BLOOD BANK

## 2015-02-09 LAB — HEMOGLOBIN: Hemoglobin: 7.8 g/dL — ABNORMAL LOW (ref 12.0–16.0)

## 2015-02-09 MED ORDER — SODIUM CHLORIDE 0.9 % IV SOLN
500.0000 mg | Freq: Once | INTRAVENOUS | Status: AC
  Administered 2015-02-09: 500 mg via INTRAVENOUS
  Filled 2015-02-09: qty 25

## 2015-02-09 MED ORDER — SODIUM CHLORIDE 0.9 % IV SOLN
INTRAVENOUS | Status: DC
  Administered 2015-02-09: 10:00:00 via INTRAVENOUS
  Filled 2015-02-09: qty 1000

## 2015-02-10 NOTE — Progress Notes (Signed)
[facility]  [phone] [phone]     ID: ZINNIA TINDALL OB: [birth year]  MR#: [id number]  NFA#:[id number]  Patient Care Team: Kirk Ruths, MD as PCP - General (Internal Medicine)  CHIEF COMPLAINT/DIAGNOSIS:  1. Symptomatic anemia March 2016, likely due to erosive gastritis causing iron deficiency - received IV Venofer 500 mg on 08/04/14 in-hospital, then started on oral iron therapy.  EGD 08/05/14 - LA Grade A erosive esophagitis. Biopsied. Gastritis. Biopsied. Flattened mucosa was found in the duodenum, not consistent with celiac disease. Biopsied. Erosive gastritis.  2. Unintentional weight loss with workup March 2016 showing thyroid nodule and possible urinary bladder mass - underwent TURBT in May 2016 for high-grade superficial bladder cancer. Following with endocrinology for thyroid nodule.    HISTORY OF PRESENT ILLNESS:  Patient had follow-up labs on August 26 which shows hemoglobin has decreased to 7.7. She has evidence of recurrent iron deficiency. States that she has been feeling much more fatigued on exertion and sometimes at rest. She denies any obvious blood in stools or urine. No new bone pains. No fevers. Denies any new dyspnea at rest, orthopnea, angina or PND.   REVIEW OF SYSTEMS:   ROS As in HPI above. In addition, no fever, chills. No new headaches or focal weakness.  No new sore throat, cough, shortness of breath, sputum, hemoptysis or chest pain. No abdominal pain, constipation, diarrhea, dysuria or hematuria. No new skin rash. No new paresthesias in extremities.   PAST MEDICAL HISTORY: Reviewed. Past Medical History  Diagnosis Date  . Hypertension   . Anemia   . DDD (degenerative disc disease), cervical   . DDD (degenerative disc disease), lumbar   . Elevated lipids   . Osteoporosis   . Cancer     bladder tumor  . Glaucoma   . Multiple gastric ulcers   . Foot pain   . Colon polyp   . Chronic low back pain           Hypertension  Hyperlipidemia  Colon polyps s/p repeat colonoscopy in 03/2014, which was negative for polyps and showed internal hemorrhoids Cervical spondylosis   Chronic foot pain  Osteoporosis  Lumbar diskectomy  Hysterectomy decades ago  C-spine and back surgery, total of 6 back surgeries and spinal fusions.   Hospitalization March 2016 for  1. Symptomatic anemia, likely due to gastritis and duodenal ulcers causing iron deficiency. 2. Unintentional weight loss with workup showing thyroid nodule and possible urinary bladder mass.   PAST SURGICAL HISTORY: Reviewed. Past Surgical History  Procedure Laterality Date  . Back surgery      x 6  . Carpal tunnel release Right   . Wrist fracture surgery Left   . Abdominal hysterectomy    . Knee arthroscopy Right     x2  . Mandible surgery Bilateral     x2  . Cataract extraction Bilateral   . Transurethral resection of bladder tumor N/A 09/29/2014    Procedure: TRANSURETHRAL RESECTION OF BLADDER TUMOR (TURBT);  Surgeon: Hollice Espy, MD;  Location: [facility] ORS;  Service: Urology;  Laterality: N/A;  . Cystoscopy w/ retrogrades Bilateral 09/29/2014    Procedure: CYSTOSCOPY WITH RETROGRADE PYELOGRAM;  Surgeon: Hollice Espy, MD;  Location: [facility] ORS;  Service: Urology;  Laterality: Bilateral;  . Eye surgery    . Cystoscopy with biopsy N/A 11/02/2014    Procedure: CYSTOSCOPY WITH BIOPSY/WITH MITOMYCIN;  Surgeon: Hollice Espy, MD;  Location: [facility] ORS;  Service: Urology;  Laterality: N/A;  . Foot  surgery Right     FAMILY HISTORY: Reviewed. Remarkable for heart disease, hypertension, emphysema and diabetes  SOCIAL HISTORY: Reviewed. Denies smoking, alcohol or recreational drug usage.  Allergies  Allergen Reactions  . Keflex [Cephalexin]     Current Outpatient Prescriptions  Medication Sig Dispense Refill  . acetaminophen (TYLENOL) 650 MG CR tablet Take 650 mg by mouth every 8 (eight) hours as needed for pain.    . calcium-vitamin D  (OSCAL WITH D) 500-200 MG-UNIT per tablet Take 2 tablets by mouth 2 (two) times daily.     . carvedilol (COREG) 25 MG tablet Take 25 mg by mouth 2 (two) times daily with a meal.    . diltiazem (CARDIZEM CD) 240 MG 24 hr capsule Take 240 mg by mouth daily.    . ferrous sulfate 325 (65 FE) MG tablet Take 325 mg by mouth daily with breakfast.    . latanoprost (XALATAN) 0.005 % ophthalmic solution Place 1 drop into both eyes at bedtime.    . pantoprazole (PROTONIX) 40 MG tablet   0  . pravastatin (PRAVACHOL) 40 MG tablet Take 40 mg by mouth daily at 6 PM.    . raloxifene (EVISTA) 60 MG tablet Take 60 mg by mouth daily.     No current facility-administered medications for this visit.    PHYSICAL EXAM: Filed Vitals:   01/26/15 0850  BP: 155/71  Pulse: 71  Temp: 97.5 F (36.4 C)  Resp: 18     Body mass index is 27.76 kg/(m^2).      GENERAL: Patient is alert and oriented and in no acute distress. There is no icterus. Pallor present. CVS: S1S2, regular LUNGS: Bilaterally clear to auscultation, no rhonchi. ABDOMEN: Soft, nontender.    EXTREMITIES: No pedal edema.  LAB RESULTS: 01/21/15 - serum iron 23, and saturation 90%, ferritin 91, TIBC 246, hemoglobin 7.7, MCV 76.3.  Lab Results  Component Value Date   WBC 6.2 01/21/2015   NEUTROABS 4.3 01/21/2015   HGB 7.8* 02/09/2015   HCT 25.2* 01/21/2015   MCV 76.3* 01/21/2015   PLT 348 01/21/2015   10/26/2014 - hemoglobin 8.6, WBC 8200, 76% neutrophils, platelets 370, serum iron low at 16, iron saturation low at 6%, ferritin 84.  09/29/14 - TURBT  Surgical Pathology Report.   DIAGNOSIS:   A. URINARY BLADDER, MASS; TURBT:  UROTHELIAL CARCINOMA, INVASIVE INTO LAMINA PROPRIA, HIGH GRADE, AND INVERTED.  FOCI OF FLAT CARCINOMA IN SITU ARE NOTED.  NO MUSCULARIS PROPRIA PRESENT.   B. URINARY BLADDER, TUMOR BASE; BIOPSY: NO MALIGNANCY SEEN.    STUDIES: 08/04/14 - CT scan of chest/abdomen/pelvis. 1.  No acute process in the chest. 2.   Atherosclerosis, including within the coronary arteries. 3. Tiny nonspecific posterior right upper lobe pulmonary nodule which can be re-evaluated on follow-up. 4. Right thyroid nodule which warrants further evaluation with thyroid ultrasound. 1. Right bladder base nodule, highly suspicious for transitional cell carcinoma. These results will be called to the ordering clinician or representative by the Radiologist Assistant, and communication documented in the PACS or zVision Dashboard. 2. No evidence of metastatic disease within the abdomen or pelvis. 3. Postsurgical changes within the lumbar spine. Increased (since 05/18/2014) in nonspecific multiloculated postoperative fluid collection/collections.  08/05/14 - US Thyroid - IMPRESSION: Bilateral nodules. The dominant nodule in the lower pole of the right lobe measures 2.1 cm. Findings meet consensus criteria for biopsy. Ultrasound-guided fine needle aspiration should be considered.  ASSESSMENT / PLAN:   1. Symptomatic anemia March 2016, likely due  to erosive gastritis causing iron deficiency. Received IV Venofer 500 mg on 08/04/14 in-hospital, then started on oral iron therapy  -  recent labs from August 26 shows hemoglobin has again drifted down to 7.7 and she has evidence of iron deficiency serum iron of 23 and iron saturation of 9%. Plan this for [place] to pursue another course of parenteral iron therapy with Venofer 500 mg IV 2 doses given 2 weeks apart. Patient explained about this plan and she is agreeable to pursue this treatment. She was also advised to follow up with GI given recommended by deficiency anemia. Will get lab in 2 weeks and Venofer Rx (4 hours). Repeat lab at 6 weeks. Keep scheduled MD appt on 04/15/15 the same. 2. Unintentional weight loss with workup March 2016 showing thyroid nodule and possible urinary bladder mass - Patient following with urology, had TURBT which showed superficial bladder cancer, states that she is being planned for  intravesical chemotherapy. She is following with endocrinology for thyroid nodule. Overall has lost 1-2 pounds since March, stated that she would try to eat better and notify us if she has continued unintentional weight loss. 3. In between visits, patient advised to call or come to ER in case of any fevers, progressive symptoms or acute sickness. She is agreeable to this plan.    Leia Alf, MD   02/10/2015 9:08 PM

## 2015-03-02 ENCOUNTER — Inpatient Hospital Stay: Payer: Medicare Other | Attending: Internal Medicine

## 2015-03-02 ENCOUNTER — Inpatient Hospital Stay (HOSPITAL_BASED_OUTPATIENT_CLINIC_OR_DEPARTMENT_OTHER): Payer: Medicare Other | Admitting: Internal Medicine

## 2015-03-02 VITALS — BP 183/71 | HR 73 | Temp 98.5°F | Wt 150.4 lb

## 2015-03-02 DIAGNOSIS — K269 Duodenal ulcer, unspecified as acute or chronic, without hemorrhage or perforation: Secondary | ICD-10-CM | POA: Diagnosis not present

## 2015-03-02 DIAGNOSIS — K2960 Other gastritis without bleeding: Secondary | ICD-10-CM | POA: Diagnosis not present

## 2015-03-02 DIAGNOSIS — C801 Malignant (primary) neoplasm, unspecified: Secondary | ICD-10-CM

## 2015-03-02 DIAGNOSIS — D649 Anemia, unspecified: Secondary | ICD-10-CM

## 2015-03-02 DIAGNOSIS — D509 Iron deficiency anemia, unspecified: Secondary | ICD-10-CM

## 2015-03-02 DIAGNOSIS — G8929 Other chronic pain: Secondary | ICD-10-CM | POA: Diagnosis not present

## 2015-03-02 DIAGNOSIS — R634 Abnormal weight loss: Secondary | ICD-10-CM | POA: Insufficient documentation

## 2015-03-02 DIAGNOSIS — Z79899 Other long term (current) drug therapy: Secondary | ICD-10-CM | POA: Diagnosis not present

## 2015-03-02 DIAGNOSIS — I10 Essential (primary) hypertension: Secondary | ICD-10-CM | POA: Insufficient documentation

## 2015-03-02 DIAGNOSIS — M810 Age-related osteoporosis without current pathological fracture: Secondary | ICD-10-CM

## 2015-03-02 DIAGNOSIS — E041 Nontoxic single thyroid nodule: Secondary | ICD-10-CM

## 2015-03-02 DIAGNOSIS — M545 Low back pain: Secondary | ICD-10-CM | POA: Insufficient documentation

## 2015-03-02 DIAGNOSIS — R1310 Dysphagia, unspecified: Secondary | ICD-10-CM

## 2015-03-02 DIAGNOSIS — E785 Hyperlipidemia, unspecified: Secondary | ICD-10-CM

## 2015-03-04 ENCOUNTER — Ambulatory Visit
Admission: RE | Admit: 2015-03-04 | Discharge: 2015-03-04 | Disposition: A | Discharge status: Home / Self Care | Payer: Medicare Other | Source: Ambulatory Visit | Attending: Internal Medicine | Admitting: Internal Medicine

## 2015-03-04 DIAGNOSIS — Z1231 Encounter for screening mammogram for malignant neoplasm of breast: Secondary | ICD-10-CM | POA: Insufficient documentation

## 2015-03-08 NOTE — Progress Notes (Signed)
[place]  Telephone:(336) [phone] Fax:(336) [phone]     ID: ARYANI DAFFERN OB: 17-May-1933  MR#: [id number]  WER#:[id number]  Patient Care Team: Kirk Ruths, MD as PCP - General (Internal Medicine)  CHIEF COMPLAINT/DIAGNOSIS:  1. Symptomatic anemia March 2016, likely due to erosive gastritis causing iron deficiency - received IV Venofer 500 mg on 08/04/14 in-hospital, then started on oral iron therapy. Then got IV Venofer 500 mg x 2 doses Aug/Sept 2016.  EGD 08/05/14 - LA Grade A erosive esophagitis. Biopsied. Gastritis. Biopsied. Flattened mucosa was found in the duodenum, not consistent with celiac disease. Biopsied. Erosive gastritis.  2. Unintentional weight loss with workup March 2016 showing thyroid nodule and possible urinary bladder mass - underwent TURBT in May 2016 for high-grade superficial bladder cancer. Following with endocrinology for thyroid nodule.    HISTORY OF PRESENT ILLNESS:  Patient came for opinion today, she has thyroid noudles and is followed by endocrinologist Dr.Solum. States she had recent d/w Dr.Solum and was given 2 options of continued surveillance versus considering thyroidectomy followed by thyroid hormone replacement therapy for life. Clinically states she has dysphagia frequently at the level of thyroid gland. Otherwise no other thyroid related symptoms. She is followed here for anemia, denies any new anemia symptoms. Eating steady otherwise. Denies any new dyspnea at rest, orthopnea, angina or PND.   REVIEW OF SYSTEMS:   ROS As in HPI above. In addition, no fevers. No new headaches or focal weakness.  No new sore throat, cough, shortness of breath, sputum, hemoptysis or chest pain. No abdominal pain, constipation, diarrhea.   PAST MEDICAL HISTORY: Reviewed. Past Medical History  Diagnosis Date  . Hypertension   . Anemia   . DDD (degenerative disc disease), cervical   . DDD (degenerative disc disease), lumbar   . Elevated  lipids   . Osteoporosis   . Glaucoma   . Multiple gastric ulcers   . Foot pain   . Colon polyp   . Chronic low back pain   . Cancer [facility]) 2016    bladder tumor          Hypertension  Hyperlipidemia  Colon polyps s/p repeat colonoscopy in 03/2014, which was negative for polyps and showed internal hemorrhoids Cervical spondylosis   Chronic foot pain  Osteoporosis  Lumbar diskectomy  Hysterectomy decades ago  C-spine and back surgery, total of 6 back surgeries and spinal fusions.   Hospitalization March 2016 for  1. Symptomatic anemia, likely due to gastritis and duodenal ulcers causing iron deficiency. 2. Unintentional weight loss with workup showing thyroid nodule and possible urinary bladder mass.   PAST SURGICAL HISTORY: Reviewed. Past Surgical History  Procedure Laterality Date  . Back surgery      x 6  . Carpal tunnel release Right   . Wrist fracture surgery Left   . Abdominal hysterectomy    . Knee arthroscopy Right     x2  . Mandible surgery Bilateral     x2  . Cataract extraction Bilateral   . Transurethral resection of bladder tumor N/A 09/29/2014    Procedure: TRANSURETHRAL RESECTION OF BLADDER TUMOR (TURBT);  Surgeon: Hollice Espy, MD;  Location: [facility] ORS;  Service: Urology;  Laterality: N/A;  . Cystoscopy w/ retrogrades Bilateral 09/29/2014    Procedure: CYSTOSCOPY WITH RETROGRADE PYELOGRAM;  Surgeon: Hollice Espy, MD;  Location: [facility] ORS;  Service: Urology;  Laterality: Bilateral;  . Eye surgery    . Cystoscopy with biopsy N/A 11/02/2014    Procedure: CYSTOSCOPY  WITH BIOPSY/WITH MITOMYCIN;  Surgeon: Hollice Espy, MD;  Location: [facility] ORS;  Service: Urology;  Laterality: N/A;  . Foot surgery Right     FAMILY HISTORY: Reviewed. Remarkable for heart disease, hypertension, emphysema and diabetes  SOCIAL HISTORY: Reviewed. Denies smoking, alcohol or recreational drug usage.  Allergies  Allergen Reactions  . Keflex [Cephalexin]     Current Outpatient  Prescriptions  Medication Sig Dispense Refill  . acetaminophen (TYLENOL) 650 MG CR tablet Take 650 mg by mouth every 8 (eight) hours as needed for pain.    . Ascorbic Acid (VITAMIN C) 1000 MG tablet Take 1,000 mg by mouth daily.    . calcium-vitamin D (OSCAL WITH D) 500-200 MG-UNIT per tablet Take 2 tablets by mouth 2 (two) times daily.     . carvedilol (COREG) 25 MG tablet Take 25 mg by mouth 2 (two) times daily with a meal.    . diltiazem (CARDIZEM CD) 240 MG 24 hr capsule Take 240 mg by mouth daily.    . ferrous sulfate 325 (65 FE) MG tablet Take 325 mg by mouth daily with breakfast.    . latanoprost (XALATAN) 0.005 % ophthalmic solution Place 1 drop into both eyes at bedtime.    . pantoprazole (PROTONIX) 40 MG tablet   0  . pravastatin (PRAVACHOL) 40 MG tablet Take 40 mg by mouth daily at 6 PM.    . raloxifene (EVISTA) 60 MG tablet Take 60 mg by mouth daily.     No current facility-administered medications for this visit.    PHYSICAL EXAM: Filed Vitals:   03/02/15 1614  BP: 183/71  Pulse: 73  Temp: 98.5 F (36.9 C)     Body mass index is 28.42 kg/(m^2).      GENERAL: Patient is alert and oriented and in no acute distress. No icterus.   HEENT: EOMs intact. No neck masses or thyromegaly.  LUNGS: Bilaterally clear to auscultation, no rhonchi. ABDOMEN: Soft, nontender.    EXTREMITIES: No pedal edema.   LAB RESULTS: 01/21/15 - serum iron 23, and saturation 90%, ferritin 91, TIBC 246, hemoglobin 7.7, MCV 76.3.  Lab Results  Component Value Date   WBC 6.2 01/21/2015   NEUTROABS 4.3 01/21/2015   HGB 7.8* 02/09/2015   HCT 25.2* 01/21/2015   MCV 76.3* 01/21/2015   PLT 348 01/21/2015   10/26/2014 - hemoglobin 8.6, WBC 8200, 76% neutrophils, platelets 370, serum iron low at 16, iron saturation low at 6%, ferritin 84.  09/29/14 - TURBT  Surgical Pathology Report.   DIAGNOSIS:   A. URINARY BLADDER, MASS; TURBT:  UROTHELIAL CARCINOMA, INVASIVE INTO LAMINA PROPRIA, HIGH GRADE, AND  INVERTED.  FOCI OF FLAT CARCINOMA IN SITU ARE NOTED.  NO MUSCULARIS PROPRIA PRESENT.   B. URINARY BLADDER, TUMOR BASE; BIOPSY: NO MALIGNANCY SEEN.    STUDIES: 08/04/14 - CT scan of chest/abdomen/pelvis. 1.  No acute process in the chest. 2.  Atherosclerosis, including within the coronary arteries. 3. Tiny nonspecific posterior right upper lobe pulmonary nodule which can be re-evaluated on follow-up. 4. Right thyroid nodule which warrants further evaluation with thyroid ultrasound. 1. Right bladder base nodule, highly suspicious for transitional cell carcinoma. These results will be called to the ordering clinician or representative by the Radiologist Assistant, and communication documented in the PACS or zVision Dashboard. 2. No evidence of metastatic disease within the abdomen or pelvis. 3. Postsurgical changes within the lumbar spine. Increased (since 05/18/2014) in nonspecific multiloculated postoperative fluid collection/collections.  08/05/14 - US Thyroid - IMPRESSION: Bilateral nodules.  The dominant nodule in the lower pole of the right lobe measures 2.1 cm. Findings meet consensus criteria for biopsy. Ultrasound-guided fine needle aspiration should be considered.  ASSESSMENT / PLAN:   1.  Symptomatic anemia March 2016, likely due to erosive gastritis causing iron deficiency. Received IV Venofer 500 mg on 08/04/14 in-hospital, then started on oral iron therapy  -  recent labs from August 26 shows hemoglobin has again drifted down to 7.7 and she has evidence of iron deficiency serum iron of 23 and iron saturation of 9%. She got recent another course of parenteral iron therapy with Venofer 500 mg IV 2 doses. She follows with GI also. She was advised to keep scheduled MD/lab appt on 04/15/15 the same.  2.  Unintentional weight loss with workup March 2016 showing thyroid nodule and possible urinary bladder mass - Patient following with urology, had TURBT which showed superficial bladder cancer, she is  on a course of intravesical chemotherapy. She is following with endocrinology for thyroid nodule and came here for oncology opinion regarding this, have explained that given recurrent dysphagia it would be one indication to consider evaluation for thyroidectomy, otherwise it is re-assuring that thyroid nodule biopsy earlier this year was negative for malignancy. States she will think about it and d/w Dr.Solum if she wishes to go and meet with ENT.   3.  In between visits, patient advised to call or come to ER in case of any fevers, progressive symptoms or acute sickness. She is agreeable to this plan.    Leia Alf, MD   03/08/2015 11:52 AM

## 2015-03-09 ENCOUNTER — Other Ambulatory Visit: Payer: Medicare Other

## 2015-03-14 ENCOUNTER — Other Ambulatory Visit: Payer: Self-pay | Admitting: Unknown Physician Specialty

## 2015-03-14 DIAGNOSIS — R1310 Dysphagia, unspecified: Secondary | ICD-10-CM

## 2015-03-17 ENCOUNTER — Ambulatory Visit
Admission: RE | Admit: 2015-03-17 | Discharge: 2015-03-17 | Disposition: A | Discharge status: Home / Self Care | Payer: Medicare Other | Source: Ambulatory Visit | Attending: Unknown Physician Specialty | Admitting: Unknown Physician Specialty

## 2015-03-17 DIAGNOSIS — R1310 Dysphagia, unspecified: Secondary | ICD-10-CM | POA: Diagnosis present

## 2015-03-17 DIAGNOSIS — K219 Gastro-esophageal reflux disease without esophagitis: Secondary | ICD-10-CM | POA: Insufficient documentation

## 2015-03-17 DIAGNOSIS — K449 Diaphragmatic hernia without obstruction or gangrene: Secondary | ICD-10-CM | POA: Insufficient documentation

## 2015-03-30 DIAGNOSIS — M7540 Impingement syndrome of unspecified shoulder: Secondary | ICD-10-CM | POA: Insufficient documentation

## 2015-03-30 DIAGNOSIS — M7541 Impingement syndrome of right shoulder: Secondary | ICD-10-CM | POA: Insufficient documentation

## 2015-04-15 ENCOUNTER — Other Ambulatory Visit: Payer: Medicare Other

## 2015-04-15 ENCOUNTER — Ambulatory Visit: Payer: Medicare Other | Admitting: Internal Medicine

## 2015-04-20 ENCOUNTER — Inpatient Hospital Stay: Payer: Medicare Other | Attending: Internal Medicine

## 2015-04-20 ENCOUNTER — Other Ambulatory Visit: Payer: Self-pay | Admitting: *Deleted

## 2015-04-20 ENCOUNTER — Encounter: Payer: Self-pay | Admitting: Internal Medicine

## 2015-04-20 ENCOUNTER — Inpatient Hospital Stay (HOSPITAL_BASED_OUTPATIENT_CLINIC_OR_DEPARTMENT_OTHER): Payer: Medicare Other | Admitting: Internal Medicine

## 2015-04-20 DIAGNOSIS — Z86010 Personal history of colonic polyps: Secondary | ICD-10-CM

## 2015-04-20 DIAGNOSIS — R634 Abnormal weight loss: Secondary | ICD-10-CM

## 2015-04-20 DIAGNOSIS — M810 Age-related osteoporosis without current pathological fracture: Secondary | ICD-10-CM | POA: Diagnosis not present

## 2015-04-20 DIAGNOSIS — Z9221 Personal history of antineoplastic chemotherapy: Secondary | ICD-10-CM | POA: Insufficient documentation

## 2015-04-20 DIAGNOSIS — I10 Essential (primary) hypertension: Secondary | ICD-10-CM

## 2015-04-20 DIAGNOSIS — K2960 Other gastritis without bleeding: Secondary | ICD-10-CM | POA: Diagnosis not present

## 2015-04-20 DIAGNOSIS — M549 Dorsalgia, unspecified: Secondary | ICD-10-CM | POA: Insufficient documentation

## 2015-04-20 DIAGNOSIS — D509 Iron deficiency anemia, unspecified: Secondary | ICD-10-CM

## 2015-04-20 DIAGNOSIS — E042 Nontoxic multinodular goiter: Secondary | ICD-10-CM | POA: Diagnosis not present

## 2015-04-20 DIAGNOSIS — Z79899 Other long term (current) drug therapy: Secondary | ICD-10-CM | POA: Insufficient documentation

## 2015-04-20 DIAGNOSIS — C679 Malignant neoplasm of bladder, unspecified: Secondary | ICD-10-CM | POA: Insufficient documentation

## 2015-04-20 DIAGNOSIS — E785 Hyperlipidemia, unspecified: Secondary | ICD-10-CM | POA: Insufficient documentation

## 2015-04-20 DIAGNOSIS — K269 Duodenal ulcer, unspecified as acute or chronic, without hemorrhage or perforation: Secondary | ICD-10-CM | POA: Diagnosis not present

## 2015-04-20 DIAGNOSIS — G8929 Other chronic pain: Secondary | ICD-10-CM | POA: Insufficient documentation

## 2015-04-20 LAB — CBC WITH DIFFERENTIAL/PLATELET
BASOS ABS: 0 10*3/uL (ref 0–0.1)
BASOS PCT: 1 %
EOS ABS: 0.1 10*3/uL (ref 0–0.7)
EOS PCT: 1 %
HCT: 29.1 % — ABNORMAL LOW (ref 35.0–47.0)
HEMOGLOBIN: 9.2 g/dL — AB (ref 12.0–16.0)
Lymphocytes Relative: 27 %
Lymphs Abs: 1.6 10*3/uL (ref 1.0–3.6)
MCH: 26.6 pg (ref 26.0–34.0)
MCHC: 31.6 g/dL — AB (ref 32.0–36.0)
MCV: 84.1 fL (ref 80.0–100.0)
MONO ABS: 0.3 10*3/uL (ref 0.2–0.9)
Monocytes Relative: 6 %
NEUTROS ABS: 3.9 10*3/uL (ref 1.4–6.5)
Neutrophils Relative %: 65 %
Platelets: 282 10*3/uL (ref 150–440)
RBC: 3.46 MIL/uL — ABNORMAL LOW (ref 3.80–5.20)
RDW: 20.2 % — AB (ref 11.5–14.5)
WBC: 6 10*3/uL (ref 3.6–11.0)

## 2015-04-20 LAB — IRON AND TIBC
Iron: 35 ug/dL (ref 28–170)
Saturation Ratios: 15 % (ref 10.4–31.8)
TIBC: 234 ug/dL — ABNORMAL LOW (ref 250–450)
UIBC: 199 ug/dL

## 2015-04-20 LAB — SAMPLE TO BLOOD BANK

## 2015-04-20 LAB — FERRITIN: Ferritin: 336 ng/mL — ABNORMAL HIGH (ref 11–307)

## 2015-04-20 NOTE — Progress Notes (Signed)
[place]  Telephone:(336) [phone] Fax:(336) [phone]     ID: [name] OB: [birth year]  MR#: [id number]  EXH#:[id number]  Patient Care Team: Kirk Ruths, MD as PCP - General (Internal Medicine)  CHIEF COMPLAINT/DIAGNOSIS:  1. Symptomatic anemia March 2016, likely due to erosive gastritis causing iron deficiency - received IV Venofer 500 mg on 08/04/14 in-hospital, then started on oral iron therapy. Then got IV Venofer 500 mg x 2 doses Aug/Sept 2016.  EGD 08/05/14 - LA Grade A erosive esophagitis. Biopsied. Gastritis. Biopsied. Flattened mucosa was found in the duodenum, not consistent with celiac disease. Biopsied. Erosive gastritis.  2. Unintentional weight loss with workup March 2016 showing thyroid nodule and possible urinary bladder mass - underwent TURBT in May 2016 for high-grade superficial bladder cancer. Following with endocrinology for thyroid nodule.    HISTORY OF PRESENT ILLNESS:  [name] returns to our clinic for a follow-up visit. She has done better since her last appointment, been able to perform all activities of daily living, with only limitation being persistent back pain, which she attributes to multiple prior spine surgeries. Her appetite is good, she denies any additional weight loss, nausea, vomiting, diarrhea, constipation, chest pain, shortness of breath. REVIEW OF SYSTEMS:   ROS As in HPI above.  PAST MEDICAL HISTORY: Reviewed. Past Medical History  Diagnosis Date  . Hypertension   . Anemia   . DDD (degenerative disc disease), cervical   . DDD (degenerative disc disease), lumbar   . Elevated lipids   . Osteoporosis   . Glaucoma   . Multiple gastric ulcers   . Foot pain   . Colon polyp   . Chronic low back pain   . Cancer [facility]) 2016    bladder tumor          Hypertension  Hyperlipidemia  Colon polyps s/p repeat colonoscopy in 03/2014, which was negative for polyps and showed internal hemorrhoids Cervical  spondylosis   Chronic foot pain  Osteoporosis  Lumbar diskectomy  Hysterectomy decades ago  C-spine and back surgery, total of 6 back surgeries and spinal fusions.   Hospitalization March 2016 for  1. Symptomatic anemia, likely due to gastritis and duodenal ulcers causing iron deficiency. 2. Unintentional weight loss with workup showing thyroid nodule and possible urinary bladder mass.   PAST SURGICAL HISTORY: Reviewed. Past Surgical History  Procedure Laterality Date  . Back surgery      x 6  . Carpal tunnel release Right   . Wrist fracture surgery Left   . Abdominal hysterectomy    . Knee arthroscopy Right     x2  . Mandible surgery Bilateral     x2  . Cataract extraction Bilateral   . Transurethral resection of bladder tumor N/A 09/29/2014    Procedure: TRANSURETHRAL RESECTION OF BLADDER TUMOR (TURBT);  Surgeon: Hollice Espy, MD;  Location: [facility] ORS;  Service: Urology;  Laterality: N/A;  . Cystoscopy w/ retrogrades Bilateral 09/29/2014    Procedure: CYSTOSCOPY WITH RETROGRADE PYELOGRAM;  Surgeon: Hollice Espy, MD;  Location: [facility] ORS;  Service: Urology;  Laterality: Bilateral;  . Eye surgery    . Cystoscopy with biopsy N/A 11/02/2014    Procedure: CYSTOSCOPY WITH BIOPSY/WITH MITOMYCIN;  Surgeon: Hollice Espy, MD;  Location: [facility] ORS;  Service: Urology;  Laterality: N/A;  . Foot surgery Right     FAMILY HISTORY: Reviewed. Remarkable for heart disease, hypertension, emphysema and diabetes  SOCIAL HISTORY: Reviewed. Denies smoking, alcohol or recreational drug usage.  Allergies  Allergen  Reactions  . Keflex [Cephalexin]     Current Outpatient Prescriptions  Medication Sig Dispense Refill  . acetaminophen (TYLENOL) 650 MG CR tablet Take 650 mg by mouth every 8 (eight) hours as needed for pain.    . Ascorbic Acid (VITAMIN C) 1000 MG tablet Take 1,000 mg by mouth daily.    . calcium-vitamin D (OSCAL WITH D) 500-200 MG-UNIT per tablet Take 2 tablets by mouth 2 (two) times  daily.     . carvedilol (COREG) 25 MG tablet Take 25 mg by mouth 2 (two) times daily with a meal.    . diltiazem (CARDIZEM CD) 240 MG 24 hr capsule Take 240 mg by mouth daily.    . ferrous sulfate 325 (65 FE) MG tablet Take 325 mg by mouth daily with breakfast.    . latanoprost (XALATAN) 0.005 % ophthalmic solution Place 1 drop into both eyes at bedtime.    . pantoprazole (PROTONIX) 40 MG tablet   0  . pravastatin (PRAVACHOL) 40 MG tablet Take 40 mg by mouth daily at 6 PM.    . raloxifene (EVISTA) 60 MG tablet Take 60 mg by mouth daily.     No current facility-administered medications for this visit.    PHYSICAL EXAM: There were no vitals filed for this visit.   There is no weight on file to calculate BMI.      GENERAL: Patient is alert and oriented and in no acute distress. No icterus.   HEENT: EOMs intact. No neck masses or thyromegaly.  LUNGS: Bilaterally clear to auscultation, no rhonchi. ABDOMEN: Soft, nontender.    EXTREMITIES: No pedal edema.   LAB RESULTS: 01/21/15 - serum iron 23, and saturation 90%, ferritin 91, TIBC 246, hemoglobin 7.7, MCV 76.3.  Lab Results  Component Value Date   WBC 6.0 04/20/2015   NEUTROABS 3.9 04/20/2015   HGB 9.2* 04/20/2015   HCT 29.1* 04/20/2015   MCV 84.1 04/20/2015   PLT 282 04/20/2015   10/26/2014 - hemoglobin 8.6, WBC 8200, 76% neutrophils, platelets 370, serum iron low at 16, iron saturation low at 6%, ferritin 84.  09/29/14 - TURBT  Surgical Pathology Report.   DIAGNOSIS:   A. URINARY BLADDER, MASS; TURBT:  UROTHELIAL CARCINOMA, INVASIVE INTO LAMINA PROPRIA, HIGH GRADE, AND INVERTED.  FOCI OF FLAT CARCINOMA IN SITU ARE NOTED.  NO MUSCULARIS PROPRIA PRESENT.   B. URINARY BLADDER, TUMOR BASE; BIOPSY: NO MALIGNANCY SEEN.    STUDIES: 08/04/14 - CT scan of chest/abdomen/pelvis. 1.  No acute process in the chest. 2.  Atherosclerosis, including within the coronary arteries. 3. Tiny nonspecific posterior right upper lobe pulmonary nodule which  can be re-evaluated on follow-up. 4. Right thyroid nodule which warrants further evaluation with thyroid ultrasound. 1. Right bladder base nodule, highly suspicious for transitional cell carcinoma. These results will be called to the ordering clinician or representative by the Radiologist Assistant, and communication documented in the PACS or zVision Dashboard. 2. No evidence of metastatic disease within the abdomen or pelvis. 3. Postsurgical changes within the lumbar spine. Increased (since 05/18/2014) in nonspecific multiloculated postoperative fluid collection/collections.  08/05/14 - US Thyroid - IMPRESSION: Bilateral nodules. The dominant nodule in the lower pole of the right lobe measures 2.1 cm. Findings meet consensus criteria for biopsy. Ultrasound-guided fine needle aspiration should be considered.  ASSESSMENT / PLAN:   1.  Symptomatic anemia March 2016, likely due to erosive gastritis causing iron deficiency. Received IV Venofer 500 mg on 08/04/14 in-hospital, then started on oral iron therapy. Although  hemoglobin has improved with aggressive iron repletion, hemoglobin still remains below normal, which raises concerns about other causes of anemia and an 81 year old lady. Her total protein is normal, and albumin is only marginally low, so I doubt that she has an advanced form of plasma cell dyscrasia. 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 We will monitor her hematologic parameters once a month and see her back in 2 months in our clinic to discuss developments, but at this point we will not pursue any aggressive diagnostic measures, since she appears to be clinically and functionally stable, however, if anemia worsens over the next 2 months, we will consider doing bone marrow biopsy.    2.   Unintentional weight loss with workup March 2016 showing thyroid nodule and possible urinary bladder mass - Patient following with urology, had TURBT which showed superficial bladder cancer, she is on a course of intravesical chemotherapy. She is followed by urology and have cystoscopy in December. She will have an upper endoscopy within the next few weeks. She continues to be followed by ENT specialist for thyroid nodules.   3.  In between visits, patient advised to call or come to ER in case of any fevers, progressive symptoms or acute sickness. She is agreeable to this plan.    Roxana Hires, MD   04/20/2015 10:41 AM

## 2015-04-20 NOTE — Progress Notes (Signed)
Pt states she feels better since she got venofer in august and Sept.  Her fatigue is better but when she gets tired from her chores she sits and rests.  She did see dr Tami Ribas for thyroid and they are monitoring and she was told maybe she may need stretching of the esophagus and she has EGD Dec with [place].  She also finished the bcg treatments with urology in Sept and having cystoscopy 12/2.  She has no blood in her stool or urine.

## 2015-04-29 ENCOUNTER — Ambulatory Visit (INDEPENDENT_AMBULATORY_CARE_PROVIDER_SITE_OTHER): Payer: Medicare Other | Admitting: Urology

## 2015-04-29 ENCOUNTER — Encounter: Payer: Self-pay | Admitting: Urology

## 2015-04-29 VITALS — BP 147/82 | HR 60 | Ht 61.0 in | Wt 142.9 lb

## 2015-04-29 DIAGNOSIS — C675 Malignant neoplasm of bladder neck: Secondary | ICD-10-CM | POA: Diagnosis not present

## 2015-04-29 LAB — URINALYSIS, COMPLETE
Bilirubin, UA: NEGATIVE
Glucose, UA: NEGATIVE
Ketones, UA: NEGATIVE
Nitrite, UA: NEGATIVE
Specific Gravity, UA: 1.015 (ref 1.005–1.030)
Urobilinogen, Ur: 1 mg/dL (ref 0.2–1.0)
pH, UA: 6 (ref 5.0–7.5)

## 2015-04-29 LAB — MICROSCOPIC EXAMINATION: RENAL EPITHEL UA: NONE SEEN /HPF

## 2015-04-29 MED ORDER — LIDOCAINE HCL 2 % EX GEL
1.0000 "application " | Freq: Once | CUTANEOUS | Status: AC
  Administered 2015-04-29: 1 via URETHRAL

## 2015-04-29 MED ORDER — CIPROFLOXACIN HCL 500 MG PO TABS
500.0000 mg | ORAL_TABLET | Freq: Once | ORAL | Status: AC
  Administered 2015-04-29: 500 mg via ORAL

## 2015-04-29 NOTE — Progress Notes (Addendum)
3:12 PM   [name] 1932/11/28 [id number]  Referring provider: Kirk Ruths, MD [place], [place] [address]  Chief Complaint  Patient presents with  . Cysto    HPI: 81 year old nonsmoker with  incidental 11 mm papillary neoplasm near the RIGHT bladder neck. She was taken to the operating room on 09/29/2014 for TURBT, Bilateral retrograde pyelogram at which time a 2 cm extremely spherical mass was identified just proximal to the RIGHT UO. This was resected along with deeper biopsies of the base. Of note, bilateral retrograde pyelogram was negative for any obvious upper tract filling defects or hydronephrosis.  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 The tumor had an inverted architecture. Deeper biopsies did contain muscle after speaking with the pathologist today and the muscle was negative for any tumor. Additonally, CIS was noted.  She returned to the operating room on 11/02/2014 for restaging TURBT. Pathology showed no evidence of residual tumor.  She completed BCG x 6 for induction 3 months ago which was well tolerated.    She returns today for surveillance cystoscopy.    PMH: Past Medical History  Diagnosis Date  . Hypertension   . Anemia   . DDD (degenerative disc disease), cervical   . DDD (degenerative disc disease), lumbar   . Elevated lipids   . Osteoporosis   . Glaucoma   . Multiple gastric ulcers   . Foot pain   . Colon polyp   . Chronic low back pain   . Cancer [facility]) 2016    bladder tumor  . Carotid arterial disease ([place]) 01/09/2014    Overview:  Minimal on screening     Surgical History: Past Surgical History  Procedure Laterality Date  . Back surgery      x 6  . Carpal tunnel release Right   . Wrist fracture surgery Left   . Abdominal hysterectomy    . Knee arthroscopy Right     x2  . Mandible surgery Bilateral     x2  .  Cataract extraction Bilateral   . Transurethral resection of bladder tumor N/A 09/29/2014    Procedure: TRANSURETHRAL RESECTION OF BLADDER TUMOR (TURBT);  Surgeon: Hollice Espy, MD;  Location: [facility] ORS;  Service: Urology;  Laterality: N/A;  . Cystoscopy w/ retrogrades Bilateral 09/29/2014    Procedure: CYSTOSCOPY WITH RETROGRADE PYELOGRAM;  Surgeon: Hollice Espy, MD;  Location: [facility] ORS;  Service: Urology;  Laterality: Bilateral;  . Eye surgery    . Cystoscopy with biopsy N/A 11/02/2014    Procedure: CYSTOSCOPY WITH BIOPSY/WITH MITOMYCIN;  Surgeon: Hollice Espy, MD;  Location: [facility] ORS;  Service: Urology;  Laterality: N/A;  . Foot surgery Right     Home Medications:    Medication List       This list is accurate as of: 04/29/15  3:12 PM.  Always use your most recent med list.               acetaminophen 650 MG CR tablet  Commonly known as:  TYLENOL  Take 650 mg by mouth every 8 (eight) hours as needed for pain.     Calcium Carb-Ergocalciferol 250-125 MG-UNIT Tabs  Take 1 tablet by mouth.     calcium-vitamin D 500-200 MG-UNIT tablet  Commonly known as:  OSCAL WITH D  Take 2 tablets by mouth 2 (two) times daily.     carvedilol 25 MG tablet  Commonly known as:  COREG  Take 25 mg by mouth 2 (two) times daily with a meal.     diltiazem 240 MG 24 hr capsule  Commonly known as:  DILACOR XR  Take 240 mg by mouth.     diltiazem 240 MG 24 hr capsule  Commonly known as:  CARDIZEM CD  Take 240 mg by mouth daily.     ferrous sulfate 325 (65 FE) MG tablet  Take 325 mg by mouth daily with breakfast.     latanoprost 0.005 % ophthalmic solution  Commonly known as:  XALATAN  Place 1 drop into both eyes at bedtime.     pantoprazole 40 MG tablet  Commonly known as:  PROTONIX     pravastatin 40 MG tablet  Commonly known as:  PRAVACHOL  Take 40 mg by mouth daily at 6 PM.     raloxifene 60 MG tablet  Commonly known as:  EVISTA  Take 60 mg by mouth daily.     torsemide 20 MG  tablet  Commonly known as:  DEMADEX  Take 20 mg by mouth daily as needed.     vitamin C 1000 MG tablet  Take 1,000 mg by mouth daily.        Allergies:  Allergies  Allergen Reactions  . Keflex [Cephalexin] Nausea Only    Family History: Family History  Problem Relation Age of Onset  . Heart attack Father   . Emphysema Father   . Diabetes type II Sister   . Heart disease Mother   . Hypertension Mother   . Kidney disease Neg Hx   . Bladder Cancer Neg Hx   . Breast cancer Neg Hx     Social History:  reports that she has never smoked. She does not have any smokeless tobacco history on file. She reports that she does not drink alcohol or use illicit drugs.   Physical Exam: BP 147/82 mmHg  Pulse 60  Ht 5\' 1"  (1.549 m)  Wt 142 lb 14.4 oz (64.819 kg)  BMI 27.01 kg/m2  Constitutional:  Alert and oriented, No acute distress. HEENT: [place] AT, moist mucus membranes.  Trachea midline, no masses. Cardiovascular: No clubbing, cyanosis, or edema. Respiratory: Normal respiratory effort, no increased work of breathing. GI: Abdomen is soft, nontender, nondistended, no abdominal masses Skin: No rashes, bruises or suspicious lesions. Neurologic: Grossly intact, no focal deficits, moving all 4 extremities. Psychiatric: Normal mood and affect.  Laboratory Data: Lab Results  Component Value Date   WBC 6.0 04/20/2015   HGB 9.2* 04/20/2015   HCT 29.1* 04/20/2015   MCV 84.1 04/20/2015   PLT 282 04/20/2015    Urinalysis Results for orders placed or performed in visit on 04/29/15  Microscopic Examination  Result Value Ref Range   WBC, UA 6-10 (A) 0 -  5 /hpf   RBC, UA 3-10 (A) 0 -  2 /hpf   Epithelial Cells (non renal) 0-10 0 - 10 /hpf   Renal Epithel, UA None seen None seen /hpf   Bacteria, UA Few (A) None seen/Few  Urinalysis, Complete  Result Value Ref Range   Specific Gravity, UA 1.015 1.005 - 1.030   pH, UA 6.0 5.0 - 7.5   Color, UA Yellow Yellow   Appearance Ur Clear  Clear   Leukocytes, UA 1+ (A) Negative   Protein, UA Trace (A) Negative/Trace   Glucose, UA Negative Negative   Ketones, UA Negative Negative   RBC, UA 2+ (A) Negative   Bilirubin, UA Negative Negative  Urobilinogen, Ur 1.0 0.2 - 1.0 mg/dL   Nitrite, UA Negative Negative   Microscopic Examination See below:      Pertinent Imaging: CT abd/ pelvis 08/04/14 CT ABDOMEN AND PELVIS IMPRESSION 1. Right bladder base nodule, highly suspicious for transitional cell carcinoma. These results will be called to the ordering clinician or representative by the Radiologist Assistant, and communication documented in the PACS or zVision Dashboard. 2. No evidence of metastatic disease within the abdomen or pelvis. 3. Postsurgical changes within the lumbar spine. Increased (since 05/18/2014) in nonspecific multiloculated postoperative fluid    Cystoscopy Procedure Note  Patient identification was confirmed, informed consent was obtained, and patient was prepped using Betadine solution.  Lidocaine jelly was administered per urethral meatus.    Preoperative abx where received prior to procedure.    Procedure: - Flexible cystoscope introduced, without any difficulty.   - Thorough search of the bladder revealed:    normal urethral meatus    normal urothelium    no stones    no ulcers     no tumors    no urethral polyps    no trabeculation  - Ureteral orifices were normal in position and appearance.  Post-Procedure: - Patient tolerated the procedure well  Assessment & Plan:  81 year old female with a history of high-grade T1, Tis of the right bladder neck dx 09/2014 followed by induction BCG.  She returns today for surveillance cystoscopy.   1. Malignant neoplasm of bladder neck Cystoscopy today negative.  Recommend q3 month surveillance cystoscopy. Urine cytology sent today as well. Recommend BCG maintenance starting next week x 3 doses - Urinalysis, Complete - CULTURE, URINE  COMPREHENSIVE   Return in about 3 months (around 07/28/2015) for cystoscopy (also BCG appt x 3 starting next week(.  Hollice Espy, MD  [facility] [address], [place], [place] [address] [phone]  Addendum: Urine cytology negative

## 2015-05-02 ENCOUNTER — Telehealth: Payer: Self-pay | Admitting: Obstetrics and Gynecology

## 2015-05-02 NOTE — Telephone Encounter (Signed)
LMOM for patient to call back in regards to last urine specimen dropped off.

## 2015-05-02 NOTE — Telephone Encounter (Signed)
We were notified that this patient's urine specimen sent for cytology leaked in transit and that they did not have enough to perform the entire cytology.  They said that the patient will not be charged and that we can send in another specimen.  Will you please notify the patient and have her come in and provide another specimen to be sent in. Please apologize for the inconvenience. Thanks

## 2015-05-03 ENCOUNTER — Encounter: Payer: Self-pay | Admitting: Urology

## 2015-05-03 NOTE — Telephone Encounter (Signed)
Spoke with patient and gave her message about the urine leaked in transfit for her cytology, patient to back back and give another specimen no charge in a few days. Patient ok with plan.

## 2015-05-06 ENCOUNTER — Ambulatory Visit (INDEPENDENT_AMBULATORY_CARE_PROVIDER_SITE_OTHER): Payer: Medicare Other | Admitting: Obstetrics and Gynecology

## 2015-05-06 ENCOUNTER — Encounter: Payer: Self-pay | Admitting: Obstetrics and Gynecology

## 2015-05-06 VITALS — BP 156/56 | HR 64 | Resp 16 | Ht 61.0 in | Wt 133.8 lb

## 2015-05-06 DIAGNOSIS — C675 Malignant neoplasm of bladder neck: Secondary | ICD-10-CM

## 2015-05-06 LAB — URINALYSIS, COMPLETE
Bilirubin, UA: NEGATIVE
GLUCOSE, UA: NEGATIVE
KETONES UA: NEGATIVE
NITRITE UA: NEGATIVE
Specific Gravity, UA: 1.02 (ref 1.005–1.030)
UUROB: 1 mg/dL (ref 0.2–1.0)
pH, UA: 6 (ref 5.0–7.5)

## 2015-05-06 LAB — MICROSCOPIC EXAMINATION

## 2015-05-06 MED ORDER — BCG LIVE 50 MG IS SUSR
0.2000 mL | Freq: Once | INTRAVESICAL | Status: AC
  Administered 2015-05-06: 5 mg via INTRAVESICAL

## 2015-05-06 NOTE — Progress Notes (Signed)
BCG Bladder Instillation  BCG # 2  Due to Bladder Cancer patient is present today for a BCG treatment. Patient was cleaned and prepped in a sterile fashion with betadine and lidocaine 2% jelly was instilled into the urethra.  A 14FR catheter was inserted, urine return was noted 67ml, urine was yellow in color.  60ml of reconstituted BCG was instilled into the bladder. The catheter was then removed. Patient tolerated well, no complications were noted  Preformed by: Herbert Moors, FNP

## 2015-05-06 NOTE — Progress Notes (Signed)
05/06/2015 12:32 PM   [name] 02/25/1933 [id number]  Referring provider: Kirk Ruths, MD [facility] [place], [place] [address]  Chief Complaint  Patient presents with  . Bladder Cancer  . Procedure    BCG instillation    HPI: 81 year old nonsmoker with incidental 11 mm papillary neoplasm near the RIGHT bladder neck. She was taken to the operating room on 09/29/2014 for TURBT, Bilateral retrograde pyelogram at which time a 2 cm extremely spherical mass was identified just proximal to the RIGHT UO. This was resected along with deeper biopsies of the base. Of note, bilateral retrograde pyelogram was negative for any obvious upper tract filling defects or hydronephrosis.  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 The tumor had an inverted architecture. Deeper biopsies did contain muscle after speaking with the pathologist today and the muscle was negative for any tumor. Additonally, CIS was noted.  She returned to the operating room on 11/02/2014 for restaging TURBT. Pathology showed no evidence of residual tumor.  She completed BCG x 6 for induction 3 months ago which was well tolerated.   Last surveillance cystoscopy performed on 04/29/15 by Dr. Erlene Quan negative. Recommendations made at that visit to begin BCG maintenance therapy once a week 3 doses. Patient presents today for 1 BCG maintenance instillations.     PMH: Past Medical History  Diagnosis Date  . Hypertension   . Anemia   . DDD (degenerative disc disease), cervical   . DDD (degenerative disc disease), lumbar   . Elevated lipids   . Osteoporosis   . Glaucoma   . Multiple gastric ulcers   . Foot pain   . Colon polyp   . Chronic low back pain   . Cancer [facility]) 2016    bladder tumor  . Carotid arterial disease ([place]) 01/09/2014    Overview:  Minimal on screening     Surgical History: Past Surgical History   Procedure Laterality Date  . Back surgery      x 6  . Carpal tunnel release Right   . Wrist fracture surgery Left   . Abdominal hysterectomy    . Knee arthroscopy Right     x2  . Mandible surgery Bilateral     x2  . Cataract extraction Bilateral   . Transurethral resection of bladder tumor N/A 09/29/2014    Procedure: TRANSURETHRAL RESECTION OF BLADDER TUMOR (TURBT);  Surgeon: Hollice Espy, MD;  Location: [facility] ORS;  Service: Urology;  Laterality: N/A;  . Cystoscopy w/ retrogrades Bilateral 09/29/2014    Procedure: CYSTOSCOPY WITH RETROGRADE PYELOGRAM;  Surgeon: Hollice Espy, MD;  Location: [facility] ORS;  Service: Urology;  Laterality: Bilateral;  . Eye surgery    . Cystoscopy with biopsy N/A 11/02/2014    Procedure: CYSTOSCOPY WITH BIOPSY/WITH MITOMYCIN;  Surgeon: Hollice Espy, MD;  Location: [facility] ORS;  Service: Urology;  Laterality: N/A;  . Foot surgery Right     Home Medications:    Medication List       This list is accurate as of: 05/06/15 12:32 PM.  Always use your most recent med list.               acetaminophen 650 MG CR tablet  Commonly known as:  TYLENOL  Take 650 mg by mouth every 8 (eight) hours as needed for pain.     Calcium Carb-Ergocalciferol 250-125 MG-UNIT Tabs  Take 1 tablet by mouth.     calcium-vitamin D 500-200  MG-UNIT tablet  Commonly known as:  OSCAL WITH D  Take 2 tablets by mouth 2 (two) times daily.     carvedilol 25 MG tablet  Commonly known as:  COREG  Take 25 mg by mouth 2 (two) times daily with a meal.     diltiazem 240 MG 24 hr capsule  Commonly known as:  CARDIZEM CD  Take 240 mg by mouth daily.     ferrous sulfate 325 (65 FE) MG tablet  Take 325 mg by mouth daily with breakfast.     latanoprost 0.005 % ophthalmic solution  Commonly known as:  XALATAN  Place 1 drop into both eyes at bedtime.     pantoprazole 40 MG tablet  Commonly known as:  PROTONIX     pravastatin 40 MG tablet  Commonly known as:  PRAVACHOL  Take 40 mg by  mouth daily at 6 PM.     raloxifene 60 MG tablet  Commonly known as:  EVISTA  Take 60 mg by mouth daily.     vitamin C 1000 MG tablet  Take 1,000 mg by mouth daily.        Allergies:  Allergies  Allergen Reactions  . Keflex [Cephalexin] Nausea Only    Family History: Family History  Problem Relation Age of Onset  . Heart attack Father   . Emphysema Father   . Diabetes type II Sister   . Heart disease Mother   . Hypertension Mother   . Kidney disease Neg Hx   . Bladder Cancer Neg Hx   . Breast cancer Neg Hx     Social History:  reports that she has never smoked. She does not have any smokeless tobacco history on file. She reports that she does not drink alcohol or use illicit drugs.  ROS: UROLOGY Frequent Urination?: No Hard to postpone urination?: No Burning/pain with urination?: No Get up at night to urinate?: No Leakage of urine?: No Urine stream starts and stops?: No Trouble starting stream?: No Do you have to strain to urinate?: No Blood in urine?: No Urinary tract infection?: No Sexually transmitted disease?: No Injury to kidneys or bladder?: No Painful intercourse?: No Weak stream?: No Currently pregnant?: No Vaginal bleeding?: No Last menstrual period?: n  Gastrointestinal Nausea?: No Vomiting?: No Indigestion/heartburn?: No Diarrhea?: No Constipation?: No  Constitutional Fever: No Night sweats?: No Weight loss?: No Fatigue?: No  Skin Skin rash/lesions?: No Itching?: No  Eyes Blurred vision?: No Double vision?: No  Ears/Nose/Throat Sore throat?: No Sinus problems?: No  Hematologic/Lymphatic Swollen glands?: No Easy bruising?: No  Cardiovascular Leg swelling?: No Chest pain?: No  Respiratory Cough?: No Shortness of breath?: No  Endocrine Excessive thirst?: No  Musculoskeletal Back pain?: Yes Joint pain?: No  Neurological Headaches?: No Dizziness?: No  Psychologic Depression?: No Anxiety?: No  Physical  Exam: BP 156/56 mmHg  Pulse 64  Resp 16  Ht 5\' 1"  (1.549 m)  Wt 133 lb 12.8 oz (60.691 kg)  BMI 25.29 kg/m2  Constitutional:  Alert and oriented, No acute distress. HEENT: [place] AT, moist mucus membranes.  Trachea midline, no masses. Cardiovascular: No clubbing, cyanosis, or edema. Respiratory: Normal respiratory effort, no increased work of breathing. GU: normal external vaginal exam, normal urinary meatus Skin: No rashes, bruises or suspicious lesions. Neurologic: Grossly intact, no focal deficits, moving all 4 extremities. Psychiatric: Normal mood and affect.  Laboratory Data:   Urinalysis  Pertinent Imaging:   Assessment & Plan:    1. Malignant neoplasm of bladder neck (  [place])- UA unremarkable today. 1 of 3 maintenance BCG installations completed today.  Patient tolerated procedure well.  Cystoscopy as scheduled.  - Urinalysis, Complete   Return in about 1 week (around 05/13/2015) for BCG 2 of 3.  These notes generated with voice recognition software. I apologize for typographical errors.  Herbert Moors, [facility] [address], [place], [place] [address] [phone]

## 2015-05-10 ENCOUNTER — Ambulatory Visit: Payer: Medicare Other | Admitting: Anesthesiology

## 2015-05-10 ENCOUNTER — Encounter: Payer: Self-pay | Admitting: *Deleted

## 2015-05-10 ENCOUNTER — Encounter
Admission: RE | Disposition: A | Discharge status: Home / Self Care | Payer: Self-pay | Source: Ambulatory Visit | Attending: Gastroenterology

## 2015-05-10 ENCOUNTER — Ambulatory Visit
Admission: RE | Admit: 2015-05-10 | Discharge: 2015-05-10 | Disposition: A | Discharge status: Home / Self Care | Payer: Medicare Other | Source: Ambulatory Visit | Attending: Gastroenterology | Admitting: Gastroenterology

## 2015-05-10 ENCOUNTER — Encounter: Payer: Self-pay | Admitting: Obstetrics and Gynecology

## 2015-05-10 DIAGNOSIS — K2930 Chronic superficial gastritis without bleeding: Secondary | ICD-10-CM | POA: Insufficient documentation

## 2015-05-10 DIAGNOSIS — I10 Essential (primary) hypertension: Secondary | ICD-10-CM | POA: Insufficient documentation

## 2015-05-10 DIAGNOSIS — D509 Iron deficiency anemia, unspecified: Secondary | ICD-10-CM | POA: Diagnosis present

## 2015-05-10 DIAGNOSIS — K449 Diaphragmatic hernia without obstruction or gangrene: Secondary | ICD-10-CM | POA: Diagnosis not present

## 2015-05-10 DIAGNOSIS — G8929 Other chronic pain: Secondary | ICD-10-CM | POA: Insufficient documentation

## 2015-05-10 DIAGNOSIS — M5030 Other cervical disc degeneration, unspecified cervical region: Secondary | ICD-10-CM | POA: Insufficient documentation

## 2015-05-10 DIAGNOSIS — M810 Age-related osteoporosis without current pathological fracture: Secondary | ICD-10-CM | POA: Diagnosis not present

## 2015-05-10 DIAGNOSIS — Z8711 Personal history of peptic ulcer disease: Secondary | ICD-10-CM | POA: Diagnosis not present

## 2015-05-10 DIAGNOSIS — M5136 Other intervertebral disc degeneration, lumbar region: Secondary | ICD-10-CM | POA: Insufficient documentation

## 2015-05-10 DIAGNOSIS — M545 Low back pain: Secondary | ICD-10-CM | POA: Insufficient documentation

## 2015-05-10 DIAGNOSIS — Z881 Allergy status to other antibiotic agents status: Secondary | ICD-10-CM | POA: Insufficient documentation

## 2015-05-10 DIAGNOSIS — Z86010 Personal history of colonic polyps: Secondary | ICD-10-CM | POA: Insufficient documentation

## 2015-05-10 DIAGNOSIS — R1310 Dysphagia, unspecified: Secondary | ICD-10-CM | POA: Insufficient documentation

## 2015-05-10 DIAGNOSIS — H409 Unspecified glaucoma: Secondary | ICD-10-CM | POA: Insufficient documentation

## 2015-05-10 DIAGNOSIS — Z8551 Personal history of malignant neoplasm of bladder: Secondary | ICD-10-CM | POA: Insufficient documentation

## 2015-05-10 DIAGNOSIS — Z79899 Other long term (current) drug therapy: Secondary | ICD-10-CM | POA: Insufficient documentation

## 2015-05-10 HISTORY — PX: ESOPHAGOGASTRODUODENOSCOPY (EGD) WITH PROPOFOL: SHX5813

## 2015-05-10 SURGERY — ESOPHAGOGASTRODUODENOSCOPY (EGD) WITH PROPOFOL
Anesthesia: General

## 2015-05-10 MED ORDER — SODIUM CHLORIDE 0.9 % IV SOLN
INTRAVENOUS | Status: DC
Start: 2015-05-10 — End: 2015-05-10
  Administered 2015-05-10: 1000 mL via INTRAVENOUS

## 2015-05-10 MED ORDER — PROPOFOL 500 MG/50ML IV EMUL
INTRAVENOUS | Status: DC | PRN
  Administered 2015-05-10: 150 ug/kg/min via INTRAVENOUS

## 2015-05-10 MED ORDER — SODIUM CHLORIDE 0.9 % IV SOLN
INTRAVENOUS | Status: DC
Start: 2015-05-10 — End: 2015-05-10

## 2015-05-10 MED ORDER — PROPOFOL 10 MG/ML IV BOLUS
INTRAVENOUS | Status: DC | PRN
  Administered 2015-05-10: 20 mg via INTRAVENOUS
  Administered 2015-05-10 (×2): 10 mg via INTRAVENOUS
  Administered 2015-05-10: 20 mg via INTRAVENOUS

## 2015-05-10 MED ORDER — FENTANYL CITRATE (PF) 100 MCG/2ML IJ SOLN
INTRAMUSCULAR | Status: DC | PRN
  Administered 2015-05-10 (×2): 25 ug via INTRAVENOUS

## 2015-05-10 MED ORDER — LIDOCAINE HCL (PF) 2 % IJ SOLN
INTRAMUSCULAR | Status: DC | PRN
  Administered 2015-05-10: 60 mg via INTRADERMAL

## 2015-05-10 NOTE — Anesthesia Preprocedure Evaluation (Addendum)
Anesthesia Evaluation  Patient identified by MRN, date of birth, ID band Patient awake    Reviewed: Allergy & Precautions, NPO status , Patient's Chart, lab work & pertinent test results, reviewed documented beta blocker date and time   Airway Mallampati: II  TM Distance: >3 FB     Dental  (+) Chipped   Pulmonary           Cardiovascular hypertension, Pt. on medications and Pt. on home beta blockers      Neuro/Psych    GI/Hepatic   Endo/Other    Renal/GU      Musculoskeletal  (+) Arthritis ,   Abdominal   Peds  Hematology  (+) anemia ,   Anesthesia Other Findings Bladder Ca - being treated.  Reproductive/Obstetrics                            Anesthesia Physical Anesthesia Plan  ASA: III  Anesthesia Plan: General   Post-op Pain Management:    Induction: Intravenous  Airway Management Planned:   Additional Equipment:   Intra-op Plan:   Post-operative Plan:   Informed Consent: I have reviewed the patients History and Physical, chart, labs and discussed the procedure including the risks, benefits and alternatives for the proposed anesthesia with the patient or authorized representative who has indicated his/her understanding and acceptance.     Plan Discussed with: CRNA  Anesthesia Plan Comments:         Anesthesia Quick Evaluation

## 2015-05-10 NOTE — H&P (Signed)
Outpatient short stay form Pre-procedure 05/10/2015 11:16 AM Elizabeth Sails MD  Primary Physician: [name], Dr. Anda Latina  Reason for visit:  EGD and possible esophageal dilatation  History of present illness:  Patient is a 81 year old Caucasian female presenting today for complaint of dysphagia. She also has a history of iron deficiency anemia with exacerbation and history of erosive gastritis. She has had an ulcer within the past year.  He has been having some amount of dysphagia with pills and particularly liquids. There was a barium swallow was done on 03/17/2015 revealed flash laryngeal penetration without tracheal aspiration. There is small hiatal hernia. A barium tablet went through the esophagus and GE junction without delay.    Current facility-administered medications:  .  0.9 %  sodium chloride infusion, , Intravenous, Continuous, Elizabeth Sails, MD, Last Rate: 20 mL/hr at 05/10/15 1054, 1,000 mL at 05/10/15 1054 .  0.9 %  sodium chloride infusion, , Intravenous, Continuous, Elizabeth Sails, MD  Prescriptions prior to admission  Medication Sig Dispense Refill Last Dose  . acetaminophen (TYLENOL) 650 MG CR tablet Take 650 mg by mouth every 8 (eight) hours as needed for pain.   Past Week at Unknown time  . Ascorbic Acid (VITAMIN C) 1000 MG tablet Take 1,000 mg by mouth daily.   Past Week at Unknown time  . Calcium Carb-Ergocalciferol 250-125 MG-UNIT TABS Take 1 tablet by mouth.   Past Week at Unknown time  . calcium-vitamin D (OSCAL WITH D) 500-200 MG-UNIT per tablet Take 2 tablets by mouth 2 (two) times daily.    Past Week at Unknown time  . carvedilol (COREG) 25 MG tablet Take 25 mg by mouth 2 (two) times daily with a meal.   05/10/2015 at Unknown time  . diltiazem (CARDIZEM CD) 240 MG 24 hr capsule Take 240 mg by mouth daily.   05/10/2015 at Unknown time  . ferrous sulfate 325 (65 FE) MG tablet Take 325 mg by mouth daily with breakfast.   Past Week at  Unknown time  . latanoprost (XALATAN) 0.005 % ophthalmic solution Place 1 drop into both eyes at bedtime.   05/09/2015 at Unknown time  . pantoprazole (PROTONIX) 40 MG tablet   0 05/09/2015 at Unknown time  . pravastatin (PRAVACHOL) 40 MG tablet Take 40 mg by mouth daily at 6 PM.   Past Week at Unknown time  . raloxifene (EVISTA) 60 MG tablet Take 60 mg by mouth daily.   Past Week at Unknown time     Allergies  Allergen Reactions  . Keflex [Cephalexin] Nausea Only     Past Medical History  Diagnosis Date  . Hypertension   . Anemia   . DDD (degenerative disc disease), cervical   . DDD (degenerative disc disease), lumbar   . Elevated lipids   . Osteoporosis   . Glaucoma   . Multiple gastric ulcers   . Foot pain   . Colon polyp   . Chronic low back pain   . Cancer [facility]) 2016    bladder tumor  . Carotid arterial disease ([place]) 01/09/2014    Overview:  Minimal on screening     Review of systems:      Physical Exam    Heart and lungs: Regular rate and rhythm without rub or gallop, lungs are bilaterally clear.    HEENT: Norm cephalic atraumatic eyes are anicteric. It is of note she has a little bit of withdrawal on the left side of the face is apparently  associated with a oral surgery she had a number of years ago, similar in appearance to a Bell's palsy.    Other:     Pertinant exam for procedure: Soft nontender nondistended bowel sounds positive normoactive.    Planned proceedures: EGD and indicated procedures. I have discussed the risks benefits and complications of procedures to include not limited to bleeding, infection, perforation and the risk of sedation and the patient wishes to proceed.    Elizabeth Sails, MD Gastroenterology 05/10/2015  11:16 AM

## 2015-05-10 NOTE — Op Note (Signed)
[facility] Patient Name: [name] Procedure Date: 05/10/2015 11:16 AM MRN: [id number] Account #: [id number] Date of Birth: August 23, 1932 Admit Type: Outpatient Age: 81 Room: [facility] ENDO ROOM 3 Gender: Female Note Status: Finalized Procedure:         Upper GI endoscopy Indications:       Iron deficiency anemia, Dysphagia, Follow-up of gastric                     ulcer Providers:         Lollie Sails, MD Referring MD:      Ocie Cornfield. Ouida Sills, MD (Referring MD) Medicines:         Monitored Anesthesia Care Complications:     No immediate complications. Procedure:         Pre-Anesthesia Assessment:                    - ASA Grade Assessment: III - A patient with severe                     systemic disease.                    After obtaining informed consent, the endoscope was passed                     under direct vision. Throughout the procedure, the                     patient's blood pressure, pulse, and oxygen saturations                     were monitored continuously. The Endoscope was introduced                     through the mouth, and advanced to the third part of                     duodenum. The upper GI endoscopy was accomplished without                     difficulty. The patient tolerated the procedure well. Findings:      The Z-line was variable. Biopsies were taken with a cold forceps for       histology.      Scattered and patchy minimal inflammation characterized by congestion       (edema) and erythema was found in the gastric body and in the gastric       antrum. Biopsies were taken with a cold forceps for histology.      The examined duodenum was normal.      A small hiatus hernia was found. The Z-line was a variable distance from       incisors; the hiatal hernia was sliding.      there is no evidence of esophagitis, narrowing or stenosis throughout       the esophagus. A prominant aortic impression is noted.  There is a marked       angulation of the GE junction prior to entering the stomach, but no       evidence of ring or obstructive lesion. Impression:        - Z-line variable. Biopsied.                    - Gastritis. Biopsied.                    -  Normal examined duodenum.                    - Small hiatus hernia. Recommendation:    - Discharge patient to home.                    - Perform a modified barium swallow at appointment to be                     scheduled.                    - Return to GI clinic in 1 month. Procedure Code(s): --- Professional ---                    [phone], Esophagogastroduodenoscopy, flexible, transoral;                     with biopsy, single or multiple Diagnosis Code(s): --- Professional ---                    K22.8, Other specified diseases of esophagus                    K29.70, Gastritis, unspecified, without bleeding                    K44.9, Diaphragmatic hernia without obstruction or gangrene                    D50.9, Iron deficiency anemia, unspecified                    R13.10, Dysphagia, unspecified                    K25.9, Gastric ulcer, unspecified as acute or chronic,                     without hemorrhage or perforation CPT copyright 2014 American Medical Association. All rights reserved. The codes documented in this report are preliminary and upon coder review may  be revised to meet current compliance requirements. Lollie Sails, MD 05/10/2015 11:47:52 AM This report has been signed electronically. Number of Addenda: 0 Note Initiated On: 05/10/2015 11:16 AM      [facility]

## 2015-05-10 NOTE — Anesthesia Postprocedure Evaluation (Signed)
Anesthesia Post Note  Patient: TORIONA DEPIES  Procedure(s) Performed: Procedure(s) (LRB): ESOPHAGOGASTRODUODENOSCOPY (EGD) WITH PROPOFOL (N/A)  Patient location during evaluation: Endoscopy Anesthesia Type: General Level of consciousness: awake Pain management: pain level controlled Vital Signs Assessment: post-procedure vital signs reviewed and stable Respiratory status: spontaneous breathing Cardiovascular status: blood pressure returned to baseline Anesthetic complications: no    Last Vitals:  Filed Vitals:   05/10/15 1203 05/10/15 1213  BP: 105/42 121/48  Pulse: 56 61  Temp:    Resp: 18 19    Last Pain:  Filed Vitals:   05/10/15 1223  PainSc: 0-No pain                 Haji Delaine S

## 2015-05-10 NOTE — Transfer of Care (Signed)
Immediate Anesthesia Transfer of Care Note  Patient: [name]  Procedure(s) Performed: Procedure(s): ESOPHAGOGASTRODUODENOSCOPY (EGD) WITH PROPOFOL (N/A)  Patient Location: PACU  Anesthesia Type:General  Level of Consciousness: sedated  Airway & Oxygen Therapy: Patient Spontanous Breathing and Patient connected to nasal cannula oxygen  Post-op Assessment: Report given to RN and Post -op Vital signs reviewed and stable  Post vital signs: Reviewed and stable  Last Vitals:  Filed Vitals:   05/10/15 1143 05/10/15 1144  BP: 83/37   Pulse: 58 57  Temp: 36.3 C   Resp: 18 17    Complications: No apparent anesthesia complications

## 2015-05-11 ENCOUNTER — Encounter: Payer: Self-pay | Admitting: Gastroenterology

## 2015-05-11 LAB — SURGICAL PATHOLOGY

## 2015-05-13 ENCOUNTER — Ambulatory Visit (INDEPENDENT_AMBULATORY_CARE_PROVIDER_SITE_OTHER): Payer: Medicare Other | Admitting: Obstetrics and Gynecology

## 2015-05-13 ENCOUNTER — Encounter: Payer: Self-pay | Admitting: Obstetrics and Gynecology

## 2015-05-13 VITALS — BP 134/58 | HR 61 | Resp 16 | Ht 61.0 in | Wt 143.5 lb

## 2015-05-13 DIAGNOSIS — C675 Malignant neoplasm of bladder neck: Secondary | ICD-10-CM

## 2015-05-13 LAB — URINALYSIS, COMPLETE
BILIRUBIN UA: NEGATIVE
Glucose, UA: NEGATIVE
KETONES UA: NEGATIVE
Nitrite, UA: NEGATIVE
SPEC GRAV UA: 1.015 (ref 1.005–1.030)
Urobilinogen, Ur: 1 mg/dL (ref 0.2–1.0)
pH, UA: 5.5 (ref 5.0–7.5)

## 2015-05-13 LAB — MICROSCOPIC EXAMINATION

## 2015-05-13 MED ORDER — BCG LIVE 50 MG IS SUSR
0.2000 mL | Freq: Once | INTRAVESICAL | Status: AC
  Administered 2015-05-13: 5 mg via INTRAVESICAL

## 2015-05-13 NOTE — Progress Notes (Signed)
BCG Bladder Instillation  BCG # 2 (maintenance)  Due to Bladder Cancer patient is present today for a BCG treatment. Patient was cleaned and prepped in a sterile fashion with betadine and lidocaine 2% jelly was instilled into the urethra.  A 14FR catheter was inserted, urine return was noted 61ml, urine was yellow in color.  8ml of reconstituted BCG was instilled into the bladder. The catheter was then removed. Patient tolerated well, no complications were noted  Preformed by: Herbert Moors, FNP  Follow up/ Additional notes: Assisted by Golden Hurter, CMA.

## 2015-05-13 NOTE — Progress Notes (Signed)
2:10 PM   [name] 06/27/1932 [id number]  Referring provider: Kirk Ruths, MD [facility] [place], [place] [address]  Chief Complaint  Patient presents with  . Bladder Cancer  . Procedure    Second BCG maintenance instillation    HPI: 81 year old nonsmoker with incidental 11 mm papillary neoplasm near the RIGHT bladder neck. She was taken to the operating room on 09/29/2014 for TURBT, Bilateral retrograde pyelogram at which time a 2 cm extremely spherical mass was identified just proximal to the RIGHT UO. This was resected along with deeper biopsies of the base. Of note, bilateral retrograde pyelogram was negative for any obvious upper tract filling defects or hydronephrosis.  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 The tumor had an inverted architecture. Deeper biopsies did contain muscle after speaking with the pathologist today and the muscle was negative for any tumor. Additonally, CIS was noted.  She returned to the operating room on 11/02/2014 for restaging TURBT. Pathology showed no evidence of residual tumor.  She completed BCG x 6 for induction which was well tolerated.   Last surveillance cystoscopy performed on 04/29/15 by Dr. Erlene Quan negative. Recommendations made at that visit to begin BCG maintenance therapy once a week 3 doses. Patient presents today for 2 BCG maintenance instillations.    Patient reports that she is feeling well today. She denies any urinary symptoms including dysuria or gross hematuria. No fevers or flank pain.  PMH: Past Medical History  Diagnosis Date  . Hypertension   . Anemia   . DDD (degenerative disc disease), cervical   . DDD (degenerative disc disease), lumbar   . Elevated lipids   . Osteoporosis   . Glaucoma   . Multiple gastric ulcers   . Foot pain   . Colon polyp   . Chronic low back pain   . Cancer [facility]) 2016    bladder  tumor  . Carotid arterial disease ([place]) 01/09/2014    Overview:  Minimal on screening     Surgical History: Past Surgical History  Procedure Laterality Date  . Back surgery      x 6  . Carpal tunnel release Right   . Wrist fracture surgery Left   . Abdominal hysterectomy    . Knee arthroscopy Right     x2  . Mandible surgery Bilateral     x2  . Cataract extraction Bilateral   . Transurethral resection of bladder tumor N/A 09/29/2014    Procedure: TRANSURETHRAL RESECTION OF BLADDER TUMOR (TURBT);  Surgeon: Hollice Espy, MD;  Location: [facility] ORS;  Service: Urology;  Laterality: N/A;  . Cystoscopy w/ retrogrades Bilateral 09/29/2014    Procedure: CYSTOSCOPY WITH RETROGRADE PYELOGRAM;  Surgeon: Hollice Espy, MD;  Location: [facility] ORS;  Service: Urology;  Laterality: Bilateral;  . Eye surgery    . Cystoscopy with biopsy N/A 11/02/2014    Procedure: CYSTOSCOPY WITH BIOPSY/WITH MITOMYCIN;  Surgeon: Hollice Espy, MD;  Location: [facility] ORS;  Service: Urology;  Laterality: N/A;  . Foot surgery Right   . Esophagogastroduodenoscopy (egd) with propofol N/A 05/10/2015    Procedure: ESOPHAGOGASTRODUODENOSCOPY (EGD) WITH PROPOFOL;  Surgeon: Lollie Sails, MD;  Location: [facility] ENDOSCOPY;  Service: Endoscopy;  Laterality: N/A;    Home Medications:    Medication List       This list is accurate as of: 05/13/15  2:10 PM.  Always use your most recent med list.  acetaminophen 650 MG CR tablet  Commonly known as:  TYLENOL  Take 650 mg by mouth every 8 (eight) hours as needed for pain.     Calcium Carb-Ergocalciferol 250-125 MG-UNIT Tabs  Take 1 tablet by mouth.     calcium-vitamin D 500-200 MG-UNIT tablet  Commonly known as:  OSCAL WITH D  Take 2 tablets by mouth 2 (two) times daily.     carvedilol 25 MG tablet  Commonly known as:  COREG  Take 25 mg by mouth 2 (two) times daily with a meal.     diltiazem 240 MG 24 hr capsule  Commonly known as:  CARDIZEM CD  Take 240 mg  by mouth daily.     ferrous sulfate 325 (65 FE) MG tablet  Take 325 mg by mouth daily with breakfast.     latanoprost 0.005 % ophthalmic solution  Commonly known as:  XALATAN  Place 1 drop into both eyes at bedtime.     pantoprazole 40 MG tablet  Commonly known as:  PROTONIX     pravastatin 40 MG tablet  Commonly known as:  PRAVACHOL  Take 40 mg by mouth daily at 6 PM.     raloxifene 60 MG tablet  Commonly known as:  EVISTA  Take 60 mg by mouth daily.     vitamin C 1000 MG tablet  Take 1,000 mg by mouth daily.        Allergies:  Allergies  Allergen Reactions  . Keflex [Cephalexin] Nausea Only    Family History: Family History  Problem Relation Age of Onset  . Heart attack Father   . Emphysema Father   . Diabetes type II Sister   . Heart disease Mother   . Hypertension Mother   . Kidney disease Neg Hx   . Bladder Cancer Neg Hx   . Breast cancer Neg Hx     Social History:  reports that she has never smoked. She has never used smokeless tobacco. She reports that she does not drink alcohol or use illicit drugs.  ROS: UROLOGY Frequent Urination?: No Hard to postpone urination?: No Burning/pain with urination?: No Get up at night to urinate?: No Leakage of urine?: No Urine stream starts and stops?: No Trouble starting stream?: No Do you have to strain to urinate?: No Blood in urine?: No Urinary tract infection?: No Sexually transmitted disease?: No Injury to kidneys or bladder?: No Painful intercourse?: No Weak stream?: No Currently pregnant?: No Vaginal bleeding?: No Last menstrual period?: n  Gastrointestinal Nausea?: No Vomiting?: No Indigestion/heartburn?: No Diarrhea?: No Constipation?: No  Constitutional Fever: No Night sweats?: No Weight loss?: No Fatigue?: No  Skin Skin rash/lesions?: No Itching?: No  Eyes Blurred vision?: No Double vision?: No  Ears/Nose/Throat Sore throat?: No Sinus problems?:  No  Hematologic/Lymphatic Swollen glands?: No Easy bruising?: No  Cardiovascular Leg swelling?: No Chest pain?: No  Respiratory Cough?: No Shortness of breath?: No  Endocrine Excessive thirst?: No  Musculoskeletal Back pain?: No Joint pain?: No  Neurological Headaches?: No Dizziness?: No  Psychologic Depression?: No Anxiety?: No  Physical Exam: BP 134/58 mmHg  Pulse 61  Resp 16  Ht 5\' 1"  (1.549 m)  Wt 143 lb 8 oz (65.091 kg)  BMI 27.13 kg/m2  Constitutional:  Alert and oriented, No acute distress. HEENT:  AT, moist mucus membranes.  Trachea midline, no masses. Cardiovascular: No clubbing, cyanosis, or edema. Respiratory: Normal respiratory effort, no increased work of breathing. GU: normal external vaginal exam, normal urinary meatus Skin: No  rashes, bruises or suspicious lesions. Neurologic: Grossly intact, no focal deficits, moving all 4 extremities. Psychiatric: Normal mood and affect.  Laboratory Data:   Urinalysis Results for orders placed or performed in visit on 05/13/15  Microscopic Examination  Result Value Ref Range   WBC, UA 11-30 (A) 0 -  5 /hpf   RBC, UA 3-10 (A) 0 -  2 /hpf   Epithelial Cells (non renal) 0-10 0 - 10 /hpf   Casts Present (A) None seen /lpf   Cast Type Hyaline casts N/A   Mucus, UA Present (A) Not Estab.   Bacteria, UA Many (A) None seen/Few  Urinalysis, Complete  Result Value Ref Range   Specific Gravity, UA 1.015 1.005 - 1.030   pH, UA 5.5 5.0 - 7.5   Color, UA Yellow Yellow   Appearance Ur Clear Clear   Leukocytes, UA Trace (A) Negative   Protein, UA 2+ (A) Negative/Trace   Glucose, UA Negative Negative   Ketones, UA Negative Negative   RBC, UA 3+ (A) Negative   Bilirubin, UA Negative Negative   Urobilinogen, Ur 1.0 0.2 - 1.0 mg/dL   Nitrite, UA Negative Negative   Microscopic Examination See below:     Pertinent Imaging:   Assessment & Plan:    1. Malignant neoplasm of bladder neck ([place])- UA  unremarkable today. 2 of 3 maintenance BCG installations completed today.  Patient tolerated procedure well.  Cystoscopy as scheduled.  - Urinalysis, Complete   Return in about 1 week (around 05/20/2015) for 3rd BCG.  These notes generated with voice recognition software. I apologize for typographical errors.  Herbert Moors, [facility] [address] [place], [place] [address] [phone]

## 2015-05-15 ENCOUNTER — Telehealth: Payer: Self-pay | Admitting: *Deleted

## 2015-05-15 ENCOUNTER — Encounter: Payer: Self-pay | Admitting: *Deleted

## 2015-05-15 NOTE — Telephone Encounter (Signed)
Dr. Tonette Bihari staff wanted our office to be notified.  Patient is to have shoulder surgery and they were sent a clearance approval and the doctor can not approve with her hgb being so low.  I asked who is doing surgery and they told me dr. Jefm Bryant.  I told staff that i would call myself and speak to ortho office and see what I can do.  i called and spoke to Freda Munro and told her that the 9.3 hgb on 11/23 was the highest she has been in 7 months.  She spoke to Dr. Jefm Bryant and the anesthesia MD and they were both in agreement that is she her hgb is stable ( her norm) at that level they would be ok with sch surgery because the shoulder surgery is minimal blood loss.  I spoke to Dr. Rudean Hitt and he states he does not if she will keep that hgb level of 9.3.  His thought was to have pt get labs 3 days before surgery and if the hgb is not a good number that the ortho and anesthesia is not comfortable with then we can see what treatment we can do so that her hgb is good number that the surgeons are comfortable with.  I spoke to [place] at dr. Jefm Bryant office and she just asked me to type of above and send to her and when she set up the op date then she would include on preop sheet to have cbc and it be faxed to cancer center.  I gave her our fax number and she gave me [phone] and I faxed letter 05/15/2015

## 2015-05-16 ENCOUNTER — Telehealth: Payer: Self-pay | Admitting: *Deleted

## 2015-05-16 NOTE — Telephone Encounter (Signed)
Would like hematology clearance letter written with Dr. Leighton Ruff recommendations.  Orthopedics can post shoulder surgery on Thursday this week. Last hgb at office 8.3. Pre-op .  I spoke to Dr. Rudean Hitt who states that "orthopedics needs to determine the perimeters of an acceptable hgb. Based on hgb, patient may need a transfusion prior to the surgery; however orthopedics needs to make this call."

## 2015-05-18 ENCOUNTER — Inpatient Hospital Stay: Payer: Medicare Other | Attending: Internal Medicine

## 2015-05-18 DIAGNOSIS — D509 Iron deficiency anemia, unspecified: Secondary | ICD-10-CM | POA: Insufficient documentation

## 2015-05-18 LAB — BASIC METABOLIC PANEL
Anion gap: 4 — ABNORMAL LOW (ref 5–15)
BUN: 10 mg/dL (ref 6–20)
CALCIUM: 8.2 mg/dL — AB (ref 8.9–10.3)
CO2: 28 mmol/L (ref 22–32)
CREATININE: 0.66 mg/dL (ref 0.44–1.00)
Chloride: 104 mmol/L (ref 101–111)
GFR calc non Af Amer: >60 mL/min (ref >60)
GLUCOSE: 95 mg/dL (ref 65–99)
Potassium: 3.9 mmol/L (ref 3.5–5.1)
Sodium: 136 mmol/L (ref 135–145)

## 2015-05-18 LAB — CBC WITH DIFFERENTIAL/PLATELET
BASOS ABS: 0 10*3/uL (ref 0–0.1)
Basophils Relative: 1 %
EOS PCT: 1 %
Eosinophils Absolute: 0.1 10*3/uL (ref 0–0.7)
HCT: 27.8 % — ABNORMAL LOW (ref 35.0–47.0)
Hemoglobin: 8.9 g/dL — ABNORMAL LOW (ref 12.0–16.0)
LYMPHS PCT: 15 %
Lymphs Abs: 1.4 10*3/uL (ref 1.0–3.6)
MCH: 26.4 pg (ref 26.0–34.0)
MCHC: 31.9 g/dL — AB (ref 32.0–36.0)
MCV: 82.9 fL (ref 80.0–100.0)
MONO ABS: 0.8 10*3/uL (ref 0.2–0.9)
MONOS PCT: 8 %
Neutro Abs: 7.3 10*3/uL — ABNORMAL HIGH (ref 1.4–6.5)
Neutrophils Relative %: 75 %
PLATELETS: 402 10*3/uL (ref 150–440)
RBC: 3.35 MIL/uL — ABNORMAL LOW (ref 3.80–5.20)
RDW: 18.8 % — AB (ref 11.5–14.5)
WBC: 9.6 10*3/uL (ref 3.6–11.0)

## 2015-05-18 LAB — FERRITIN: Ferritin: 338 ng/mL — ABNORMAL HIGH (ref 11–307)

## 2015-05-18 LAB — SAMPLE TO BLOOD BANK

## 2015-05-19 ENCOUNTER — Inpatient Hospital Stay: Admission: RE | Admit: 2015-05-19 | Payer: Medicare Other | Source: Ambulatory Visit

## 2015-05-20 ENCOUNTER — Encounter: Payer: Self-pay | Admitting: Urology

## 2015-05-20 ENCOUNTER — Ambulatory Visit (INDEPENDENT_AMBULATORY_CARE_PROVIDER_SITE_OTHER): Payer: Medicare Other | Admitting: Urology

## 2015-05-20 VITALS — BP 156/70 | HR 84 | Ht 61.0 in | Wt 142.7 lb

## 2015-05-20 DIAGNOSIS — C675 Malignant neoplasm of bladder neck: Secondary | ICD-10-CM | POA: Diagnosis not present

## 2015-05-20 LAB — URINALYSIS, COMPLETE
Bilirubin, UA: NEGATIVE
GLUCOSE, UA: NEGATIVE
Ketones, UA: NEGATIVE
Nitrite, UA: NEGATIVE
Specific Gravity, UA: 1.015 (ref 1.005–1.030)
UUROB: 0.2 mg/dL (ref 0.2–1.0)
pH, UA: 7 (ref 5.0–7.5)

## 2015-05-20 LAB — MICROSCOPIC EXAMINATION

## 2015-05-20 MED ORDER — LIDOCAINE HCL 2 % EX GEL
1.0000 "application " | Freq: Once | CUTANEOUS | Status: AC
  Administered 2015-05-20: 1 via URETHRAL

## 2015-05-20 MED ORDER — BCG LIVE 50 MG IS SUSR
3.2400 mL | Freq: Once | INTRAVESICAL | Status: AC
  Administered 2015-05-20: 81 mg via INTRAVESICAL

## 2015-05-20 NOTE — Progress Notes (Signed)
9:34 PM   [name] 1933/01/17 [id number]  Referring provider: Kirk Ruths, MD [place], [place] [address]  Chief Complaint  Patient presents with  . Bladder Cancer    3rd BCG Maintenance treatment    HPI: 81 year old nonsmoker with incidental 11 mm papillary neoplasm near the RIGHT bladder neck. She was taken to the operating room on 09/29/2014 for TURBT, Bilateral retrograde pyelogram at which time a 2 cm extremely spherical mass was identified just proximal to the RIGHT UO. This was resect ed along with deeper biopsies of the base. Of note, bilateral retrograde pyelogram was negative for any obvious upper tract filling defects or hydronephrosis.  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 The tumor had an inverted architecture. Deeper biopsies did contain muscle after speaking with the pathologist today and the muscle was negative for any tumor. Additionally, CIS was noted.  She returned to the operating room on 11/02/2014 for restaging TURBT. Pathology showed no evidence of residual tumor.  She completed BCG x 6 for induction which was well tolerated.   Last surveillance cystoscopy performed on 04/29/15 by Dr. Erlene Quan negative. Recommendations made at that visit to begin BCG maintenance therapy once a week 3 doses. Patient presents today for 3rd BCG maintenance instillations.    Patient reports that she is feeling well today. She denies any urinary symptoms including dysuria or gross hematuria. No fevers or flank pain.  PMH: Past Medical History  Diagnosis Date  . Hypertension   . Anemia   . DDD (degenerative disc disease), cervical   . DDD (degenerative disc disease), lumbar   . Elevated lipids   . Osteoporosis   . Glaucoma   . Multiple gastric ulcers   . Foot pain   . Colon polyp   . Chronic low back pain   . Cancer [facility]) 2016    bladder tumor  . Carotid  arterial disease ([place]) 01/09/2014    Overview:  Minimal on screening     Surgical History: Past Surgical History  Procedure Laterality Date  . Back surgery      x 6  . Carpal tunnel release Right   . Wrist fracture surgery Left   . Abdominal hysterectomy    . Knee arthroscopy Right     x2  . Mandible surgery Bilateral     x2  . Cataract extraction Bilateral   . Transurethral resection of bladder tumor N/A 09/29/2014    Procedure: TRANSURETHRAL RESECTION OF BLADDER TUMOR (TURBT);  Surgeon: Hollice Espy, MD;  Location: [facility] ORS;  Service: Urology;  Laterality: N/A;  . Cystoscopy w/ retrogrades Bilateral 09/29/2014    Procedure: CYSTOSCOPY WITH RETROGRADE PYELOGRAM;  Surgeon: Hollice Espy, MD;  Location: [facility] ORS;  Service: Urology;  Laterality: Bilateral;  . Eye surgery    . Cystoscopy with biopsy N/A 11/02/2014    Procedure: CYSTOSCOPY WITH BIOPSY/WITH MITOMYCIN;  Surgeon: Hollice Espy, MD;  Location: [facility] ORS;  Service: Urology;  Laterality: N/A;  . Foot surgery Right   . Esophagogastroduodenoscopy (egd) with propofol N/A 05/10/2015    Procedure: ESOPHAGOGASTRODUODENOSCOPY (EGD) WITH PROPOFOL;  Surgeon: Lollie Sails, MD;  Location[facility] ENDOSCOPY;  Service: Endoscopy;  Laterality: N/A;    Home Medications:    Medication List       This list is accurate as of: 05/20/15  9:34 PM.  Always use your most recent med list.  acetaminophen 650 MG CR tablet  Commonly known as:  TYLENOL  Take 650 mg by mouth every 8 (eight) hours as needed for pain.     Calcium Carb-Ergocalciferol 250-125 MG-UNIT Tabs  Take 1 tablet by mouth.     calcium-vitamin D 500-200 MG-UNIT tablet  Commonly known as:  OSCAL WITH D  Take 2 tablets by mouth 2 (two) times daily.     carvedilol 25 MG tablet  Commonly known as:  COREG  Take 25 mg by mouth 2 (two) times daily with a meal.     diltiazem 240 MG 24 hr capsule  Commonly known as:  CARDIZEM CD  Take 240 mg by mouth daily.      ferrous sulfate 325 (65 FE) MG tablet  Take 325 mg by mouth daily with breakfast.     latanoprost 0.005 % ophthalmic solution  Commonly known as:  XALATAN  Place 1 drop into both eyes at bedtime.     pantoprazole 40 MG tablet  Commonly known as:  PROTONIX     pravastatin 40 MG tablet  Commonly known as:  PRAVACHOL  Take 40 mg by mouth daily at 6 PM.     raloxifene 60 MG tablet  Commonly known as:  EVISTA  Take 60 mg by mouth daily.     timolol 0.5 % ophthalmic solution  Commonly known as:  TIMOPTIC  1 drop.     vitamin C 1000 MG tablet  Take 1,000 mg by mouth daily.        Allergies:  Allergies  Allergen Reactions  . Keflex [Cephalexin] Nausea Only    Family History: Family History  Problem Relation Age of Onset  . Heart attack Father   . Emphysema Father   . Diabetes type II Sister   . Heart disease Mother   . Hypertension Mother   . Kidney disease Neg Hx   . Bladder Cancer Neg Hx   . Breast cancer Neg Hx     Social History:  reports that she has never smoked. She has never used smokeless tobacco. She reports that she does not drink alcohol or use illicit drugs.  ROS: UROLOGY Frequent Urination?: No Hard to postpone urination?: No Burning/pain with urination?: No Get up at night to urinate?: No Leakage of urine?: No Urine stream starts and stops?: No Trouble starting stream?: No Do you have to strain to urinate?: No Blood in urine?: No Urinary tract infection?: No Sexually transmitted disease?: No Injury to kidneys or bladder?: No Painful intercourse?: No Weak stream?: No Currently pregnant?: No Vaginal bleeding?: No Last menstrual period?: n  Gastrointestinal Nausea?: No Vomiting?: No Indigestion/heartburn?: No Diarrhea?: No Constipation?: No  Constitutional Fever: No Night sweats?: No Weight loss?: No Fatigue?: No  Skin Skin rash/lesions?: No Itching?: No  Eyes Blurred vision?: No Double vision?:  No  Ears/Nose/Throat Sore throat?: No Sinus problems?: No  Hematologic/Lymphatic Swollen glands?: No Easy bruising?: No  Cardiovascular Leg swelling?: No Chest pain?: No  Respiratory Cough?: No Shortness of breath?: No  Endocrine Excessive thirst?: No  Musculoskeletal Back pain?: Yes Joint pain?: No  Neurological Headaches?: No Dizziness?: No  Psychologic Depression?: No Anxiety?: No  Physical Exam: BP 156/70 mmHg  Pulse 84  Ht 5\' 1"  (1.549 m)  Wt 142 lb 11.2 oz (64.728 kg)  BMI 26.98 kg/m2  Constitutional:  Alert and oriented, No acute distress. HEENT: [place] AT, moist mucus membranes.  Trachea midline, no masses. Cardiovascular: No clubbing, cyanosis, or edema. Respiratory: Normal respiratory effort,  no increased work of breathing. GU: normal external vaginal exam, normal urinary meatus Skin: No rashes, bruises or suspicious lesions. Neurologic: Grossly intact, no focal deficits, moving all 4 extremities. Psychiatric: Normal mood and affect.  Laboratory Data:   Urinalysis Results for orders placed or performed in visit on 05/20/15  Microscopic Examination  Result Value Ref Range   WBC, UA >30 (H) 0 -  5 /hpf   RBC, UA 3-10 (A) 0 -  2 /hpf   Epithelial Cells (non renal) 0-10 0 - 10 /hpf   Mucus, UA Present (A) Not Estab.   Bacteria, UA Moderate (A) None seen/Few  Urinalysis, Complete  Result Value Ref Range   Specific Gravity, UA 1.015 1.005 - 1.030   pH, UA 7.0 5.0 - 7.5   Color, UA Yellow Yellow   Appearance Ur Clear Clear   Leukocytes, UA 1+ (A) Negative   Protein, UA 1+ (A) Negative/Trace   Glucose, UA Negative Negative   Ketones, UA Negative Negative   RBC, UA 1+ (A) Negative   Bilirubin, UA Negative Negative   Urobilinogen, Ur 0.2 0.2 - 1.0 mg/dL   Nitrite, UA Negative Negative   Microscopic Examination See below:     Assessment & Plan:    1. Malignant neoplasm of bladder neck ([place])- UA unremarkable today. 3 of 3 maintenance BCG  installations completed today.  Patient tolerated procedure well.  Cystoscopy as scheduled.   - Urinalysis, Complete (cath specimen did not identify bacteria)   No Follow-up on file.  These notes generated with voice recognition software. I apologize for typographical errors.  Zara Council, [facility] [address] [place], [place] [address] [phone]

## 2015-05-24 ENCOUNTER — Telehealth: Payer: Self-pay | Admitting: *Deleted

## 2015-05-24 ENCOUNTER — Encounter
Admission: RE | Admit: 2015-05-24 | Discharge: 2015-05-24 | Disposition: A | Discharge status: Home / Self Care | Payer: Medicare Other | Source: Ambulatory Visit | Attending: Unknown Physician Specialty | Admitting: Unknown Physician Specialty

## 2015-05-24 DIAGNOSIS — Z8262 Family history of osteoporosis: Secondary | ICD-10-CM | POA: Diagnosis not present

## 2015-05-24 DIAGNOSIS — Z8261 Family history of arthritis: Secondary | ICD-10-CM | POA: Diagnosis not present

## 2015-05-24 DIAGNOSIS — Z8551 Personal history of malignant neoplasm of bladder: Secondary | ICD-10-CM | POA: Diagnosis not present

## 2015-05-24 DIAGNOSIS — M4800 Spinal stenosis, site unspecified: Secondary | ICD-10-CM | POA: Diagnosis not present

## 2015-05-24 DIAGNOSIS — M7541 Impingement syndrome of right shoulder: Secondary | ICD-10-CM | POA: Diagnosis not present

## 2015-05-24 DIAGNOSIS — I739 Peripheral vascular disease, unspecified: Secondary | ICD-10-CM | POA: Diagnosis not present

## 2015-05-24 DIAGNOSIS — M810 Age-related osteoporosis without current pathological fracture: Secondary | ICD-10-CM | POA: Diagnosis not present

## 2015-05-24 DIAGNOSIS — Z79899 Other long term (current) drug therapy: Secondary | ICD-10-CM | POA: Diagnosis not present

## 2015-05-24 DIAGNOSIS — Z90710 Acquired absence of both cervix and uterus: Secondary | ICD-10-CM | POA: Diagnosis not present

## 2015-05-24 DIAGNOSIS — M5440 Lumbago with sciatica, unspecified side: Secondary | ICD-10-CM | POA: Diagnosis not present

## 2015-05-24 DIAGNOSIS — E785 Hyperlipidemia, unspecified: Secondary | ICD-10-CM | POA: Diagnosis not present

## 2015-05-24 DIAGNOSIS — Z8249 Family history of ischemic heart disease and other diseases of the circulatory system: Secondary | ICD-10-CM | POA: Diagnosis not present

## 2015-05-24 DIAGNOSIS — Z833 Family history of diabetes mellitus: Secondary | ICD-10-CM | POA: Diagnosis not present

## 2015-05-24 DIAGNOSIS — D649 Anemia, unspecified: Secondary | ICD-10-CM | POA: Diagnosis not present

## 2015-05-24 DIAGNOSIS — Z825 Family history of asthma and other chronic lower respiratory diseases: Secondary | ICD-10-CM | POA: Diagnosis not present

## 2015-05-24 DIAGNOSIS — Z881 Allergy status to other antibiotic agents status: Secondary | ICD-10-CM | POA: Diagnosis not present

## 2015-05-24 DIAGNOSIS — Z981 Arthrodesis status: Secondary | ICD-10-CM | POA: Diagnosis not present

## 2015-05-24 DIAGNOSIS — M75101 Unspecified rotator cuff tear or rupture of right shoulder, not specified as traumatic: Secondary | ICD-10-CM | POA: Diagnosis not present

## 2015-05-24 DIAGNOSIS — G8929 Other chronic pain: Secondary | ICD-10-CM | POA: Diagnosis not present

## 2015-05-24 DIAGNOSIS — F17210 Nicotine dependence, cigarettes, uncomplicated: Secondary | ICD-10-CM | POA: Diagnosis not present

## 2015-05-24 DIAGNOSIS — K2210 Ulcer of esophagus without bleeding: Secondary | ICD-10-CM | POA: Diagnosis not present

## 2015-05-24 DIAGNOSIS — I8310 Varicose veins of unspecified lower extremity with inflammation: Secondary | ICD-10-CM | POA: Diagnosis not present

## 2015-05-24 DIAGNOSIS — M1990 Unspecified osteoarthritis, unspecified site: Secondary | ICD-10-CM | POA: Diagnosis not present

## 2015-05-24 DIAGNOSIS — E042 Nontoxic multinodular goiter: Secondary | ICD-10-CM | POA: Diagnosis not present

## 2015-05-24 DIAGNOSIS — I10 Essential (primary) hypertension: Secondary | ICD-10-CM | POA: Diagnosis not present

## 2015-05-24 DIAGNOSIS — K219 Gastro-esophageal reflux disease without esophagitis: Secondary | ICD-10-CM | POA: Diagnosis not present

## 2015-05-24 DIAGNOSIS — Z86010 Personal history of colonic polyps: Secondary | ICD-10-CM | POA: Diagnosis not present

## 2015-05-24 DIAGNOSIS — M7521 Bicipital tendinitis, right shoulder: Secondary | ICD-10-CM | POA: Diagnosis not present

## 2015-05-24 LAB — HEMOGLOBIN: HEMOGLOBIN: 8.9 g/dL — AB (ref 12.0–16.0)

## 2015-05-24 LAB — ABO/RH: ABO/RH(D): A POS

## 2015-05-24 NOTE — Patient Instructions (Signed)
  Your procedure is scheduled on: Wednesday 05/25/2015 Report to Day Surgery. 2ND FLOOR MEDICAL MALL ENTRANCE To find out your arrival time please call [phone] between 1PM - 3PM on TODAY.  Remember: Instructions that are not followed completely may result in serious medical risk, up to and including death, or upon the discretion of your surgeon and anesthesiologist your surgery may need to be rescheduled.    __X__ 1. Do not eat food or drink liquids after midnight. No gum chewing or hard candies.     __X__ 2. No Alcohol for 24 hours before or after surgery.   ____ 3. Bring all medications with you on the day of surgery if instructed.    __X__ 4. Notify your doctor if there is any change in your medical condition     (cold, fever, infections).     Do not wear jewelry, make-up, hairpins, clips or nail polish.  Do not wear lotions, powders, or perfumes.   Do not shave 48 hours prior to surgery. Men may shave face and neck.  Do not bring valuables to the hospital.    [facility] is not responsible for any belongings or valuables.               Contacts, dentures or bridgework may not be worn into surgery.  Leave your suitcase in the car. After surgery it may be brought to your room.  For patients admitted to the hospital, discharge time is determined by your                treatment team.   Patients discharged the day of surgery will not be allowed to drive home.   Please read over the following fact sheets that you were given:   Surgical Site Infection Prevention   _X__ Take these medicines the morning of surgery with A SIP OF WATER:    1. CARVEDILOL  2. DILTIAZEM  3. PANTOPRAZOLE  4.  5.  6.  ____ Fleet Enema (as directed)   __X__ Use CHG Soap as directed  ____ Use inhalers on the day of surgery  ____ Stop metformin 2 days prior to surgery    ____ Take 1/2 of usual insulin dose the night before surgery and none on the morning of surgery.   ____ Stop  Coumadin/Plavix/aspirin on   ____ Stop Anti-inflammatories on    ____ Stop supplements until after surgery.    ____ Bring C-Pap to the hospital.

## 2015-05-24 NOTE — Telephone Encounter (Signed)
I first was contacted by sherrie in preop about whether or not pt needs blood prior to surgery tom.  And they were told that pt was going to have surgery 10:30 and did not know how to work out blood and surgery because it would overlap.  I told Sherrie that last week when I spoke to Freda Munro -nurse for Dr. Jefm Bryant that the doctor reviewed the MRI again and feels that she has tear and it is bigger than he thought and the method of going in arthroscopic will no longer be the method and therefore she has the ability to loose more blood so Dr. Jefm Bryant states he would like her to have a unit of blood before the surgery and Sherrie now that I have told her this has worked it out with [facility] that pt will come there in am and Becky from [facility] will call pt with what time to come.  Freda Munro will call preop and give orders for 1 unit tom. Pt already was type and crossed.  Called pt and she put me on hold while she spoke Becky in [facility] and gave her a time to come.  Then pt spoke to me and I told her that Dr. Jefm Bryant looked at MRI again and changed his approach to repair her shoulder and therefore would have more possible blood loss and wanted her to have blood but it will be in [facility] and she already knows  the time to come into [facility] and she does not need to come to cancer center tom. And I have cancelled the appt.

## 2015-05-25 ENCOUNTER — Ambulatory Visit: Payer: Medicare Other | Admitting: Anesthesiology

## 2015-05-25 ENCOUNTER — Encounter
Admission: RE | Disposition: A | Discharge status: Home / Self Care | Payer: Self-pay | Source: Ambulatory Visit | Attending: Unknown Physician Specialty

## 2015-05-25 ENCOUNTER — Ambulatory Visit
Admission: RE | Admit: 2015-05-25 | Discharge: 2015-05-25 | Disposition: A | Discharge status: Home / Self Care | Payer: Medicare Other | Source: Ambulatory Visit | Attending: Unknown Physician Specialty | Admitting: Unknown Physician Specialty

## 2015-05-25 ENCOUNTER — Encounter: Payer: Self-pay | Admitting: *Deleted

## 2015-05-25 ENCOUNTER — Inpatient Hospital Stay: Payer: Medicare Other

## 2015-05-25 DIAGNOSIS — M7541 Impingement syndrome of right shoulder: Secondary | ICD-10-CM | POA: Diagnosis not present

## 2015-05-25 DIAGNOSIS — Z8262 Family history of osteoporosis: Secondary | ICD-10-CM | POA: Insufficient documentation

## 2015-05-25 DIAGNOSIS — E042 Nontoxic multinodular goiter: Secondary | ICD-10-CM | POA: Insufficient documentation

## 2015-05-25 DIAGNOSIS — Z981 Arthrodesis status: Secondary | ICD-10-CM | POA: Insufficient documentation

## 2015-05-25 DIAGNOSIS — M7521 Bicipital tendinitis, right shoulder: Secondary | ICD-10-CM | POA: Insufficient documentation

## 2015-05-25 DIAGNOSIS — M1990 Unspecified osteoarthritis, unspecified site: Secondary | ICD-10-CM | POA: Insufficient documentation

## 2015-05-25 DIAGNOSIS — Z833 Family history of diabetes mellitus: Secondary | ICD-10-CM | POA: Insufficient documentation

## 2015-05-25 DIAGNOSIS — Z8551 Personal history of malignant neoplasm of bladder: Secondary | ICD-10-CM | POA: Insufficient documentation

## 2015-05-25 DIAGNOSIS — I10 Essential (primary) hypertension: Secondary | ICD-10-CM | POA: Insufficient documentation

## 2015-05-25 DIAGNOSIS — I739 Peripheral vascular disease, unspecified: Secondary | ICD-10-CM | POA: Insufficient documentation

## 2015-05-25 DIAGNOSIS — Z90710 Acquired absence of both cervix and uterus: Secondary | ICD-10-CM | POA: Insufficient documentation

## 2015-05-25 DIAGNOSIS — K219 Gastro-esophageal reflux disease without esophagitis: Secondary | ICD-10-CM | POA: Insufficient documentation

## 2015-05-25 DIAGNOSIS — M5440 Lumbago with sciatica, unspecified side: Secondary | ICD-10-CM | POA: Insufficient documentation

## 2015-05-25 DIAGNOSIS — Z86010 Personal history of colonic polyps: Secondary | ICD-10-CM | POA: Insufficient documentation

## 2015-05-25 DIAGNOSIS — E785 Hyperlipidemia, unspecified: Secondary | ICD-10-CM | POA: Insufficient documentation

## 2015-05-25 DIAGNOSIS — M810 Age-related osteoporosis without current pathological fracture: Secondary | ICD-10-CM | POA: Insufficient documentation

## 2015-05-25 DIAGNOSIS — F17210 Nicotine dependence, cigarettes, uncomplicated: Secondary | ICD-10-CM | POA: Insufficient documentation

## 2015-05-25 DIAGNOSIS — M75101 Unspecified rotator cuff tear or rupture of right shoulder, not specified as traumatic: Secondary | ICD-10-CM | POA: Insufficient documentation

## 2015-05-25 DIAGNOSIS — M4800 Spinal stenosis, site unspecified: Secondary | ICD-10-CM | POA: Insufficient documentation

## 2015-05-25 DIAGNOSIS — Z825 Family history of asthma and other chronic lower respiratory diseases: Secondary | ICD-10-CM | POA: Insufficient documentation

## 2015-05-25 DIAGNOSIS — D649 Anemia, unspecified: Secondary | ICD-10-CM | POA: Insufficient documentation

## 2015-05-25 DIAGNOSIS — K2210 Ulcer of esophagus without bleeding: Secondary | ICD-10-CM | POA: Insufficient documentation

## 2015-05-25 DIAGNOSIS — I8310 Varicose veins of unspecified lower extremity with inflammation: Secondary | ICD-10-CM | POA: Insufficient documentation

## 2015-05-25 DIAGNOSIS — Z79899 Other long term (current) drug therapy: Secondary | ICD-10-CM | POA: Insufficient documentation

## 2015-05-25 DIAGNOSIS — Z8249 Family history of ischemic heart disease and other diseases of the circulatory system: Secondary | ICD-10-CM | POA: Insufficient documentation

## 2015-05-25 DIAGNOSIS — Z881 Allergy status to other antibiotic agents status: Secondary | ICD-10-CM | POA: Insufficient documentation

## 2015-05-25 DIAGNOSIS — G8929 Other chronic pain: Secondary | ICD-10-CM | POA: Insufficient documentation

## 2015-05-25 DIAGNOSIS — Z8261 Family history of arthritis: Secondary | ICD-10-CM | POA: Insufficient documentation

## 2015-05-25 HISTORY — PX: SHOULDER ARTHROSCOPY WITH ROTATOR CUFF REPAIR AND SUBACROMIAL DECOMPRESSION: SHX5686

## 2015-05-25 LAB — PREPARE RBC (CROSSMATCH)

## 2015-05-25 SURGERY — SHOULDER ARTHROSCOPY WITH ROTATOR CUFF REPAIR AND SUBACROMIAL DECOMPRESSION
Anesthesia: General | Site: Shoulder | Laterality: Right | Wound class: Clean

## 2015-05-25 MED ORDER — ROPIVACAINE HCL 2 MG/ML IJ SOLN
INTRAMUSCULAR | Status: AC
  Filled 2015-05-25: qty 40

## 2015-05-25 MED ORDER — EPHEDRINE SULFATE 50 MG/ML IJ SOLN
INTRAMUSCULAR | Status: DC | PRN
  Administered 2015-05-25: 10 mg via INTRAVENOUS
  Administered 2015-05-25 (×2): 5 mg via INTRAVENOUS

## 2015-05-25 MED ORDER — ROCURONIUM BROMIDE 100 MG/10ML IV SOLN
INTRAVENOUS | Status: DC | PRN
  Administered 2015-05-25: 40 mg via INTRAVENOUS

## 2015-05-25 MED ORDER — PROPOFOL 10 MG/ML IV BOLUS
INTRAVENOUS | Status: DC | PRN
  Administered 2015-05-25: 100 mg via INTRAVENOUS

## 2015-05-25 MED ORDER — FENTANYL CITRATE (PF) 100 MCG/2ML IJ SOLN
INTRAMUSCULAR | Status: AC
  Administered 2015-05-25: 50 ug
  Filled 2015-05-25: qty 2

## 2015-05-25 MED ORDER — FENTANYL CITRATE (PF) 100 MCG/2ML IJ SOLN
INTRAMUSCULAR | Status: DC | PRN
Start: 2015-05-25 — End: 2015-05-25
  Administered 2015-05-25 (×2): 25 ug via INTRAVENOUS

## 2015-05-25 MED ORDER — ONDANSETRON HCL 4 MG/2ML IJ SOLN
INTRAMUSCULAR | Status: DC | PRN
  Administered 2015-05-25: 4 mg via INTRAVENOUS

## 2015-05-25 MED ORDER — DEXAMETHASONE SODIUM PHOSPHATE 10 MG/ML IJ SOLN
INTRAMUSCULAR | Status: DC | PRN
  Administered 2015-05-25: 10 mg via INTRAVENOUS

## 2015-05-25 MED ORDER — MIDAZOLAM HCL 5 MG/5ML IJ SOLN
INTRAMUSCULAR | Status: AC
  Administered 2015-05-25: 1.5 mg
  Filled 2015-05-25: qty 5

## 2015-05-25 MED ORDER — GLYCOPYRROLATE 0.2 MG/ML IJ SOLN
INTRAMUSCULAR | Status: DC | PRN
  Administered 2015-05-25: 0.2 mg via INTRAVENOUS
  Administered 2015-05-25: 0.4 mg via INTRAVENOUS

## 2015-05-25 MED ORDER — LIDOCAINE HCL (CARDIAC) 20 MG/ML IV SOLN
INTRAVENOUS | Status: DC | PRN
Start: 2015-05-25 — End: 2015-05-25
  Administered 2015-05-25: 100 mg via INTRAVENOUS

## 2015-05-25 MED ORDER — CLINDAMYCIN PHOSPHATE 600 MG/50ML IV SOLN
600.0000 mg | Freq: Once | INTRAVENOUS | Status: AC
  Administered 2015-05-25: 600 mg via INTRAVENOUS

## 2015-05-25 MED ORDER — EPINEPHRINE HCL 1 MG/ML IJ SOLN
INTRAMUSCULAR | Status: AC
  Filled 2015-05-25: qty 1

## 2015-05-25 MED ORDER — OXYCODONE-ACETAMINOPHEN 5-325 MG PO TABS: 1.0000 | ORAL_TABLET | ORAL | Status: DC | PRN

## 2015-05-25 MED ORDER — FENTANYL CITRATE (PF) 100 MCG/2ML IJ SOLN: 25.0000 ug | INTRAMUSCULAR | Status: DC | PRN

## 2015-05-25 MED ORDER — LIDOCAINE HCL (PF) 1 % IJ SOLN
INTRAMUSCULAR | Status: AC
  Filled 2015-05-25: qty 5

## 2015-05-25 MED ORDER — CLINDAMYCIN PHOSPHATE 600 MG/50ML IV SOLN
INTRAVENOUS | Status: AC
  Administered 2015-05-25: 600 mg via INTRAVENOUS
  Filled 2015-05-25: qty 50

## 2015-05-25 MED ORDER — ONDANSETRON HCL 4 MG/2ML IJ SOLN: 4.0000 mg | Freq: Once | INTRAMUSCULAR | Status: DC | PRN

## 2015-05-25 MED ORDER — LACTATED RINGERS IV SOLN
INTRAVENOUS | Status: DC
Start: 2015-05-25 — End: 2015-05-25
  Administered 2015-05-25: 13:00:00 via INTRAVENOUS

## 2015-05-25 MED ORDER — EPINEPHRINE HCL 1 MG/ML IJ SOLN
INTRAMUSCULAR | Status: DC | PRN
Start: 2015-05-25 — End: 2015-05-25
  Administered 2015-05-25: 3 mL

## 2015-05-25 MED ORDER — NEOSTIGMINE METHYLSULFATE 10 MG/10ML IV SOLN
INTRAVENOUS | Status: DC | PRN
  Administered 2015-05-25: 2 mg via INTRAVENOUS

## 2015-05-25 SURGICAL SUPPLY — 83 items
ADAPTER IRRIG TUBE 2 SPIKE SOL (ADAPTER) ×3 IMPLANT
ADPR TBG 2 SPK PMP STRL ASCP (ADAPTER) ×1
ANCH SUT Q-FX 2.8 (Anchor) ×2 IMPLANT
ANCHOR ALL-SUT Q-FIX 2.8 (Anchor) ×4 IMPLANT
ANCHOR SUT 5.5 SPEEDSCREW (Screw) ×4 IMPLANT
ARTHROWAND PARAGON T2 (SURGICAL WAND)
BLADE ABRADER 4.5 (BLADE) ×5 IMPLANT
BLADE SHAVER 4.5X7 STR FR (MISCELLANEOUS) ×5 IMPLANT
BLADE SURG 15 STRL LF DISP TIS (BLADE) IMPLANT
BLADE SURG 15 STRL SS (BLADE)
BUR ABRADER 5.5 BLK (MISCELLANEOUS) ×1 IMPLANT
BUR BR 5.5 WIDE MOUTH (BURR) ×5 IMPLANT
BURR ABRADER 5.5 BLK (MISCELLANEOUS) ×6
CANNULA 8.5X75 THRED (CANNULA) IMPLANT
CANNULA SHAVER 8MMX76MM (CANNULA) IMPLANT
CAP LOCK ULTRA CANNULA (MISCELLANEOUS) IMPLANT
CHLORAPREP W/TINT 26ML (MISCELLANEOUS) ×6 IMPLANT
DRAPE STERI 35X30 U-POUCH (DRAPES) ×3 IMPLANT
GAUZE SPONGE 4X4 12PLY STRL (GAUZE/BANDAGES/DRESSINGS) ×3 IMPLANT
GLOVE BIO SURGEON STRL SZ7.5 (GLOVE) ×3 IMPLANT
GLOVE BIO SURGEON STRL SZ8 (GLOVE) ×3 IMPLANT
GLOVE INDICATOR 8.0 STRL GRN (GLOVE) ×3 IMPLANT
GOWN STRL REUS W/ TWL LRG LVL3 (GOWN DISPOSABLE) ×1 IMPLANT
GOWN STRL REUS W/TWL LRG LVL3 (GOWN DISPOSABLE) ×3
GOWN STRL REUS W/TWL LRG LVL4 (GOWN DISPOSABLE) ×3 IMPLANT
IV LACTATED RINGER IRRG 3000ML (IV SOLUTION) ×9
IV LR IRRIG 3000ML ARTHROMATIC (IV SOLUTION) ×1 IMPLANT
KIT SHOULDER TRACTION (DRAPES) ×3 IMPLANT
KIT SUTURE 2.8 Q-FIX DISP (MISCELLANEOUS) ×2 IMPLANT
MANIFOLD 4PT FOR NEPTUNE1 (MISCELLANEOUS) ×3 IMPLANT
NDL 18GX1X1/2 (RX/OR ONLY) (NEEDLE) IMPLANT
NDL MAYO CATGUT SZ 2 (NEEDLE) IMPLANT
NDL MAYO CATGUT SZ4 TPR NDL (NEEDLE) IMPLANT
NDL SAFETY ECLIPSE 18X1.5 (NEEDLE) IMPLANT
NDL SPNL 18GX3.5 QUINCKE PK (NEEDLE) IMPLANT
NEEDLE 18GX1X1/2 (RX/OR ONLY) (NEEDLE) IMPLANT
NEEDLE HYPO 18GX1.5 SHARP (NEEDLE) ×3
NEEDLE MAYO CATGUT SZ 1.5 (NEEDLE)
NEEDLE MAYO CATGUT SZ 2 (NEEDLE) IMPLANT
NEEDLE MAYO CATGUT SZ4 (NEEDLE) ×3 IMPLANT
NEEDLE SPNL 18GX3.5 QUINCKE PK (NEEDLE) IMPLANT
PACK ARTHROSCOPY SHOULDER (MISCELLANEOUS) ×3 IMPLANT
PAD GROUND ADULT SPLIT (MISCELLANEOUS) ×3 IMPLANT
PASSER SUT CAPTURE FIRST (SUTURE) ×2 IMPLANT
SET TUBE SUCT SHAVER OUTFL 24K (TUBING) ×3 IMPLANT
SLING ULTRA II M (MISCELLANEOUS) ×2 IMPLANT
SOL PREP PVP 2OZ (MISCELLANEOUS) ×3
SOLUTION PREP PVP 2OZ (MISCELLANEOUS) ×1 IMPLANT
STAPLER SKIN PROX 35W (STAPLE) IMPLANT
SUT ETHIBOND NAB CT1 #1 30IN (SUTURE) ×2 IMPLANT
SUT ETHILON 3-0 FS-10 30 BLK (SUTURE)
SUT ETHILON 3-0 KS 30 BLK (SUTURE) ×2 IMPLANT
SUT PDS AB 1 CT1 27 (SUTURE) IMPLANT
SUT PERFECTPASSER WHITE CART (SUTURE) IMPLANT
SUT PROLENE 2 0 CT2 30 (SUTURE) IMPLANT
SUT SMART STITCH CARTRIDGE (SUTURE) IMPLANT
SUT TICRON 2-0 30IN 311381 (SUTURE) IMPLANT
SUT VIC AB 0 CT1 36 (SUTURE) IMPLANT
SUT VIC AB 0 CT2 27 (SUTURE) IMPLANT
SUT VIC AB 0 SH 27 (SUTURE) ×2 IMPLANT
SUT VIC AB 2-0 CT1 27 (SUTURE)
SUT VIC AB 2-0 CT1 TAPERPNT 27 (SUTURE) IMPLANT
SUT VIC AB 2-0 CT2 27 (SUTURE) IMPLANT
SUT VIC AB 2-0 SH 27 (SUTURE) ×3
SUT VIC AB 2-0 SH 27XBRD (SUTURE) IMPLANT
SUT VIC AB 3-0 SH 27 (SUTURE)
SUT VIC AB 3-0 SH 27X BRD (SUTURE) IMPLANT
SUTURE EHLN 3-0 FS-10 30 BLK (SUTURE) IMPLANT
SUTURE MAGNUM WIRE 2X48 BLK (SUTURE) IMPLANT
SYR 30ML LL (SYRINGE) ×2 IMPLANT
SYRINGE 10CC LL (SYRINGE) IMPLANT
TAPE MICROFOAM 4IN (TAPE) ×3 IMPLANT
TUBING ARTHRO INFLOW-ONLY STRL (TUBING) ×3 IMPLANT
WAND 30 DEG SABER W/CORD (SURGICAL WAND) IMPLANT
WAND ARTHRO PARAGON T2 (SURGICAL WAND) IMPLANT
WAND COVAC 50 IFS (MISCELLANEOUS) IMPLANT
WAND COVATOR 20 (MISCELLANEOUS) IMPLANT
WAND HAND CNTRL MULTIVAC 50 (MISCELLANEOUS) IMPLANT
WAND HAND CNTRL MULTIVAC 90 (MISCELLANEOUS) ×2 IMPLANT
WAND TENDON TOPAZ 0 ANGL (MISCELLANEOUS) IMPLANT
WAND TOPAZ EPF  WAS Q (MISCELLANEOUS)
WAND TOPAZ EPF WAS Q (MISCELLANEOUS) IMPLANT
WIRE MAGNUM (SUTURE) IMPLANT

## 2015-05-25 NOTE — Op Note (Signed)
05/25/2015  3:55 PM  Patient:   [name]  Pre-Op Diagnosis:   IMPINGEMENT SYNDROME right shoulder with rotator cuff tear and bicipital tendinosis  Postoperative diagnosis: Same plus glenohumeral arthritis   Procedure: Arthroscopic release of the long head of the biceps tendon plus subacromial decompression followed by mini incision rotator cuff repair  Anesthesia: General endotracheal with interscalene block placed preoperatively by the anesthesiologist.  Findings: As above.   Complications: None  Estimated blood loss: negligible  Tourniquet time: None  Drains: None   Brief clinical note:  The patient's symptoms have progressed despite medications, activity modification, etc. The patient's history and examination are consistent with rotator cuff tear with impingement. These findings were confirmed by MRI scan. The MRI also revealed significant long head of the biceps tendinosis. Patient presents at this time for definitive management of these shoulder symptoms.  Procedure: The patient had an interscalene block on the right shoulder in the preop area. The patient was then brought into the operating room and placed in the supine position. The patient then underwent  endotracheal intubation and general anesthesia before being repositioned in the lateral decubitus position. The right shoulder was prepped and draped in usual fashion for shoulder procedure. The shoulder was supported with the Acufex shoulder suspension device.  10 pounds of traction was utilized. Preoperative antibiotics were administered. A timeout was performed . A posterior portal was created and the glenohumeral joint thoroughly inspected revealing grade 2-3 glenohumeral arthritic changes along with a frayed long head of the biceps tendon plus a significant rotator cuff tear. An anterior portal was created. An ArthroCare wand was inserted and used to obtain hemostasis as well as to perform a  limited synovectomy.The biceps tendon was evaluated and then released from its labral attachment using an ArthroCare wand.  The scope was repositioned through the posterior portal into the subacromial space. A separate lateral portal was created using an outside-in technique. The rotator cuff was inspected revealing a moderate sized tear that primarily involved the supraspinatus. The tear was was about 1-1/2 to 2 cm in width and was retracted about a centimeter and a half. There was a moderate sized anterior-inferior acromial spur that projected into the subacromial space. An ArthroCare 90 wand followed by a 4.0 full-radius resector was introduced and used to perform a subtotal bursectomy. The ArthroCare wand was then inserted and used to remove the periosteal tissue off the undersurface of the anterior third of the acromion as well as to recess the coracoacromial ligament from its attachment along the anterior and lateral margins of the acromion.   With the scope in the lateral portal a 5.54mm acromionizing bur was introduced through the posterior portal and used to perform the decompression by removing the undersurface of the anterior third of the acromion. I also used a 5.5 mm round bur to lightly decorticate the greater tuberosity in the intended area of the rotator rotator cuff reattachment. I also used a small punch to make vascular vent holes in the greater tuberosity. The instruments were then removed from the subacromial space.  An approximately 4 cm incision was made over the midlateral aspect of the shoulder.   This incision was carried down through the subcutaneous tissues onto the deltoid. The deltoid was divided in line with its fibers to provide access into the subacromial space. The rotator cuff tear was readily identified. The margins were debrided.   The tear was repaired using horizontal mattress sutures secured to two Q- Fix anchors. The horizontal  mattress suture tails were then crisscrossed  over to 2 laterally placed 5.5 speed screws. This gave me a 2 row repair of the torn rotator cuff. The repaired cuff seemed to be  quite stable. I did place a #1 nonabsorbable suture into the posterior portion of the tear to close a residual dog-ear.   The wound was copiously irrigated with sterile saline solution before the deltoid was repaired to bone with #1 ethibond suture.  Deltoid interval was closed with 0 vicryl. The subcutaneous tissues were closed  using 2-0 Vicryl interrupted sutures and the skin incision was closed using staples. The portal sites also were closed using 3-O nylon sutures. A sterile bulky dressing was applied to the shoulder plus 4 TENS pads before the arm was placed into a shoulder immobilizer. The patient was then awakened, extubated, and returned to the recovery room in satisfactory condition after tolerating the procedure well.  Blood loss was negligible.

## 2015-05-25 NOTE — Transfer of Care (Signed)
Immediate Anesthesia Transfer of Care Note  Patient: [name]  Procedure(s) Performed: Procedure(s): SHOULDER ARTHROSCOPY WITH ROTATOR CUFF REPAIR AND SUBACROMIAL DECOMPRESSION, release long head biceps tendon (Right)  Patient Location: PACU  Anesthesia Type:General  Level of Consciousness: awake, alert , oriented and patient cooperative  Airway & Oxygen Therapy: Patient Spontanous Breathing and Patient connected to nasal cannula oxygen  Post-op Assessment: Report given to RN and Post -op Vital signs reviewed and stable  Post vital signs: Reviewed and stable  Last Vitals:  Filed Vitals:   05/25/15 1135 05/25/15 1522  BP: 158/60 142/71  Pulse: 57 60  Temp:  36.1 C  Resp:  14    Complications: No apparent anesthesia complications

## 2015-05-25 NOTE — Discharge Instructions (Signed)
Use shoulder immobilizer at all times ° °Keep dressing dry ° °Leave dressing in place until first postoperative visit ° °Return to the clinic about 1 week post surgery ° °Take 81 mg aspirin or Bufferin tablet twice a day for 2 weeks post surgery ° °Can sleep with multiple pillows behind the back or in a recliner ° °Use TENS unit if prescribed ° °Take pain medication prior to going to sleep the evening of your surgery ° °Ice pack prn ° °Activity as tolerated °

## 2015-05-25 NOTE — Anesthesia Postprocedure Evaluation (Signed)
Anesthesia Post Note  Patient: [name]  Procedure(s) Performed: Procedure(s) (LRB): SHOULDER ARTHROSCOPY WITH ROTATOR CUFF REPAIR AND SUBACROMIAL DECOMPRESSION (Right)  Patient location during evaluation: PACU Anesthesia Type: General Level of consciousness: awake and alert Pain management: pain level controlled Vital Signs Assessment: post-procedure vital signs reviewed and stable Respiratory status: spontaneous breathing, nonlabored ventilation, respiratory function stable and patient connected to nasal cannula oxygen Cardiovascular status: blood pressure returned to baseline and stable Postop Assessment: no signs of nausea or vomiting Anesthetic complications: no    Last Vitals:  Filed Vitals:   05/25/15 1057 05/25/15 1135  BP: 148/62 158/60  Pulse: 60 57  Temp: 36.1 C   Resp: 16     Last Pain:  Filed Vitals:   05/25/15 1136  PainSc: [name]

## 2015-05-25 NOTE — H&P (Signed)
  H and P reviewed. No changes. Uploaded at later date. 

## 2015-05-25 NOTE — Anesthesia Procedure Notes (Addendum)
Anesthesia Regional Block:  Interscalene brachial plexus block  Pre-Anesthetic Checklist: ,, timeout performed, Correct Patient, Correct Site, Correct Laterality, Correct Procedure, Correct Position, site marked, Risks and benefits discussed,  Surgical consent,  Pre-op evaluation,  At surgeon's request and post-op pain management  Laterality: Left  Prep: chloraprep       Needles:   Needle Type: Echogenic Stimulator Needle     Needle Length: 5cm 5 cm Needle Gauge: 20 and 20 G    Additional Needles:  Procedures: ultrasound guided (picture in chart) and nerve stimulator Interscalene brachial plexus block  Nerve Stimulator or Paresthesia:  Response: 80 mA, 3 cm  Additional Responses:   Narrative:  Injection made incrementally with aspirations every 5 mL.  Performed by: Personally  Anesthesiologist: ADAMS, Alvina Filbert  Additional Notes: After informed consent was obtained, the patient chose to proceed with an isnb for POP as requested by the surgeon.  IV sedation was obtained and the procedure was completed without difficulty.  Neg IVTD and no pain with injection.  The procedure was tolerated well with vsst. JA 30cc Ropivicaine with 1:200K epi.    Procedure Name: Intubation Date/Time: 05/25/2015 1:00 PM Performed by: Alda Berthold Pre-anesthesia Checklist: Patient identified, Patient being monitored, Timeout performed, Emergency Drugs available and Suction available Patient Re-evaluated:Patient Re-evaluated prior to inductionOxygen Delivery Method: Circle system utilized Preoxygenation: Pre-oxygenation with 100% oxygen Intubation Type: IV induction Ventilation: Mask ventilation without difficulty Laryngoscope Size: Mac and 3 Grade View: Grade II Tube type: Oral Tube size: 6.5 mm Number of attempts: 1 Placement Confirmation: ETT inserted through vocal cords under direct vision,  positive ETCO2 and breath sounds checked- equal and bilateral Secured at: 21 cm Tube secured  with: Tape Dental Injury: Teeth and Oropharynx as per pre-operative assessment

## 2015-05-25 NOTE — Progress Notes (Signed)
Went over instructions with family   States understanding   No pain at present

## 2015-05-25 NOTE — Anesthesia Preprocedure Evaluation (Signed)
Anesthesia Evaluation  Patient identified by MRN, date of birth, ID band Patient awake    Reviewed: Allergy & Precautions, H&P , NPO status , Patient's Chart, lab work & pertinent test results, reviewed documented beta blocker date and time   Airway Mallampati: II  TM Distance: >3 FB Neck ROM: full    Dental  (+) Teeth Intact   Pulmonary neg pulmonary ROS, Current Smoker,    Pulmonary exam normal        Cardiovascular hypertension, + Peripheral Vascular Disease  negative cardio ROS Normal cardiovascular exam Rhythm:regular Rate:Normal     Neuro/Psych negative neurological ROS  negative psych ROS   GI/Hepatic negative GI ROS, Neg liver ROS, PUD,   Endo/Other  negative endocrine ROS  Renal/GU negative Renal ROS  negative genitourinary   Musculoskeletal   Abdominal   Peds  Hematology negative hematology ROS (+) anemia ,   Anesthesia Other Findings   Reproductive/Obstetrics negative OB ROS                             Anesthesia Physical Anesthesia Plan  ASA: III  Anesthesia Plan: General ETT   Post-op Pain Management: MAC Combined w/ Regional for Post-op pain   Induction:   Airway Management Planned:   Additional Equipment:   Intra-op Plan:   Post-operative Plan:   Informed Consent: I have reviewed the patients History and Physical, chart, labs and discussed the procedure including the risks, benefits and alternatives for the proposed anesthesia with the patient or authorized representative who has indicated his/her understanding and acceptance.     Plan Discussed with: CRNA  Anesthesia Plan Comments:         Anesthesia Quick Evaluation

## 2015-05-26 ENCOUNTER — Encounter: Payer: Self-pay | Admitting: Unknown Physician Specialty

## 2015-05-26 LAB — TYPE AND SCREEN
ABO/RH(D): A POS
Antibody Screen: NEGATIVE
Unit division: 0

## 2015-06-15 ENCOUNTER — Inpatient Hospital Stay: Payer: Medicare Other

## 2015-06-15 ENCOUNTER — Inpatient Hospital Stay: Payer: Medicare Other | Attending: Internal Medicine | Admitting: Internal Medicine

## 2015-06-15 VITALS — BP 124/71 | HR 76 | Temp 96.3°F | Resp 18 | Ht 61.0 in | Wt 140.2 lb

## 2015-06-15 DIAGNOSIS — M545 Low back pain: Secondary | ICD-10-CM | POA: Insufficient documentation

## 2015-06-15 DIAGNOSIS — M5030 Other cervical disc degeneration, unspecified cervical region: Secondary | ICD-10-CM | POA: Insufficient documentation

## 2015-06-15 DIAGNOSIS — E785 Hyperlipidemia, unspecified: Secondary | ICD-10-CM | POA: Insufficient documentation

## 2015-06-15 DIAGNOSIS — K2960 Other gastritis without bleeding: Secondary | ICD-10-CM | POA: Diagnosis not present

## 2015-06-15 DIAGNOSIS — C679 Malignant neoplasm of bladder, unspecified: Secondary | ICD-10-CM | POA: Diagnosis not present

## 2015-06-15 DIAGNOSIS — E079 Disorder of thyroid, unspecified: Secondary | ICD-10-CM | POA: Insufficient documentation

## 2015-06-15 DIAGNOSIS — I10 Essential (primary) hypertension: Secondary | ICD-10-CM

## 2015-06-15 DIAGNOSIS — G8929 Other chronic pain: Secondary | ICD-10-CM | POA: Insufficient documentation

## 2015-06-15 DIAGNOSIS — M5136 Other intervertebral disc degeneration, lumbar region: Secondary | ICD-10-CM | POA: Diagnosis not present

## 2015-06-15 DIAGNOSIS — D649 Anemia, unspecified: Secondary | ICD-10-CM

## 2015-06-15 DIAGNOSIS — Z79899 Other long term (current) drug therapy: Secondary | ICD-10-CM | POA: Diagnosis not present

## 2015-06-15 DIAGNOSIS — R634 Abnormal weight loss: Secondary | ICD-10-CM | POA: Insufficient documentation

## 2015-06-15 DIAGNOSIS — M810 Age-related osteoporosis without current pathological fracture: Secondary | ICD-10-CM

## 2015-06-15 DIAGNOSIS — D509 Iron deficiency anemia, unspecified: Secondary | ICD-10-CM | POA: Insufficient documentation

## 2015-06-15 LAB — CBC WITH DIFFERENTIAL/PLATELET
Basophils Absolute: 0.1 10*3/uL (ref 0–0.1)
Basophils Relative: 1 %
EOS ABS: 0.1 10*3/uL (ref 0–0.7)
EOS PCT: 1 %
HCT: 27.6 % — ABNORMAL LOW (ref 35.0–47.0)
HEMOGLOBIN: 8.8 g/dL — AB (ref 12.0–16.0)
LYMPHS ABS: 1.5 10*3/uL (ref 1.0–3.6)
Lymphocytes Relative: 15 %
MCH: 26.1 pg (ref 26.0–34.0)
MCHC: 31.8 g/dL — AB (ref 32.0–36.0)
MCV: 82 fL (ref 80.0–100.0)
MONO ABS: 0.7 10*3/uL (ref 0.2–0.9)
MONOS PCT: 7 %
Neutro Abs: 7.6 10*3/uL — ABNORMAL HIGH (ref 1.4–6.5)
Neutrophils Relative %: 76 %
PLATELETS: 398 10*3/uL (ref 150–440)
RBC: 3.37 MIL/uL — ABNORMAL LOW (ref 3.80–5.20)
RDW: 18.9 % — AB (ref 11.5–14.5)
WBC: 9.9 10*3/uL (ref 3.6–11.0)

## 2015-06-15 LAB — SAMPLE TO BLOOD BANK

## 2015-06-15 LAB — FERRITIN: FERRITIN: 394 ng/mL — AB (ref 11–307)

## 2015-06-15 NOTE — Progress Notes (Signed)
[place]  [phone] [phone]     ID: [name] OB: Feb 07, 1933  MR#: [id number]  ENI#:[id number]  Patient Care Team: Kirk Ruths, MD as PCP - General (Internal Medicine) Hollice Espy, MD as Consulting Physician (Urology)  CHIEF COMPLAINT/DIAGNOSIS:  1. Symptomatic anemia March 2016, likely due to erosive gastritis causing iron deficiency - received IV Venofer 500 mg on 08/04/14 in-hospital, then started on oral iron therapy. Then got IV Venofer 500 mg x 2 doses Aug/Sept 2016.  EGD 08/05/14 - LA Grade A erosive esophagitis. Biopsied. Gastritis. Biopsied. Flattened mucosa was found in the duodenum, not consistent with celiac disease. Biopsied. Erosive gastritis.  2. Unintentional weight loss with workup March 2016 showing thyroid nodule and possible urinary bladder mass - underwent TURBT in May 2016 for high-grade superficial bladder cancer. Following with endocrinology for thyroid nodule.    HISTORY OF PRESENT ILLNESS:  [name] returns to our clinic for a follow-up visit. She has done well since her last appointment. She been able to perform all activities of daily living, with only limitation being persistent back pain, which she attributes to multiple prior spine surgeries. Her appetite is good, she denies any additional weight loss, nausea, vomiting, diarrhea, constipation, chest pain, shortness of breath. REVIEW OF SYSTEMS:   ROS As in HPI above.  PAST MEDICAL HISTORY: Reviewed. Past Medical History  Diagnosis Date  . Hypertension   . Anemia   . DDD (degenerative disc disease), cervical   . DDD (degenerative disc disease), lumbar   . Elevated lipids   . Osteoporosis   . Glaucoma   . Multiple gastric ulcers   . Foot pain   . Colon polyp   . Chronic low back pain   . Cancer [facility]) 2016    bladder tumor  . Carotid arterial disease ([place]) 01/09/2014    Overview:  Minimal on screening            Hypertension  Hyperlipidemia  Colon polyps s/p repeat colonoscopy in 03/2014, which was negative for polyps and showed internal hemorrhoids Cervical spondylosis   Chronic foot pain  Osteoporosis  Lumbar diskectomy  Hysterectomy decades ago  C-spine and back surgery, total of 6 back surgeries and spinal fusions.   Hospitalization March 2016 for  1. Symptomatic anemia, likely due to gastritis and duodenal ulcers causing iron deficiency. 2. Unintentional weight loss with workup showing thyroid nodule and possible urinary bladder mass.   PAST SURGICAL HISTORY: Reviewed. Past Surgical History  Procedure Laterality Date  . Back surgery      x 6  . Carpal tunnel release Right   . Wrist fracture surgery Left   . Abdominal hysterectomy    . Knee arthroscopy Right     x2  . Mandible surgery Bilateral     x2  . Cataract extraction Bilateral   . Transurethral resection of bladder tumor N/A 09/29/2014    Procedure: TRANSURETHRAL RESECTION OF BLADDER TUMOR (TURBT);  Surgeon: Hollice Espy, MD;  Location: [facility] ORS;  Service: Urology;  Laterality: N/A;  . Cystoscopy w/ retrogrades Bilateral 09/29/2014    Procedure: CYSTOSCOPY WITH RETROGRADE PYELOGRAM;  Surgeon: Hollice Espy, MD;  Location: [facility] ORS;  Service: Urology;  Laterality: Bilateral;  . Eye surgery    . Cystoscopy with biopsy N/A 11/02/2014    Procedure: CYSTOSCOPY WITH BIOPSY/WITH MITOMYCIN;  Surgeon: Hollice Espy, MD;  Location: [facility] ORS;  Service: Urology;  Laterality: N/A;  . Foot surgery Right   . Esophagogastroduodenoscopy (egd) with  propofol N/A 05/10/2015    Procedure: ESOPHAGOGASTRODUODENOSCOPY (EGD) WITH PROPOFOL;  Surgeon: Lollie Sails, MD;  Location: [facility] ENDOSCOPY;  Service: Endoscopy;  Laterality: N/A;  . Shoulder arthroscopy with rotator cuff repair and subacromial decompression Right 05/25/2015    Procedure: SHOULDER ARTHROSCOPY WITH ROTATOR CUFF REPAIR AND SUBACROMIAL DECOMPRESSION, release long head biceps tendon;   Surgeon: Leanor Kail, MD;  Location: [facility] ORS;  Service: Orthopedics;  Laterality: Right;    FAMILY HISTORY: Reviewed. Remarkable for heart disease, hypertension, emphysema and diabetes  SOCIAL HISTORY: Reviewed. Denies smoking, alcohol or recreational drug usage.  No Active Allergies  Current Outpatient Prescriptions  Medication Sig Dispense Refill  . acetaminophen (TYLENOL) 650 MG CR tablet Take 650 mg by mouth every 8 (eight) hours as needed for pain.    . Ascorbic Acid (VITAMIN C) 1000 MG tablet Take 1,000 mg by mouth daily.    . Calcium Carb-Ergocalciferol 250-125 MG-UNIT TABS Take 1 tablet by mouth.    . calcium-vitamin D (OSCAL WITH D) 500-200 MG-UNIT per tablet Take 2 tablets by mouth 2 (two) times daily.     . carvedilol (COREG) 25 MG tablet Take 25 mg by mouth 2 (two) times daily with a meal.    . diltiazem (CARDIZEM CD) 240 MG 24 hr capsule Take 240 mg by mouth daily.    . ferrous sulfate 325 (65 FE) MG tablet Take 325 mg by mouth daily with breakfast.    . latanoprost (XALATAN) 0.005 % ophthalmic solution Place 1 drop into both eyes at bedtime.    [name] oxyCODONE-acetaminophen (ROXICET) 5-325 MG tablet Take 1-2 tablets by mouth every 4 (four) hours as needed for moderate pain or severe pain. 30 tablet 0  . pantoprazole (PROTONIX) 40 MG tablet   0  . pravastatin (PRAVACHOL) 40 MG tablet Take 40 mg by mouth daily at 6 PM.    . raloxifene (EVISTA) 60 MG tablet Take 60 mg by mouth daily.     No current facility-administered medications for this visit.    PHYSICAL EXAM: Filed Vitals:   06/15/15 1133  BP: 124/71  Pulse: 76  Temp: 96.3 F (35.7 C)  Resp: 18     Body mass index is 26.51 kg/(m^2).      GENERAL: Patient is alert and oriented and in no acute distress. No icterus.   HEENT: EOMs intact. No neck masses or thyromegaly.  LUNGS: Bilaterally clear to auscultation, no rhonchi. ABDOMEN: Soft, nontender.    EXTREMITIES: No pedal edema.   LAB RESULTS: 01/21/15 -  serum iron 23, and saturation 90%, ferritin 91, TIBC 246, hemoglobin 7.7, MCV 76.3.  Lab Results  Component Value Date   WBC 9.9 06/15/2015   NEUTROABS 7.6* 06/15/2015   HGB 8.8* 06/15/2015   HCT 27.6* 06/15/2015   MCV 82.0 06/15/2015   PLT 398 06/15/2015   10/26/2014 - hemoglobin 8.6, WBC 8200, 76% neutrophils, platelets 370, serum iron low at 16, iron saturation low at 6%, ferritin 84.  09/29/14 - TURBT  Surgical Pathology Report.   DIAGNOSIS:   A. URINARY BLADDER, MASS; TURBT:  UROTHELIAL CARCINOMA, INVASIVE INTO LAMINA PROPRIA, HIGH GRADE, AND INVERTED.  FOCI OF FLAT CARCINOMA IN SITU ARE NOTED.  NO MUSCULARIS PROPRIA PRESENT.   B. URINARY BLADDER, TUMOR BASE; BIOPSY: NO MALIGNANCY SEEN.    STUDIES: 08/04/14 - CT scan of chest/abdomen/pelvis. 1.  No acute process in the chest. 2.  Atherosclerosis, including within the coronary arteries. 3. Tiny nonspecific posterior right upper lobe pulmonary nodule which can be  re-evaluated on follow-up. 4. Right thyroid nodule which warrants further evaluation with thyroid ultrasound. 1. Right bladder base nodule, highly suspicious for transitional cell carcinoma. These results will be called to the ordering clinician or representative by the Radiologist Assistant, and communication documented in the PACS or zVision Dashboard. 2. No evidence of metastatic disease within the abdomen or pelvis. 3. Postsurgical changes within the lumbar spine. Increased (since 05/18/2014) in nonspecific multiloculated postoperative fluid collection/collections.  08/05/14 - US Thyroid - IMPRESSION: Bilateral nodules. The dominant nodule in the lower pole of the right lobe measures 2.1 cm. Findings meet consensus criteria for biopsy. Ultrasound-guided fine needle aspiration should be considered.  ASSESSMENT / PLAN:   1.  Symptomatic anemia March 2016, likely due to erosive gastritis causing iron deficiency. Received IV Venofer 500 mg on 08/04/14 in-hospital, then started on oral  iron therapy. Although hemoglobin has improved with aggressive iron repletion, hemoglobin still remains below normal, which raises concerns about other causes of anemia and an 82 year old lady. Her total protein is normal, and albumin is only marginally low, so I doubt that she has an advanced form of plasma cell dyscrasia. Her hemoglobin has been stable, she does not have iron deficiency, and CBC is not consistent with vitamin B-12 deficiency. Likely, she has myelodysplastic syndrome, however I will not pursue bone marrow biopsy at this point, since the patient is not symptomatic, not transfusion dependent, as such, no aggressive treatment will be indicated even if she does have myelodysplastic syndrome. She will return to our clinic in 3 months, at that point we will recheck CBC.   2.  Unintentional weight loss with workup March 2016 showing thyroid nodule and possible urinary bladder mass - Patient following with urology, had TURBT which showed superficial bladder cancer, she is on a course of intravesical BCG therapy. She is followed by urology and have cystoscopy follow-up. She had EGD in December 2016, which showed gastritis (biopsy without evidence of malignancy).  3.  In between visits, patient advised to call or come to ER in case of any fevers, progressive symptoms or acute sickness. She is agreeable to this plan.    Roxana Hires, MD   06/15/2015 11:40 AM

## 2015-07-29 ENCOUNTER — Other Ambulatory Visit: Payer: Medicare Other | Admitting: Urology

## 2015-08-02 ENCOUNTER — Other Ambulatory Visit: Payer: Medicare Other | Admitting: Urology

## 2015-08-03 ENCOUNTER — Ambulatory Visit (INDEPENDENT_AMBULATORY_CARE_PROVIDER_SITE_OTHER): Payer: Medicare Other | Admitting: Urology

## 2015-08-03 ENCOUNTER — Encounter: Payer: Self-pay | Admitting: Urology

## 2015-08-03 VITALS — BP 134/60 | HR 72 | Ht 61.0 in

## 2015-08-03 DIAGNOSIS — C675 Malignant neoplasm of bladder neck: Secondary | ICD-10-CM

## 2015-08-03 LAB — URINALYSIS, COMPLETE
Bilirubin, UA: NEGATIVE
Glucose, UA: NEGATIVE
NITRITE UA: NEGATIVE
SPEC GRAV UA: 1.02 (ref 1.005–1.030)
Urobilinogen, Ur: 0.2 mg/dL (ref 0.2–1.0)
pH, UA: 5 (ref 5.0–7.5)

## 2015-08-03 LAB — MICROSCOPIC EXAMINATION: Epithelial Cells (non renal): >10 /HPF — AB

## 2015-08-03 MED ORDER — CIPROFLOXACIN HCL 500 MG PO TABS
500.0000 mg | ORAL_TABLET | Freq: Once | ORAL | Status: AC
  Administered 2015-08-03: 500 mg via ORAL

## 2015-08-03 MED ORDER — LIDOCAINE HCL 2 % EX GEL
1.0000 "application " | Freq: Once | CUTANEOUS | Status: AC
  Administered 2015-08-03: 1 via URETHRAL

## 2015-08-03 NOTE — Progress Notes (Signed)
10:18 AM  08/03/2015   [name] [birth year] [id number]  Referring provider: Kirk Ruths, MD [facility] [place], [place] [address]  Chief Complaint  Patient presents with  . Cysto    HPI: 82 year old nonsmoker with  incidental 11 mm papillary neoplasm near the RIGHT bladder neck. She was taken to the operating room on 09/29/2014 for TURBT, Bilateral retrograde pyelogram at which time a 2 cm extremely spherical mass was identified just proximal to the RIGHT UO. This was resected along with deeper biopsies of the base. Of note, bilateral retrograde pyelogram was negative for any obvious upper tract filling defects or hydronephrosis.  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 The tumor had an inverted architecture. Deeper biopsies did contain muscle after speaking with the pathologist today and the muscle was negative for any tumor. Additonally, CIS was noted.  She returned to the operating room on 11/02/2014 for restaging TURBT. Pathology showed no evidence of residual tumor.  She completed BCG x 6 for induction and first course of maintenence bcg x 3 doses 3 months ago which was well tolerated.    She returns today for surveillance cystoscopy.    PMH: Past Medical History  Diagnosis Date  . Hypertension   . Anemia   . DDD (degenerative disc disease), cervical   . DDD (degenerative disc disease), lumbar   . Elevated lipids   . Osteoporosis   . Glaucoma   . Multiple gastric ulcers   . Foot pain   . Colon polyp   . Chronic low back pain   . Cancer [facility]) 2016    bladder tumor  . Carotid arterial disease ([place]) 01/09/2014    Overview:  Minimal on screening     Surgical History: Past Surgical History  Procedure Laterality Date  . Back surgery      x 6  . Carpal tunnel release Right   . Wrist fracture surgery Left   . Abdominal hysterectomy    . Knee arthroscopy  Right     x2  . Mandible surgery Bilateral     x2  . Cataract extraction Bilateral   . Transurethral resection of bladder tumor N/A 09/29/2014    Procedure: TRANSURETHRAL RESECTION OF BLADDER TUMOR (TURBT);  Surgeon: Hollice Espy, MD;  Location: [facility] ORS;  Service: Urology;  Laterality: N/A;  . Cystoscopy w/ retrogrades Bilateral 09/29/2014    Procedure: CYSTOSCOPY WITH RETROGRADE PYELOGRAM;  Surgeon: Hollice Espy, MD;  Location: [facility] ORS;  Service: Urology;  Laterality: Bilateral;  . Eye surgery    . Cystoscopy with biopsy N/A 11/02/2014    Procedure: CYSTOSCOPY WITH BIOPSY/WITH MITOMYCIN;  Surgeon: Hollice Espy, MD;  Location: [facility] ORS;  Service: Urology;  Laterality: N/A;  . Foot surgery Right   . Esophagogastroduodenoscopy (egd) with propofol N/A 05/10/2015    Procedure: ESOPHAGOGASTRODUODENOSCOPY (EGD) WITH PROPOFOL;  Surgeon: Lollie Sails, MD;  Location: [facility] ENDOSCOPY;  Service: Endoscopy;  Laterality: N/A;  . Shoulder arthroscopy with rotator cuff repair and subacromial decompression Right 05/25/2015    Procedure: SHOULDER ARTHROSCOPY WITH ROTATOR CUFF REPAIR AND SUBACROMIAL DECOMPRESSION, release long head biceps tendon;  Surgeon: Leanor Kail, MD;  Location: [facility] ORS;  Service: Orthopedics;  Laterality: Right;    Home Medications:    Medication List       This list is accurate as of: 08/03/15 10:18 AM.  Always use your most recent med list.  acetaminophen 650 MG CR tablet  Commonly known as:  TYLENOL  Take 650 mg by mouth every 8 (eight) hours as needed for pain.     Calcium Carb-Ergocalciferol 250-125 MG-UNIT Tabs  Take 1 tablet by mouth.     calcium-vitamin D 500-200 MG-UNIT tablet  Commonly known as:  OSCAL WITH D  Take 2 tablets by mouth 2 (two) times daily.     carvedilol 25 MG tablet  Commonly known as:  COREG  Take 25 mg by mouth 2 (two) times daily with a meal.     diltiazem 240 MG 24 hr capsule  Commonly known as:  CARDIZEM CD  Take  240 mg by mouth daily.     ferrous sulfate 325 (65 FE) MG tablet  Take 325 mg by mouth daily with breakfast.     latanoprost 0.005 % ophthalmic solution  Commonly known as:  XALATAN  Place 1 drop into both eyes at bedtime.     pantoprazole 40 MG tablet  Commonly known as:  PROTONIX     pravastatin 40 MG tablet  Commonly known as:  PRAVACHOL  Take 40 mg by mouth daily at 6 PM.     raloxifene 60 MG tablet  Commonly known as:  EVISTA  Take 60 mg by mouth daily.     vitamin C 1000 MG tablet  Take 1,000 mg by mouth daily.        Allergies:  No Known Allergies  Family History: Family History  Problem Relation Age of Onset  . Heart attack Father   . Emphysema Father   . Diabetes type II Sister   . Heart disease Mother   . Hypertension Mother   . Kidney disease Neg Hx   . Bladder Cancer Neg Hx   . Breast cancer Neg Hx     Social History:  reports that she has never smoked. She has never used smokeless tobacco. She reports that she does not drink alcohol or use illicit drugs.   Physical Exam: BP 134/60 mmHg  Pulse 72  Ht 5\' 1"  (1.549 m)  Constitutional:  Alert and oriented, No acute distress. HEENT: [place] AT, moist mucus membranes.  Trachea midline, no masses. Cardiovascular: No clubbing, cyanosis, or edema. Respiratory: Normal respiratory effort, no increased work of breathing. GI: Abdomen is soft, nontender, nondistended, no abdominal masses  GU:  Normal external genitalia. Normal urethral meatus. Skin: No rashes, bruises or suspicious lesions. Neurologic: Grossly intact, no focal deficits, moving all 4 extremities. Psychiatric: Normal mood and affect.  Laboratory Data: Lab Results  Component Value Date   WBC 9.9 06/15/2015   HGB 8.8* 06/15/2015   HCT 27.6* 06/15/2015   MCV 82.0 06/15/2015   PLT 398 06/15/2015    Urinalysis UA reviewed, no evidence of infection  Pertinent Imaging: CT abd/ pelvis 08/04/14 CT ABDOMEN AND PELVIS IMPRESSION 1. Right bladder  base nodule, highly suspicious for transitional cell carcinoma. These results will be called to the ordering clinician or representative by the Radiologist Assistant, and communication documented in the PACS or zVision Dashboard. 2. No evidence of metastatic disease within the abdomen or pelvis. 3. Postsurgical changes within the lumbar spine. Increased (since 05/18/2014) in nonspecific multiloculated postoperative fluid    Cystoscopy Procedure Note  Patient identification was confirmed, informed consent was obtained, and patient was prepped using Betadine solution.  Lidocaine jelly was administered per urethral meatus.    Preoperative abx where received prior to procedure.    Procedure: - Flexible cystoscope introduced, without any  difficulty.   - Thorough search of the bladder revealed:    normal urethral meatus    normal urothelium    no stones    no ulcers     no tumors    no urethral polyps    no trabeculation  - Ureteral orifices were normal in position and appearance.  Post-Procedure: - Patient tolerated the procedure well  Assessment & Plan:  82 year old female with a history of high-grade T1, Tis of the right bladder neck dx 09/2014 followed by induction BCG x 6, and most recently  maintenance BCG completed 04/2015.  She returns today for surveillance cystoscopy.   1. Malignant neoplasm of bladder neck Cystoscopy today negative.  Recommend q3 month surveillance cystoscopy. Recommend continuation of BCG maintenance starting next week x 3 doses (3 months, 6 months, 12 months, 18 months, 24 months, 30 months and 36 months) - Urinalysis, Complete   Return for BCG x 3, then cysto in 3 months.  Hollice Espy, MD  [facility] [address], [place],  [address] [phone]

## 2015-08-12 ENCOUNTER — Ambulatory Visit (INDEPENDENT_AMBULATORY_CARE_PROVIDER_SITE_OTHER): Payer: Medicare Other | Admitting: Obstetrics and Gynecology

## 2015-08-12 ENCOUNTER — Encounter: Payer: Self-pay | Admitting: Obstetrics and Gynecology

## 2015-08-12 VITALS — BP 152/70 | HR 70 | Resp 16 | Ht 61.0 in | Wt 141.0 lb

## 2015-08-12 DIAGNOSIS — C675 Malignant neoplasm of bladder neck: Secondary | ICD-10-CM | POA: Diagnosis not present

## 2015-08-12 MED ORDER — BCG LIVE 81 MG/VIAL IS SUSR: 81.0000 mg | Freq: Once | INTRAVESICAL | Status: DC

## 2015-08-12 NOTE — Progress Notes (Signed)
BCG Bladder Instillation  BCG # 1 of 3  Due to Bladder Cancer patient is present today for a BCG treatment. Patient was cleaned and prepped in a sterile fashion with betadine and lidocaine 2% jelly was instilled into the urethra.  A 14FR catheter was inserted, urine return was noted 50 ml, urine was yellow in color.  44ml of reconstituted BCG was instilled into the bladder. The catheter was then removed. Patient tolerated well, no complications were noted  Preformed by: Herbert Moors, FNP

## 2015-08-12 NOTE — Progress Notes (Signed)
9:42 AM  08/12/2015   [name] March 15, 1933 [id number]  Referring provider: Kirk Ruths, MD [facility] [place], [place] [address]  No chief complaint on file.   HPI: 82 year old nonsmoker with  incidental 11 mm papillary neoplasm near the RIGHT bladder neck. She was taken to the operating room on 09/29/2014 for TURBT, Bilateral retrograde pyelogram at which time a 2 cm extremely spherical mass was identified just proximal to the RIGHT UO. This was resected along with deeper biopsies of the base. Of note, bilateral retrograde pyelogram was negative for any obvious upper tract filling defects or hydronephrosis.  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 The tumor had an inverted architecture. Deeper biopsies did contain muscle after speaking with the pathologist today and the muscle was negative for any tumor. Additionally, CIS was noted.  She returned to the operating room on 11/02/2014 for restaging TURBT. Pathology showed no evidence of residual tumor.  She completed BCG x 6 for induction and first course of maintenance bcg x 3 doses 3 months ago which was well tolerated.    Interval History 08/12/15 Patient presents today for installation 1 of 3 of her maintenance BCG intravesicular therapy.  She is feeling well. No changes in urinary symptoms.   PMH: Past Medical History  Diagnosis Date  . Hypertension   . Anemia   . DDD (degenerative disc disease), cervical   . DDD (degenerative disc disease), lumbar   . Elevated lipids   . Osteoporosis   . Glaucoma   . Multiple gastric ulcers   . Foot pain   . Colon polyp   . Chronic low back pain   . Cancer [facility]) 2016    bladder tumor  . Carotid arterial disease ([place]) 01/09/2014    Overview:  Minimal on screening     Surgical History: Past Surgical History  Procedure Laterality Date  . Back surgery      x 6  . Carpal  tunnel release Right   . Wrist fracture surgery Left   . Abdominal hysterectomy    . Knee arthroscopy Right     x2  . Mandible surgery Bilateral     x2  . Cataract extraction Bilateral   . Transurethral resection of bladder tumor N/A 09/29/2014    Procedure: TRANSURETHRAL RESECTION OF BLADDER TUMOR (TURBT);  Surgeon: Hollice Espy, MD;  Location: [facility] ORS;  Service: Urology;  Laterality: N/A;  . Cystoscopy w/ retrogrades Bilateral 09/29/2014    Procedure: CYSTOSCOPY WITH RETROGRADE PYELOGRAM;  Surgeon: Hollice Espy, MD;  Location: [facility] ORS;  Service: Urology;  Laterality: Bilateral;  . Eye surgery    . Cystoscopy with biopsy N/A 11/02/2014    Procedure: CYSTOSCOPY WITH BIOPSY/WITH MITOMYCIN;  Surgeon: Hollice Espy, MD;  Location: [facility] ORS;  Service: Urology;  Laterality: N/A;  . Foot surgery Right   . Esophagogastroduodenoscopy (egd) with propofol N/A 05/10/2015    Procedure: ESOPHAGOGASTRODUODENOSCOPY (EGD) WITH PROPOFOL;  Surgeon: Lollie Sails, MD;  Location: [facility] ENDOSCOPY;  Service: Endoscopy;  Laterality: N/A;  . Shoulder arthroscopy with rotator cuff repair and subacromial decompression Right 05/25/2015    Procedure: SHOULDER ARTHROSCOPY WITH ROTATOR CUFF REPAIR AND SUBACROMIAL DECOMPRESSION, release long head biceps tendon;  Surgeon: Leanor Kail, MD;  Location: [facility] ORS;  Service: Orthopedics;  Laterality: Right;    Home Medications:    Medication List       This list is accurate as of: 08/12/15  9:42 AM.  Always use your most recent med list.               acetaminophen 650 MG CR tablet  Commonly known as:  TYLENOL  Take 650 mg by mouth every 8 (eight) hours as needed for pain.     Calcium Carb-Ergocalciferol 250-125 MG-UNIT Tabs  Take 1 tablet by mouth.     calcium-vitamin D 500-200 MG-UNIT tablet  Commonly known as:  OSCAL WITH D  Take 2 tablets by mouth 2 (two) times daily.     carvedilol 25 MG tablet  Commonly known as:  COREG  Take 25 mg by mouth 2  (two) times daily with a meal.     diltiazem 240 MG 24 hr capsule  Commonly known as:  CARDIZEM CD  Take 240 mg by mouth daily.     ferrous sulfate 325 (65 FE) MG tablet  Take 325 mg by mouth daily with breakfast.     latanoprost 0.005 % ophthalmic solution  Commonly known as:  XALATAN  Place 1 drop into both eyes at bedtime.     pantoprazole 40 MG tablet  Commonly known as:  PROTONIX     pravastatin 40 MG tablet  Commonly known as:  PRAVACHOL  Take 40 mg by mouth daily at 6 PM.     raloxifene 60 MG tablet  Commonly known as:  EVISTA  Take 60 mg by mouth daily.     vitamin C 1000 MG tablet  Take 1,000 mg by mouth daily.        Allergies:  No Known Allergies  Family History: Family History  Problem Relation Age of Onset  . Heart attack Father   . Emphysema Father   . Diabetes type II Sister   . Heart disease Mother   . Hypertension Mother   . Kidney disease Neg Hx   . Bladder Cancer Neg Hx   . Breast cancer Neg Hx     Social History:  reports that she has never smoked. She has never used smokeless tobacco. She reports that she does not drink alcohol or use illicit drugs.   Physical Exam: There were no vitals taken for this visit.  Constitutional:  Alert and oriented, No acute distress. HEENT: [place] AT, moist mucus membranes.  Trachea midline, no masses. Cardiovascular: No clubbing, cyanosis, or edema. Respiratory: Normal respiratory effort, no increased work of breathing. GI: Abdomen is soft, nontender, nondistended, no abdominal masses  GU:  Normal external genitalia. Normal urethral meatus. Skin: No rashes, bruises or suspicious lesions. Neurologic: Grossly intact, no focal deficits, moving all 4 extremities. Psychiatric: Normal mood and affect.  Laboratory Data: Lab Results  Component Value Date   WBC 9.9 06/15/2015   HGB 8.8* 06/15/2015   HCT 27.6* 06/15/2015   MCV 82.0 06/15/2015   PLT 398 06/15/2015    Urinalysis UA reviewed, no evidence of  infection  Pertinent Imaging: CT abd/ pelvis 08/04/14 CT ABDOMEN AND PELVIS IMPRESSION 1. Right bladder base nodule, highly suspicious for transitional cell carcinoma. These results will be called to the ordering clinician or representative by the Radiologist Assistant, and communication documented in the PACS or zVision Dashboard. 2. No evidence of metastatic disease within the abdomen or pelvis. 3. Postsurgical changes within the lumbar spine. Increased (since 05/18/2014) in nonspecific multiloculated postoperative fluid    Cystoscopy Procedure Note  Patient identification was confirmed, informed consent was obtained, and patient was prepped using Betadine solution.  Lidocaine jelly was administered per urethral meatus.  Preoperative abx where received prior to procedure.    Procedure: - Flexible cystoscope introduced, without any difficulty.   - Thorough search of the bladder revealed:    normal urethral meatus    normal urothelium    no stones    no ulcers     no tumors    no urethral polyps    no trabeculation  - Ureteral orifices were normal in position and appearance.  Post-Procedure: - Patient tolerated the procedure well  Assessment & Plan:  82 year old female with a history of high-grade T1, Tis of the right bladder neck dx 09/2014 followed by induction BCG x 6, and most recently  maintenance BCG completed 04/2015.  She returns today for surveillance cystoscopy.   1. Malignant neoplasm of bladder neck Cystoscopy today negative.  Recommend q3 month surveillance cystoscopy. Recommend continuation of BCG maintenance starting next week x 3 doses (3 months, 6 months, 12 months, 18 months, 24 months, 30 months and 36 months) - Urinalysis, Complete   No Follow-up on file.  Herbert Moors, [facility] [address] [place], [place] [address] [phone]

## 2015-08-13 LAB — URINALYSIS, COMPLETE
Bilirubin, UA: NEGATIVE
Glucose, UA: NEGATIVE
KETONES UA: NEGATIVE
NITRITE UA: NEGATIVE
PH UA: 5.5 (ref 5.0–7.5)
SPEC GRAV UA: 1.02 (ref 1.005–1.030)
Urobilinogen, Ur: 0.2 mg/dL (ref 0.2–1.0)

## 2015-08-13 LAB — MICROSCOPIC EXAMINATION: Epithelial Cells (non renal): >10 /hpf — AB (ref 0–10)

## 2015-08-19 ENCOUNTER — Encounter: Payer: Self-pay | Admitting: Urology

## 2015-08-19 ENCOUNTER — Ambulatory Visit (INDEPENDENT_AMBULATORY_CARE_PROVIDER_SITE_OTHER): Payer: Medicare Other | Admitting: Urology

## 2015-08-19 VITALS — BP 151/80 | HR 72 | Ht 61.0 in | Wt 141.5 lb

## 2015-08-19 DIAGNOSIS — C679 Malignant neoplasm of bladder, unspecified: Secondary | ICD-10-CM

## 2015-08-19 LAB — MICROSCOPIC EXAMINATION

## 2015-08-19 LAB — URINALYSIS, COMPLETE
Bilirubin, UA: NEGATIVE
Glucose, UA: NEGATIVE
NITRITE UA: NEGATIVE
SPEC GRAV UA: 1.015 (ref 1.005–1.030)
Urobilinogen, Ur: 0.2 mg/dL (ref 0.2–1.0)
pH, UA: 5.5 (ref 5.0–7.5)

## 2015-08-19 MED ORDER — SODIUM CHLORIDE 0.9% FLUSH: 50.0000 mL | Freq: Once | INTRAVENOUS | Status: DC

## 2015-08-19 MED ORDER — LIDOCAINE HCL 2 % EX GEL
1.0000 "application " | Freq: Once | CUTANEOUS | Status: AC
  Administered 2015-08-19: 1 via URETHRAL

## 2015-08-19 MED ORDER — BCG LIVE 50 MG IS SUSR
3.2400 mL | Freq: Once | INTRAVESICAL | Status: AC
  Administered 2015-08-19: 81 mg via INTRAVESICAL

## 2015-08-19 NOTE — Progress Notes (Signed)
BCG Bladder Instillation  BCG # 2 of 3  Due to Bladder Cancer patient is present today for a BCG treatment. Patient was cleaned and prepped in a sterile fashion with betadine and lidocaine 2% jelly was instilled into the urethra.  A 14FR catheter was inserted, urine return was noted 80ml, urine was clear and dark yellow in color.  77ml of reconstituted BCG was instilled into the bladder. The catheter was then removed. Patient tolerated well, no complications were noted  Preformed by: Zara Council PA-C and Lyndee Hensen CMA  Follow up/ Additional notes: One week

## 2015-08-19 NOTE — Progress Notes (Signed)
08/19/2015 9:31 AM   [name] 05/02/1933 [id number]  Referring provider: Kirk Ruths, MD [place] [facility] [place], [place] [address]  Chief Complaint  Patient presents with  . Bladder Cancer    BCG    HPI: Patient is a 82 year old Caucasian female who presents today for installation #2 of 3 of her maintenance BCG intravascular therapy.  Background history incidental 11 mm papillary neoplasm near the RIGHT bladder neck. She was taken to the operating room on 09/29/2014 for TURBT, Bilateral retrograde pyelogram at which time a 2 cm extremely spherical mass was identified just proximal to the RIGHT UO. This was resected along with deeper biopsies of the base. Of note, bilateral retrograde pyelogram was negative for any obvious upper tract filling defects or hydronephrosis.  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 The tumor had an inverted architecture. Deeper biopsies did contain muscle after speaking with the pathologist today and the muscle was negative for any tumor. Additionally, CIS was noted.  She returned to the operating room on 11/02/2014 for restaging TURBT. Pathology showed no evidence of residual tumor.  She completed BCG x 6 for induction and first course of maintenance bcg x 3 doses 3 months ago which was well tolerated.  Surveillance cystoscopy completed on 08/03/2015 did not note any recurrence.  After her last BCG installment,  she did not experience any complications.  She is currently not endorsing gross hematuria, cough, fevers, chills, nausea or vomiting.  She is not experiencing any dysuria or suprapubic pain at this time.  PMH: Past Medical History  Diagnosis Date  . Hypertension   . Anemia   . DDD (degenerative disc disease), cervical   . DDD (degenerative disc disease), lumbar   . Elevated lipids   . Osteoporosis   . Glaucoma   . Multiple gastric ulcers     . Foot pain   . Colon polyp   . Chronic low back pain   . Cancer [facility]) 2016    bladder tumor  . Carotid arterial disease ([place]) 01/09/2014    Overview:  Minimal on screening     Surgical History: Past Surgical History  Procedure Laterality Date  . Back surgery      x 6  . Carpal tunnel release Right   . Wrist fracture surgery Left   . Abdominal hysterectomy    . Knee arthroscopy Right     x2  . Mandible surgery Bilateral     x2  . Cataract extraction Bilateral   . Transurethral resection of bladder tumor N/A 09/29/2014    Procedure: TRANSURETHRAL RESECTION OF BLADDER TUMOR (TURBT);  Surgeon: Hollice Espy, MD;  Location: [facility] ORS;  Service: Urology;  Laterality: N/A;  . Cystoscopy w/ retrogrades Bilateral 09/29/2014    Procedure: CYSTOSCOPY WITH RETROGRADE PYELOGRAM;  Surgeon: Hollice Espy, MD;  Location: [facility] ORS;  Service: Urology;  Laterality: Bilateral;  . Eye surgery    . Cystoscopy with biopsy N/A 11/02/2014    Procedure: CYSTOSCOPY WITH BIOPSY/WITH MITOMYCIN;  Surgeon: Hollice Espy, MD;  Location: [facility] ORS;  Service: Urology;  Laterality: N/A;  . Foot surgery Right   . Esophagogastroduodenoscopy (egd) with propofol N/A 05/10/2015    Procedure: ESOPHAGOGASTRODUODENOSCOPY (EGD) WITH PROPOFOL;  Surgeon: Lollie Sails, MD;  Location: [facility] ENDOSCOPY;  Service: Endoscopy;  Laterality: N/A;  . Shoulder arthroscopy with rotator cuff repair and subacromial decompression Right 05/25/2015    Procedure: SHOULDER ARTHROSCOPY WITH ROTATOR  CUFF REPAIR AND SUBACROMIAL DECOMPRESSION, release long head biceps tendon;  Surgeon: Leanor Kail, MD;  Location: [facility] ORS;  Service: Orthopedics;  Laterality: Right;    Home Medications:    Medication List       This list is accurate as of: 08/19/15  9:31 AM.  Always use your most recent med list.               acetaminophen 650 MG CR tablet  Commonly known as:  TYLENOL  Take 650 mg by mouth every 8 (eight) hours as needed for pain.  Reported on 08/19/2015     Calcium Carb-Ergocalciferol 250-125 MG-UNIT Tabs  Take 1 tablet by mouth.     calcium-vitamin D 500-200 MG-UNIT tablet  Commonly known as:  OSCAL WITH D  Take 2 tablets by mouth 2 (two) times daily.     carvedilol 25 MG tablet  Commonly known as:  COREG  Take 25 mg by mouth 2 (two) times daily with a meal.     diltiazem 240 MG 24 hr capsule  Commonly known as:  CARDIZEM CD  Take 240 mg by mouth daily.     ferrous sulfate 325 (65 FE) MG tablet  Take 325 mg by mouth daily with breakfast.     latanoprost 0.005 % ophthalmic solution  Commonly known as:  XALATAN  Place 1 drop into both eyes at bedtime.     pantoprazole 40 MG tablet  Commonly known as:  PROTONIX     pravastatin 40 MG tablet  Commonly known as:  PRAVACHOL  Take 40 mg by mouth daily at 6 PM.     raloxifene 60 MG tablet  Commonly known as:  EVISTA  Take 60 mg by mouth daily.     ROXICET 5-325 MG tablet  Generic drug:  oxyCODONE-acetaminophen  Take by mouth. Reported on 08/19/2015     torsemide 20 MG tablet  Commonly known as:  DEMADEX  Reported on 08/19/2015     vitamin C 1000 MG tablet  Take 1,000 mg by mouth daily.        Allergies: No Known Allergies  Family History: Family History  Problem Relation Age of Onset  . Heart attack Father   . Emphysema Father   . Diabetes type II Sister   . Heart disease Mother   . Hypertension Mother   . Kidney disease Neg Hx   . Bladder Cancer Neg Hx   . Breast cancer Neg Hx     Social History:  reports that she has never smoked. She has never used smokeless tobacco. She reports that she does not drink alcohol or use illicit drugs.  ROS: UROLOGY Frequent Urination?: No Hard to postpone urination?: No Burning/pain with urination?: No Get up at night to urinate?: No Leakage of urine?: No Urine stream starts and stops?: No Trouble starting stream?: No Do you have to strain to urinate?: No Blood in urine?: No Urinary tract  infection?: No Sexually transmitted disease?: No Injury to kidneys or bladder?: No Painful intercourse?: No Weak stream?: No Currently pregnant?: No Vaginal bleeding?: No Last menstrual period?: n  Gastrointestinal Nausea?: No Vomiting?: No Indigestion/heartburn?: No Diarrhea?: No Constipation?: No  Constitutional Fever: No Night sweats?: No Weight loss?: No Fatigue?: No  Skin Skin rash/lesions?: No Itching?: No  Eyes Blurred vision?: No Double vision?: No  Ears/Nose/Throat Sore throat?: No Sinus problems?: No  Hematologic/Lymphatic Swollen glands?: No Easy bruising?: No  Cardiovascular Leg swelling?: No Chest pain?: No  Respiratory Cough?: No  Shortness of breath?: No  Endocrine Excessive thirst?: No  Musculoskeletal Back pain?: No Joint pain?: No  Neurological Headaches?: No Dizziness?: No  Psychologic Depression?: No Anxiety?: No  Physical Exam: BP 151/80 mmHg  Pulse 72  Ht 5\' 1"  (1.549 m)  Wt 141 lb 8 oz (64.184 kg)  BMI 26.75 kg/m2  Constitutional: Well nourished. Alert and oriented, No acute distress. HEENT: [place] AT, moist mucus membranes. Trachea midline, no masses. Cardiovascular: No clubbing, cyanosis, or edema. Respiratory: Normal respiratory effort, no increased work of breathing. GI: Abdomen is soft, non tender, non distended, no abdominal masses.  Skin: No rashes, bruises or suspicious lesions. Lymph: No cervical or inguinal adenopathy. Neurologic: Grossly intact, no focal deficits, moving all 4 extremities. Psychiatric: Normal mood and affect.  Laboratory Data: Lab Results  Component Value Date   WBC 9.9 06/15/2015   HGB 8.8* 06/15/2015   HCT 27.6* 06/15/2015   MCV 82.0 06/15/2015   PLT 398 06/15/2015    Lab Results  Component Value Date   CREATININE 0.66 05/18/2015     Urinalysis Results for orders placed or performed in visit on 08/19/15  Microscopic Examination  Result Value Ref Range   WBC, UA 6-10 (A) 0  -  5 /hpf   RBC, UA 3-10 (A) 0 -  2 /hpf   Epithelial Cells (non renal) 0-10 0 - 10 /hpf   Renal Epithel, UA 0-10 (A) None seen /hpf   Mucus, UA Present (A) Not Estab.   Bacteria, UA Few (A) None seen/Few  Urinalysis, Complete  Result Value Ref Range   Specific Gravity, UA 1.015 1.005 - 1.030   pH, UA 5.5 5.0 - 7.5   Color, UA Yellow Yellow   Appearance Ur Cloudy (A) Clear   Leukocytes, UA 1+ (A) Negative   Protein, UA 1+ (A) Negative/Trace   Glucose, UA Negative Negative   Ketones, UA Trace (A) Negative   RBC, UA 2+ (A) Negative   Bilirubin, UA Negative Negative   Urobilinogen, Ur 0.2 0.2 - 1.0 mg/dL   Nitrite, UA Negative Negative   Microscopic Examination See below:      Assessment & Plan:    1. Malignant neoplasm of urinary bladder, unspecified site [facility]):   History of high-grade T1, Tis of the right bladder neck dx 09/2014 followed by induction BCG x 6, and most recently maintenance BCG completed 04/2015. 2nd installment of maintenance BCG was given today.  Recommend continuation of BCG maintenance x 3 doses (3 months, 6 months, 12 months, 18 months, 24 months, 30 months and 36 months)  Return for BCG # 3 in one week, then cysto in 3 months   - Urinalysis, Complete   Return in about 1 week (around 08/26/2015) for # 3 BCG.  These notes generated with voice recognition software. I apologize for typographical errors.  Zara Council, [facility] [address], [place], [place] [address] [phone]

## 2015-08-22 ENCOUNTER — Other Ambulatory Visit: Payer: Self-pay | Admitting: Internal Medicine

## 2015-08-23 ENCOUNTER — Encounter: Payer: Self-pay | Admitting: *Deleted

## 2015-08-23 DIAGNOSIS — C679 Malignant neoplasm of bladder, unspecified: Secondary | ICD-10-CM | POA: Insufficient documentation

## 2015-08-23 DIAGNOSIS — Z9889 Other specified postprocedural states: Secondary | ICD-10-CM

## 2015-08-23 DIAGNOSIS — Z96651 Presence of right artificial knee joint: Secondary | ICD-10-CM | POA: Insufficient documentation

## 2015-08-26 ENCOUNTER — Ambulatory Visit (INDEPENDENT_AMBULATORY_CARE_PROVIDER_SITE_OTHER): Payer: Medicare Other | Admitting: Urology

## 2015-08-26 ENCOUNTER — Encounter: Payer: Self-pay | Admitting: Urology

## 2015-08-26 VITALS — BP 124/62 | HR 69 | Ht 61.0 in | Wt 137.7 lb

## 2015-08-26 DIAGNOSIS — C679 Malignant neoplasm of bladder, unspecified: Secondary | ICD-10-CM | POA: Diagnosis not present

## 2015-08-26 LAB — MICROSCOPIC EXAMINATION
RBC MICROSCOPIC, UA: NONE SEEN /HPF (ref 0–?)
WBC, UA: >30 /hpf — ABNORMAL HIGH (ref 0–?)

## 2015-08-26 LAB — URINALYSIS, COMPLETE
BILIRUBIN UA: NEGATIVE
Glucose, UA: NEGATIVE
NITRITE UA: NEGATIVE
PH UA: 5.5 (ref 5.0–7.5)
SPEC GRAV UA: 1.02 (ref 1.005–1.030)
UUROB: 1 mg/dL (ref 0.2–1.0)

## 2015-08-26 NOTE — Progress Notes (Signed)
10:12 AM   [name] 15-Mar-1933 [id number]  Referring provider: Kirk Ruths, MD [place], [place] [address]  Chief Complaint  Patient presents with  . Bladder Cancer    BCG 3 maint    HPI: Patient is a 82 year old Caucasian female who presents today for installation #3 of 3 of her 6 month maintenance BCG intravascular therapy.  Background history incidental 11 mm papillary neoplasm near the RIGHT bladder neck. She was taken to the operating room on 09/29/2014 for TURBT, Bilateral retrograde pyelogram at which time a 2 cm extremely spherical mass was identified just proximal to the RIGHT UO. This was resected along with deeper biopsies of the base. Of note, bilateral retrograde pyelogram was negative for any obvious upper tract filling defects or hydronephrosis.  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 The tumor had an inverted architecture. Deeper biopsies did contain muscle after speaking with the pathologist today and the muscle was negative for any tumor. Additionally, CIS was noted.  She returned to the operating room on 11/02/2014 for restaging TURBT. Pathology showed no evidence of residual tumor.  She completed BCG x 6 for induction and first course of maintenance bcg x 3 doses 3 months ago which was well tolerated.  Surveillance cystoscopy completed on 08/03/2015 did not note any recurrence.  After her last BCG installment,  she did not experience any complications.  She is currently not endorsing gross hematuria, cough, fevers, chills, nausea or vomiting.  She is not experiencing any dysuria or suprapubic pain at this time.    PMH: Past Medical History  Diagnosis Date  . Hypertension   . Anemia   . DDD (degenerative disc disease), cervical   . DDD (degenerative disc disease), lumbar   . Elevated lipids   . Osteoporosis   . Glaucoma   . Multiple gastric  ulcers   . Foot pain   . Colon polyp   . Chronic low back pain   . Cancer [facility]) 2016    bladder tumor  . Carotid arterial disease ([place]) 01/09/2014    Overview:  Minimal on screening     Surgical History: Past Surgical History  Procedure Laterality Date  . Back surgery      x 6  . Carpal tunnel release Right   . Wrist fracture surgery Left   . Abdominal hysterectomy    . Knee arthroscopy Right     x2  . Mandible surgery Bilateral     x2  . Cataract extraction Bilateral   . Transurethral resection of bladder tumor N/A 09/29/2014    Procedure: TRANSURETHRAL RESECTION OF BLADDER TUMOR (TURBT);  Surgeon: Hollice Espy, MD;  Location: [facility] ORS;  Service: Urology;  Laterality: N/A;  . Cystoscopy w/ retrogrades Bilateral 09/29/2014    Procedure: CYSTOSCOPY WITH RETROGRADE PYELOGRAM;  Surgeon: Hollice Espy, MD;  Location: [facility] ORS;  Service: Urology;  Laterality: Bilateral;  . Eye surgery    . Cystoscopy with biopsy N/A 11/02/2014    Procedure: CYSTOSCOPY WITH BIOPSY/WITH MITOMYCIN;  Surgeon: Hollice Espy, MD;  Location: [facility] ORS;  Service: Urology;  Laterality: N/A;  . Foot surgery Right   . Esophagogastroduodenoscopy (egd) with propofol N/A 05/10/2015    Procedure: ESOPHAGOGASTRODUODENOSCOPY (EGD) WITH PROPOFOL;  Surgeon: Lollie Sails, MD;  Location: [facility] ENDOSCOPY;  Service: Endoscopy;  Laterality: N/A;  . Shoulder arthroscopy with rotator cuff repair and subacromial decompression Right 05/25/2015  Procedure: SHOULDER ARTHROSCOPY WITH ROTATOR CUFF REPAIR AND SUBACROMIAL DECOMPRESSION, release long head biceps tendon;  Surgeon: Leanor Kail, MD;  Location: [facility] ORS;  Service: Orthopedics;  Laterality: Right;    Home Medications:    Medication List       This list is accurate as of: 08/26/15 10:12 AM.  Always use your most recent med list.               acetaminophen 650 MG CR tablet  Commonly known as:  TYLENOL  Take 650 mg by mouth every 8 (eight) hours as needed  for pain. Reported on 08/26/2015     Calcium Carb-Ergocalciferol 250-125 MG-UNIT Tabs  Take 1 tablet by mouth.     calcium-vitamin D 500-200 MG-UNIT tablet  Commonly known as:  OSCAL WITH D  Take 2 tablets by mouth 2 (two) times daily.     carvedilol 25 MG tablet  Commonly known as:  COREG  Take 25 mg by mouth 2 (two) times daily with a meal.     diltiazem 240 MG 24 hr capsule  Commonly known as:  CARDIZEM CD  Take 240 mg by mouth daily.     ferrous sulfate 325 (65 FE) MG tablet  Take 325 mg by mouth daily with breakfast.     latanoprost 0.005 % ophthalmic solution  Commonly known as:  XALATAN  Place 1 drop into both eyes at bedtime.     pantoprazole 40 MG tablet  Commonly known as:  PROTONIX     pravastatin 40 MG tablet  Commonly known as:  PRAVACHOL  Take 40 mg by mouth daily at 6 PM.     raloxifene 60 MG tablet  Commonly known as:  EVISTA  Take 60 mg by mouth daily.     ROXICET 5-325 MG tablet  Generic drug:  oxyCODONE-acetaminophen  Take by mouth. Reported on 08/26/2015     torsemide 20 MG tablet  Commonly known as:  DEMADEX  Reported on 08/26/2015     vitamin C 1000 MG tablet  Take 1,000 mg by mouth daily.        Allergies: No Known Allergies  Family History: Family History  Problem Relation Age of Onset  . Heart attack Father   . Emphysema Father   . Diabetes type II Sister   . Heart disease Mother   . Hypertension Mother   . Kidney disease Neg Hx   . Bladder Cancer Neg Hx   . Breast cancer Neg Hx     Social History:  reports that she has never smoked. She has never used smokeless tobacco. She reports that she does not drink alcohol or use illicit drugs.  ROS: UROLOGY Frequent Urination?: No Hard to postpone urination?: No Burning/pain with urination?: No Get up at night to urinate?: No Leakage of urine?: No Urine stream starts and stops?: No Trouble starting stream?: No Do you have to strain to urinate?: No Blood in urine?: No Urinary  tract infection?: No Sexually transmitted disease?: No Injury to kidneys or bladder?: No Painful intercourse?: No Weak stream?: No Currently pregnant?: No Vaginal bleeding?: No Last menstrual period?: n  Gastrointestinal Nausea?: No Vomiting?: No Indigestion/heartburn?: No Diarrhea?: No Constipation?: No  Constitutional Fever: No Night sweats?: No Weight loss?: No Fatigue?: No  Skin Skin rash/lesions?: No Itching?: No  Eyes Blurred vision?: No Double vision?: No  Ears/Nose/Throat Sore throat?: No Sinus problems?: No  Hematologic/Lymphatic Swollen glands?: No Easy bruising?: No  Cardiovascular Leg swelling?: No Chest pain?: No  Respiratory Cough?: No Shortness of breath?: No  Endocrine Excessive thirst?: No  Musculoskeletal Back pain?: No Joint pain?: No  Neurological Headaches?: No Dizziness?: No  Psychologic Depression?: No Anxiety?: No  Physical Exam: BP 124/62 mmHg  Pulse 69  Ht 5\' 1"  (1.549 m)  Wt 137 lb 11.2 oz (62.46 kg)  BMI 26.03 kg/m2  Constitutional: Well nourished. Alert and oriented, No acute distress. HEENT: [place] AT, moist mucus membranes. Trachea midline, no masses. Cardiovascular: No clubbing, cyanosis, or edema. Respiratory: Normal respiratory effort, no increased work of breathing. GI: Abdomen is soft, non tender, non distended, no abdominal masses.  Skin: No rashes, bruises or suspicious lesions. Lymph: No cervical or inguinal adenopathy. Neurologic: Grossly intact, no focal deficits, moving all 4 extremities. Psychiatric: Normal mood and affect.  Laboratory Data: Lab Results  Component Value Date   WBC 9.9 06/15/2015   HGB 8.8* 06/15/2015   HCT 27.6* 06/15/2015   MCV 82.0 06/15/2015   PLT 398 06/15/2015    Lab Results  Component Value Date   CREATININE 0.66 05/18/2015     Urinalysis Results for orders placed or performed in visit on 08/26/15  Microscopic Examination  Result Value Ref Range   WBC, UA  >30 (H) 0 -  5 /hpf   RBC, UA None seen 0 -  2 /hpf   Epithelial Cells (non renal) >10 (H) 0 - 10 /hpf   Mucus, UA Present (A) Not Estab.   Bacteria, UA Many (A) None seen/Few  Urinalysis, Complete  Result Value Ref Range   Specific Gravity, UA 1.020 1.005 - 1.030   pH, UA 5.5 5.0 - 7.5   Color, UA Yellow Yellow   Appearance Ur Cloudy (A) Clear   Leukocytes, UA 3+ (A) Negative   Protein, UA 1+ (A) Negative/Trace   Glucose, UA Negative Negative   Ketones, UA 1+ (A) Negative   RBC, UA 2+ (A) Negative   Bilirubin, UA Negative Negative   Urobilinogen, Ur 1.0 0.2 - 1.0 mg/dL   Nitrite, UA Negative Negative   Microscopic Examination See below:    BCG Bladder Instillation  BCG # 3 of 6 month maintenance  Due to Bladder Cancer patient is present today for a BCG treatment. Patient was cleaned and prepped in a sterile fashion with betadine and lidocaine 2% jelly was instilled into the urethra.  A 14 FR catheter was inserted, urine return was noted 10 ml, urine was yellow in color.  22ml of reconstituted BCG was instilled into the bladder. The catheter was then removed. Patient tolerated well, no complications were noted  Preformed by: Marella Chimes, PA-C  Assessment & Plan:    1. Malignant neoplasm of urinary bladder, unspecified site [facility]):   History of high-grade T1, Tis of the right bladder neck dx 09/2014 followed by induction BCG x 6, and most recently maintenance BCG completed 04/2015. 3rd installment of maintenance BCG was given today.  Recommend continuation of BCG maintenance x 3 doses (3 months, 6 months, 12 months, 18 months, 24 months, 30 months and 36 months)  Cysto in 3 months   - Urinalysis, Complete   Return for keep cystoscopy appointment in June.  These notes generated with voice recognition software. I apologize for typographical errors.  Zara Council, [facility] [address] [place], [place] [address] [phone]

## 2015-08-29 ENCOUNTER — Other Ambulatory Visit: Payer: Self-pay

## 2015-08-29 ENCOUNTER — Encounter: Payer: Self-pay | Admitting: General Surgery

## 2015-08-29 ENCOUNTER — Ambulatory Visit (INDEPENDENT_AMBULATORY_CARE_PROVIDER_SITE_OTHER): Payer: Medicare Other | Admitting: General Surgery

## 2015-08-29 VITALS — BP 128/60 | HR 64 | Resp 12 | Ht 61.0 in | Wt 139.0 lb

## 2015-08-29 DIAGNOSIS — M7989 Other specified soft tissue disorders: Secondary | ICD-10-CM

## 2015-08-29 DIAGNOSIS — M799 Soft tissue disorder, unspecified: Secondary | ICD-10-CM

## 2015-08-29 NOTE — Patient Instructions (Signed)
Patient has been scheduled for a CT pelvis with contrast at [facility] for 09-02-15 at 11 am (arrive 10:45 am). Prep: NPO 4 hours prior and pick up prep kit. Patient verbalizes understanding.

## 2015-08-29 NOTE — Progress Notes (Signed)
Patient ID: [name], female   DOB: [birth year], 82 y.o.   MRN: [id number]  Chief Complaint  Patient presents with  . New Evaluation    Mass    HPI [name] is a 82 y.o. female.  Patient in today for painful lump on right lower back.  Doesn't know how long its been there, but she first noticed this 1 month ago.  No trauma of any kind.   I have reviewed the history of present illness with the patient. HPI  Past Medical History  Diagnosis Date  . Hypertension   . Anemia   . DDD (degenerative disc disease), cervical   . DDD (degenerative disc disease), lumbar   . Elevated lipids   . Osteoporosis   . Glaucoma   . Multiple gastric ulcers   . Foot pain   . Colon polyp   . Chronic low back pain   . Carotid arterial disease ([place]) 01/09/2014    Overview:  Minimal on screening   . Cancer [facility]) 2016    bladder tumor    Past Surgical History  Procedure Laterality Date  . Back surgery      x 6  . Carpal tunnel release Right   . Wrist fracture surgery Left   . Knee arthroscopy Right     x2  . Mandible surgery Bilateral     x2  . Cataract extraction Bilateral   . Transurethral resection of bladder tumor N/A 09/29/2014    Procedure: TRANSURETHRAL RESECTION OF BLADDER TUMOR (TURBT);  Surgeon: Hollice Espy, MD;  Location: [facility] ORS;  Service: Urology;  Laterality: N/A;  . Cystoscopy w/ retrogrades Bilateral 09/29/2014    Procedure: CYSTOSCOPY WITH RETROGRADE PYELOGRAM;  Surgeon: Hollice Espy, MD;  Location: [facility] ORS;  Service: Urology;  Laterality: Bilateral;  . Eye surgery    . Cystoscopy with biopsy N/A 11/02/2014    Procedure: CYSTOSCOPY WITH BIOPSY/WITH MITOMYCIN;  Surgeon: Hollice Espy, MD;  Location: [facility] ORS;  Service: Urology;  Laterality: N/A;  . Foot surgery Right   . Esophagogastroduodenoscopy (egd) with propofol N/A 05/10/2015    Procedure: ESOPHAGOGASTRODUODENOSCOPY (EGD) WITH PROPOFOL;  Surgeon: Lollie Sails, MD;  Location: [facility] ENDOSCOPY;   Service: Endoscopy;  Laterality: N/A;  . Shoulder arthroscopy with rotator cuff repair and subacromial decompression Right 05/25/2015    Procedure: SHOULDER ARTHROSCOPY WITH ROTATOR CUFF REPAIR AND SUBACROMIAL DECOMPRESSION, release long head biceps tendon;  Surgeon: Leanor Kail, MD;  Location: [facility] ORS;  Service: Orthopedics;  Laterality: Right;  . Abdominal hysterectomy  1973    Family History  Problem Relation Age of Onset  . Heart attack Father   . Emphysema Father   . Diabetes type II Sister   . Heart disease Mother   . Hypertension Mother   . Kidney disease Neg Hx   . Bladder Cancer Neg Hx   . Breast cancer Neg Hx     Social History Social History  Substance Use Topics  . Smoking status: Never Smoker   . Smokeless tobacco: Never Used  . Alcohol Use: No    No Known Allergies  Current Outpatient Prescriptions  Medication Sig Dispense Refill  . acetaminophen (TYLENOL) 650 MG CR tablet Take 650 mg by mouth every 8 (eight) hours as needed for pain. Reported on 08/26/2015    . Ascorbic Acid (VITAMIN C) 1000 MG tablet Take 1,000 mg by mouth daily.    . Calcium Carb-Ergocalciferol 250-125 MG-UNIT TABS Take 1 tablet by mouth.    . calcium-vitamin  D (OSCAL WITH D) 500-200 MG-UNIT per tablet Take 2 tablets by mouth 2 (two) times daily.     . carvedilol (COREG) 25 MG tablet Take 25 mg by mouth 2 (two) times daily with a meal.    . diltiazem (CARDIZEM CD) 240 MG 24 hr capsule Take 240 mg by mouth daily.    . ferrous sulfate 325 (65 FE) MG tablet Take 325 mg by mouth daily with breakfast.    . latanoprost (XALATAN) 0.005 % ophthalmic solution Place 1 drop into both eyes at bedtime.    . pantoprazole (PROTONIX) 40 MG tablet   0  . pravastatin (PRAVACHOL) 40 MG tablet Take 40 mg by mouth daily at 6 PM.    . raloxifene (EVISTA) 60 MG tablet Take 60 mg by mouth daily.     Current Facility-Administered Medications  Medication Dose Route Frequency Provider Last Rate Last Dose  . BCG  vaccine (THERACYS) injection 81 mg  81 mg Bladder Instillation Once Roda Shutters, FNP      . sodium chloride flush (NS) 0.9 % injection 50 mL  50 mL Intracatheter Once Nori Riis, PA-C        Review of Systems Review of Systems  Constitutional: Negative.   Respiratory: Negative.   Cardiovascular: Negative.     Blood pressure 128/60, pulse 64, resp. rate 12, height '5\' 1"'  (1.549 m), weight 139 lb (63.05 kg).  Physical Exam Physical Exam  Constitutional: She is oriented to person, place, and time. She appears well-developed and well-nourished.  Eyes: Conjunctivae and EOM are normal. No scleral icterus.  Neck: Neck supple.  Cardiovascular: Normal rate, regular rhythm and normal heart sounds.   Pulmonary/Chest: Effort normal and breath sounds normal.  Abdominal: Soft. Bowel sounds are normal. She exhibits no mass. There is no tenderness.  Musculoskeletal:       Back:  Lymphadenopathy:    She has no cervical adenopathy.       Right: No inguinal adenopathy present.       Left: No inguinal adenopathy present.  Neurological: She is alert and oriented to person, place, and time.  Skin: Skin is warm and dry.    Data Reviewed PCP notes.  [place] was performed-ill defined , irregular, elongated hypoechoic mass- suspect a hematoma. CT may help  Assessment    Right gluteal mass-suspect a hematoma but there is no history of any trauma. CT to assess this further    Plan    Patient is agreeable to CT. This is to be scheduled.  Patient has been scheduled for a CT pelvis with contrast at [place] for 09-02-15 at 11 am (arrive 10:45 am). Prep: NPO 4 hours prior and pick up prep kit. Patient verbalizes understanding.      This information has been scribed by Verlene Mayer, CMA       PCP: Dr. Frances Furbish 08/29/2015, 11:12 AM

## 2015-08-30 ENCOUNTER — Telehealth: Payer: Self-pay | Admitting: *Deleted

## 2015-08-30 NOTE — Telephone Encounter (Signed)
Patient called to let you know that she forgot to mention yesterday 08/29/15 at her office visit, that she did receive shots in her back. She stated she had the shots for pain, she believes it was Cortizone shots. She wanted to let you know.

## 2015-09-02 ENCOUNTER — Ambulatory Visit
Admission: RE | Admit: 2015-09-02 | Discharge: 2015-09-02 | Disposition: A | Discharge status: Home / Self Care | Payer: Medicare Other | Source: Ambulatory Visit | Attending: General Surgery | Admitting: General Surgery

## 2015-09-02 DIAGNOSIS — M799 Soft tissue disorder, unspecified: Secondary | ICD-10-CM | POA: Diagnosis not present

## 2015-09-02 DIAGNOSIS — M7989 Other specified soft tissue disorders: Secondary | ICD-10-CM

## 2015-09-02 MED ORDER — IOPAMIDOL (ISOVUE-300) INJECTION 61%
100.0000 mL | Freq: Once | INTRAVENOUS | Status: AC | PRN
  Administered 2015-09-02: 100 mL via INTRAVENOUS

## 2015-09-05 ENCOUNTER — Encounter: Payer: Self-pay | Admitting: General Surgery

## 2015-09-05 ENCOUNTER — Ambulatory Visit (INDEPENDENT_AMBULATORY_CARE_PROVIDER_SITE_OTHER): Payer: Medicare Other | Admitting: General Surgery

## 2015-09-05 VITALS — BP 124/68 | HR 80 | Resp 12 | Ht 61.0 in | Wt 142.0 lb

## 2015-09-05 DIAGNOSIS — M799 Soft tissue disorder, unspecified: Secondary | ICD-10-CM | POA: Diagnosis not present

## 2015-09-05 DIAGNOSIS — S7001XD Contusion of right hip, subsequent encounter: Secondary | ICD-10-CM

## 2015-09-05 DIAGNOSIS — M7989 Other specified soft tissue disorders: Secondary | ICD-10-CM

## 2015-09-05 NOTE — Patient Instructions (Signed)
Patient to have labs drawn at [facility].

## 2015-09-05 NOTE — Progress Notes (Signed)
Patient ID: [name], female   DOB: [birth year], 82 y.o.   MRN: [id number]  Chief Complaint  Patient presents with  . Follow-up    CT scan    HPI [name] is a 82 y.o. female here today for her follow up from a ct scan done on 09/02/15 on her right gluteal mass. I have reviewed the history of present illness with the patient. HPI  Past Medical History  Diagnosis Date  . Hypertension   . Anemia   . DDD (degenerative disc disease), cervical   . DDD (degenerative disc disease), lumbar   . Elevated lipids   . Osteoporosis   . Glaucoma   . Multiple gastric ulcers   . Foot pain   . Colon polyp   . Chronic low back pain   . Carotid arterial disease ([place]) 01/09/2014    Overview:  Minimal on screening   . Cancer [facility]) 2016    bladder tumor    Past Surgical History  Procedure Laterality Date  . Back surgery      x 6  . Carpal tunnel release Right   . Wrist fracture surgery Left   . Knee arthroscopy Right     x2  . Mandible surgery Bilateral     x2  . Cataract extraction Bilateral   . Transurethral resection of bladder tumor N/A 09/29/2014    Procedure: TRANSURETHRAL RESECTION OF BLADDER TUMOR (TURBT);  Surgeon: Hollice Espy, MD;  Location: [facility] ORS;  Service: Urology;  Laterality: N/A;  . Cystoscopy w/ retrogrades Bilateral 09/29/2014    Procedure: CYSTOSCOPY WITH RETROGRADE PYELOGRAM;  Surgeon: Hollice Espy, MD;  Location: [facility] ORS;  Service: Urology;  Laterality: Bilateral;  . Eye surgery    . Cystoscopy with biopsy N/A 11/02/2014    Procedure: CYSTOSCOPY WITH BIOPSY/WITH MITOMYCIN;  Surgeon: Hollice Espy, MD;  Location: [facility] ORS;  Service: Urology;  Laterality: N/A;  . Foot surgery Right   . Esophagogastroduodenoscopy (egd) with propofol N/A 05/10/2015    Procedure: ESOPHAGOGASTRODUODENOSCOPY (EGD) WITH PROPOFOL;  Surgeon: Lollie Sails, MD;  Location: [facility] ENDOSCOPY;  Service: Endoscopy;  Laterality: N/A;  . Shoulder arthroscopy with rotator cuff  repair and subacromial decompression Right 05/25/2015    Procedure: SHOULDER ARTHROSCOPY WITH ROTATOR CUFF REPAIR AND SUBACROMIAL DECOMPRESSION, release long head biceps tendon;  Surgeon: Leanor Kail, MD;  Location: [facility] ORS;  Service: Orthopedics;  Laterality: Right;  . Abdominal hysterectomy  1973    Family History  Problem Relation Age of Onset  . Heart attack Father   . Emphysema Father   . Diabetes type II Sister   . Heart disease Mother   . Hypertension Mother   . Kidney disease Neg Hx   . Bladder Cancer Neg Hx   . Breast cancer Neg Hx     Social History Social History  Substance Use Topics  . Smoking status: Never Smoker   . Smokeless tobacco: Never Used  . Alcohol Use: No    No Known Allergies  Current Outpatient Prescriptions  Medication Sig Dispense Refill  . acetaminophen (TYLENOL) 650 MG CR tablet Take 650 mg by mouth every 8 (eight) hours as needed for pain. Reported on 08/26/2015    . Ascorbic Acid (VITAMIN C) 1000 MG tablet Take 1,000 mg by mouth daily.    . Calcium Carb-Ergocalciferol 250-125 MG-UNIT TABS Take 1 tablet by mouth.    . calcium-vitamin D (OSCAL WITH D) 500-200 MG-UNIT per tablet Take 2 tablets by mouth 2 (two) times daily.     [name]  carvedilol (COREG) 25 MG tablet Take 25 mg by mouth 2 (two) times daily with a meal.    . diltiazem (CARDIZEM CD) 240 MG 24 hr capsule Take 240 mg by mouth daily.    . ferrous sulfate 325 (65 FE) MG tablet Take 325 mg by mouth daily with breakfast.    . latanoprost (XALATAN) 0.005 % ophthalmic solution Place 1 drop into both eyes at bedtime.    . pantoprazole (PROTONIX) 40 MG tablet   0  . pravastatin (PRAVACHOL) 40 MG tablet Take 40 mg by mouth daily at 6 PM.    . raloxifene (EVISTA) 60 MG tablet Take 60 mg by mouth daily.     Current Facility-Administered Medications  Medication Dose Route Frequency Provider Last Rate Last Dose  . BCG vaccine (THERACYS) injection 81 mg  81 mg Bladder Instillation Once Roda Shutters, FNP      . sodium chloride flush (NS) 0.9 % injection 50 mL  50 mL Intracatheter Once Nori Riis, PA-C        Review of Systems Review of Systems  Constitutional: Negative.   Respiratory: Negative.   Cardiovascular: Negative.     Blood pressure 124/68, pulse 80, resp. rate 12, height 5' 1" (1.549 m), weight 142 lb (64.411 kg).  Physical Exam Physical Exam  Constitutional: She is oriented to person, place, and time. She appears well-developed and well-nourished.  Eyes: Conjunctivae are normal. No scleral icterus.  Neck: Neck supple.  Lymphadenopathy:       Right: No inguinal adenopathy present.       Left: No inguinal adenopathy present.  Neurological: She is alert and oriented to person, place, and time.  Skin: Skin is warm and dry.       Data Reviewed Ct scan reviewed. Recent labs Assessment   There is a 14cm long fluid collectinn spanning the right gluteal area and going over iliac crest. No connection to spine area. Pt does remember she has gotten some shots in the gluteal vicinity from prior spine problems. Also she has chronic anemia, hgb generally around 8.0. Right gluteal mass- hematoma vs abscess. She has no fever or chills and hematoma seems more likely     Plan   Will discuss with radiology regarding percutaneous drainage.  Patient to have the following labs drawn at [place] today: CBC and Met C.  All of this discussed with pt and she is agreeable. PCP:  Ouida Sills This information has been scribed by Gaspar Cola CMA.        Ellarie Picking G 09/05/2015, 10:39 AM

## 2015-09-06 ENCOUNTER — Encounter: Payer: Self-pay | Admitting: General Surgery

## 2015-09-06 ENCOUNTER — Other Ambulatory Visit: Payer: Self-pay | Admitting: General Surgery

## 2015-09-06 DIAGNOSIS — M799 Soft tissue disorder, unspecified: Secondary | ICD-10-CM

## 2015-09-06 DIAGNOSIS — L0291 Cutaneous abscess, unspecified: Secondary | ICD-10-CM

## 2015-09-06 DIAGNOSIS — L0390 Cellulitis, unspecified: Principal | ICD-10-CM

## 2015-09-06 DIAGNOSIS — L0231 Cutaneous abscess of buttock: Secondary | ICD-10-CM

## 2015-09-06 LAB — COMPREHENSIVE METABOLIC PANEL
ALBUMIN: 3.3 g/dL — AB (ref 3.5–4.7)
ALT: 8 IU/L (ref 0–32)
AST: 10 IU/L (ref 0–40)
Albumin/Globulin Ratio: 0.9 — ABNORMAL LOW (ref 1.2–2.2)
Alkaline Phosphatase: 71 IU/L (ref 39–117)
BUN/Creatinine Ratio: 16 (ref 12–28)
BUN: 10 mg/dL (ref 8–27)
Bilirubin Total: 0.5 mg/dL (ref 0.0–1.2)
CALCIUM: 9.1 mg/dL (ref 8.7–10.3)
CO2: 23 mmol/L (ref 18–29)
Chloride: 104 mmol/L (ref 96–106)
Creatinine, Ser: 0.61 mg/dL (ref 0.57–1.00)
GFR, EST AFRICAN AMERICAN: 99 mL/min/{1.73_m2} (ref >59)
GFR, EST NON AFRICAN AMERICAN: 86 mL/min/{1.73_m2} (ref >59)
GLUCOSE: 97 mg/dL (ref 65–99)
Globulin, Total: 3.5 g/dL (ref 1.5–4.5)
Potassium: 4.9 mmol/L (ref 3.5–5.2)
Sodium: 144 mmol/L (ref 134–144)
TOTAL PROTEIN: 6.8 g/dL (ref 6.0–8.5)

## 2015-09-06 LAB — CBC WITH DIFFERENTIAL/PLATELET
BASOS ABS: 0 10*3/uL (ref 0.0–0.2)
BASOS: 0 %
EOS (ABSOLUTE): 0.1 10*3/uL (ref 0.0–0.4)
Eos: 1 %
HEMOGLOBIN: 8.2 g/dL — AB (ref 11.1–15.9)
Hematocrit: 27.6 % — ABNORMAL LOW (ref 34.0–46.6)
IMMATURE GRANS (ABS): 0 10*3/uL (ref 0.0–0.1)
IMMATURE GRANULOCYTES: 0 %
LYMPHS: 21 %
Lymphocytes Absolute: 1.8 10*3/uL (ref 0.7–3.1)
MCH: 24.6 pg — ABNORMAL LOW (ref 26.6–33.0)
MCHC: 29.7 g/dL — ABNORMAL LOW (ref 31.5–35.7)
MCV: 83 fL (ref 79–97)
MONOCYTES: 8 %
Monocytes Absolute: 0.6 10*3/uL (ref 0.1–0.9)
NEUTROS PCT: 70 %
Neutrophils Absolute: 5.9 10*3/uL (ref 1.4–7.0)
PLATELETS: 389 10*3/uL — AB (ref 150–379)
RBC: 3.33 x10E6/uL — ABNORMAL LOW (ref 3.77–5.28)
RDW: 17.6 % — ABNORMAL HIGH (ref 12.3–15.4)
WBC: 8.3 10*3/uL (ref 3.4–10.8)

## 2015-09-06 NOTE — Patient Instructions (Signed)
Patient has been scheduled for a CT guided drainage of gluteal hematoma/abscess on 09/15/15.  She is to arrive at 8:30 for a 9:30 procedure.  Must be NPO for 8 hours prior to the procedure.  Patient needs to have a PT/INR and APPT done at least 48 hours prior to procedure. Patient has been notified and understands instructions.

## 2015-09-14 ENCOUNTER — Other Ambulatory Visit
Admission: RE | Admit: 2015-09-14 | Discharge: 2015-09-14 | Disposition: A | Discharge status: Home / Self Care | Payer: Medicare Other | Source: Ambulatory Visit | Attending: General Surgery | Admitting: General Surgery

## 2015-09-14 ENCOUNTER — Other Ambulatory Visit: Payer: Self-pay | Admitting: General Surgery

## 2015-09-14 ENCOUNTER — Inpatient Hospital Stay: Payer: Medicare Other | Attending: Family Medicine

## 2015-09-14 ENCOUNTER — Telehealth: Payer: Self-pay | Admitting: *Deleted

## 2015-09-14 ENCOUNTER — Inpatient Hospital Stay (HOSPITAL_BASED_OUTPATIENT_CLINIC_OR_DEPARTMENT_OTHER): Payer: Medicare Other | Admitting: Family Medicine

## 2015-09-14 VITALS — BP 126/81 | HR 79 | Temp 98.2°F | Wt 139.8 lb

## 2015-09-14 DIAGNOSIS — M5136 Other intervertebral disc degeneration, lumbar region: Secondary | ICD-10-CM | POA: Insufficient documentation

## 2015-09-14 DIAGNOSIS — R5383 Other fatigue: Secondary | ICD-10-CM | POA: Diagnosis not present

## 2015-09-14 DIAGNOSIS — R222 Localized swelling, mass and lump, trunk: Secondary | ICD-10-CM

## 2015-09-14 DIAGNOSIS — C679 Malignant neoplasm of bladder, unspecified: Secondary | ICD-10-CM | POA: Diagnosis not present

## 2015-09-14 DIAGNOSIS — Z79899 Other long term (current) drug therapy: Secondary | ICD-10-CM

## 2015-09-14 DIAGNOSIS — R5381 Other malaise: Secondary | ICD-10-CM | POA: Insufficient documentation

## 2015-09-14 DIAGNOSIS — D509 Iron deficiency anemia, unspecified: Secondary | ICD-10-CM | POA: Diagnosis present

## 2015-09-14 DIAGNOSIS — K2960 Other gastritis without bleeding: Secondary | ICD-10-CM | POA: Diagnosis not present

## 2015-09-14 DIAGNOSIS — M5030 Other cervical disc degeneration, unspecified cervical region: Secondary | ICD-10-CM

## 2015-09-14 DIAGNOSIS — D500 Iron deficiency anemia secondary to blood loss (chronic): Secondary | ICD-10-CM | POA: Diagnosis not present

## 2015-09-14 DIAGNOSIS — I10 Essential (primary) hypertension: Secondary | ICD-10-CM | POA: Diagnosis not present

## 2015-09-14 DIAGNOSIS — G8929 Other chronic pain: Secondary | ICD-10-CM

## 2015-09-14 DIAGNOSIS — M545 Low back pain: Secondary | ICD-10-CM | POA: Insufficient documentation

## 2015-09-14 DIAGNOSIS — L0291 Cutaneous abscess, unspecified: Secondary | ICD-10-CM | POA: Diagnosis not present

## 2015-09-14 DIAGNOSIS — M799 Soft tissue disorder, unspecified: Secondary | ICD-10-CM

## 2015-09-14 LAB — CBC WITH DIFFERENTIAL/PLATELET
BASOS ABS: 0.1 10*3/uL (ref 0–0.1)
BASOS PCT: 1 %
EOS PCT: 1 %
Eosinophils Absolute: 0.1 10*3/uL (ref 0–0.7)
HCT: 28 % — ABNORMAL LOW (ref 35.0–47.0)
Hemoglobin: 8.9 g/dL — ABNORMAL LOW (ref 12.0–16.0)
Lymphocytes Relative: 20 %
Lymphs Abs: 1.6 10*3/uL (ref 1.0–3.6)
MCH: 25.6 pg — ABNORMAL LOW (ref 26.0–34.0)
MCHC: 31.8 g/dL — ABNORMAL LOW (ref 32.0–36.0)
MCV: 80.6 fL (ref 80.0–100.0)
MONO ABS: 0.6 10*3/uL (ref 0.2–0.9)
Monocytes Relative: 8 %
Neutro Abs: 5.6 10*3/uL (ref 1.4–6.5)
Neutrophils Relative %: 70 %
PLATELETS: 379 10*3/uL (ref 150–440)
RBC: 3.48 MIL/uL — ABNORMAL LOW (ref 3.80–5.20)
RDW: 19.7 % — AB (ref 11.5–14.5)
WBC: 8 10*3/uL (ref 3.6–11.0)

## 2015-09-14 LAB — COMPREHENSIVE METABOLIC PANEL
ALT: 11 U/L — ABNORMAL LOW (ref 14–54)
ANION GAP: 3 — AB (ref 5–15)
AST: 15 U/L (ref 15–41)
Albumin: 3.2 g/dL — ABNORMAL LOW (ref 3.5–5.0)
Alkaline Phosphatase: 71 U/L (ref 38–126)
BUN: 11 mg/dL (ref 6–20)
CALCIUM: 8.5 mg/dL — AB (ref 8.9–10.3)
CHLORIDE: 107 mmol/L (ref 101–111)
CO2: 26 mmol/L (ref 22–32)
Creatinine, Ser: 0.66 mg/dL (ref 0.44–1.00)
Glucose, Bld: 91 mg/dL (ref 65–99)
POTASSIUM: 3.7 mmol/L (ref 3.5–5.1)
Sodium: 136 mmol/L (ref 135–145)
TOTAL PROTEIN: 7.7 g/dL (ref 6.5–8.1)
Total Bilirubin: 0.6 mg/dL (ref 0.3–1.2)

## 2015-09-14 LAB — IRON AND TIBC
IRON: 16 ug/dL — AB (ref 28–170)
Saturation Ratios: 7 % — ABNORMAL LOW (ref 10.4–31.8)
TIBC: 226 ug/dL — ABNORMAL LOW (ref 250–450)
UIBC: 210 ug/dL

## 2015-09-14 LAB — FERRITIN: Ferritin: 293 ng/mL (ref 11–307)

## 2015-09-14 NOTE — Telephone Encounter (Signed)
Patient contacted today to clarify instructions for scheduled CT guided drainage of gluteal abscess. This patient needs to report tomorrow 09-15-15 at 10:30 am at the [facility] registration desk. Prep: NPO 8 hours prior but patient to take blood pressure pills tomorrow morning with a small sip of water.   This patient will report to the [facility] registration desk today to have the following labs drawn: PT/INR, APTT.  Patient verbalizes understanding.

## 2015-09-14 NOTE — Progress Notes (Signed)
[facility]  Telephone:([phone]) [phone]  Fax:([phone]) [phone]     AISHAH Hahn DOB: 09-19-1932  MR#: [id number]  SUP#:[id number]  Patient Care Team: Kirk Ruths, MD as PCP - General (Internal Medicine) Hollice Espy, MD as Consulting Physician (Urology) Kirk Ruths, MD (Internal Medicine) Seeplaputhur Robinette Haines, MD (General Surgery)  CHIEF COMPLAINT:  Chief Complaint  Patient presents with  . Anemia, iron deficiency    INTERVAL HISTORY:   Patient is here for further evaluation and treatment consideration regarding symptomatic anemia, initially diagnosed in March 2016 during hospitalization, likely due to erosive gastritis causing iron deficiency. She overall reports feeling very well in general. She has had right shoulder surgery for a torn rotator cuff as well as a new right gluteal mass that developed just over the last several weeks. She is being followed by Dr. Jamal Collin and is scheduled for drainage of the mass tomorrow. Patient also has a history of superficial bladder cancer that was seen in 2016. She had a subsequent TURBT and is followed closely by Dr. Erlene Quan.  REVIEW OF SYSTEMS:   Review of Systems  Constitutional: Positive for malaise/fatigue. Negative for fever, chills, weight loss and diaphoresis.       Right gluteal mass, being evaluated by Dr. Jamal Collin  HENT: Negative.   Eyes: Negative.   Respiratory: Negative for cough, hemoptysis, sputum production, shortness of breath and wheezing.   Cardiovascular: Negative for chest pain, palpitations, orthopnea, claudication, leg swelling and PND.  Gastrointestinal: Negative for heartburn, nausea, vomiting, abdominal pain, diarrhea, constipation, blood in stool and melena.  Genitourinary: Negative.   Musculoskeletal: Positive for joint pain.       Right shoulder r/t recent injury and surgery Right gluteal abscess being followed by Dr. Jamal Collin.  Skin: Negative.   Neurological: Negative for dizziness,  tingling, focal weakness, seizures and weakness.  Endo/Heme/Allergies: Does not bruise/bleed easily.  Psychiatric/Behavioral: Negative for depression. The patient is not nervous/anxious and does not have insomnia.     As per HPI. Otherwise, a complete review of systems is negatve.   PAST MEDICAL HISTORY: Past Medical History  Diagnosis Date  . Hypertension   . Anemia   . DDD (degenerative disc disease), cervical   . DDD (degenerative disc disease), lumbar   . Elevated lipids   . Osteoporosis   . Glaucoma   . Multiple gastric ulcers   . Foot pain   . Colon polyp   . Chronic low back pain   . Carotid arterial disease ([place]) 01/09/2014    Overview:  Minimal on screening   . Cancer [facility]) 2016    bladder tumor    PAST SURGICAL HISTORY: Past Surgical History  Procedure Laterality Date  . Back surgery      x 6  . Carpal tunnel release Right   . Wrist fracture surgery Left   . Knee arthroscopy Right     x2  . Mandible surgery Bilateral     x2  . Cataract extraction Bilateral   . Transurethral resection of bladder tumor N/A 09/29/2014    Procedure: TRANSURETHRAL RESECTION OF BLADDER TUMOR (TURBT);  Surgeon: Hollice Espy, MD;  Location: [facility] ORS;  Service: Urology;  Laterality: N/A;  . Cystoscopy w/ retrogrades Bilateral 09/29/2014    Procedure: CYSTOSCOPY WITH RETROGRADE PYELOGRAM;  Surgeon: Hollice Espy, MD;  Location: [facility] ORS;  Service: Urology;  Laterality: Bilateral;  . Eye surgery    . Cystoscopy with biopsy N/A 11/02/2014    Procedure: CYSTOSCOPY WITH BIOPSY/WITH MITOMYCIN;  Surgeon: Hollice Espy, MD;  Location: [facility] ORS;  Service: Urology;  Laterality: N/A;  . Foot surgery Right   . Esophagogastroduodenoscopy (egd) with propofol N/A 05/10/2015    Procedure: ESOPHAGOGASTRODUODENOSCOPY (EGD) WITH PROPOFOL;  Surgeon: Lollie Sails, MD;  Location: [facility] ENDOSCOPY;  Service: Endoscopy;  Laterality: N/A;  . Shoulder arthroscopy with rotator cuff repair and subacromial  decompression Right 05/25/2015    Procedure: SHOULDER ARTHROSCOPY WITH ROTATOR CUFF REPAIR AND SUBACROMIAL DECOMPRESSION, release long head biceps tendon;  Surgeon: Leanor Kail, MD;  Location: [facility] ORS;  Service: Orthopedics;  Laterality: Right;  . Abdominal hysterectomy  1973    FAMILY HISTORY Family History  Problem Relation Age of Onset  . Heart attack Father   . Emphysema Father   . Diabetes type II Sister   . Heart disease Mother   . Hypertension Mother   . Kidney disease Neg Hx   . Bladder Cancer Neg Hx   . Breast cancer Neg Hx     GYNECOLOGIC HISTORY:  No LMP recorded. Patient has had a hysterectomy.     ADVANCED DIRECTIVES:    HEALTH MAINTENANCE: Social History  Substance Use Topics  . Smoking status: Never Smoker   . Smokeless tobacco: Never Used  . Alcohol Use: No     Colonoscopy:  PAP:  Bone density:  Mammogram:  No Known Allergies  Current Outpatient Prescriptions  Medication Sig Dispense Refill  . acetaminophen (TYLENOL) 650 MG CR tablet Take 650 mg by mouth every 8 (eight) hours as needed for pain. Reported on 08/26/2015    . Ascorbic Acid (VITAMIN C) 1000 MG tablet Take 1,000 mg by mouth daily.    . calcium-vitamin D (OSCAL WITH D) 500-200 MG-UNIT per tablet Take 2 tablets by mouth 2 (two) times daily.     . carvedilol (COREG) 25 MG tablet Take 25 mg by mouth 2 (two) times daily with a meal.    . diltiazem (CARDIZEM CD) 240 MG 24 hr capsule Take 240 mg by mouth daily.    . ferrous sulfate 325 (65 FE) MG tablet Take 325 mg by mouth daily with breakfast.    . latanoprost (XALATAN) 0.005 % ophthalmic solution Place 1 drop into both eyes at bedtime.    . pantoprazole (PROTONIX) 40 MG tablet   0  . pravastatin (PRAVACHOL) 40 MG tablet Take 40 mg by mouth daily at 6 PM.    . raloxifene (EVISTA) 60 MG tablet Take 60 mg by mouth daily.     Current Facility-Administered Medications  Medication Dose Route Frequency Provider Last Rate Last Dose  . BCG  vaccine (THERACYS) injection 81 mg  81 mg Bladder Instillation Once Roda Shutters, FNP      . sodium chloride flush (NS) 0.9 % injection 50 mL  50 mL Intracatheter Once [employer], PA-C        OBJECTIVE: BP 126/81 mmHg  Pulse 79  Temp(Src) 98.2 F (36.8 C) (Oral)  Wt 139 lb 12.4 oz (63.4 kg)   Body mass index is 26.42 kg/(m^2).    ECOG FS:0 - Asymptomatic  General: Well-developed, well-nourished, no acute distress. Eyes: Pink conjunctiva, anicteric sclera. HEENT: Normocephalic, moist mucous membranes, clear oropharnyx. Lungs: Clear to auscultation bilaterally. Heart: Regular rate and rhythm. No rubs, murmurs, or gallops. Abdomen: Soft, nontender, nondistended. No organomegaly noted, normoactive bowel sounds. Musculoskeletal: No edema, cyanosis, or clubbing.  Large, firm right gluteal mass. Neuro: Alert, answering all questions appropriately. Cranial nerves grossly intact. Skin: No rashes or petechiae  noted. Psych: Normal affect. Lymphatics: No cervical, clavicular, or axillary LAD.   LAB RESULTS:  Appointment on 09/14/2015  Component Date Value Ref Range Status  . WBC 09/14/2015 8.0  3.6 - 11.0 K/uL Final  . RBC 09/14/2015 3.48* 3.80 - 5.20 MIL/uL Final  . Hemoglobin 09/14/2015 8.9* 12.0 - 16.0 g/dL Final  . HCT 09/14/2015 28.0* 35.0 - 47.0 % Final  . MCV 09/14/2015 80.6  80.0 - 100.0 fL Final  . MCH 09/14/2015 25.6* 26.0 - 34.0 pg Final  . MCHC 09/14/2015 31.8* 32.0 - 36.0 g/dL Final  . RDW 09/14/2015 19.7* 11.5 - 14.5 % Final  . Platelets 09/14/2015 379  150 - 440 K/uL Final  . Neutrophils Relative % 09/14/2015 70   Final  . Neutro Abs 09/14/2015 5.6  1.4 - 6.5 K/uL Final  . Lymphocytes Relative 09/14/2015 20   Final  . Lymphs Abs 09/14/2015 1.6  1.0 - 3.6 K/uL Final  . Monocytes Relative 09/14/2015 8   Final  . Monocytes Absolute 09/14/2015 0.6  0.2 - 0.9 K/uL Final  . Eosinophils Relative 09/14/2015 1   Final  . Eosinophils Absolute 09/14/2015 0.1  0 - 0.7  K/uL Final  . Basophils Relative 09/14/2015 1   Final  . Basophils Absolute 09/14/2015 0.1  0 - 0.1 K/uL Final  . Sodium 09/14/2015 136  135 - 145 mmol/L Final  . Potassium 09/14/2015 3.7  3.5 - 5.1 mmol/L Final  . Chloride 09/14/2015 107  101 - 111 mmol/L Final  . CO2 09/14/2015 26  22 - 32 mmol/L Final  . Glucose, Bld 09/14/2015 91  65 - 99 mg/dL Final  . BUN 09/14/2015 11  6 - 20 mg/dL Final  . Creatinine, Ser 09/14/2015 0.66  0.44 - 1.00 mg/dL Final  . Calcium 09/14/2015 8.5* 8.9 - 10.3 mg/dL Final  . Total Protein 09/14/2015 7.7  6.5 - 8.1 g/dL Final  . Albumin 09/14/2015 3.2* 3.5 - 5.0 g/dL Final  . AST 09/14/2015 15  15 - 41 U/L Final  . ALT 09/14/2015 11* 14 - 54 U/L Final  . Alkaline Phosphatase 09/14/2015 71  38 - 126 U/L Final  . Total Bilirubin 09/14/2015 0.6  0.3 - 1.2 mg/dL Final  . GFR calc non Af Amer 09/14/2015 >60  >60 mL/min Final  . GFR calc Af Amer 09/14/2015 >60  >60 mL/min Final   Comment: (NOTE) The eGFR has been calculated using the CKD EPI equation. This calculation has not been validated in all clinical situations. eGFR's persistently <60 mL/min signify possible Chronic Kidney Disease.   . Anion gap 09/14/2015 3* 5 - 15 Final    STUDIES: No results found.  ASSESSMENT:   Iron deficiency anemia  Secondary to blood loss from erosive gastritis.  Superficial bladder cancer.  Right gluteal mass  PLAN:   1. IDA. Patient is currently taking ferrous sulfate 325 mg tablet once daily. Her hemoglobin is currently 8.9, ferritin 293, iron 16, TIBC 226. As previously discussed with patient she most likely has myelodysplastic syndrome. She and Dr. Rudean Hitt had previously discussed options including bone marrow biopsy but she was not interested at this time as she is asymptomatic.  2. Superficial bladder cancer.  This was discovered after patient had an unintentional weight loss with further workup in March 2016. She's currently followed by Dr. Erlene Quan and is  undergoing maintenance BCG therapy. She is scheduled for next cystoscopy in June 2017.  3. Right gluteal mass. Patient to have CT-guided drainage of fluid  that was collected in right gluteal area. She is following closely with Dr. Jamal Collin.  Will continue with current regimen of oral iron supplementation. We'll continue with routine follow-up in 3 months. Patient expressed understanding and was in agreement with this plan. She also understands that She can call clinic at any time with any questions, concerns, or complaints.   Dr. Oliva Bustard was available for consultation and review of plan of care for this patient.   Evlyn Kanner, NP   09/14/2015 12:06 PM

## 2015-09-15 ENCOUNTER — Ambulatory Visit: Admission: RE | Admit: 2015-09-15 | Payer: Medicare Other | Source: Ambulatory Visit

## 2015-09-15 ENCOUNTER — Ambulatory Visit: Payer: Medicare Other

## 2015-09-15 ENCOUNTER — Ambulatory Visit
Admission: RE | Admit: 2015-09-15 | Discharge: 2015-09-15 | Disposition: A | Discharge status: Home / Self Care | Payer: Medicare Other | Source: Ambulatory Visit | Attending: General Surgery | Admitting: General Surgery

## 2015-09-15 DIAGNOSIS — M5136 Other intervertebral disc degeneration, lumbar region: Secondary | ICD-10-CM | POA: Diagnosis not present

## 2015-09-15 DIAGNOSIS — M545 Low back pain: Secondary | ICD-10-CM | POA: Diagnosis not present

## 2015-09-15 DIAGNOSIS — L0231 Cutaneous abscess of buttock: Secondary | ICD-10-CM | POA: Insufficient documentation

## 2015-09-15 DIAGNOSIS — G8929 Other chronic pain: Secondary | ICD-10-CM | POA: Insufficient documentation

## 2015-09-15 DIAGNOSIS — E785 Hyperlipidemia, unspecified: Secondary | ICD-10-CM | POA: Insufficient documentation

## 2015-09-15 DIAGNOSIS — H409 Unspecified glaucoma: Secondary | ICD-10-CM | POA: Insufficient documentation

## 2015-09-15 DIAGNOSIS — Z8551 Personal history of malignant neoplasm of bladder: Secondary | ICD-10-CM | POA: Diagnosis not present

## 2015-09-15 DIAGNOSIS — M5030 Other cervical disc degeneration, unspecified cervical region: Secondary | ICD-10-CM | POA: Insufficient documentation

## 2015-09-15 DIAGNOSIS — M810 Age-related osteoporosis without current pathological fracture: Secondary | ICD-10-CM | POA: Diagnosis not present

## 2015-09-15 DIAGNOSIS — I10 Essential (primary) hypertension: Secondary | ICD-10-CM | POA: Insufficient documentation

## 2015-09-15 LAB — PROTIME-INR
INR: 1.22
Prothrombin Time: 15.6 seconds — ABNORMAL HIGH (ref 11.4–15.0)

## 2015-09-15 LAB — PROTEIN ELECTROPHORESIS, SERUM
A/G Ratio: 0.7 (ref 0.7–1.7)
Albumin ELP: 2.8 g/dL — ABNORMAL LOW (ref 2.9–4.4)
Alpha-1-Globulin: 0.5 g/dL — ABNORMAL HIGH (ref 0.0–0.4)
Alpha-2-Globulin: 1 g/dL (ref 0.4–1.0)
BETA GLOBULIN: 1 g/dL (ref 0.7–1.3)
GAMMA GLOBULIN: 1.4 g/dL (ref 0.4–1.8)
Globulin, Total: 4 g/dL — ABNORMAL HIGH (ref 2.2–3.9)
M-Spike, %: 1 g/dL — ABNORMAL HIGH
Total Protein ELP: 6.8 g/dL (ref 6.0–8.5)

## 2015-09-15 LAB — KAPPA/LAMBDA LIGHT CHAINS
Kappa free light chain: 138.31 mg/L — ABNORMAL HIGH (ref 3.30–19.40)
Kappa, lambda light chain ratio: 12.06 — ABNORMAL HIGH (ref 0.26–1.65)
Lambda free light chains: 11.47 mg/L (ref 5.71–26.30)

## 2015-09-15 LAB — APTT: aPTT: 32 seconds (ref 24–36)

## 2015-09-15 MED ORDER — MIDAZOLAM HCL 5 MG/5ML IJ SOLN
INTRAMUSCULAR | Status: AC | PRN
  Administered 2015-09-15: 1 mg via INTRAVENOUS

## 2015-09-15 MED ORDER — FENTANYL CITRATE (PF) 100 MCG/2ML IJ SOLN
INTRAMUSCULAR | Status: AC | PRN
  Administered 2015-09-15: 50 ug via INTRAVENOUS

## 2015-09-15 MED ORDER — SODIUM CHLORIDE 0.9 % IV SOLN
INTRAVENOUS | Status: DC
  Administered 2015-09-15: 11:00:00 via INTRAVENOUS

## 2015-09-15 NOTE — H&P (Signed)
Chief Complaint: Here for right gluteal abscess drain  Referring Physician(s): Sankar,Seeplaputhur G  History of Present Illness: [name] is a 82 y.o. female with right gluteal tenderness and firm palpable mass.  CT shows right gluteal fluid collection that tracks medially to the right paraspinous muscles.  Here today for CT guided asp and drain insertion.  Past Medical History  Diagnosis Date  . Hypertension   . Anemia   . DDD (degenerative disc disease), cervical   . DDD (degenerative disc disease), lumbar   . Elevated lipids   . Osteoporosis   . Glaucoma   . Multiple gastric ulcers   . Foot pain   . Colon polyp   . Chronic low back pain   . Carotid arterial disease ([place]) 01/09/2014    Overview:  Minimal on screening   . Cancer [facility]) 2016    bladder tumor    Past Surgical History  Procedure Laterality Date  . Back surgery      x 6  . Carpal tunnel release Right   . Wrist fracture surgery Left   . Knee arthroscopy Right     x2  . Mandible surgery Bilateral     x2  . Cataract extraction Bilateral   . Transurethral resection of bladder tumor N/A 09/29/2014    Procedure: TRANSURETHRAL RESECTION OF BLADDER TUMOR (TURBT);  Surgeon: Hollice Espy, MD;  Location: [facility] ORS;  Service: Urology;  Laterality: N/A;  . Cystoscopy w/ retrogrades Bilateral 09/29/2014    Procedure: CYSTOSCOPY WITH RETROGRADE PYELOGRAM;  Surgeon: Hollice Espy, MD;  Location: [facility] ORS;  Service: Urology;  Laterality: Bilateral;  . Eye surgery    . Cystoscopy with biopsy N/A 11/02/2014    Procedure: CYSTOSCOPY WITH BIOPSY/WITH MITOMYCIN;  Surgeon: Hollice Espy, MD;  Location: [facility] ORS;  Service: Urology;  Laterality: N/A;  . Foot surgery Right   . Esophagogastroduodenoscopy (egd) with propofol N/A 05/10/2015    Procedure: ESOPHAGOGASTRODUODENOSCOPY (EGD) WITH PROPOFOL;  Surgeon: Lollie Sails, MD;  Location: [facility] ENDOSCOPY;  Service: Endoscopy;  Laterality: N/A;  . Shoulder  arthroscopy with rotator cuff repair and subacromial decompression Right 05/25/2015    Procedure: SHOULDER ARTHROSCOPY WITH ROTATOR CUFF REPAIR AND SUBACROMIAL DECOMPRESSION, release long head biceps tendon;  Surgeon: Leanor Kail, MD;  Location: [facility] ORS;  Service: Orthopedics;  Laterality: Right;  . Abdominal hysterectomy  1973    Allergies: Review of patient's allergies indicates no known allergies.  Medications: Prior to Admission medications   Medication Sig Start Date End Date Taking? Authorizing Provider  acetaminophen (TYLENOL) 650 MG CR tablet Take 650 mg by mouth every 8 (eight) hours as needed for pain. Reported on 08/26/2015   Yes Historical Provider, MD  Ascorbic Acid (VITAMIN C) 1000 MG tablet Take 1,000 mg by mouth daily.   Yes Historical Provider, MD  calcium-vitamin D (OSCAL WITH D) 500-200 MG-UNIT per tablet Take 2 tablets by mouth 2 (two) times daily.    Yes Historical Provider, MD  carvedilol (COREG) 25 MG tablet Take 25 mg by mouth 2 (two) times daily with a meal.   Yes Historical Provider, MD  diltiazem (CARDIZEM CD) 240 MG 24 hr capsule Take 240 mg by mouth daily.   Yes Historical Provider, MD  latanoprost (XALATAN) 0.005 % ophthalmic solution Place 1 drop into both eyes at bedtime.   Yes Historical Provider, MD  pantoprazole (PROTONIX) 40 MG tablet  12/09/14  Yes Historical Provider, MD  ferrous sulfate 325 (65 FE) MG  tablet Take 325 mg by mouth daily with breakfast.    Historical Provider, MD  pravastatin (PRAVACHOL) 40 MG tablet Take 40 mg by mouth daily at 6 PM.    Historical Provider, MD  raloxifene (EVISTA) 60 MG tablet Take 60 mg by mouth daily.    Historical Provider, MD     Family History  Problem Relation Age of Onset  . Heart attack Father   . Emphysema Father   . Diabetes type II Sister   . Heart disease Mother   . Hypertension Mother   . Kidney disease Neg Hx   . Bladder Cancer Neg Hx   . Breast cancer Neg Hx     Social History   Social  History  . Marital Status: Widowed    Spouse Name: N/A  . Number of Children: N/A  . Years of Education: N/A   Social History Main Topics  . Smoking status: Never Smoker   . Smokeless tobacco: Never Used  . Alcohol Use: No  . Drug Use: No  . Sexual Activity: Not Asked   Other Topics Concern  . None   Social History Narrative    Review of Systems: A 12 point ROS discussed and pertinent positives are indicated in the HPI above.  All other systems are negative.  Review of Systems  Vital Signs: BP 118/47 mmHg  Pulse 63  Temp(Src) 98.4 F (36.9 C) (Oral)  Resp 17  SpO2 96%  Physical Exam  Constitutional: She appears well-developed and well-nourished. She appears distressed.  Cardiovascular: Normal rate and regular rhythm.   Murmur heard. Pulmonary/Chest: Effort normal and breath sounds normal. No respiratory distress.  Abdominal: Soft. Bowel sounds are normal. She exhibits no distension and no mass. There is no tenderness.  Musculoskeletal:  Firm right gluteal St tissue mass correlates with the abscess  Skin: She is not diaphoretic.    Mallampati Score:   2  Imaging: Ct Pelvis W Contrast  09/02/2015  CLINICAL DATA:  Painful right posterior pelvic swelling for 1 month. History of multiple lumbar surgeries, most recent in 2016. EXAM: CT PELVIS WITH CONTRAST TECHNIQUE: Multidetector CT imaging of the pelvis was performed using the standard protocol following the bolus administration of intravenous contrast. CONTRAST:  129mL ISOVUE-300 IOPAMIDOL (ISOVUE-300) INJECTION 61% COMPARISON:  08/04/2014 FINDINGS: There is a rim enhancing fluid collection extending from the right quadratus lumborum over the right iliac crest and into the subcutaneous right buttocks. The total length of the abnormality is 14 cm with maximal diameter of 4 cm. The patient is status post L2-L5 discectomy with posterior fusions from L1-S1. Chronic posterior paraspinal fluid collections noted on comparison CT  have decompressed. There is no historical indication of interval surgery. Reportedly the most recent spinal surgery was in 2008. No signs of hardware loosening or osteomyelitis. Visualized bowel is nonobstructed.  No acute vascular finding. There is remote trauma or osteitis pubis These results will be called to the ordering clinician or representative by the Radiologist Assistant, and communication documented in the PACS or zVision Dashboard. IMPRESSION: 14 cm long, 4 cm diameter rim enhancing fluid collection from the right quadratus lumborum to the right gluteal subcutaneous fat. The appearance is primarily concerning for a necessitating abscess, which has nearly reach the skin. Chronic hematoma could have this appearance but morphology and lack of trauma history makes this less likely. There were posterior paraspinal fluid collections seen on abdominal CT 13 months ago which have decompressed in the interim - is there known perispinal infection  or interval drainage? Electronically Signed   By: Monte Fantasia M.D.   On: 09/02/2015 13:05    Labs:  CBC:  Recent Labs  05/18/15 1047 05/24/15 1208 06/15/15 1120 09/05/15 0900 09/14/15 1053  WBC 9.6  --  9.9 8.3 8.0  HGB 8.9* 8.9* 8.8*  --  8.9*  HCT 27.8*  --  27.6* 27.6* 28.0*  PLT 402  --  398 389* 379    COAGS:  Recent Labs  09/15/15 1115  INR 1.22  APTT 32    BMP:  Recent Labs  05/18/15 1047 09/05/15 0900 09/14/15 1053  NA 136 144 136  K 3.9 4.9 3.7  CL 104 104 107  CO2 28 23 26   GLUCOSE 95 97 91  BUN 10 10 11   CALCIUM 8.2* 9.1 8.5*  CREATININE 0.66 0.61 0.66  GFRNONAA >60 86 >60  GFRAA >60 99 >60    LIVER FUNCTION TESTS:  Recent Labs  09/05/15 0900 09/14/15 1053  BILITOT 0.5 0.6  AST 10 15  ALT 8 11*  ALKPHOS 71 71  PROT 6.8 7.7  ALBUMIN 3.3* 3.2*     Assessment and Plan:  Right gluteal abscess tracking to the right Paraspinous muscles.  Plan for CT drain insertion today.  Risks and Benefits  discussed with the patient including bleeding, infection, damage to adjacent structures, bowel perforation/fistula connection, and sepsis. All of the patient's questions were answered, patient is agreeable to proceed. Consent signed and in chart.    Thank you for this interesting consult.  I greatly enjoyed meeting [name] and look forward to participating in their care.  A copy of this report was sent to the requesting provider on this date.  Electronically Signed: Greggory Keen 09/15/2015, 2:26 PM   I spent a total of  30 Minutes   in face to face in clinical consultation, greater than 50% of which was counseling/coordinating care for right gluteal abscess

## 2015-09-15 NOTE — OR Nursing (Signed)
CBC drawn 4/19.  Cancelled todays order

## 2015-09-19 ENCOUNTER — Ambulatory Visit (INDEPENDENT_AMBULATORY_CARE_PROVIDER_SITE_OTHER): Payer: Medicare Other | Admitting: General Surgery

## 2015-09-19 ENCOUNTER — Encounter: Payer: Self-pay | Admitting: General Surgery

## 2015-09-19 VITALS — BP 142/76 | HR 68 | Resp 12 | Ht 64.0 in | Wt 140.0 lb

## 2015-09-19 DIAGNOSIS — L0231 Cutaneous abscess of buttock: Secondary | ICD-10-CM | POA: Diagnosis not present

## 2015-09-19 DIAGNOSIS — M799 Soft tissue disorder, unspecified: Secondary | ICD-10-CM | POA: Diagnosis not present

## 2015-09-19 DIAGNOSIS — M7989 Other specified soft tissue disorders: Secondary | ICD-10-CM

## 2015-09-19 LAB — ANAEROBIC CULTURE: Special Requests: NORMAL

## 2015-09-19 LAB — CULTURE, ROUTINE-ABSCESS
CULTURE: NO GROWTH
SPECIAL REQUESTS: NORMAL

## 2015-09-19 NOTE — Progress Notes (Signed)
Patient ID: [name], female   DOB: Aug 01, 1932, 82 y.o.   MRN: [id number]  Chief Complaint  Patient presents with  . Follow-up     gluteal abscess    HPI [name] is a 82 y.o. female here today to follow up gluteal abscess. She had a CT guided drainage of this done on 09/15/15. She has a drain in place. She did not have a drain sheet. She states that she empty her drain twice daily.   I have reviewed the history of present illness with the patient.                                                                                                                                                                            HPI  Past Medical History  Diagnosis Date  . Hypertension   . Anemia   . DDD (degenerative disc disease), cervical   . DDD (degenerative disc disease), lumbar   . Elevated lipids   . Osteoporosis   . Glaucoma   . Multiple gastric ulcers   . Foot pain   . Colon polyp   . Chronic low back pain   . Carotid arterial disease ([place]) 01/09/2014    Overview:  Minimal on screening   . Cancer [facility]) 2016    bladder tumor    Past Surgical History  Procedure Laterality Date  . Back surgery      x 6  . Carpal tunnel release Right   . Wrist fracture surgery Left   . Knee arthroscopy Right     x2  . Mandible surgery Bilateral     x2  . Cataract extraction Bilateral   . Transurethral resection of bladder tumor N/A 09/29/2014    Procedure: TRANSURETHRAL RESECTION OF BLADDER TUMOR (TURBT);  Surgeon: Hollice Espy, MD;  Location: [facility] ORS;  Service: Urology;  Laterality: N/A;  . Cystoscopy w/ retrogrades Bilateral 09/29/2014    Procedure: CYSTOSCOPY WITH RETROGRADE PYELOGRAM;  Surgeon: Hollice Espy, MD;  Location: [facility] ORS;  Service: Urology;  Laterality: Bilateral;  . Eye surgery    . Cystoscopy with biopsy N/A 11/02/2014    Procedure: CYSTOSCOPY WITH BIOPSY/WITH MITOMYCIN;  Surgeon: Hollice Espy, MD;  Location: [facility] ORS;  Service: Urology;  Laterality:  N/A;  . Foot surgery Right   . Esophagogastroduodenoscopy (egd) with propofol N/A 05/10/2015    Procedure: ESOPHAGOGASTRODUODENOSCOPY (EGD) WITH PROPOFOL;  Surgeon: Lollie Sails, MD;  Location: [facility] ENDOSCOPY;  Service: Endoscopy;  Laterality: N/A;  . Shoulder arthroscopy with rotator cuff repair and subacromial decompression Right 05/25/2015    Procedure: SHOULDER ARTHROSCOPY WITH ROTATOR CUFF REPAIR AND SUBACROMIAL DECOMPRESSION, release long head biceps tendon;  Surgeon: Leanor Kail, MD;  Location:  [facility] ORS;  Service: Orthopedics;  Laterality: Right;  . Abdominal hysterectomy  1973    Family History  Problem Relation Age of Onset  . Heart attack Father   . Emphysema Father   . Diabetes type II Sister   . Heart disease Mother   . Hypertension Mother   . Kidney disease Neg Hx   . Bladder Cancer Neg Hx   . Breast cancer Neg Hx     Social History Social History  Substance Use Topics  . Smoking status: Never Smoker   . Smokeless tobacco: Never Used  . Alcohol Use: No    No Known Allergies  Current Outpatient Prescriptions  Medication Sig Dispense Refill  . acetaminophen (TYLENOL) 650 MG CR tablet Take 650 mg by mouth every 8 (eight) hours as needed for pain. Reported on 08/26/2015    . Ascorbic Acid (VITAMIN C) 1000 MG tablet Take 1,000 mg by mouth daily.    . calcium-vitamin D (OSCAL WITH D) 500-200 MG-UNIT per tablet Take 2 tablets by mouth 2 (two) times daily.     . carvedilol (COREG) 25 MG tablet Take 25 mg by mouth 2 (two) times daily with a meal.    . diltiazem (CARDIZEM CD) 240 MG 24 hr capsule Take 240 mg by mouth daily.    . ferrous sulfate 325 (65 FE) MG tablet Take 325 mg by mouth daily with breakfast.    . latanoprost (XALATAN) 0.005 % ophthalmic solution Place 1 drop into both eyes at bedtime.    . pantoprazole (PROTONIX) 40 MG tablet   0  . pravastatin (PRAVACHOL) 40 MG tablet Take 40 mg by mouth daily at 6 PM.    . raloxifene (EVISTA) 60 MG tablet Take  60 mg by mouth daily.     Current Facility-Administered Medications  Medication Dose Route Frequency Provider Last Rate Last Dose  . BCG vaccine (THERACYS) injection 81 mg  81 mg Bladder Instillation Once Roda Shutters, FNP      . sodium chloride flush (NS) 0.9 % injection 50 mL  50 mL Intracatheter Once Nori Riis, PA-C        Review of Systems Review of Systems  Constitutional: Negative.   Respiratory: Negative.   Cardiovascular: Negative.     Blood pressure 142/76, pulse 68, resp. rate 12, height _0  (1.626 m), weight 140 lb (63.504 kg).  Physical Exam Physical Exam  Constitutional: She is oriented to person, place, and time. She appears well-developed and well-nourished.  Eyes: Conjunctivae are normal.  Neurological: She is alert and oriented to person, place, and time.  Skin: Skin is warm and dry.     The catheter drainage has purulent appearance thick yellow-ish white. Appears to be draining well.  Data Reviewed CT from the procedure Lab so far there is no bacteria identified on gram stain and the culture has not grown anything. This is consistent with a sterile abscess.  Assessment    Likely a chronic sterile abscess in the right gluteal area. Drain is functioning well. Will assess the adaquecy of drainage on CT    Plan    Patient has been scheduled for a CT abdomen/pelvis with contrast at [place] for 09-26-15 at 1 pm (arrive 12:45 pm). Prep: no solids 4 hours prior but patient may have clear liquids up until exam time and pick up prep kit. Patient verbalizes understanding.      PCP: [name] This information has been scribed by Gaspar Cola CMA.  SANKAR,SEEPLAPUTHUR G 09/20/2015, 11:40 AM

## 2015-09-19 NOTE — Patient Instructions (Addendum)
Measure your drainage when you empty the bulb. CT scan to be scheduled for next week. Follow up here afterward.   Patient has been scheduled for a CT abdomen/pelvis with contrast at [place] for 09-26-15 at 1 pm (arrive 12:45 pm). Prep: no solids 4 hours prior but patient may have clear liquids up until exam time and pick up prep kit. Patient verbalizes understanding.

## 2015-09-20 ENCOUNTER — Encounter: Payer: Self-pay | Admitting: General Surgery

## 2015-09-26 ENCOUNTER — Ambulatory Visit
Admission: RE | Admit: 2015-09-26 | Discharge: 2015-09-26 | Disposition: A | Discharge status: Home / Self Care | Payer: Medicare Other | Source: Ambulatory Visit | Attending: General Surgery | Admitting: General Surgery

## 2015-09-26 DIAGNOSIS — L0231 Cutaneous abscess of buttock: Secondary | ICD-10-CM

## 2015-09-26 DIAGNOSIS — Z9889 Other specified postprocedural states: Secondary | ICD-10-CM | POA: Insufficient documentation

## 2015-09-26 DIAGNOSIS — Z8551 Personal history of malignant neoplasm of bladder: Secondary | ICD-10-CM | POA: Diagnosis not present

## 2015-09-26 DIAGNOSIS — R599 Enlarged lymph nodes, unspecified: Secondary | ICD-10-CM | POA: Insufficient documentation

## 2015-09-26 MED ORDER — IOPAMIDOL (ISOVUE-300) INJECTION 61%
85.0000 mL | Freq: Once | INTRAVENOUS | Status: AC | PRN
  Administered 2015-09-26: 85 mL via INTRAVENOUS

## 2015-09-28 ENCOUNTER — Encounter: Payer: Self-pay | Admitting: General Surgery

## 2015-09-28 ENCOUNTER — Ambulatory Visit (INDEPENDENT_AMBULATORY_CARE_PROVIDER_SITE_OTHER): Payer: Medicare Other | Admitting: General Surgery

## 2015-09-28 VITALS — BP 160/88 | HR 78 | Resp 12 | Ht 61.0 in | Wt 142.0 lb

## 2015-09-28 DIAGNOSIS — L0231 Cutaneous abscess of buttock: Secondary | ICD-10-CM

## 2015-09-28 NOTE — Patient Instructions (Signed)
Patient to return in one week. Keep track of drain sheet.

## 2015-09-28 NOTE — Progress Notes (Signed)
Patient ID: [name], female   DOB: [birth year], 82 y.o.   MRN: [id number] Patient here today to follow up CT done on 09/26/15. She has her drain sheet today. I have reviewed the history of present illness with the patient.  Right gluteal area with palpable mass.  Area still draining approx.12-35mL a day.  CT showed almost full resolution of the fluid collection Patient to return next week for reevaluation-hopefully remove drain at that time    This information has been scribed by Verlene Mayer, CMA   PCP: Dr. Ouida Sills

## 2015-10-06 ENCOUNTER — Encounter: Payer: Self-pay | Admitting: General Surgery

## 2015-10-06 ENCOUNTER — Ambulatory Visit (INDEPENDENT_AMBULATORY_CARE_PROVIDER_SITE_OTHER): Payer: Medicare Other | Admitting: General Surgery

## 2015-10-06 VITALS — BP 120/62 | HR 72 | Resp 12 | Ht 61.0 in | Wt 141.0 lb

## 2015-10-06 DIAGNOSIS — L0231 Cutaneous abscess of buttock: Secondary | ICD-10-CM | POA: Diagnosis not present

## 2015-10-06 NOTE — Progress Notes (Signed)
This is a 82 year old female here today for her follow up gluteal abscess. Drain sheet present.  I have reviewed the history of present illness with the patient.  The prior absces/mass in rt gluteal area is fully resolved. Drainage has been less than 40ml per day-appears it is mostly the flush she has been using. Drain removed. Patient to return in two weeks.      PCP:  Ouida Sills This information has been scribed by Gaspar Cola CMA.

## 2015-10-06 NOTE — Patient Instructions (Signed)
Return in two weeks.  

## 2015-10-20 ENCOUNTER — Ambulatory Visit (INDEPENDENT_AMBULATORY_CARE_PROVIDER_SITE_OTHER): Payer: Medicare Other | Admitting: General Surgery

## 2015-10-20 ENCOUNTER — Encounter: Payer: Self-pay | Admitting: General Surgery

## 2015-10-20 VITALS — BP 142/68 | HR 74 | Resp 16 | Ht 61.0 in | Wt 143.0 lb

## 2015-10-20 DIAGNOSIS — L0231 Cutaneous abscess of buttock: Secondary | ICD-10-CM

## 2015-10-20 NOTE — Progress Notes (Signed)
Patient ID: [name], female   DOB: 1932/12/02, 82 y.o.   MRN: [id number]  Chief Complaint  Patient presents with  . Follow-up    abscess    HPI [name] is a 82 y.o. female.  Here today for follow up right gluteal abscess. She states the area started swelling last week and is tender. I have reviewed the history of present illness with the patient.   HPI  Past Medical History  Diagnosis Date  . Hypertension   . Anemia   . DDD (degenerative disc disease), cervical   . DDD (degenerative disc disease), lumbar   . Elevated lipids   . Osteoporosis   . Glaucoma   . Multiple gastric ulcers   . Foot pain   . Colon polyp   . Chronic low back pain   . Carotid arterial disease ([place]) 01/09/2014    Overview:  Minimal on screening   . Cancer [facility]) 2016    bladder tumor    Past Surgical History  Procedure Laterality Date  . Back surgery      x 6  . Carpal tunnel release Right   . Wrist fracture surgery Left   . Knee arthroscopy Right     x2  . Mandible surgery Bilateral     x2  . Cataract extraction Bilateral   . Transurethral resection of bladder tumor N/A 09/29/2014    Procedure: TRANSURETHRAL RESECTION OF BLADDER TUMOR (TURBT);  Surgeon: Hollice Espy, MD;  Location: [facility] ORS;  Service: Urology;  Laterality: N/A;  . Cystoscopy w/ retrogrades Bilateral 09/29/2014    Procedure: CYSTOSCOPY WITH RETROGRADE PYELOGRAM;  Surgeon: Hollice Espy, MD;  Location: [facility] ORS;  Service: Urology;  Laterality: Bilateral;  . Eye surgery    . Cystoscopy with biopsy N/A 11/02/2014    Procedure: CYSTOSCOPY WITH BIOPSY/WITH MITOMYCIN;  Surgeon: Hollice Espy, MD;  Location: [facility] ORS;  Service: Urology;  Laterality: N/A;  . Foot surgery Right   . Esophagogastroduodenoscopy (egd) with propofol N/A 05/10/2015    Procedure: ESOPHAGOGASTRODUODENOSCOPY (EGD) WITH PROPOFOL;  Surgeon: Lollie Sails, MD;  Location: [facility] ENDOSCOPY;  Service: Endoscopy;  Laterality: N/A;  . Shoulder  arthroscopy with rotator cuff repair and subacromial decompression Right 05/25/2015    Procedure: SHOULDER ARTHROSCOPY WITH ROTATOR CUFF REPAIR AND SUBACROMIAL DECOMPRESSION, release long head biceps tendon;  Surgeon: Leanor Kail, MD;  Location: [facility] ORS;  Service: Orthopedics;  Laterality: Right;  . Abdominal hysterectomy  1973    Family History  Problem Relation Age of Onset  . Heart attack Father   . Emphysema Father   . Diabetes type II Sister   . Heart disease Mother   . Hypertension Mother   . Kidney disease Neg Hx   . Bladder Cancer Neg Hx   . Breast cancer Neg Hx     Social History Social History  Substance Use Topics  . Smoking status: Never Smoker   . Smokeless tobacco: Never Used  . Alcohol Use: No    No Known Allergies  Current Outpatient Prescriptions  Medication Sig Dispense Refill  . acetaminophen (TYLENOL) 650 MG CR tablet Take 650 mg by mouth every 8 (eight) hours as needed for pain. Reported on 08/26/2015    . Ascorbic Acid (VITAMIN C) 1000 MG tablet Take 1,000 mg by mouth daily.    . calcium-vitamin D (OSCAL WITH D) 500-200 MG-UNIT per tablet Take 2 tablets by mouth 2 (two) times daily.     . carvedilol (COREG) 25 MG tablet Take  25 mg by mouth 2 (two) times daily with a meal.    . diltiazem (CARDIZEM CD) 240 MG 24 hr capsule Take 240 mg by mouth daily.    . ferrous sulfate 325 (65 FE) MG tablet Take 325 mg by mouth daily with breakfast.    . latanoprost (XALATAN) 0.005 % ophthalmic solution Place 1 drop into both eyes at bedtime.    . pantoprazole (PROTONIX) 40 MG tablet   0  . pravastatin (PRAVACHOL) 40 MG tablet Take 40 mg by mouth daily at 6 PM.    . raloxifene (EVISTA) 60 MG tablet Take 60 mg by mouth daily.     Current Facility-Administered Medications  Medication Dose Route Frequency Provider Last Rate Last Dose  . BCG vaccine (THERACYS) injection 81 mg  81 mg Bladder Instillation Once Roda Shutters, FNP      . sodium chloride flush (NS) 0.9  % injection 50 mL  50 mL Intracatheter Once Nori Riis, PA-C        Review of Systems Review of Systems  Constitutional: Negative.   Respiratory: Negative.   Cardiovascular: Negative.     Blood pressure 142/68, pulse 74, resp. rate 16, height 5\' 1"  (1.549 m), weight 143 lb (64.864 kg).  Physical Exam Physical Exam  Constitutional: She is oriented to person, place, and time. She appears well-developed and well-nourished.  HENT:  Mouth/Throat: Oropharynx is clear and moist.  Eyes: Conjunctivae are normal. No scleral icterus.  Neck: Neck supple.  Cardiovascular: Normal rate and regular rhythm.   Murmur heard.  Systolic murmur is present with a grade of 3/6  Pulmonary/Chest: Effort normal and breath sounds normal.  Abdominal: Soft. There is no tenderness.  Musculoskeletal:  Recurrent large right gluteal abscess. Tender 10cm area with firm  Tissue, mildly fluctuant  Lymphadenopathy:    She has no cervical adenopathy.       Right: No inguinal adenopathy present.       Left: No inguinal adenopathy present.  Neurological: She is alert and oriented to person, place, and time.  Skin: Skin is warm and dry.  Psychiatric: Her behavior is normal.    Data Reviewed Prior notes  Assessment    Recurrent largeright gluteal abscess.    Plan    Drainage in the operating room, discussed fully with the patient and she agrees. This will be scheduled for tomorrow, 10-21-15 at [facility].       PCP:  [name] This information has been scribed by Karie Fetch RN, BSN,BC.   Mirage Pfefferkorn G 10/20/2015, 3:53 PM

## 2015-10-20 NOTE — Patient Instructions (Addendum)
The patient is aware to call back for any questions or concerns. Drainage in the operating room

## 2015-10-21 ENCOUNTER — Ambulatory Visit: Payer: Medicare Other | Admitting: Anesthesiology

## 2015-10-21 ENCOUNTER — Encounter: Payer: Self-pay | Admitting: *Deleted

## 2015-10-21 ENCOUNTER — Ambulatory Visit
Admission: RE | Admit: 2015-10-21 | Discharge: 2015-10-21 | Disposition: A | Discharge status: Home / Self Care | Payer: Medicare Other | Source: Ambulatory Visit | Attending: General Surgery | Admitting: General Surgery

## 2015-10-21 ENCOUNTER — Encounter
Admission: RE | Disposition: A | Discharge status: Home / Self Care | Payer: Self-pay | Source: Ambulatory Visit | Attending: General Surgery

## 2015-10-21 DIAGNOSIS — Z9842 Cataract extraction status, left eye: Secondary | ICD-10-CM | POA: Diagnosis not present

## 2015-10-21 DIAGNOSIS — Z8249 Family history of ischemic heart disease and other diseases of the circulatory system: Secondary | ICD-10-CM | POA: Insufficient documentation

## 2015-10-21 DIAGNOSIS — M5030 Other cervical disc degeneration, unspecified cervical region: Secondary | ICD-10-CM | POA: Insufficient documentation

## 2015-10-21 DIAGNOSIS — M5136 Other intervertebral disc degeneration, lumbar region: Secondary | ICD-10-CM | POA: Diagnosis not present

## 2015-10-21 DIAGNOSIS — H409 Unspecified glaucoma: Secondary | ICD-10-CM | POA: Diagnosis not present

## 2015-10-21 DIAGNOSIS — I10 Essential (primary) hypertension: Secondary | ICD-10-CM | POA: Diagnosis not present

## 2015-10-21 DIAGNOSIS — M1990 Unspecified osteoarthritis, unspecified site: Secondary | ICD-10-CM | POA: Diagnosis not present

## 2015-10-21 DIAGNOSIS — Z8711 Personal history of peptic ulcer disease: Secondary | ICD-10-CM | POA: Insufficient documentation

## 2015-10-21 DIAGNOSIS — I7789 Other specified disorders of arteries and arterioles: Secondary | ICD-10-CM | POA: Insufficient documentation

## 2015-10-21 DIAGNOSIS — G8929 Other chronic pain: Secondary | ICD-10-CM | POA: Diagnosis not present

## 2015-10-21 DIAGNOSIS — Z833 Family history of diabetes mellitus: Secondary | ICD-10-CM | POA: Diagnosis not present

## 2015-10-21 DIAGNOSIS — Z8551 Personal history of malignant neoplasm of bladder: Secondary | ICD-10-CM | POA: Diagnosis not present

## 2015-10-21 DIAGNOSIS — Z90710 Acquired absence of both cervix and uterus: Secondary | ICD-10-CM | POA: Insufficient documentation

## 2015-10-21 DIAGNOSIS — M545 Low back pain: Secondary | ICD-10-CM | POA: Diagnosis not present

## 2015-10-21 DIAGNOSIS — L0231 Cutaneous abscess of buttock: Secondary | ICD-10-CM | POA: Diagnosis not present

## 2015-10-21 DIAGNOSIS — Z86010 Personal history of colonic polyps: Secondary | ICD-10-CM | POA: Insufficient documentation

## 2015-10-21 DIAGNOSIS — M810 Age-related osteoporosis without current pathological fracture: Secondary | ICD-10-CM | POA: Diagnosis not present

## 2015-10-21 DIAGNOSIS — Z9841 Cataract extraction status, right eye: Secondary | ICD-10-CM | POA: Diagnosis not present

## 2015-10-21 DIAGNOSIS — Z825 Family history of asthma and other chronic lower respiratory diseases: Secondary | ICD-10-CM | POA: Insufficient documentation

## 2015-10-21 HISTORY — PX: IRRIGATION AND DEBRIDEMENT BUTTOCKS: SHX6601

## 2015-10-21 LAB — CBC WITH DIFFERENTIAL/PLATELET
Basophils Absolute: 0 10*3/uL (ref 0–0.1)
EOS ABS: 0.1 10*3/uL (ref 0–0.7)
HCT: 27 % — ABNORMAL LOW (ref 35.0–47.0)
HEMOGLOBIN: 8.7 g/dL — AB (ref 12.0–16.0)
Lymphocytes Relative: 19 %
Lymphs Abs: 1.6 10*3/uL (ref 1.0–3.6)
MCH: 26.1 pg (ref 26.0–34.0)
MCHC: 32.3 g/dL (ref 32.0–36.0)
MCV: 80.6 fL (ref 80.0–100.0)
Monocytes Absolute: 0.7 10*3/uL (ref 0.2–0.9)
Neutro Abs: 5.9 10*3/uL (ref 1.4–6.5)
PLATELETS: 475 10*3/uL — AB (ref 150–440)
RBC: 3.35 MIL/uL — ABNORMAL LOW (ref 3.80–5.20)
RDW: 19.1 % — AB (ref 11.5–14.5)
WBC: 8.3 10*3/uL (ref 3.6–11.0)

## 2015-10-21 SURGERY — IRRIGATION AND DEBRIDEMENT BUTTOCKS
Anesthesia: General | Laterality: Right

## 2015-10-21 MED ORDER — ROCURONIUM BROMIDE 100 MG/10ML IV SOLN
INTRAVENOUS | Status: DC | PRN
  Administered 2015-10-21: 5 mg via INTRAVENOUS

## 2015-10-21 MED ORDER — OXYCODONE HCL 5 MG PO TABS: 5.0000 mg | ORAL_TABLET | Freq: Once | ORAL | Status: DC | PRN

## 2015-10-21 MED ORDER — SUCCINYLCHOLINE CHLORIDE 20 MG/ML IJ SOLN
INTRAMUSCULAR | Status: DC | PRN
  Administered 2015-10-21: 100 mg via INTRAVENOUS

## 2015-10-21 MED ORDER — OXYCODONE HCL 5 MG/5ML PO SOLN: 5.0000 mg | Freq: Once | ORAL | Status: DC | PRN

## 2015-10-21 MED ORDER — PROPOFOL 10 MG/ML IV BOLUS
INTRAVENOUS | Status: DC | PRN
  Administered 2015-10-21: 30 mg via INTRAVENOUS
  Administered 2015-10-21: 120 mg via INTRAVENOUS
  Administered 2015-10-21: 10 mg via INTRAVENOUS

## 2015-10-21 MED ORDER — DOXYCYCLINE HYCLATE 100 MG PO TABS: 100.0000 mg | ORAL_TABLET | Freq: Two times a day (BID) | ORAL | Status: DC

## 2015-10-21 MED ORDER — LIDOCAINE HCL (CARDIAC) 20 MG/ML IV SOLN
INTRAVENOUS | Status: DC | PRN
  Administered 2015-10-21: 60 mg via INTRAVENOUS

## 2015-10-21 MED ORDER — FENTANYL CITRATE (PF) 100 MCG/2ML IJ SOLN
INTRAMUSCULAR | Status: AC
  Administered 2015-10-21: 25 ug via INTRAVENOUS
  Filled 2015-10-21: qty 2

## 2015-10-21 MED ORDER — FENTANYL CITRATE (PF) 100 MCG/2ML IJ SOLN
25.0000 ug | INTRAMUSCULAR | Status: DC | PRN
  Administered 2015-10-21 (×4): 25 ug via INTRAVENOUS

## 2015-10-21 MED ORDER — CHLORHEXIDINE GLUCONATE 4 % EX LIQD: 1.0000 "application " | Freq: Once | CUTANEOUS | Status: DC

## 2015-10-21 MED ORDER — MIDAZOLAM HCL 2 MG/2ML IJ SOLN
INTRAMUSCULAR | Status: DC | PRN
  Administered 2015-10-21: 1 mg via INTRAVENOUS

## 2015-10-21 MED ORDER — CEFAZOLIN SODIUM-DEXTROSE 2-4 GM/100ML-% IV SOLN
2.0000 g | INTRAVENOUS | Status: AC
  Administered 2015-10-21: 2 g via INTRAVENOUS

## 2015-10-21 MED ORDER — CEFAZOLIN SODIUM-DEXTROSE 2-4 GM/100ML-% IV SOLN
INTRAVENOUS | Status: AC
  Filled 2015-10-21: qty 100

## 2015-10-21 MED ORDER — SUGAMMADEX SODIUM 200 MG/2ML IV SOLN
INTRAVENOUS | Status: DC | PRN
  Administered 2015-10-21: 140 mg via INTRAVENOUS

## 2015-10-21 MED ORDER — FENTANYL CITRATE (PF) 100 MCG/2ML IJ SOLN
INTRAMUSCULAR | Status: DC | PRN
  Administered 2015-10-21 (×2): 50 ug via INTRAVENOUS

## 2015-10-21 MED ORDER — LACTATED RINGERS IV SOLN
INTRAVENOUS | Status: DC | PRN
  Administered 2015-10-21: 08:00:00 via INTRAVENOUS

## 2015-10-21 MED ORDER — TRAMADOL HCL 50 MG PO TABS: 50.0000 mg | ORAL_TABLET | Freq: Four times a day (QID) | ORAL | Status: DC | PRN

## 2015-10-21 MED ORDER — ONDANSETRON HCL 4 MG/2ML IJ SOLN
INTRAMUSCULAR | Status: DC | PRN
  Administered 2015-10-21: 4 mg via INTRAVENOUS

## 2015-10-21 MED ORDER — BUPIVACAINE-EPINEPHRINE (PF) 0.5% -1:200000 IJ SOLN
INTRAMUSCULAR | Status: AC
  Filled 2015-10-21: qty 30

## 2015-10-21 SURGICAL SUPPLY — 25 items
BLADE CLIPPER SURG (BLADE) ×1 IMPLANT
CANISTER SUCT 1200ML W/VALVE (MISCELLANEOUS) ×3 IMPLANT
CHLORAPREP W/TINT 26ML (MISCELLANEOUS) ×3 IMPLANT
DRAPE LAPAROTOMY 100X77 ABD (DRAPES) ×3 IMPLANT
DRSG TEGADERM 4X4.75 (GAUZE/BANDAGES/DRESSINGS) ×2 IMPLANT
ELECT REM PT RETURN 9FT ADLT (ELECTROSURGICAL) ×3
ELECTRODE REM PT RTRN 9FT ADLT (ELECTROSURGICAL) ×1 IMPLANT
GAUZE SPONGE NON-WVN 2X2 STRL (MISCELLANEOUS) IMPLANT
GLOVE BIO SURGEON STRL SZ7.5 (GLOVE) ×3 IMPLANT
GOWN STRL REUS W/ TWL LRG LVL3 (GOWN DISPOSABLE) ×2 IMPLANT
GOWN STRL REUS W/TWL LRG LVL3 (GOWN DISPOSABLE) ×6
KIT RM TURNOVER STRD PROC AR (KITS) ×3 IMPLANT
LABEL OR SOLS (LABEL) ×1 IMPLANT
NDL HYPO 25X1 1.5 SAFETY (NEEDLE) ×1 IMPLANT
NEEDLE HYPO 25X1 1.5 SAFETY (NEEDLE) ×3 IMPLANT
NS IRRIG 500ML POUR BTL (IV SOLUTION) ×3 IMPLANT
PACK BASIN MINOR ARMC (MISCELLANEOUS) ×3 IMPLANT
SPONGE VERSALON 2X2 STRL (MISCELLANEOUS) ×3
SUT ETHILON 3-0 FS-10 30 BLK (SUTURE) ×3
SUT VIC AB 3-0 SH 27 (SUTURE) ×3
SUT VIC AB 3-0 SH 27X BRD (SUTURE) ×1 IMPLANT
SUTURE EHLN 3-0 FS-10 30 BLK (SUTURE) ×1 IMPLANT
SWABSTK COMLB BENZOIN TINCTURE (MISCELLANEOUS) ×2 IMPLANT
SYRINGE 10CC LL (SYRINGE) ×3 IMPLANT
TAPE ADH 3 LX (MISCELLANEOUS) ×1 IMPLANT

## 2015-10-21 NOTE — H&P (View-Only) (Signed)
Patient ID: [name], female   DOB: 1932/12/02, 82 y.o.   MRN: [id number]  Chief Complaint  Patient presents with  . Follow-up    abscess    HPI [name] is a 82 y.o. female.  Here today for follow up right gluteal abscess. She states the area started swelling last week and is tender. I have reviewed the history of present illness with the patient.   HPI  Past Medical History  Diagnosis Date  . Hypertension   . Anemia   . DDD (degenerative disc disease), cervical   . DDD (degenerative disc disease), lumbar   . Elevated lipids   . Osteoporosis   . Glaucoma   . Multiple gastric ulcers   . Foot pain   . Colon polyp   . Chronic low back pain   . Carotid arterial disease ([place]) 01/09/2014    Overview:  Minimal on screening   . Cancer [facility]) 2016    bladder tumor    Past Surgical History  Procedure Laterality Date  . Back surgery      x 6  . Carpal tunnel release Right   . Wrist fracture surgery Left   . Knee arthroscopy Right     x2  . Mandible surgery Bilateral     x2  . Cataract extraction Bilateral   . Transurethral resection of bladder tumor N/A 09/29/2014    Procedure: TRANSURETHRAL RESECTION OF BLADDER TUMOR (TURBT);  Surgeon: Hollice Espy, MD;  Location: [facility] ORS;  Service: Urology;  Laterality: N/A;  . Cystoscopy w/ retrogrades Bilateral 09/29/2014    Procedure: CYSTOSCOPY WITH RETROGRADE PYELOGRAM;  Surgeon: Hollice Espy, MD;  Location: [facility] ORS;  Service: Urology;  Laterality: Bilateral;  . Eye surgery    . Cystoscopy with biopsy N/A 11/02/2014    Procedure: CYSTOSCOPY WITH BIOPSY/WITH MITOMYCIN;  Surgeon: Hollice Espy, MD;  Location: [facility] ORS;  Service: Urology;  Laterality: N/A;  . Foot surgery Right   . Esophagogastroduodenoscopy (egd) with propofol N/A 05/10/2015    Procedure: ESOPHAGOGASTRODUODENOSCOPY (EGD) WITH PROPOFOL;  Surgeon: Lollie Sails, MD;  Location: [facility] ENDOSCOPY;  Service: Endoscopy;  Laterality: N/A;  . Shoulder  arthroscopy with rotator cuff repair and subacromial decompression Right 05/25/2015    Procedure: SHOULDER ARTHROSCOPY WITH ROTATOR CUFF REPAIR AND SUBACROMIAL DECOMPRESSION, release long head biceps tendon;  Surgeon: Leanor Kail, MD;  Location: [facility] ORS;  Service: Orthopedics;  Laterality: Right;  . Abdominal hysterectomy  1973    Family History  Problem Relation Age of Onset  . Heart attack Father   . Emphysema Father   . Diabetes type II Sister   . Heart disease Mother   . Hypertension Mother   . Kidney disease Neg Hx   . Bladder Cancer Neg Hx   . Breast cancer Neg Hx     Social History Social History  Substance Use Topics  . Smoking status: Never Smoker   . Smokeless tobacco: Never Used  . Alcohol Use: No    No Known Allergies  Current Outpatient Prescriptions  Medication Sig Dispense Refill  . acetaminophen (TYLENOL) 650 MG CR tablet Take 650 mg by mouth every 8 (eight) hours as needed for pain. Reported on 08/26/2015    . Ascorbic Acid (VITAMIN C) 1000 MG tablet Take 1,000 mg by mouth daily.    . calcium-vitamin D (OSCAL WITH D) 500-200 MG-UNIT per tablet Take 2 tablets by mouth 2 (two) times daily.     . carvedilol (COREG) 25 MG tablet Take  25 mg by mouth 2 (two) times daily with a meal.    . diltiazem (CARDIZEM CD) 240 MG 24 hr capsule Take 240 mg by mouth daily.    . ferrous sulfate 325 (65 FE) MG tablet Take 325 mg by mouth daily with breakfast.    . latanoprost (XALATAN) 0.005 % ophthalmic solution Place 1 drop into both eyes at bedtime.    . pantoprazole (PROTONIX) 40 MG tablet   0  . pravastatin (PRAVACHOL) 40 MG tablet Take 40 mg by mouth daily at 6 PM.    . raloxifene (EVISTA) 60 MG tablet Take 60 mg by mouth daily.     Current Facility-Administered Medications  Medication Dose Route Frequency Provider Last Rate Last Dose  . BCG vaccine (THERACYS) injection 81 mg  81 mg Bladder Instillation Once Roda Shutters, FNP      . sodium chloride flush (NS) 0.9  % injection 50 mL  50 mL Intracatheter Once Nori Riis, PA-C        Review of Systems Review of Systems  Constitutional: Negative.   Respiratory: Negative.   Cardiovascular: Negative.     Blood pressure 142/68, pulse 74, resp. rate 16, height 5\' 1"  (1.549 m), weight 143 lb (64.864 kg).  Physical Exam Physical Exam  Constitutional: She is oriented to person, place, and time. She appears well-developed and well-nourished.  HENT:  Mouth/Throat: Oropharynx is clear and moist.  Eyes: Conjunctivae are normal. No scleral icterus.  Neck: Neck supple.  Cardiovascular: Normal rate and regular rhythm.   Murmur heard.  Systolic murmur is present with a grade of 3/6  Pulmonary/Chest: Effort normal and breath sounds normal.  Abdominal: Soft. There is no tenderness.  Musculoskeletal:  Recurrent large right gluteal abscess. Tender 10cm area with firm  Tissue, mildly fluctuant  Lymphadenopathy:    She has no cervical adenopathy.       Right: No inguinal adenopathy present.       Left: No inguinal adenopathy present.  Neurological: She is alert and oriented to person, place, and time.  Skin: Skin is warm and dry.  Psychiatric: Her behavior is normal.    Data Reviewed Prior notes  Assessment    Recurrent largeright gluteal abscess.    Plan    Drainage in the operating room, discussed fully with the patient and she agrees. This will be scheduled for tomorrow, 10-21-15 at [facility].       PCP:  [name] This information has been scribed by Karie Fetch RN, BSN,BC.   Shams Fill G 10/20/2015, 3:53 PM

## 2015-10-21 NOTE — Progress Notes (Signed)
Lab tech in for cbc w/diff draw @ [phone]

## 2015-10-21 NOTE — Interval H&P Note (Signed)
History and Physical Interval Note:  10/21/2015 8:16 AM  [name]  has presented today for surgery, with the diagnosis of RIGHT GLUTEAL ABSCESS  The various methods of treatment have been discussed with the patient and family. After consideration of risks, benefits and other options for treatment, the patient has consented to  Procedure(s): DRAINAGE GLUTEAL ABSCESS (Right) as a surgical intervention .  The patient's history has been reviewed, patient examined, no change in status, stable for surgery.  I have reviewed the patient's chart and labs.  Questions were answered to the patient's satisfaction.     SANKAR,SEEPLAPUTHUR G

## 2015-10-21 NOTE — Anesthesia Postprocedure Evaluation (Signed)
Anesthesia Post Note  Patient: [name]  Procedure(s) Performed: Procedure(s) (LRB): DRAINAGE GLUTEAL ABSCESS (Right)  Patient location during evaluation: PACU Anesthesia Type: General Level of consciousness: awake and alert Pain management: pain level controlled Vital Signs Assessment: post-procedure vital signs reviewed and stable Respiratory status: spontaneous breathing, nonlabored ventilation, respiratory function stable and patient connected to nasal cannula oxygen Cardiovascular status: blood pressure returned to baseline and stable Postop Assessment: no signs of nausea or vomiting Anesthetic complications: no    Last Vitals:  Filed Vitals:   10/21/15 0957 10/21/15 1012  BP: 149/69 139/66  Pulse: 63 62  Temp:  36.2 C  Resp: 14 17    Last Pain:  Filed Vitals:   10/21/15 1017  PainSc: 4                  Ovie Eastep K Jabar Krysiak

## 2015-10-21 NOTE — Progress Notes (Signed)
CRNA in to see pt preop - advises IV patent

## 2015-10-21 NOTE — Op Note (Signed)
Preop diagnosis: Right gluteal area deep-seated abscess  Post op diagnosis: Same  Operation: Drainage of right gluteal abscess deep  Surgeon: Mckinley Jewel  Assistant:     Anesthesia: Gen.  Complications: None  EBL: minimal  Drains: 19-gauge JP drain  Description: Patient was put to sleep and then placed in the left lateral position held in place with a beanbag. The upper anterior aspect of the right gluteal region that was approximately 8-10 cm sized abscess which was deep-seated. The previously she had it drained percutaneously with CT guidance and appeared to have resolved but only to recur after about the 2 week.. Cultures from the posterior was drained initially had no growth suggesting that this was as sterile abscess or some type. The abscess seemed to track over the iliac crest area towards the back muscles but did not seem to involve any other structures of concern within the retroperitoneum. The abscess area was prepped and draped as sterile field and timeout performed. A 1/2 cm incision was made at the most the fluctuant part in the middle to the inferior portion of his abscess. The abscess pocket in the deep subcutaneous plane wasn't entered into and then had a large amount of whitish purulent material drained out - no odor. Suction device was used to drain this pocket died completely and break up any loculations and a little track going up over the iliac crest area. Saline was used to flush out the cavity completely. In 19-gauge JP drain was then positioned going into this pocket and tethered to the skin with 3-0 nylon stitch. Pus was sampled and sent for culture and sensitivity. Area was dressed with 2 x 2's and Tegaderm. Patient subsequently extubated and returned recovery room stable condition

## 2015-10-21 NOTE — Anesthesia Preprocedure Evaluation (Signed)
Anesthesia Evaluation  Patient identified by MRN, date of birth, ID band Patient awake    Reviewed: Allergy & Precautions, H&P , NPO status , Patient's Chart, lab work & pertinent test results  History of Anesthesia Complications Negative for: history of anesthetic complications  Airway Mallampati: III  TM Distance: <3 FB Neck ROM: limited    Dental  (+) Poor Dentition   Pulmonary neg pulmonary ROS, neg shortness of breath,    Pulmonary exam normal breath sounds clear to auscultation       Cardiovascular Exercise Tolerance: Good hypertension, (-) angina+ Peripheral Vascular Disease  (-) Past MI and (-) DOE Normal cardiovascular exam Rhythm:regular Rate:Normal     Neuro/Psych negative neurological ROS  negative psych ROS   GI/Hepatic Neg liver ROS, PUD,   Endo/Other  negative endocrine ROS  Renal/GU negative Renal ROS  negative genitourinary   Musculoskeletal  (+) Arthritis ,   Abdominal   Peds  Hematology negative hematology ROS (+)   Anesthesia Other Findings Past Medical History:   Hypertension                                                 Anemia                                                       DDD (degenerative disc disease), cervical                    DDD (degenerative disc disease), lumbar                      Elevated lipids                                              Osteoporosis                                                 Glaucoma                                                     Multiple gastric ulcers                                      Foot pain                                                    Colon polyp  Chronic low back pain                                        Carotid arterial disease ([place])                  01/09/2014      Comment:Overview:  Minimal on screening    Cancer ([place])                                    2016        Comment:bladder tumor  Past Surgical History:   BACK SURGERY                                                    Comment:x 6   CARPAL TUNNEL RELEASE                           Right              WRIST FRACTURE SURGERY                          Left              KNEE ARTHROSCOPY                                Right                Comment:x2   MANDIBLE SURGERY                                Bilateral                Comment:x2   CATARACT EXTRACTION                             Bilateral              TRANSURETHRAL RESECTION OF BLADDER TUMOR        N/A 09/29/2014       Comment:Procedure: TRANSURETHRAL RESECTION OF BLADDER               TUMOR (TURBT);  Surgeon: Hollice Espy, MD;                Location: [facility] ORS;  Service: Urology;                Laterality: N/A;   CYSTOSCOPY W/ RETROGRADES                       Bilateral 09/29/2014       Comment:Procedure: CYSTOSCOPY WITH RETROGRADE               PYELOGRAM;  Surgeon: Hollice Espy, MD;                Location: [facility] ORS;  Service: Urology;  Laterality: Bilateral;   EYE SURGERY                                                   CYSTOSCOPY WITH BIOPSY                          N/A 11/02/2014       Comment:Procedure: CYSTOSCOPY WITH BIOPSY/WITH               MITOMYCIN;  Surgeon: Hollice Espy, MD;                Location: [facility] ORS;  Service: Urology;                Laterality: N/A;   FOOT SURGERY                                    Right              ESOPHAGOGASTRODUODENOSCOPY (EGD) WITH PROPOFOL  N/A 05/10/2015     Comment:Procedure: ESOPHAGOGASTRODUODENOSCOPY (EGD)               WITH PROPOFOL;  Surgeon: Lollie Sails, MD;              Location: [facility] ENDOSCOPY;  Service: Endoscopy;               Laterality: N/A;   SHOULDER ARTHROSCOPY WITH ROTATOR CUFF REPAIR * Right 05/25/2015     Comment:Procedure: SHOULDER ARTHROSCOPY WITH ROTATOR               CUFF REPAIR AND SUBACROMIAL DECOMPRESSION,               release long head biceps  tendon;  Surgeon:               Leanor Kail, MD;  Location: [facility] ORS;                Service: Orthopedics;  Laterality: Right;   ABDOMINAL HYSTERECTOMY                           1973        BMI    Body Mass Index   27.03 kg/m 2      Reproductive/Obstetrics negative OB ROS                             Anesthesia Physical Anesthesia Plan  ASA: III  Anesthesia Plan: General ETT   Post-op Pain Management:    Induction:   Airway Management Planned:   Additional Equipment:   Intra-op Plan:   Post-operative Plan:   Informed Consent: I have reviewed the patients History and Physical, chart, labs and discussed the procedure including the risks, benefits and alternatives for the proposed anesthesia with the patient or authorized representative who has indicated his/her understanding and acceptance.   Dental Advisory Given  Plan Discussed with: Anesthesiologist, CRNA and Surgeon  Anesthesia Plan Comments:         Anesthesia Quick Evaluation

## 2015-10-21 NOTE — Transfer of Care (Signed)
Immediate Anesthesia Transfer of Care Note  Patient: [name]  Procedure(s) Performed: Procedure(s): DRAINAGE GLUTEAL ABSCESS (Right)  Patient Location: PACU  Anesthesia Type:General  Level of Consciousness: sedated  Airway & Oxygen Therapy: Patient Spontanous Breathing and Patient connected to face mask oxygen  Post-op Assessment: Report given to RN and Post -op Vital signs reviewed and stable  Post vital signs: Reviewed and stable  Last Vitals:  Filed Vitals:   10/21/15 0731  BP: 152/77  Pulse: 61  Temp: 36 C  Resp: 16    Last Pain:  Filed Vitals:   10/21/15 0734  PainSc: 5          Complications: No apparent anesthesia complications

## 2015-10-21 NOTE — Discharge Instructions (Signed)
AMBULATORY SURGERY  DISCHARGE INSTRUCTIONS   1) The drugs that you were given will stay in your system until tomorrow so for the next 24 hours you should not:  A) Drive an automobile B) Make any legal decisions C) Drink any alcoholic beverage   2) You may resume regular meals tomorrow.  Today it is better to start with liquids and gradually work up to solid foods.  You may eat anything you prefer, but it is better to start with liquids, then soup and crackers, and gradually work up to solid foods.   3) Please notify your doctor immediately if you have any unusual bleeding, trouble breathing, redness and pain at the surgery site, drainage, fever, or pain not relieved by medication.    Additional Instructions:Take pain medicine as ordered. Drink plenty of fluids. Continue to empty and measure jp drain and take results to the doctors office upon return to physician.   Please contact your physician with any problems or Same Day Surgery at [phone], Monday through Friday 6 am to 4 pm, or  at [facility] number at [phone].  Percutaneous Abscess Drain, Care After Refer to this sheet in the next few weeks. These instructions provide you with information on caring for yourself after your procedure. Your health care provider may also give you more specific instructions. Your treatment has been planned according to current medical practices, but problems sometimes occur. Call your health care provider if you have any problems or questions after your procedure. WHAT TO EXPECT AFTER THE PROCEDURE After your procedure, it is typical to have the following:   A small amount of discomfort in the area where the drainage tube was placed.  A small amount of bruising around the area where the drainage tube was placed.  Sleepiness and fatigue for the rest of the day from the medicines used. HOME CARE INSTRUCTIONS  Rest at home for 1-2 days following your procedure or as directed  by your health care provider.  If you go home right after the procedure, plan to have someone with you for 24 hours.  Do not take a bathor shower for 24 hours after your procedure.  Take medicines only as directed by your health care provider. Ask your health care provider when you can resume taking any normal medicines.  Change bandages (dressings) as directed.   You may be told to record the amount of drainage from the bag every time you empty it. Follow your health care provider's directions for emptying the bag. Write down the amount of drainage, the date, and the time you emptied it.  Call your health care provider when the drain is putting out less than 10 mL of drainage per day for 2-3 days in a row or as directed by your health care provider.  Follow your health care provider's instructions for cleaning the drainage tube. You may need to clean the tube every day so that it does not clog. SEEK MEDICAL CARE IF:  You have increased bleeding (more than a small spot) from the site where the drainage tube was placed.  You have redness, swelling, or increasing pain around the site where the drainage tube was placed.  You notice a discharge or bad smell coming from the site where the drainage tube was placed.  You have a fever or chills.  You have pain that is not helped by medicine.  SEEK IMMEDIATE MEDICAL CARE IF:  There is leakage around the drainage tube.  The drainage tube  pulls out.  You suddenly stop having drainage from the tube.  You suddenly have blood in the drainage fluid.  You become dizzy or faint.  You develop a rash.   You have nausea or vomiting.  You have difficulty breathing, feel short of breath, or feel faint.   You develop chest pain.  You have problems with your speech or vision.  You have trouble balancing or moving your arms or legs.   This information is not intended to replace advice given to you by your health care provider. Make  sure you discuss any questions you have with your health care provider.   Document Released: 09/28/2013 Document Revised: 03/02/2014 Document Reviewed: 09/28/2013 Elsevier Interactive Patient Education [employer].

## 2015-10-21 NOTE — Anesthesia Procedure Notes (Signed)
Procedure Name: Intubation Date/Time: 10/21/2015 8:46 AM Performed by: Johnna Acosta Pre-anesthesia Checklist: Patient identified, Emergency Drugs available, Suction available, Patient being monitored and Timeout performed Patient Re-evaluated:Patient Re-evaluated prior to inductionOxygen Delivery Method: Circle system utilized Preoxygenation: Pre-oxygenation with 100% oxygen Intubation Type: IV induction Ventilation: Mask ventilation without difficulty Laryngoscope Size: Miller and 2 Grade View: Grade II Tube type: Oral Tube size: 7.0 mm Number of attempts: 1 Airway Equipment and Method: Stylet Placement Confirmation: ETT inserted through vocal cords under direct vision,  positive ETCO2 and breath sounds checked- equal and bilateral Secured at: 23 cm Tube secured with: Tape Dental Injury: Teeth and Oropharynx as per pre-operative assessment

## 2015-10-24 LAB — WOUND CULTURE: Culture: NO GROWTH

## 2015-10-25 LAB — ANAEROBIC CULTURE

## 2015-10-26 ENCOUNTER — Ambulatory Visit (INDEPENDENT_AMBULATORY_CARE_PROVIDER_SITE_OTHER): Payer: Medicare Other | Admitting: *Deleted

## 2015-10-26 DIAGNOSIS — L0231 Cutaneous abscess of buttock: Secondary | ICD-10-CM

## 2015-10-26 NOTE — Patient Instructions (Signed)
The patient is aware to call back for any questions or concerns.  

## 2015-10-26 NOTE — Progress Notes (Signed)
Patient came in today for a wound check/drain check.  The wound is clean, with no signs of infection noted. The drain is in place right gluteal area. Drainage is about 30-45 ml a day. Dressing changes. Follow up next week with MD.

## 2015-10-27 ENCOUNTER — Encounter: Payer: Self-pay | Admitting: *Deleted

## 2015-11-03 ENCOUNTER — Ambulatory Visit (INDEPENDENT_AMBULATORY_CARE_PROVIDER_SITE_OTHER): Payer: Medicare Other | Admitting: General Surgery

## 2015-11-03 ENCOUNTER — Encounter: Payer: Self-pay | Admitting: General Surgery

## 2015-11-03 VITALS — BP 128/64 | HR 74 | Resp 12 | Ht 61.0 in | Wt 140.0 lb

## 2015-11-03 DIAGNOSIS — L0231 Cutaneous abscess of buttock: Secondary | ICD-10-CM

## 2015-11-03 NOTE — Progress Notes (Signed)
Patient ID: [name], female   DOB: 1932/12/21, 82 y.o.   MRN: [id number]   Here today for follow up gluteal abscess. Drainage sheet present. No complaints, no pain. Drainage has been less than 10 mL per day for last 3-4 days.Thin serous fluid noted for last several days.  I have reviewed the history of present illness with the patient.  Procedure Drain Removal  Preparation: Alcohol pap  Description: After area cleaned, the anchoring stitch of the drain was cut with scissors and removed. The drain and catheter were removed in their entirety.     Drain removed. Follow up in 2 weeks. The patient is aware to call back for any questions or concerns.   PCP:  Ouida Sills This information has been scribed by Karie Fetch RN, BSN,BC.

## 2015-11-03 NOTE — Patient Instructions (Signed)
The patient is aware to call back for any questions or concerns.  

## 2015-11-16 ENCOUNTER — Ambulatory Visit (INDEPENDENT_AMBULATORY_CARE_PROVIDER_SITE_OTHER): Payer: Medicare Other | Admitting: General Surgery

## 2015-11-16 ENCOUNTER — Encounter: Payer: Self-pay | Admitting: General Surgery

## 2015-11-16 VITALS — Ht 61.0 in | Wt 141.0 lb

## 2015-11-16 DIAGNOSIS — L0231 Cutaneous abscess of buttock: Secondary | ICD-10-CM

## 2015-11-16 NOTE — Progress Notes (Signed)
Patient ID: [name], female   DOB: 1932/08/24, 82 y.o.   MRN: [id number]  Here for a post op follow up from gluteal abscess. Her drain was removed at her last visit. She reports no further drainage from the site. She denies any pain or discomfort.  I have reviewed the history of present illness with the patient.  No visible or palpable swelling in gluteal area.  Non-tender to palpation.   Incision healed appropriately.   Follow-up as needed.   This has been scribed by Lesly Rubenstein LPN PCP: [name]

## 2015-11-18 ENCOUNTER — Ambulatory Visit (INDEPENDENT_AMBULATORY_CARE_PROVIDER_SITE_OTHER): Payer: Medicare Other | Admitting: Urology

## 2015-11-18 ENCOUNTER — Encounter: Payer: Self-pay | Admitting: Urology

## 2015-11-18 VITALS — BP 126/69 | HR 77 | Ht 61.0 in | Wt 141.0 lb

## 2015-11-18 DIAGNOSIS — C679 Malignant neoplasm of bladder, unspecified: Secondary | ICD-10-CM

## 2015-11-18 DIAGNOSIS — R590 Localized enlarged lymph nodes: Secondary | ICD-10-CM

## 2015-11-18 LAB — URINALYSIS, COMPLETE
Bilirubin, UA: NEGATIVE
Glucose, UA: NEGATIVE
KETONES UA: NEGATIVE
NITRITE UA: NEGATIVE
PH UA: 5.5 (ref 5.0–7.5)
Specific Gravity, UA: 1.02 (ref 1.005–1.030)
Urobilinogen, Ur: 0.2 mg/dL (ref 0.2–1.0)

## 2015-11-18 LAB — MICROSCOPIC EXAMINATION: Bacteria, UA: NONE SEEN

## 2015-11-18 MED ORDER — CIPROFLOXACIN HCL 500 MG PO TABS
500.0000 mg | ORAL_TABLET | Freq: Once | ORAL | Status: AC
  Administered 2015-11-18: 500 mg via ORAL

## 2015-11-18 MED ORDER — LIDOCAINE HCL 2 % EX GEL
1.0000 "application " | Freq: Once | CUTANEOUS | Status: AC
  Administered 2015-11-18: 1 via URETHRAL

## 2015-11-18 NOTE — Progress Notes (Signed)
9:43 AM  11/18/2015   [name] 21-Jan-1933 [id number]  Referring provider: Kirk Ruths, MD [place] [facility] [place], [place] [address]  Chief Complaint  Patient presents with  . Cysto    bladder cancer    HPI: 82 year old nonsmoker with  incidental 11 mm papillary neoplasm near the RIGHT bladder neck. [name] was taken to the operating room on 09/29/2014 for TURBT, Bilateral retrograde pyelogramat which time a 2 cm extremely spherical mass was identified just proximal to the RIGHT UO. This was resected along with deeper biopsies of the base. Of note, bilateral retrograde pyelogram was negative for any obvious upper tract filling defects or hydronephrosis.   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 The tumor had an inverted architecture. Deeper biopsies did contain muscle after speaking with the pathologist today and the muscle was negative for any tumor. Additonally, CIS was noted.  [name] returned to the operating room on 11/02/2014 for restaging TURBT. Pathology showed no evidence of residual tumor.  [name] completed BCG x 6 for induction and first course of maintenence bcg x 3 doses  X 2.  Most recent maintainance dose completed in 07/2015.      Most recent CT abd/ pelvis showed mildly enlarging left pelvic lymph node to 8 mm from 6 mm.  This study was performed as further workup/surveillance of a gluteal abscess being treated by Dr. Jamal Collin.  [name] returns today for surveillance cystoscopy. [name] has now urinary complaints today.     PMH: Past Medical History  Diagnosis Date  . Hypertension   . Anemia   . DDD (degenerative disc disease), cervical   . DDD (degenerative disc disease), lumbar   . Elevated lipids   . Osteoporosis   . Glaucoma   . Multiple gastric ulcers   . Foot pain   . Colon polyp   . Chronic low back pain   . Carotid arterial disease ([place]) 01/09/2014    Overview:   Minimal on screening   . Cancer [facility]) 2016    bladder tumor    Surgical History: Past Surgical History  Procedure Laterality Date  . Back surgery      x 6  . Carpal tunnel release Right   . Wrist fracture surgery Left   . Knee arthroscopy Right     x2  . Mandible surgery Bilateral     x2  . Cataract extraction Bilateral   . Transurethral resection of bladder tumor N/A 09/29/2014    Procedure: TRANSURETHRAL RESECTION OF BLADDER TUMOR (TURBT);  Surgeon: Hollice Espy, MD;  Location: [facility] ORS;  Service: Urology;  Laterality: N/A;  . Cystoscopy w/ retrogrades Bilateral 09/29/2014    Procedure: CYSTOSCOPY WITH RETROGRADE PYELOGRAM;  Surgeon: Hollice Espy, MD;  Location: [facility] ORS;  Service: Urology;  Laterality: Bilateral;  . Eye surgery    . Cystoscopy with biopsy N/A 11/02/2014    Procedure: CYSTOSCOPY WITH BIOPSY/WITH MITOMYCIN;  Surgeon: Hollice Espy, MD;  Location: [facility] ORS;  Service: Urology;  Laterality: N/A;  . Foot surgery Right   . Esophagogastroduodenoscopy (egd) with propofol N/A 05/10/2015    Procedure: ESOPHAGOGASTRODUODENOSCOPY (EGD) WITH PROPOFOL;  Surgeon: Lollie Sails, MD;  Location: [facility] ENDOSCOPY;  Service: Endoscopy;  Laterality: N/A;  . Shoulder arthroscopy with rotator cuff repair and subacromial decompression Right 05/25/2015    Procedure: SHOULDER ARTHROSCOPY WITH ROTATOR CUFF REPAIR AND SUBACROMIAL DECOMPRESSION, release long head biceps tendon;  Surgeon: Leanor Kail, MD;  Location: [facility] ORS;  Service: Orthopedics;  Laterality: Right;  . Abdominal hysterectomy  1973  . Irrigation and debridement buttocks Right 10/21/2015    Procedure: DRAINAGE GLUTEAL ABSCESS;  Surgeon: Christene Lye, MD;  Location: [facility] ORS;  Service: General;  Laterality: Right;    Home Medications:    Medication List       This list is accurate as of: 11/18/15  9:43 AM.  Always use your most recent med list.               acetaminophen 650 MG CR tablet  Commonly known  as:  TYLENOL  Take 650 mg by mouth every 8 (eight) hours as needed for pain. Reported on 11/18/2015     calcium-vitamin D 500-200 MG-UNIT tablet  Commonly known as:  OSCAL WITH D  Take 2 tablets by mouth 2 (two) times daily.     carvedilol 25 MG tablet  Commonly known as:  COREG  Take 25 mg by mouth 2 (two) times daily with a meal.     diltiazem 240 MG 24 hr capsule  Commonly known as:  CARDIZEM CD  Take 240 mg by mouth daily.     ferrous sulfate 325 (65 FE) MG tablet  Take 325 mg by mouth daily with breakfast.     latanoprost 0.005 % ophthalmic solution  Commonly known as:  XALATAN  Place 1 drop into both eyes at bedtime.     pantoprazole 40 MG tablet  Commonly known as:  PROTONIX     pravastatin 40 MG tablet  Commonly known as:  PRAVACHOL  Take 40 mg by mouth daily at 6 PM.     raloxifene 60 MG tablet  Commonly known as:  EVISTA  Take 60 mg by mouth daily.     vitamin C 1000 MG tablet  Take 1,000 mg by mouth daily.        Allergies:  No Known Allergies  Family History: Family History  Problem Relation Age of Onset  . Heart attack Father   . Emphysema Father   . Diabetes type II Sister   . Heart disease Mother   . Hypertension Mother   . Kidney disease Neg Hx   . Bladder Cancer Neg Hx   . Breast cancer Neg Hx     Social History:  reports that [name] has never smoked. [name] has never used smokeless tobacco. [name] reports that [name] does not drink alcohol or use illicit drugs.   Physical Exam: BP 126/69 mmHg  Pulse 77  Ht 5\' 1"  (1.549 m)  Wt 141 lb (63.957 kg)  BMI 26.66 kg/m2  Constitutional:  Alert and oriented, No acute distress. HEENT: [place] AT, moist mucus membranes.  Trachea midline, no masses. Cardiovascular: No clubbing, cyanosis, or edema. Respiratory: Normal respiratory effort, no increased work of breathing. GI: Abdomen is soft, nontender, nondistended, no abdominal masses  GU:  Normal external genitalia. Normal urethral meatus. Skin: No rashes,  bruises or suspicious lesions. Neurologic: Grossly intact, no focal deficits, moving all 4 extremities. Psychiatric: Normal mood and affect.  Laboratory Data: Lab Results  Component Value Date   WBC 8.3 10/21/2015   HGB 8.7* 10/21/2015   HCT 27.0* 10/21/2015   MCV 80.6 10/21/2015   PLT 475* 10/21/2015    Urinalysis UA reviewed, no evidence of infection  Pertinent Imaging: Study Result     CLINICAL DATA: Post drainage of RIGHT gluteal abscess, past history bladder cancer, hysterectomy, lumbar spine surgery  EXAM: CT ABDOMEN AND PELVIS  WITH CONTRAST  TECHNIQUE: Multidetector CT imaging of the abdomen and pelvis was performed using the standard protocol following bolus administration of intravenous contrast. Sagittal and coronal MPR images reconstructed from axial data set.  CONTRAST: 38mL ISOVUE-300 IOPAMIDOL (ISOVUE-300) INJECTION 61% IV. Dilute oral contrast.  COMPARISON: 09/15/2015, 09/02/2015, 08/04/2014  FINDINGS: Lung bases clear.  Beam hardening artifacts from orthopedic hardware lumbar spine.  Liver, gallbladder, spleen, pancreas, kidneys, and adrenal glands unremarkable.  Pigtail drainage catheter at the superior margin of the RIGHT gluteal muscles with minimal residual fluid collection 2.2 x 1.5 cm previously 5.3 x 3.6 cm.  Incomplete distention of the proximal stomach, unable to exclude proximal gastric wall thickening with this appearance.  Stomach and bowel loops otherwise unremarkable.  Normal appearing ovaries with surgical absence of the uterus.  Unremarkable bladder and ureters.  Scattered atherosclerotic calcifications.  8 mm short axis LEFT pelvic lymph node image 66, measures 6 mm short axis on 08/04/2014.  8 mm short axis lateral LEFT pelvic sidewall node image 61, measured 6 mm on 09/02/2015  No mass, adenopathy, free air or free fluid.  No acute osseous findings.  IMPRESSION: Marked decrease in size of  abscess collection superficial to the RIGHT gluteal muscles post percutaneous drainage procedure.  Slight increase in size of LEFT pelvic lymph nodes since prior exams, nonspecific but in the setting of a history of bladder cancer, could be due to reactive process or tumor.  Recommend attention on short-term follow-up studies in 2-3 months.  No new intra-abdominal or intrapelvic abnormalities.   Electronically Signed  By: Lavonia Dana M.D.  On: 09/26/2015 15:08     Cystoscopy Procedure Note  Patient identification was confirmed, informed consent was obtained, and patient was prepped using Betadine solution.  Lidocaine jelly was administered per urethral meatus.    Preoperative abx where received prior to procedure.    Procedure: - Flexible cystoscope introduced, without any difficulty.   - Thorough search of the bladder revealed:    normal urethral meatus    normal urothelium    no stones    no ulcers     no tumors    no urethral polyps    no trabeculation  - Ureteral orifices were normal in position and appearance.  Post-Procedure: - Patient tolerated the procedure well  Assessment & Plan:  81 year old female with a history of high-grade T1, Tis of the right bladder neck dx 09/2014 followed by induction BCG x 6, and most recently  maintenance BCG completed 04/2015 and 07/2015.  [name] returns today for surveillance cystoscopy.   1. Malignant neoplasm of bladder neck Cystoscopy today negative.  Recommend q3 month surveillance cystoscopy. Recommend continuation of BCG maintenance due in 01/2016  2.  Enlarged pelvic lymph node Subclinical nodes, 8 mm from 6 mm on left Suspect reactive secondary to BCG Given negative cysto, small size increase of nodes in setting of BCG, do not suspect malignant pathology  - Urinalysis, Complete   Return in about 3 months (around 02/18/2016) for cystoscopy in 3 months, then 3 weeks of BCG thereafter.  Hollice Espy,  MD  [facility] [address], [place],  [address] [phone]

## 2015-11-21 ENCOUNTER — Encounter: Payer: Self-pay | Admitting: General Surgery

## 2015-11-24 ENCOUNTER — Telehealth: Payer: Self-pay | Admitting: *Deleted

## 2015-11-24 NOTE — Telephone Encounter (Signed)
I have scheduled an appointment for this pt on 12-07-15 at 4:30 (this is first available) she called and states the swelling has returned, no pain. Her drain was removed 11-16-15.

## 2015-11-25 ENCOUNTER — Other Ambulatory Visit: Payer: Medicare Other | Admitting: Urology

## 2015-11-30 ENCOUNTER — Encounter: Payer: Self-pay | Admitting: General Surgery

## 2015-11-30 ENCOUNTER — Ambulatory Visit (INDEPENDENT_AMBULATORY_CARE_PROVIDER_SITE_OTHER): Payer: Medicare Other | Admitting: General Surgery

## 2015-11-30 VITALS — BP 132/64 | HR 74 | Temp 97.8°F | Resp 12 | Ht 61.0 in | Wt 141.0 lb

## 2015-11-30 DIAGNOSIS — L0231 Cutaneous abscess of buttock: Secondary | ICD-10-CM

## 2015-11-30 MED ORDER — DOXYCYCLINE HYCLATE 100 MG PO CAPS: 100.0000 mg | ORAL_CAPSULE | Freq: Two times a day (BID) | ORAL | Status: DC

## 2015-11-30 NOTE — Patient Instructions (Signed)
The patient is aware to call back for any questions or concerns.  

## 2015-11-30 NOTE — Progress Notes (Signed)
Patient ID: [name], female   DOB: 07-10-1932, 82 y.o.   MRN: [id number]  Chief Complaint  Patient presents with  . Follow-up    HPI [name] is a 82 y.o. female.  She states the swelling in the right gluteal area started coming back last week. She noticed that yesterday there was some drainage. Pain yesterday but not today. I have reviewed the history of present illness with the patient.  HPI  Past Medical History  Diagnosis Date  . Hypertension   . Anemia   . DDD (degenerative disc disease), cervical   . DDD (degenerative disc disease), lumbar   . Elevated lipids   . Osteoporosis   . Glaucoma   . Multiple gastric ulcers   . Foot pain   . Colon polyp   . Chronic low back pain   . Carotid arterial disease ([facility]) 01/09/2014    Overview:  Minimal on screening   . Cancer [facility] 2016    bladder tumor    Past Surgical History  Procedure Laterality Date  . Back surgery      x 6  . Carpal tunnel release Right   . Wrist fracture surgery Left   . Knee arthroscopy Right     x2  . Mandible surgery Bilateral     x2  . Cataract extraction Bilateral   . Transurethral resection of bladder tumor N/A 09/29/2014    Procedure: TRANSURETHRAL RESECTION OF BLADDER TUMOR (TURBT);  Surgeon: Vanna ScotlandAshley Brandon, MD;  Location: [facility] ORS;  Service: Urology;  Laterality: N/A;  . Cystoscopy w/ retrogrades Bilateral 09/29/2014    Procedure: CYSTOSCOPY WITH RETROGRADE PYELOGRAM;  Surgeon: Vanna ScotlandAshley Brandon, MD;  Location: [facility] ORS;  Service: Urology;  Laterality: Bilateral;  . Eye surgery    . Cystoscopy with biopsy N/A 11/02/2014    Procedure: CYSTOSCOPY WITH BIOPSY/WITH MITOMYCIN;  Surgeon: Vanna ScotlandAshley Brandon, MD;  Location: [facility] ORS;  Service: Urology;  Laterality: N/A;  . Foot surgery Right   . Esophagogastroduodenoscopy (egd) with propofol N/A 05/10/2015    Procedure: ESOPHAGOGASTRODUODENOSCOPY (EGD) WITH PROPOFOL;  Surgeon: Christena DeemMartin U Skulskie, MD;  [facility];  Service:  Endoscopy;  Laterality: N/A;  . Shoulder arthroscopy with rotator cuff repair and subacromial decompression Right 05/25/2015    Procedure: SHOULDER ARTHROSCOPY WITH ROTATOR CUFF REPAIR AND SUBACROMIAL DECOMPRESSION, release long head biceps tendon;  Surgeon: Erin SonsHarold Kernodle, MD;  Location: [facility] ORS;  Service: Orthopedics;  Laterality: Right;  . Abdominal hysterectomy  1973  . Irrigation and debridement buttocks Right 10/21/2015    Procedure: DRAINAGE GLUTEAL ABSCESS;  Surgeon: Kieth BrightlySeeplaputhur G Sankar, MD;  Location: [facility] ORS;  Service: General;  Laterality: Right;    Family History  Problem Relation Age of Onset  . Heart attack Father   . Emphysema Father   . Diabetes type II Sister   . Heart disease Mother   . Hypertension Mother   . Kidney disease Neg Hx   . Bladder Cancer Neg Hx   . Breast cancer Neg Hx     Social History Social History  Substance Use Topics  . Smoking status: Never Smoker   . Smokeless tobacco: Never Used  . Alcohol Use: No    No Known Allergies  Current Outpatient Prescriptions  Medication Sig Dispense Refill  . acetaminophen (TYLENOL) 650 MG CR tablet Take 650 mg by mouth every 8 (eight) hours as needed for pain. Reported on 11/18/2015    . Ascorbic Acid (VITAMIN C) 1000 MG tablet Take 1,000 mg by mouth  daily.    . calcium-vitamin D (OSCAL WITH D) 500-200 MG-UNIT per tablet Take 2 tablets by mouth 2 (two) times daily.     . carvedilol (COREG) 25 MG tablet Take 25 mg by mouth 2 (two) times daily with a meal.    . diltiazem (CARDIZEM CD) 240 MG 24 hr capsule Take 240 mg by mouth daily.    . ferrous sulfate 325 (65 FE) MG tablet Take 325 mg by mouth daily with breakfast.    . latanoprost (XALATAN) 0.005 % ophthalmic solution Place 1 drop into both eyes at bedtime.    . pantoprazole (PROTONIX) 40 MG tablet   0  . pravastatin (PRAVACHOL) 40 MG tablet Take 40 mg by mouth daily at 6 PM.    . raloxifene (EVISTA) 60 MG tablet Take 60 mg by mouth daily.    [name]  doxycycline (VIBRAMYCIN) 100 MG capsule Take 1 capsule (100 mg total) by mouth 2 (two) times daily. 20 capsule 0   Current Facility-Administered Medications  Medication Dose Route Frequency Provider Last Rate Last Dose  . BCG vaccine (THERACYS) injection 81 mg  81 mg Bladder Instillation Once Roda Shutters, FNP      . sodium chloride flush (NS) 0.9 % injection 50 mL  50 mL Intracatheter Once Nori Riis, PA-C        Review of Systems Review of Systems  Constitutional: Negative.   Respiratory: Negative.   Cardiovascular: Negative.     Blood pressure 132/64, pulse 74, temperature 97.8 F (36.6 C), temperature source Oral, resp. rate 12, height 5\' 1"  (1.549 m), weight 141 lb (63.957 kg).  Physical Exam Physical Exam  Constitutional: She is oriented to person, place, and time. She appears well-developed and well-nourished.  Neurological: She is alert and oriented to person, place, and time.  Skin: Skin is warm and dry.  She has recurrence of abscess right gluteal area. Drainage is more serosanguinous as opposed to thick white that she had before. Also has skin induration suggestive of active skin infection. culture repeated.  Data Reviewed Prior notes Assessment   Recurrent abscess right gluteal area. Etiology of this is still uncertain. Likely will need neurosurgery consult  Previous 2 cultures were negative Doxycycline 100 mg BID 10 days.    Plan    Culture pending  Doxycycline 100 mg BID 10 days. Follow up next week.      PCP:  [name] This information has been scribed by Karie Fetch RN, BSN,BC.   SANKAR,SEEPLAPUTHUR G 12/06/2015, 8:15 AM

## 2015-12-05 LAB — ANAEROBIC AND AEROBIC CULTURE

## 2015-12-06 ENCOUNTER — Encounter: Payer: Self-pay | Admitting: General Surgery

## 2015-12-07 ENCOUNTER — Ambulatory Visit (INDEPENDENT_AMBULATORY_CARE_PROVIDER_SITE_OTHER): Payer: Medicare Other | Admitting: General Surgery

## 2015-12-07 ENCOUNTER — Ambulatory Visit: Payer: Self-pay | Admitting: General Surgery

## 2015-12-07 ENCOUNTER — Encounter: Payer: Self-pay | Admitting: General Surgery

## 2015-12-07 VITALS — BP 124/64 | HR 72 | Resp 16 | Ht 61.0 in | Wt 141.0 lb

## 2015-12-07 DIAGNOSIS — L0231 Cutaneous abscess of buttock: Secondary | ICD-10-CM
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
## 2015-12-07 NOTE — Patient Instructions (Signed)
Patient to return as needed. 

## 2015-12-08 ENCOUNTER — Encounter: Payer: Self-pay | Admitting: General Surgery

## 2015-12-14 ENCOUNTER — Inpatient Hospital Stay: Payer: Medicare Other | Attending: Oncology

## 2015-12-14 ENCOUNTER — Inpatient Hospital Stay (HOSPITAL_BASED_OUTPATIENT_CLINIC_OR_DEPARTMENT_OTHER): Payer: Medicare Other | Admitting: Oncology

## 2015-12-14 ENCOUNTER — Ambulatory Visit: Payer: Medicare Other | Admitting: Family Medicine

## 2015-12-14 VITALS — BP 167/76 | HR 58 | Temp 95.2°F | Resp 17 | Ht 61.0 in | Wt 138.0 lb

## 2015-12-14 DIAGNOSIS — G8929 Other chronic pain: Secondary | ICD-10-CM | POA: Diagnosis not present

## 2015-12-14 DIAGNOSIS — M545 Low back pain: Secondary | ICD-10-CM | POA: Diagnosis not present

## 2015-12-14 DIAGNOSIS — D509 Iron deficiency anemia, unspecified: Secondary | ICD-10-CM | POA: Insufficient documentation

## 2015-12-14 DIAGNOSIS — K5900 Constipation, unspecified: Secondary | ICD-10-CM

## 2015-12-14 DIAGNOSIS — K2960 Other gastritis without bleeding: Secondary | ICD-10-CM | POA: Diagnosis not present

## 2015-12-14 DIAGNOSIS — I10 Essential (primary) hypertension: Secondary | ICD-10-CM | POA: Insufficient documentation

## 2015-12-14 DIAGNOSIS — R5381 Other malaise: Secondary | ICD-10-CM | POA: Diagnosis not present

## 2015-12-14 DIAGNOSIS — M5136 Other intervertebral disc degeneration, lumbar region: Secondary | ICD-10-CM | POA: Diagnosis not present

## 2015-12-14 DIAGNOSIS — Z79899 Other long term (current) drug therapy: Secondary | ICD-10-CM | POA: Insufficient documentation

## 2015-12-14 DIAGNOSIS — M5030 Other cervical disc degeneration, unspecified cervical region: Secondary | ICD-10-CM | POA: Insufficient documentation

## 2015-12-14 DIAGNOSIS — C679 Malignant neoplasm of bladder, unspecified: Secondary | ICD-10-CM | POA: Diagnosis not present

## 2015-12-14 LAB — CBC WITH DIFFERENTIAL/PLATELET
Basophils Absolute: 0 10*3/uL (ref 0–0.1)
Basophils Relative: 1 %
EOS PCT: 1 %
Eosinophils Absolute: 0.1 10*3/uL (ref 0–0.7)
HEMATOCRIT: 28 % — AB (ref 35.0–47.0)
HEMOGLOBIN: 9 g/dL — AB (ref 12.0–16.0)
LYMPHS ABS: 1.7 10*3/uL (ref 1.0–3.6)
LYMPHS PCT: 20 %
MCH: 26.1 pg (ref 26.0–34.0)
MCHC: 32.2 g/dL (ref 32.0–36.0)
MCV: 81 fL (ref 80.0–100.0)
Monocytes Absolute: 0.6 10*3/uL (ref 0.2–0.9)
Monocytes Relative: 7 %
NEUTROS ABS: 5.9 10*3/uL (ref 1.4–6.5)
Neutrophils Relative %: 71 %
PLATELETS: 392 10*3/uL (ref 150–440)
RBC: 3.46 MIL/uL — AB (ref 3.80–5.20)
RDW: 18.4 % — ABNORMAL HIGH (ref 11.5–14.5)
WBC: 8.2 10*3/uL (ref 3.6–11.0)

## 2015-12-14 LAB — IRON AND TIBC
IRON: 24 ug/dL — AB (ref 28–170)
Saturation Ratios: 10 % — ABNORMAL LOW (ref 10.4–31.8)
TIBC: 242 ug/dL — ABNORMAL LOW (ref 250–450)
UIBC: 218 ug/dL

## 2015-12-14 LAB — COMPREHENSIVE METABOLIC PANEL
ALK PHOS: 93 U/L (ref 38–126)
ALT: 16 U/L (ref 14–54)
AST: 16 U/L (ref 15–41)
Albumin: 3.3 g/dL — ABNORMAL LOW (ref 3.5–5.0)
Anion gap: 9 (ref 5–15)
BILIRUBIN TOTAL: 0.6 mg/dL (ref 0.3–1.2)
BUN: 16 mg/dL (ref 6–20)
CO2: 26 mmol/L (ref 22–32)
CREATININE: 0.71 mg/dL (ref 0.44–1.00)
Calcium: 8.7 mg/dL — ABNORMAL LOW (ref 8.9–10.3)
Chloride: 105 mmol/L (ref 101–111)
Glucose, Bld: 108 mg/dL — ABNORMAL HIGH (ref 65–99)
Potassium: 4 mmol/L (ref 3.5–5.1)
Sodium: 140 mmol/L (ref 135–145)
TOTAL PROTEIN: 8.1 g/dL (ref 6.5–8.1)

## 2015-12-14 LAB — FERRITIN
FERRITIN: 223 ng/mL (ref 11–307)
Ferritin: 249 ng/mL (ref 11–307)
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
## 2015-12-15 LAB — PROTEIN ELECTROPHORESIS, SERUM
A/G Ratio: 0.7 (ref 0.7–1.7)
Albumin ELP: 2.9 g/dL (ref 2.9–4.4)
Alpha-1-Globulin: 0.5 g/dL — ABNORMAL HIGH (ref 0.0–0.4)
Alpha-2-Globulin: 1 g/dL (ref 0.4–1.0)
Beta Globulin: 1.1 g/dL (ref 0.7–1.3)
GAMMA GLOBULIN: 1.6 g/dL (ref 0.4–1.8)
GLOBULIN, TOTAL: 4.2 g/dL — AB (ref 2.2–3.9)
M-SPIKE, %: 1.1 g/dL — AB
TOTAL PROTEIN ELP: 7.1 g/dL (ref 6.0–8.5)

## 2015-12-15 LAB — KAPPA/LAMBDA LIGHT CHAINS
KAPPA FREE LGHT CHN: 132.3 mg/L — AB (ref 3.3–19.4)
Kappa, lambda light chain ratio: 12.84 — ABNORMAL HIGH (ref 0.26–1.65)
Lambda free light chains: 10.3 mg/L (ref 5.7–26.3)

## 2015-12-21 ENCOUNTER — Ambulatory Visit (INDEPENDENT_AMBULATORY_CARE_PROVIDER_SITE_OTHER): Payer: Medicare Other | Admitting: General Surgery

## 2015-12-21 ENCOUNTER — Encounter: Payer: Self-pay | Admitting: General Surgery

## 2015-12-21 ENCOUNTER — Other Ambulatory Visit: Payer: Self-pay

## 2015-12-21 VITALS — BP 118/54 | HR 82 | Resp 14 | Ht 60.0 in | Wt 139.0 lb

## 2015-12-21 DIAGNOSIS — L0231 Cutaneous abscess of buttock: Secondary | ICD-10-CM

## 2015-12-21 DIAGNOSIS — Z9889 Other specified postprocedural states: Secondary | ICD-10-CM

## 2015-12-21 NOTE — Patient Instructions (Addendum)
Referral to neurosurgery-to ensure abscess has no connection with multiple prior back surgeries   Patient to return as needed.

## 2015-12-21 NOTE — Progress Notes (Signed)
Patient ID: [name], female   DOB: 01/29/1933, 82 y.o.   MRN: [id number]  Chief Complaint  Patient presents with  . Other    abscess    HPI [name] is a 82 y.o. female here today for follow up on a chronic gluteal abscess. She states the area is feeling much better. Denies draining. No new complaints. I have reviewed the history of present illness with the patient.  HPI  Past Medical History:  Diagnosis Date  . Anemia   . Cancer [name]) 2016   bladder tumor  . Carotid arterial disease ([name]) 01/09/2014   Overview:  Minimal on screening   . Chronic low back pain   . Colon polyp   . DDD (degenerative disc disease), cervical   . DDD (degenerative disc disease), lumbar   . Elevated lipids   . Foot pain   . Glaucoma   . Hypertension   . Multiple gastric ulcers   . Osteoporosis     Past Surgical History:  Procedure Laterality Date  . ABDOMINAL HYSTERECTOMY  1973  . BACK SURGERY     x 6  . CARPAL TUNNEL RELEASE Right   . CATARACT EXTRACTION Bilateral   . CYSTOSCOPY W/ RETROGRADES Bilateral 09/29/2014   Procedure: CYSTOSCOPY WITH RETROGRADE PYELOGRAM;  Surgeon: Elizabeth Espy, MD;  Location: [facility] ORS;  Service: Urology;  Laterality: Bilateral;  . CYSTOSCOPY WITH BIOPSY N/A 11/02/2014   Procedure: CYSTOSCOPY WITH BIOPSY/WITH MITOMYCIN;  Surgeon: Elizabeth Espy, MD;  Location: [facility] ORS;  Service: Urology;  Laterality: N/A;  . ESOPHAGOGASTRODUODENOSCOPY (EGD) WITH PROPOFOL N/A 05/10/2015   Procedure: ESOPHAGOGASTRODUODENOSCOPY (EGD) WITH PROPOFOL;  Surgeon: Elizabeth Sails, MD;  [name];  Service: Endoscopy;  Laterality: N/A;  . EYE SURGERY    . FOOT SURGERY Right   . IRRIGATION AND DEBRIDEMENT BUTTOCKS Right 10/21/2015   Procedure: DRAINAGE GLUTEAL ABSCESS;  Surgeon: Elizabeth Lye, MD;  Location: [facility] ORS;  Service: General;  Laterality: Right;  . KNEE ARTHROSCOPY Right    x2  . MANDIBLE SURGERY Bilateral    x2  . SHOULDER  ARTHROSCOPY WITH ROTATOR CUFF REPAIR AND SUBACROMIAL DECOMPRESSION Right 05/25/2015   Procedure: SHOULDER ARTHROSCOPY WITH ROTATOR CUFF REPAIR AND SUBACROMIAL DECOMPRESSION, release long head biceps tendon;  Surgeon: Elizabeth Kail, MD;  Location: [facility] ORS;  Service: Orthopedics;  Laterality: Right;  . TRANSURETHRAL RESECTION OF BLADDER TUMOR N/A 09/29/2014   Procedure: TRANSURETHRAL RESECTION OF BLADDER TUMOR (TURBT);  Surgeon: [facility] Espy, MD;  Location: [facility] ORS;  Service: Urology;  Laterality: N/A;  . WRIST FRACTURE SURGERY Left     Family History  Problem Relation Age of Onset  . Heart attack Father   . Emphysema Father   . Heart disease Mother   . Hypertension Mother   . Diabetes type II Sister   . Kidney disease Neg Hx   . Bladder Cancer Neg Hx   . Breast cancer Neg Hx     Social History Social History  Substance Use Topics  . Smoking status: Never Smoker  . Smokeless tobacco: Never Used  . Alcohol use No    No Known Allergies  Current Outpatient Prescriptions  Medication Sig Dispense Refill  . acetaminophen (TYLENOL) 650 MG CR tablet Take 650 mg by mouth every 8 (eight) hours as needed for pain. Reported on 11/18/2015    . Ascorbic Acid (VITAMIN C) 1000 MG tablet Take 1,000 mg by mouth daily.    . calcium-vitamin D (OSCAL WITH D) 500-200 MG-UNIT per  tablet Take 2 tablets by mouth 2 (two) times daily.     . carvedilol (COREG) 25 MG tablet Take 25 mg by mouth 2 (two) times daily with a meal.    . diltiazem (CARDIZEM CD) 240 MG 24 hr capsule Take 240 mg by mouth daily.    [name] doxycycline (VIBRAMYCIN) 100 MG capsule Take 1 capsule (100 mg total) by mouth 2 (two) times daily. 20 capsule 0  . latanoprost (XALATAN) 0.005 % ophthalmic solution Place 1 drop into both eyes at bedtime.    . pantoprazole (PROTONIX) 40 MG tablet   0  . pravastatin (PRAVACHOL) 40 MG tablet Take 40 mg by mouth daily at 6 PM.    . raloxifene (EVISTA) 60 MG tablet Take 60 mg by mouth daily.      Current Facility-Administered Medications  Medication Dose Route Frequency Provider Last Rate Last Dose  . BCG vaccine (THERACYS) injection 81 mg  81 mg Bladder Instillation Once Elizabeth Shutters, FNP      . sodium chloride flush (NS) 0.9 % injection 50 mL  50 mL Intracatheter Once Elizabeth Riis, PA-C        Review of Systems Review of Systems  Constitutional: Negative.   Respiratory: Negative.   Cardiovascular: Negative.     Blood pressure (!) 118/54, pulse 82, resp. rate 14, height 5' (1.524 m), weight 139 lb (63 kg).  Physical Exam Physical Exam  Constitutional: She is oriented to person, place, and time. She appears well-developed and well-nourished.  Neurological: She is alert and oriented to person, place, and time.  Skin: Skin is warm and dry.       Data Reviewed Prior notes C/S- Bacillus species  Assessment    Recurring right gluteal abscess- extending over iliac crest to quadratus area. No clear etiology for this.   Initial 2 cultures grew no organisms  Plan    Referral to neurosurgery to ensure abscess has no connection with multiple prior back surgeries   Patient to return as needed.  PCP:  R[facility]  This information has been scribed by Gaspar Cola CMA.    SANKAR,SEEPLAPUTHUR G 12/21/2015, 1:19 PM

## 2015-12-23 ENCOUNTER — Encounter (INDEPENDENT_AMBULATORY_CARE_PROVIDER_SITE_OTHER): Payer: Self-pay

## 2015-12-23 ENCOUNTER — Other Ambulatory Visit: Payer: Self-pay | Admitting: Oncology

## 2015-12-23 ENCOUNTER — Inpatient Hospital Stay: Payer: Medicare Other

## 2015-12-23 VITALS — BP 115/62 | HR 70 | Temp 98.8°F | Resp 20

## 2015-12-23 DIAGNOSIS — D509 Iron deficiency anemia, unspecified: Secondary | ICD-10-CM | POA: Diagnosis not present

## 2015-12-23 MED ORDER — SODIUM CHLORIDE 0.9 % IV SOLN
510.0000 mg | Freq: Once | INTRAVENOUS | Status: AC
  Administered 2015-12-23: 510 mg via INTRAVENOUS
  Filled 2015-12-23: qty 17

## 2015-12-23 MED ORDER — SODIUM CHLORIDE 0.9 % IV SOLN
Freq: Once | INTRAVENOUS | Status: AC
  Administered 2015-12-23: 11:00:00 via INTRAVENOUS
  Filled 2015-12-23: qty 1000

## 2015-12-30 ENCOUNTER — Ambulatory Visit: Payer: Medicare Other

## 2016-02-06 ENCOUNTER — Other Ambulatory Visit: Payer: Self-pay | Admitting: Internal Medicine

## 2016-02-06 DIAGNOSIS — Z1231 Encounter for screening mammogram for malignant neoplasm of breast: Secondary | ICD-10-CM

## 2016-02-07 ENCOUNTER — Encounter: Payer: Medicare Other | Attending: Internal Medicine | Admitting: Internal Medicine

## 2016-02-07 ENCOUNTER — Other Ambulatory Visit
Admission: RE | Admit: 2016-02-07 | Discharge: 2016-02-07 | Disposition: A | Discharge status: Home / Self Care | Payer: Medicare Other | Source: Ambulatory Visit | Attending: Internal Medicine | Admitting: Internal Medicine

## 2016-02-07 DIAGNOSIS — I10 Essential (primary) hypertension: Secondary | ICD-10-CM | POA: Diagnosis not present

## 2016-02-07 DIAGNOSIS — T148 Other injury of unspecified body region: Secondary | ICD-10-CM | POA: Diagnosis present

## 2016-02-07 DIAGNOSIS — Z8551 Personal history of malignant neoplasm of bladder: Secondary | ICD-10-CM | POA: Diagnosis not present

## 2016-02-07 DIAGNOSIS — B999 Unspecified infectious disease: Secondary | ICD-10-CM | POA: Diagnosis not present

## 2016-02-07 DIAGNOSIS — L0231 Cutaneous abscess of buttock: Secondary | ICD-10-CM | POA: Insufficient documentation

## 2016-02-07 DIAGNOSIS — T8131XD Disruption of external operation (surgical) wound, not elsewhere classified, subsequent encounter: Secondary | ICD-10-CM | POA: Insufficient documentation

## 2016-02-07 DIAGNOSIS — Y839 Surgical procedure, unspecified as the cause of abnormal reaction of the patient, or of later complication, without mention of misadventure at the time of the procedure: Secondary | ICD-10-CM | POA: Diagnosis not present

## 2016-02-07 NOTE — Progress Notes (Signed)
[name], [name] ([id number]) Visit Report for 02/07/2016 Abuse/Suicide Risk Screen Details NITARA, SCHABEL Date of Service: 02/07/2016 2:15 PM Patient Name: G. Patient Account Number: [id number] Medical Record Treating RN: Cornell Barman [id number] Number: Other Clinician: Date of Birth/Sex: 05-03-1933 (82 y.o. Female) Treating Dellia Nims, [name] Primary Care Physician/Extender: Marlou Starks Physician: Referring Physician: Jenelle Mages in Treatment: 0 Abuse/Suicide Risk Screen Items Answer ABUSE/SUICIDE RISK SCREEN: Has anyone close to you tried to hurt or harm you recentlyo No Do you feel uncomfortable with anyone in your familyo No Has anyone forced you do things that you didnot want to doo No Do you have any thoughts of harming yourselfo No Patient displays signs or symptoms of abuse and/or neglect. No Electronic Signature(s) Signed: 02/07/2016 5:05:22 PM By: Gretta Cool, RN, BSN, Kim RN, BSN Entered By: Gretta Cool, RN, BSN, Kim on 02/07/2016 14:20:34 Caraher, [name] ([id number]) -------------------------------------------------------------------------------- Activities of Daily Living Details [name] Date of Service: 02/07/2016 2:15 PM Patient Name: G. Patient Account Number: [id number] Medical Record Treating RN: Cornell Barman [id number] Number: Other Clinician: Date of Birth/Sex: 1932-09-18 (82 y.o. Female) Treating Dellia Nims, [name] Primary Care Physician/Extender: Marlou Starks Physician: Referring Physician: Jenelle Mages in Treatment: 0 Activities of Daily Living Items Answer Activities of Daily Living (Please select one for each item) Drive Automobile Completely Able Take Medications Completely Able Use Telephone Completely Able Care for Appearance Completely Able Use Toilet Completely Able Bath / Shower Completely Able Dress Self Completely Able Feed Self Completely Able Walk Completely Able Get In / Out Bed  Completely Able Housework Completely Able Prepare Meals Completely Able Handle Money Completely Able Shop for Self Completely Able Electronic Signature(s) Signed: 02/07/2016 5:05:22 PM By: Gretta Cool, RN, BSN, Kim RN, BSN Entered By: Gretta Cool, RN, BSN, Kim on 02/07/2016 14:20:44 Huxford, [name] ([id number]) -------------------------------------------------------------------------------- Education Assessment Details [name] Date of Service: 02/07/2016 2:15 PM Patient Name: G. Patient Account Number: [id number] Medical Record Treating RN: Cornell Barman [id number] Number: Other Clinician: Date of Birth/Sex: [birth year] (82 y.o. Female) Treating Dellia Nims, [name] Primary Care Physician/Extender: Marlou Starks Physician: Referring Physician: Jenelle Mages in Treatment: 0 Primary Learner Assessed: Patient Learning Preferences/Education Level/Primary Language Learning Preference: Explanation, Demonstration Highest Education Level: High School Preferred Language: English Cognitive Barrier Assessment/Beliefs Language Barrier: No Translator Needed: No Memory Deficit: No Emotional Barrier: No Cultural/Religious Beliefs Affecting Medical No Care: Physical Barrier Assessment Impaired Vision: Yes Glasses Impaired Hearing: No Decreased Hand dexterity: No Knowledge/Comprehension Assessment Knowledge Level: High Comprehension Level: High Ability to understand written High instructions: Ability to understand verbal High instructions: Motivation Assessment Anxiety Level: Calm Cooperation: Cooperative Education Importance: Acknowledges Need Interest in Health Problems: Asks Questions Perception: Coherent Willingness to Engage in Self- High Management Activities: [place], [place]. ([id number]) Readiness to Engage in Self- Management Activities: Electronic Signature(s) Signed: 02/07/2016 5:05:22 PM By: Gretta Cool, RN, BSN, Kim RN, BSN Entered By: Gretta Cool,  RN, BSN, Kim on 02/07/2016 14:21:15 [name] ([id number]) -------------------------------------------------------------------------------- Fall Risk Assessment Details [name] Date of Service: 02/07/2016 2:15 PM Patient Name: G. Patient Account Number: [id number] Medical Record Treating RN: Cornell Barman [id number] Number: Other Clinician: Date of Birth/Sex: Jan 10, 1933 (82 y.o. Female) Treating Dellia Nims, [name] Primary Care Physician/Extender: Marlou Starks Physician: Referring Physician: Jenelle Mages in Treatment: 0 Fall Risk Assessment Items Have you had 2 or more falls in the last 12 monthso 0 No Have you had any fall that resulted in injury in the last 12 monthso 0 No FALL RISK ASSESSMENT: History of falling -  immediate or within 3 months 0 No Secondary diagnosis 0 No Ambulatory aid None/bed rest/wheelchair/nurse 0 Yes Crutches/cane/walker 0 No Furniture 0 No IV Access/Saline Lock 0 No Gait/Training Normal/bed rest/immobile 0 Yes Weak 0 No Impaired 0 No Mental Status Oriented to own ability 0 Yes Electronic Signature(s) Signed: 02/07/2016 5:05:22 PM By: Gretta Cool, RN, BSN, Kim RN, BSN Entered By: Gretta Cool, RN, BSN, Kim on 02/07/2016 14:21:24 Dworkin, [name] ([id number]) -------------------------------------------------------------------------------- Foot Assessment Details [name] Date of Service: 02/07/2016 2:15 PM Patient Name: G. Patient Account Number: [id number] Medical Record Treating RN: Cornell Barman [id number] Number: Other Clinician: Date of Birth/Sex: 1932/10/03 (82 y.o. Female) Treating Dellia Nims, [name] Primary Care Physician/Extender: Marlou Starks Physician: Referring Physician: Jenelle Mages in Treatment: 0 Foot Assessment Items Site Locations + = Sensation present, - = Sensation absent, C = Callus, U = Ulcer R = Redness, W = Warmth, M = Maceration, PU = Pre-ulcerative lesion F =  Fissure, S = Swelling, D = Dryness Assessment Right: Left: Other Deformity: No No Prior Foot Ulcer: No No Prior Amputation: No No Charcot Joint: No No Ambulatory Status: Gait: Electronic Signature(s) Signed: 02/07/2016 5:05:22 PM By: Gretta Cool, RN, BSN, Kim RN, BSN Entered By: Gretta Cool, RN, BSN, Kim on 02/07/2016 14:22:00 Slappey, [name] ([id number]) AMIYLA, PLOTT ([id number]) -------------------------------------------------------------------------------- Nutrition Risk Assessment Details [name] Date of Service: 02/07/2016 2:15 PM Patient Name: G. Patient Account Number: [id number] Medical Record Treating RN: Cornell Barman [id number] Number: Other Clinician: Date of Birth/Sex: 1932/06/19 (82 y.o. Female) Treating [name], [name] Primary Care Physician/Extender: Marlou Starks Physician: Referring Physician: Jenelle Mages in Treatment: 0 Height (in): 61 Weight (lbs): 144 Body Mass Index (BMI): 27.2 Nutrition Risk Assessment Items NUTRITION RISK SCREEN: I have an illness or condition that made me change the kind and/or 0 No amount of food I eat I eat fewer than two meals per day 0 No I eat few fruits and vegetables, or milk products 0 No I have three or more drinks of beer, liquor or wine almost every day 0 No I have tooth or mouth problems that make it hard for me to eat 0 No I don't always have enough money to buy the food I need 0 No I eat alone most of the time 0 No I take three or more different prescribed or over-the-counter drugs a 1 Yes day Without wanting to, I have lost or gained 10 pounds in the last six 0 No months I am not always physically able to shop, cook and/or feed myself 0 No Nutrition Protocols Good Risk Protocol 0 No interventions needed Moderate Risk Protocol Electronic Signature(s) Signed: 02/07/2016 5:05:22 PM By: Gretta Cool, RN, BSN, Kim RN, BSN Entered By: Gretta Cool, RN, BSN, Kim on 02/07/2016 14:21:53

## 2016-02-07 NOTE — Progress Notes (Signed)
[name], [name] ([id number]) Visit Report for 02/07/2016 Allergy List Details [name], [name] Date of Service: 02/07/2016 2:15 PM Patient Name: G. Patient Account Number: [id number] Medical Record Treating RN: Cornell Barman [id number] Number: Other Clinician: Date of Birth/Sex: April 04, 1933 (82 y.o. Female) Treating Dellia Nims, [name] Primary Care Physician/Extender: Marlou Starks Physician: Referring Physician: Jenelle Mages in Treatment: 0 Allergies Active Allergies No Known Drug Allergies Allergy Notes Electronic Signature(s) Signed: 02/07/2016 5:05:22 PM By: Gretta Cool, RN, BSN, Kim RN, BSN Entered By: Gretta Cool, RN, BSN, Kim on 02/07/2016 14:12:25 [name], [name] -------------------------------------------------------------------------------- Arrival Information Details [name] Date of Service: 02/07/2016 2:15 PM Patient Name: G. Patient Account Number: [id number] Medical Record Treating RN: Cornell Barman [id number] Number: Other Clinician: Date of Birth/Sex: 1932/11/18 (82 y.o. Female) Treating Dellia Nims, [name] Primary Care Physician/Extender: Marlou Starks Physician: Referring Physician: Jenelle Mages in Treatment: 0 Visit Information Patient Arrived: Ambulatory Arrival Time: 14:09 Accompanied By: self Transfer Assistance: None Patient Identification Verified: Yes Secondary Verification Process Yes Completed: Patient Has Alerts: Yes Electronic Signature(s) Signed: 02/07/2016 5:05:22 PM By: Gretta Cool RN, BSN, Kim RN, BSN Entered By: Gretta Cool, RN, BSN, Kim on 02/07/2016 14:11:10 [name], [name] -------------------------------------------------------------------------------- Clinic Level of Care Assessment Details [name] Date of Service: 02/07/2016 2:15 PM Patient Name: G. Patient Account Number: [id number] Medical Record Treating RN: Cornell Barman [id number] Number: Other Clinician: Date  of Birth/Sex: 1932/10/25 (82 y.o. Female) Treating Dellia Nims, [name] Primary Care Physician/Extender: Marlou Starks Physician: Referring Physician: Jenelle Mages in Treatment: 0 Clinic Level of Care Assessment Items TOOL 2 Quantity Score []  - Use when only an EandM is performed on the INITIAL visit 0 ASSESSMENTS - Nursing Assessment / Reassessment X - General Physical Exam (combine w/ comprehensive assessment (listed just 1 20 below) when performed on new pt. evals) X - Comprehensive Assessment (HX, ROS, Risk Assessments, Wounds Hx, etc.) 1 25 ASSESSMENTS - Wound and Skin Assessment / Reassessment X - Simple Wound Assessment / Reassessment - one wound 1 5 []  - Complex Wound Assessment / Reassessment - multiple wounds 0 []  - Dermatologic / Skin Assessment (not related to wound area) 0 ASSESSMENTS - Ostomy and/or Continence Assessment and Care []  - Incontinence Assessment and Management 0 []  - Ostomy Care Assessment and Management (repouching, etc.) 0 PROCESS - Coordination of Care X - Simple Patient / Family Education for ongoing care 1 15 []  - Complex (extensive) Patient / Family Education for ongoing care 0 X - Staff obtains Programmer, systems, Records, Test Results / Process Orders 1 10 []  - Staff telephones HHA, Nursing Homes / Clarify orders / etc 0 []  - Routine Transfer to another Facility (non-emergent condition) 0 []  - Routine Hospital Admission (non-emergent condition) 0 X - New Admissions / Biomedical engineer / Ordering NPWT, Apligraf, etc. 1 15 [name], Elizabeth G. ([id number]) []  - Emergency Hospital Admission (emergent condition) 0 X - Simple Discharge Coordination 1 10 []  - Complex (extensive) Discharge Coordination 0 PROCESS - Special Needs []  - Pediatric / Minor Patient Management 0 []  - Isolation Patient Management 0 []  - Hearing / Language / Visual special needs 0 []  - Assessment of Community assistance (transportation, D/C planning, etc.) 0 []  -  Additional assistance / Altered mentation 0 []  - Support Surface(s) Assessment (bed, cushion, seat, etc.) 0 INTERVENTIONS - Wound Cleansing / Measurement X - Wound Imaging (photographs - any number of wounds) 1 5 []  - Wound Tracing (instead of photographs) 0 X - Simple Wound Measurement - one wound 1 5 []  -  Complex Wound Measurement - multiple wounds 0 X - Simple Wound Cleansing - one wound 1 5 []  - Complex Wound Cleansing - multiple wounds 0 INTERVENTIONS - Wound Dressings X - Small Wound Dressing one or multiple wounds 1 10 []  - Medium Wound Dressing one or multiple wounds 0 []  - Large Wound Dressing one or multiple wounds 0 []  - Application of Medications - injection 0 INTERVENTIONS - Miscellaneous []  - External ear exam 0 []  - Specimen Collection (cultures, biopsies, blood, body fluids, etc.) 0 []  - Specimen(s) / Culture(s) sent or taken to Lab for analysis 0 []  - Patient Transfer (multiple staff / Harrel Lemon Lift / Similar devices) 0 [name], [name] ([id number]) []  - Simple Staple / Suture removal (25 or less) 0 []  - Complex Staple / Suture removal (26 or more) 0 []  - Hypo / Hyperglycemic Management (close monitor of Blood Glucose) 0 []  - Ankle / Brachial Index (ABI) - do not check if billed separately 0 [name]: Yes Total Score: 125 Level Of Care: New/Established - Level 4 Electronic Signature(s) Signed: 02/07/2016 5:05:22 PM By: Gretta Cool, RN, BSN, Kim RN, BSN Entered By: Gretta Cool, RN, BSN, Kim on 02/07/2016 14:51:36 [name], [name] ([id number]) -------------------------------------------------------------------------------- Encounter Discharge Information Details [name] Date of Service: 02/07/2016 2:15 PM Patient Name: G. Patient Account Number: [id number] Medical Record Treating RN: Ahmed Prima [id number] Number: Other Clinician: Date of Birth/Sex: 14-Oct-1932 (82 y.o. Female) Treating Dellia Nims,  [name] Primary Care Physician/Extender: Marlou Starks Physician: Referring Physician: Jenelle Mages in Treatment: 0 Encounter Discharge Information Items Schedule Follow-up Appointment: No Medication Reconciliation completed and provided to Patient/Care No [facility]: Provided on Clinical Summary of Care: 02/07/2016 Form Type Recipient Paper Patient SM Electronic Signature(s) Signed: 02/07/2016 2:54:58 PM By: Ruthine Dose Entered By: Ruthine Dose on 02/07/2016 14:54:58 [name], [name] ([id number]) -------------------------------------------------------------------------------- Multi-Disciplinary Care Plan Details [name] Date of Service: 02/07/2016 2:15 PM Patient Name: G. Patient Account Number: [id number] Medical Record Treating RN: Cornell Barman [id number] Number: Other Clinician: Date of Birth/Sex: 10-19-1932 (82 y.o. Female) Treating Dellia Nims, [name] Primary Care Physician/Extender: Marlou Starks Physician: Referring Physician: Jenelle Mages in Treatment: 0 Active Inactive Orientation to the Wound Care Program Nursing Diagnoses: Knowledge deficit related to the wound healing center program Goals: Patient/caregiver will verbalize understanding of the [facility] Program Date Initiated: 02/07/2016 Goal Status: Active Interventions: Provide education on orientation to the wound center Notes: Soft Tissue Infection Nursing Diagnoses: Impaired tissue integrity Potential for infection: soft tissue Goals: Patient will remain free of wound infection Date Initiated: 02/07/2016 Goal Status: Active Interventions: Assess signs and symptoms of infection every visit Notes: Wound/Skin Impairment Nursing Diagnoses: Impaired tissue integrity [name], [name] ([id number]) Goals: Patient/caregiver will verbalize understanding of skin care regimen Date Initiated: 02/07/2016 Goal Status:  Active Interventions: Assess ulceration(s) every visit Notes: Electronic Signature(s) Signed: 02/07/2016 5:05:22 PM By: Gretta Cool, RN, BSN, Kim RN, BSN Entered By: Gretta Cool, RN, BSN, Kim on 02/07/2016 14:22:45 [name], [name] ([id number]) -------------------------------------------------------------------------------- Pain Assessment Details [name] Date of Service: 02/07/2016 2:15 PM Patient Name: G. Patient Account Number: [id number] Medical Record Treating RN: Cornell Barman [id number] Number: Other Clinician: Date of Birth/Sex: 1932-12-21 (82 y.o. Female) Treating Dellia Nims, [name] Primary Care Physician/Extender: Marlou Starks Physician: Referring Physician: Jenelle Mages in Treatment: 0 Active Problems Location of Pain Severity and Description of Pain Patient Has Paino No Site Locations With Dressing Change: No Pain Management and Medication Current  Pain Management: Electronic Signature(s) Signed: 02/07/2016 5:05:22 PM By: Gretta Cool, RN, BSN, Kim RN, BSN Entered By: Gretta Cool, RN, BSN, Kim on 02/07/2016 14:11:20 [name], [name] -------------------------------------------------------------------------------- Wound Assessment Details [name] Date of Service: 02/07/2016 2:15 PM Patient Name: G. Patient Account Number: [id number] Medical Record Treating RN: Cornell Barman [id number] Number: Other Clinician: Date of Birth/Sex: 03/03/1933 (82 y.o. Female) Treating [name], [name] Primary Care Physician/Extender: Marlou Starks Physician: Referring Physician: Jenelle Mages in Treatment: 0 Wound Status Wound Number: 1 Primary Abscess Etiology: Wound Location: Right Ilium Wound Open Wounding Event: Surgical Injury Status: Date Acquired: 11/29/2015 Comorbid Anemia, Hypertension, Osteomyelitis, Weeks Of Treatment: 0 History: Received Chemotherapy Clustered Wound: No Photos Photo Uploaded By: Gretta Cool, RN, BSN, Kim on  02/07/2016 15:52:48 Wound Measurements Length: (cm) Width: (cm) Depth: (cm) Area: (cm) Volume: (cm) 0.1 % Reduction in Area: 0.1 % Reduction in Volume: 4.2 0.008 0.033 Wound Description Full Thickness Without Exposed Classification: Support Structures Wound Margin: Indistinct, nonvisible Exudate Large Amount: Exudate Type: Purulent Exudate Color: yellow, brown, green Foul Odor After Cleansing: No Wound Bed Elizabeth, Hahn ([id number]) Exposed Structure Fascia Exposed: No Fat Layer Exposed: No Tendon Exposed: No Muscle Exposed: No Joint Exposed: No Bone Exposed: No Limited to Skin Breakdown Periwound Skin Texture Texture Color No Abnormalities Noted: No No Abnormalities Noted: No Callus: No Atrophie Blanche: No Crepitus: No Cyanosis: No Excoriation: No Ecchymosis: No Fluctuance: No Erythema: No Friable: No Hemosiderin Staining: No Induration: Yes Mottled: No Localized Edema: Yes Pallor: No Rash: No Rubor: No Scarring: No Moisture No Abnormalities Noted: No Dry / Scaly: No Maceration: No Moist: No Wound Preparation Ulcer Cleansing: Rinsed/Irrigated with Saline Treatment Notes Wound #1 (Right Ilium) 1. Cleansed with: Clean wound with Normal Saline 2. Anesthetic Topical Lidocaine 4% cream to wound bed prior to debridement 4. Dressing Applied: Aquacel Ag 5. Secondary Dressing Applied Bordered Foam Dressing Electronic Signature(s) Signed: 02/07/2016 5:05:22 PM By: Gretta Cool, RN, BSN, Kim RN, BSN Entered By: Gretta Cool, RN, BSN, Kim on 02/07/2016 14:30:04 [name], [name] -------------------------------------------------------------------------------- Vitals Details [name] Date of Service: 02/07/2016 2:15 PM Patient Name: G. Patient Account Number: [id number] Medical Record Treating RN: Cornell Barman [id number] Number: Other Clinician: Date of Birth/Sex: 04-20-1933 (82 y.o. Female) Treating [name], [name] Primary  Care Physician/Extender: Marlou Starks Physician: Referring Physician: Jenelle Mages in Treatment: 0 Vital Signs Time Taken: 14:11 Temperature (F): 98.4 Height (in): 61 Pulse (bpm): 70 Source: Stated Respiratory Rate (breaths/min): 18 Weight (lbs): 144 Blood Pressure (mmHg): 146/62 Source: Stated Reference Range: 80 - 120 mg / dl Body Mass Index (BMI): 27.2 Electronic Signature(s) Signed: 02/07/2016 5:05:22 PM By: Gretta Cool, RN, BSN, Kim RN, BSN Entered By: Gretta Cool, RN, BSN, Kim on 02/07/2016 14:12:08

## 2016-02-07 NOTE — Progress Notes (Signed)
AIREONA, JENKINS ([id number]) Visit Report for 02/07/2016 Chief Complaint Document Details SHAWNISHA, CSONKA Date of Service: 02/07/2016 2:15 PM Patient Name: G. Patient Account Number: [id number] Medical Record Treating RN: Ahmed Prima [id number] Number: Other Clinician: Date of Birth/Sex: 02-11-1933 (82 y.o. Female) Treating Dellia Nims, [name] Primary Care Physician/Extender: Marlou Starks Physician: Referring Physician: Jenelle Mages in Treatment: 0 Information Obtained from: Patient Chief Complaint 02/07/16; patient is here for review of a draining surgical abscess site on the right buttock Electronic Signature(s) Signed: 02/07/2016 4:19:04 PM By: Linton Ham MD Entered By: Linton Ham on 02/07/2016 15:44:00 Horgan, [name] ([id number]) -------------------------------------------------------------------------------- HPI Details [name] Date of Service: 02/07/2016 2:15 PM Patient Name: G. Patient Account Number: [id number] Medical Record Treating RN: Ahmed Prima [id number] Number: Other Clinician: Date of Birth/Sex: 1932-06-13 (82 y.o. Female) Treating [name], [name] Primary Care Physician/Extender: Marlou Starks Physician: Referring Physician: Jenelle Mages in Treatment: 0 History of Present Illness HPI Description: 02/07/16; this is a pleasant and functional 82 year old woman who is here for our review of a draining sinus from and abscess site on her right buttock. The history is that she noted a swelling in March that she could feel herself however there was no pain. Ultimately she was referred by her primary doctor to a general surgeon Dr. Jamal Collin. She underwent a drain placement on 4/20. The drain was removed on 5/11. I think she had an IandD on 5/26 with a second drain removed on 6/26. At that point the area had actually closed over however reopened on 7/4 with what the patient describes as a sudden  onset of pain and then she felt that the drainage. 2 cultures of this area did not culture anything either aerobically or anaerobically. There was a culture on 7/5 that showed a bacillus species. I don't think this was specifically targeted with antibiotics. He did receive a 10 day course of oral antibiotics at some point in this. She was also recently referred to a neurosurgeon in [place] Dr. Newman Pies. The issue here was that the patient has had multiple lower back surgeries and I think her general surgeon was concerned that this could be a source of infection for the abscess in the right buttock area. Her last CT scan was on 5/1. At that point she had a drain in place. She was noted to have a residual fluid collection measuring 2.2 x 1.5 in the right gluteal muscles. She was referred here by Dr. Ouida Sills who is her primary physician. I note that he is also referred her to infectious disease which seems reasonable. The patient states that she is not been systemically unwell no fever no chills. She continues to be active. Area is not painful and really never has been except for the recent reopening in early July. Electronic Signature(s) Signed: 02/07/2016 4:19:04 PM By: Linton Ham MD Entered By: Linton Ham on 02/07/2016 15:51:50 Rhyner, [name] ([id number]) -------------------------------------------------------------------------------- Physical Exam Details [name] Date of Service: 02/07/2016 2:15 PM Patient Name: G. Patient Account Number: [id number] Medical Record Treating RN: Ahmed Prima [id number] Number: Other Clinician: Date of Birth/Sex: 1933-05-23 (82 y.o. Female) Treating Dellia Nims, [name] Primary Care Physician/Extender: Marlou Starks Physician: Referring Physician: Jenelle Mages in Treatment: 0 Constitutional Sitting or standing Blood Pressure is within target range for patient.. Pulse regular and within target  range for patient.[name] Respirations regular, non-labored and within target range.. Temperature is normal and within the target range for the patient.. Patient's appearance is neat and clean. Appears  in no acute distress. Well nourished and well developed.. Eyes Conjunctivae clear. No discharge. No scleral icterus. Respiratory Respiratory effort is easy and symmetric bilaterally. Rate is normal at rest and on room air.. Bilateral breath sounds are clear and equal in all lobes with no wheezes, rales or rhonchi.. Cardiovascular 2/6 midsystolic ejection murmur that sounds benign. Bilateral soft carotid bruits. Lymphatic No lymphadenopathy or glandular swelling noted in groin.. Integumentary (Hair, Skin) There is no surrounding tenderness no crepitus. Psychiatric No evidence of depression, anxiety, or agitation. Calm, cooperative, and communicative. Appropriate interactions and affect.. Notes Wound exam; the patient has a small opening in the right buttock area however this probes deeply roughly 4 cm. There is obvious purulent drainage coming from this wound which I have elected to culture yet again. There is no evidence of surrounding infection no erythema tenderness or crepitus was noted. Electronic Signature(s) Signed: 02/07/2016 4:19:04 PM By: Linton Ham MD Entered By: Linton Ham on 02/07/2016 16:01:16 [place], [name] ([id number]) -------------------------------------------------------------------------------- Physician Orders Details [name] Date of Service: 02/07/2016 2:15 PM Patient Name: G. Patient Account Number: [id number] Medical Record Treating RN: Cornell Barman [id number] Number: Other Clinician: Date of Birth/Sex: [birth year] (82 y.o. Female) Treating Dellia Nims, [name] Primary Care Physician/Extender: Marlou Starks Physician: Referring Physician: Jenelle Mages in Treatment: 0 Verbal / Phone Orders: Yes Clinician: Cornell Barman Read Back  and Verified: Yes Diagnosis Coding Wound Cleansing Wound #1 Right Ilium o Clean wound with Normal Saline. Primary Wound Dressing o Other: - Aquacel Ag Rope Secondary Dressing Wound #1 Right Ilium o Boardered Foam Dressing Dressing Change Frequency Wound #1 Right Ilium o Change dressing every day. Follow-up Appointments Wound #1 Right Ilium o Return Appointment in 1 week. Laboratory o Bacteria identified in Wound by Culture (MICRO) - right illium oooo LOINC Code: [id number] oooo Convenience Name: Wound culture routine Electronic Signature(s) Signed: 02/07/2016 4:19:04 PM By: Linton Ham MD Signed: 02/07/2016 5:05:22 PM By: Gretta Cool RN, BSN, Kim RN, BSN Entered By: Gretta Cool, RN, BSN, Kim on 02/07/2016 14:51:03 Sillas, [name] ([id number]) -------------------------------------------------------------------------------- Problem List Details [name] Date of Service: 02/07/2016 2:15 PM Patient Name: G. Patient Account Number: [id number] Medical Record Treating RN: Ahmed Prima [id number] Number: Other Clinician: Date of Birth/Sex: February 07, 1933 (82 y.o. Female) Treating Dellia Nims, [name] Primary Care Physician/Extender: Marlou Starks Physician: Referring Physician: Jenelle Mages in Treatment: 0 Active Problems ICD-10 Encounter Code Description Active Date Diagnosis L02.31 Cutaneous abscess of buttock 02/07/2016 Yes T81.31XD Disruption of external operation (surgical) wound, not 02/07/2016 Yes elsewhere classified, subsequent encounter Inactive Problems Resolved Problems Electronic Signature(s) Signed: 02/07/2016 4:19:04 PM By: Linton Ham MD Entered By: Linton Ham on 02/07/2016 15:40:15 Krapf, [name] ([id number]) -------------------------------------------------------------------------------- Progress Note/History and Physical Details [name] Date of Service: 02/07/2016 2:15 PM Patient Name: G. Patient  Account Number: [id number] Medical Record Treating RN: Ahmed Prima [id number] Number: Other Clinician: Date of Birth/Sex: [birth year] (82 y.o. Female) Treating Dellia Nims, [name] Primary Care Physician/Extender: Marlou Starks Physician: Referring Physician: Jenelle Mages in Treatment: 0 Subjective Chief Complaint Information obtained from Patient 02/07/16; patient is here for review of a draining surgical abscess site on the right buttock History of Present Illness (HPI) 02/07/16; this is a pleasant and functional 82 year old woman who is here for our review of a draining sinus from and abscess site on her right buttock. The history is that she noted a swelling in March that she could feel herself however there was no pain. Ultimately she was referred by her primary doctor  to a general surgeon Dr. Jamal Collin. She underwent a drain placement on 4/20. The drain was removed on 5/11. I think she had an IandD on 5/26 with a second drain removed on 6/26. At that point the area had actually closed over however reopened on 7/4 with what the patient describes as a sudden onset of pain and then she felt that the drainage. 2 cultures of this area did not culture anything either aerobically or anaerobically. There was a culture on 7/5 that showed a bacillus species. I don't think this was specifically targeted with antibiotics. He did receive a 10 day course of oral antibiotics at some point in this. She was also recently referred to a neurosurgeon in [place] Dr. Newman Pies. The issue here was that the patient has had multiple lower back surgeries and I think her general surgeon was concerned that this could be a source of infection for the abscess in the right buttock area. Her last CT scan was on 5/1. At that point she had a drain in place. She was noted to have a residual fluid collection measuring 2.2 x 1.5 in the right gluteal muscles. She was referred here by Dr. Ouida Sills who  is her primary physician. I note that he is also referred her to infectious disease which seems reasonable. The patient states that she is not been systemically unwell no fever no chills. She continues to be active. Area is not painful and really never has been except for the recent reopening in early July. Wound History Patient presents with 1 open wound that has been present for approximately 4 months. Patient has been treating wound in the following manner: bandage and soap and water. The wound has been healed in the past but has re-opened. Laboratory tests have not been performed in the last month. Patient reportedly has not tested positive for an antibiotic resistant organism. Patient reportedly has not tested positive for osteomyelitis. Patient reportedly has not had testing performed to evaluate circulation in the legs. Patient experiences the following problems associated with their wounds: infection, swelling. Patient History BAILEA, MERLINO ([id number]) Information obtained from Patient. Allergies No Known Drug Allergies Family History Heart Disease - Father, Hypertension - Mother, No family history of Cancer, Diabetes, Kidney Disease, Lung Disease, Seizures, Stroke, Thyroid Problems, Tuberculosis. Social History Never smoker, Marital Status - Widowed, Alcohol Use - Never, Drug Use - No History, Caffeine Use - Never. Medical History Eyes Denies history of Cataracts, Glaucoma, Optic Neuritis Ear/Nose/Mouth/Throat Denies history of Chronic sinus problems/congestion, Middle ear problems Hematologic/Lymphatic Patient has history of Anemia Denies history of Hemophilia, Human Immunodeficiency Virus, Lymphedema, Sickle Cell Disease Respiratory Denies history of Aspiration, Asthma, Chronic Obstructive Pulmonary Disease (COPD), Pneumothorax, Sleep Apnea, Tuberculosis Cardiovascular Patient has history of Hypertension Denies history of Angina, Arrhythmia, Congestive Heart  Failure, Coronary Artery Disease, Deep Vein Thrombosis, Hypotension, Myocardial Infarction, Peripheral Arterial Disease, Peripheral Venous Disease, Phlebitis, Vasculitis Gastrointestinal Denies history of Cirrhosis , Colitis, Crohn s, Hepatitis A, Hepatitis B, Hepatitis C Endocrine Denies history of Type I Diabetes, Type II Diabetes Genitourinary Denies history of End Stage Renal Disease Immunological Denies history of Lupus Erythematosus, Raynaud s, Scleroderma Integumentary (Skin) Denies history of History of Burn, History of pressure wounds Musculoskeletal Patient has history of Osteomyelitis Denies history of Gout, Rheumatoid Arthritis, Osteoarthritis Neurologic Denies history of Dementia, Neuropathy, Quadriplegia, Paraplegia, Seizure Disorder Oncologic Patient has history of Received Chemotherapy Psychiatric Denies history of Anorexia/bulimia, Confinement Anxiety KASSIDEY, SUNDAHL ([id number]) Hospitalization/Surgery History - 10/22/2015, [facility], asscess. Medical  And Surgical History Notes Oncologic Bladder cancer VCG treatments Review of Systems (ROS) Constitutional Symptoms (General Health) The patient has no complaints or symptoms. Eyes Complains or has symptoms of Glasses / Contacts. Denies complaints or symptoms of Dry Eyes, Vision Changes. Ear/Nose/Mouth/Throat The patient has no complaints or symptoms. Hematologic/Lymphatic The patient has no complaints or symptoms. Respiratory The patient has no complaints or symptoms. Cardiovascular The patient has no complaints or symptoms. Gastrointestinal The patient has no complaints or symptoms. Endocrine The patient has no complaints or symptoms. Genitourinary The patient has no complaints or symptoms. Immunological The patient has no complaints or symptoms. Integumentary (Skin) Complains or has symptoms of Wounds. Denies complaints or symptoms of Bleeding or bruising tendency, Breakdown,  Swelling. Musculoskeletal The patient has no complaints or symptoms. Neurologic The patient has no complaints or symptoms. Oncologic The patient has no complaints or symptoms. Psychiatric The patient has no complaints or symptoms. Objective Constitutional Sitting or standing Blood Pressure is within target range for patient.. Pulse regular and within target range for patient.[name] Respirations regular, non-labored and within target range.. Temperature is normal and within EULIA, CHAPELL. ([id number]) the target range for the patient.. Patient's appearance is neat and clean. Appears in no acute distress. Well nourished and well developed.. Vitals Time Taken: 2:11 PM, Height: 61 in, Source: Stated, Weight: 144 lbs, Source: Stated, BMI: 27.2, Temperature: 98.4 F, Pulse: 70 bpm, Respiratory Rate: 18 breaths/min, Blood Pressure: 146/62 mmHg. Eyes Conjunctivae clear. No discharge. No scleral icterus. Respiratory Respiratory effort is easy and symmetric bilaterally. Rate is normal at rest and on room air.. Bilateral breath sounds are clear and equal in all lobes with no wheezes, rales or rhonchi.. Cardiovascular 2/6 midsystolic ejection murmur that sounds benign. Bilateral soft carotid bruits. Lymphatic No lymphadenopathy or glandular swelling noted in groin.[name] Psychiatric No evidence of depression, anxiety, or agitation. Calm, cooperative, and communicative. Appropriate interactions and affect.. General Notes: Wound exam; the patient has a small opening in the right buttock area however this probes deeply roughly 4 cm. There is obvious purulent drainage coming from this wound which I have elected to culture yet again. There is no evidence of surrounding infection no erythema tenderness or crepitus was noted. Integumentary (Hair, Skin) There is no surrounding tenderness no crepitus. Wound #1 status is Open. Original cause of wound was Surgical Injury. The wound is located on the  Right Ilium. The wound measures 0.1cm length x 0.1cm width x 4.2cm depth; 0.008cm^2 area and 0.033cm^3 volume. The wound is limited to skin breakdown. There is a large amount of purulent drainage noted. The wound margin is indistinct and nonvisible. The periwound skin appearance exhibited: Induration, Localized Edema. The periwound skin appearance did not exhibit: Callus, Crepitus, Excoriation, Fluctuance, Friable, Rash, Scarring, Dry/Scaly, Maceration, Moist, Atrophie Blanche, Cyanosis, Ecchymosis, Hemosiderin Staining, Mottled, Pallor, Rubor, Erythema. Assessment Active Problems ICD-10 L02.31 - Cutaneous abscess of buttock MERIKAY, FERRANTE. ([id number]) T81.31XD - Disruption of external operation (surgical) wound, not elsewhere classified, subsequent encounter Plan Wound Cleansing: Wound #1 Right Ilium: Clean wound with Normal Saline. Primary Wound Dressing: Other: - Aquacel Ag Rope Secondary Dressing: Wound #1 Right Ilium: Boardered Foam Dressing Dressing Change Frequency: Wound #1 Right Ilium: Change dressing every day. Follow-up Appointments: Wound #1 Right Ilium: Return Appointment in 1 week. Laboratory ordered were: Wound culture routine - right illium o #1 this is a small draining sinus from an abscess in the gluteal muscle area. Apparently after drainage of this initially with a drain and then subsequent only  with an IandD and a drain this closed over only to have it reopened after a short period of time. She is been referred here for management of this. About the only thing we can offer is some form of packing and I have elected to use silver alginate rope packing. However in my experience this type of open area usually requires a subsequent IandD. I note that she is been referred to infectious disease which seems reasonable. She did culture bacillus on 7/5 although this is often a contaminant rather than a true pathogen. I have done a culture of this area myself  today however I will wait till next week when it's back to further discuss antibiotics and if she is really going to infectious disease I'd prefer them to make the call on the preferred treatment. She will come in tomorrow with her son for [place] to show him how to do this at home. SAHANA, GIANNATTASIO ([id number]) Electronic Signature(s) Signed: 02/07/2016 4:19:04 PM By: Linton Ham MD Entered By: Linton Ham on 02/07/2016 16:05:04 [place], [name] ([id number]) -------------------------------------------------------------------------------- ROS/PFSH Details [name] Date of Service: 02/07/2016 2:15 PM Patient Name: G. Patient Account Number: [id number] Medical Record Treating RN: Cornell Barman [id number] Number: Other Clinician: Date of Birth/Sex: 05/05/1933 (82 y.o. Female) Treating [name], [name] Primary Care Physician/Extender: Marlou Starks Physician: Referring Physician: Jenelle Mages in Treatment: 0 Label Progress Note Print Version as History and Physical for this encounter Information Obtained From Patient Wound History Do you currently have one or more open woundso Yes How many open wounds do you currently haveo 1 Approximately how long have you had your woundso 4 months How have you been treating your wound(s) until nowo bandage and soap and water Has your wound(s) ever healed and then re-openedo Yes Have you had any lab work done in the past montho No Have you tested positive for an antibiotic resistant organism (MRSA, No VRE)o Have you tested positive for osteomyelitis (bone infection)o No Have you had any tests for circulation on your legso No Have you had other problems associated with your woundso Infection, Swelling Eyes Complaints and Symptoms: Positive for: Glasses / Contacts Negative for: Dry Eyes; Vision Changes Medical History: Negative for: Cataracts; Glaucoma; Optic Neuritis Ear/Nose/Mouth/Throat Complaints and  Symptoms: No Complaints or Symptoms Complaints and Symptoms: Negative for: Difficult clearing ears; Sinusitis Medical History: Negative for: Chronic sinus problems/congestion; Middle ear problems Endocrine ALETSE, BIONDOLILLO ([id number]) Complaints and Symptoms: No Complaints or Symptoms Complaints and Symptoms: Negative for: Hepatitis; Thyroid disease; Polydypsia (Excessive Thirst) Medical History: Negative for: Type I Diabetes; Type II Diabetes Integumentary (Skin) Complaints and Symptoms: Positive for: Wounds Negative for: Bleeding or bruising tendency; Breakdown; Swelling Medical History: Negative for: History of Burn; History of pressure wounds Constitutional Symptoms (General Health) Complaints and Symptoms: No Complaints or Symptoms Hematologic/Lymphatic Complaints and Symptoms: No Complaints or Symptoms Medical History: Positive for: Anemia Negative for: Hemophilia; Human Immunodeficiency Virus; Lymphedema; Sickle Cell Disease Respiratory Complaints and Symptoms: No Complaints or Symptoms Medical History: Negative for: Aspiration; Asthma; Chronic Obstructive Pulmonary Disease (COPD); Pneumothorax; Sleep Apnea; Tuberculosis Cardiovascular Complaints and Symptoms: No Complaints or Symptoms Medical History: Positive for: Hypertension Negative for: Angina; Arrhythmia; Congestive Heart Failure; Coronary Artery Disease; Deep Vein Thrombosis; Hypotension; Myocardial Infarction; Peripheral Arterial Disease; Peripheral Venous Disease; Phlebitis; Vasculitis VENITA, WEYMER. ([id number]) Gastrointestinal Complaints and Symptoms: No Complaints or Symptoms Medical History: Negative for: Cirrhosis ; Colitis; Crohnos; Hepatitis A; Hepatitis B; Hepatitis C Genitourinary Complaints and Symptoms: No Complaints or  Symptoms Medical History: Negative for: End Stage Renal Disease Immunological Complaints and Symptoms: No Complaints or Symptoms Medical  History: Negative for: Lupus Erythematosus; Raynaudos; Scleroderma Musculoskeletal Complaints and Symptoms: No Complaints or Symptoms Medical History: Positive for: Osteomyelitis Negative for: Gout; Rheumatoid Arthritis; Osteoarthritis Neurologic Complaints and Symptoms: No Complaints or Symptoms Medical History: Negative for: Dementia; Neuropathy; Quadriplegia; Paraplegia; Seizure Disorder Oncologic Complaints and Symptoms: No Complaints or Symptoms Medical History: Positive for: Received Chemotherapy Past Medical History Notes: Bladder cancer VCG treatments Psychiatric JORDIAN, COATE ([id number]) Complaints and Symptoms: No Complaints or Symptoms Medical History: Negative for: Anorexia/bulimia; Confinement Anxiety Immunizations Pneumococcal Vaccine: Received Pneumococcal Vaccination: Yes Hospitalization / Surgery History Name of Hospital Purpose of Hospitalization/Surgery Date [facility] asscess 10/22/2015 Family and Social History Cancer: No; Diabetes: No; Heart Disease: Yes - Father; Hypertension: Yes - Mother; Kidney Disease: No; Lung Disease: No; Seizures: No; Stroke: No; Thyroid Problems: No; Tuberculosis: No; Never smoker; Marital Status - Widowed; Alcohol Use: Never; Drug Use: No History; Caffeine Use: Never; Advanced Directives: Yes; Living Will: Yes; Medical Power of Attorney: Yes Electronic Signature(s) Signed: 02/07/2016 4:19:04 PM By: Linton Ham MD Signed: 02/07/2016 5:05:22 PM By: Gretta Cool RN, BSN, Kim RN, BSN Entered By: Gretta Cool, RN, BSN, Kim on 02/07/2016 14:20:25 [place], [name] ([id number]) -------------------------------------------------------------------------------- SuperBill Details [name] Date of Service: 02/07/2016 Patient Name: G. Patient Account Number: [id number] Medical Record Treating RN: Cornell Barman [id number] Number: Other Clinician: Date of Birth/Sex: Oct 10, 1932 (82 y.o. Female) Treating Dellia Nims, [name] Primary Care  Physician/Extender: Marlou Starks Physician: Suella Grove in Treatment: 0 Referring Physician: [name] Diagnosis Coding ICD-10 Codes Code Description L02.31 Cutaneous abscess of buttock Facility Procedures CPT4 Code: [id number] Description: [id number] - WOUND CARE VISIT-LEV 4 EST PT Modifier: Quantity: 1 Physician Procedures CPT4 Code: [id number] Description: WC PHYS LEVEL 3 o NEW PT ICD-10 Description Diagnosis L02.31 Cutaneous abscess of buttock Modifier: Quantity: 1 Electronic Signature(s) Signed: 02/07/2016 4:19:04 PM By: Linton Ham MD Entered By: Linton Ham on 02/07/2016 16:06:12

## 2016-02-08 ENCOUNTER — Telehealth: Payer: Self-pay | Admitting: General Surgery

## 2016-02-08 NOTE — Telephone Encounter (Signed)
PT CALLED TO SPEAK TO CARY-LYN & SHE WAS IN A ROOM WITH A PT. I LET THE PT KNOW I WOULD TAKE HER # AND HAVE CARYL-LYN RETURN HER  CALL.

## 2016-02-09 LAB — AEROBIC CULTURE  (SUPERFICIAL SPECIMEN): CULTURE: NO GROWTH

## 2016-02-09 LAB — AEROBIC CULTURE W GRAM STAIN (SUPERFICIAL SPECIMEN)

## 2016-02-10 ENCOUNTER — Other Ambulatory Visit: Payer: Self-pay | Admitting: Infectious Diseases

## 2016-02-10 DIAGNOSIS — C679 Malignant neoplasm of bladder, unspecified: Secondary | ICD-10-CM

## 2016-02-10 DIAGNOSIS — L0231 Cutaneous abscess of buttock: Secondary | ICD-10-CM

## 2016-02-14 ENCOUNTER — Encounter: Payer: Medicare Other | Admitting: Internal Medicine

## 2016-02-14 DIAGNOSIS — L0231 Cutaneous abscess of buttock: Secondary | ICD-10-CM | POA: Diagnosis not present

## 2016-02-14 NOTE — Progress Notes (Signed)
KAYLIEE, SKURKA ([id number]) Visit Report for 02/14/2016 Arrival Information Details DAWNELLE, RENBARGER Date of Service: 02/14/2016 2:15 PM Patient Name: G. Patient Account Number: [id number] Medical Record Treating RN: Baruch Gouty RN, BSN, Velva Harman [id number] Number: Other Clinician: Date of Birth/Sex: 1933/01/05 (82 y.o. Female) Treating Dellia Nims, [name] Primary Care Physician/Extender: Marlou Starks Physician: Referring Physician: Jenelle Mages in Treatment: 1 Visit Information History Since Last Visit All ordered tests and consults were completed: No Patient Arrived: Ambulatory Added or deleted any medications: No Arrival Time: 14:01 Any new allergies or adverse reactions: No Accompanied By: self Had a fall or experienced change in No Transfer Assistance: None activities of daily living that may affect Patient Identification Verified: Yes risk of falls: Secondary Verification Process Yes Signs or symptoms of abuse/neglect since last No Completed: visito Patient Has Alerts: Yes Hospitalized since last visit: No Has Dressing in Place as Prescribed: Yes Pain Present Now: No Electronic Signature(s) Signed: 02/14/2016 4:32:24 PM By: Regan Lemming BSN, RN Entered By: Regan Lemming on 02/14/2016 14:07:56 Briggs, [name] ([id number]) -------------------------------------------------------------------------------- Clinic Level of Care Assessment Details [name] Date of Service: 02/14/2016 2:15 PM Patient Name: G. Patient Account Number: [id number] Medical Record Treating RN: Baruch Gouty RN, BSN, Velva Harman [id number] Number: Other Clinician: Date of Birth/Sex: January 28, 1933 (82 y.o. Female) Treating [name], [name] Primary Care Physician/Extender: Marlou Starks Physician: Referring Physician: Jenelle Mages in Treatment: 1 Clinic Level of Care Assessment Items TOOL 4 Quantity Score []  - Use when only an EandM is performed on FOLLOW-UP visit  0 ASSESSMENTS - Nursing Assessment / Reassessment X - Reassessment of Co-morbidities (includes updates in patient status) 1 10 X - Reassessment of Adherence to Treatment Plan 1 5 ASSESSMENTS - Wound and Skin Assessment / Reassessment X - Simple Wound Assessment / Reassessment - one wound 1 5 []  - Complex Wound Assessment / Reassessment - multiple wounds 0 []  - Dermatologic / Skin Assessment (not related to wound area) 0 ASSESSMENTS - Focused Assessment []  - Circumferential Edema Measurements - multi extremities 0 []  - Nutritional Assessment / Counseling / Intervention 0 []  - Lower Extremity Assessment (monofilament, tuning fork, pulses) 0 []  - Peripheral Arterial Disease Assessment (using hand held doppler) 0 ASSESSMENTS - Ostomy and/or Continence Assessment and Care []  - Incontinence Assessment and Management 0 []  - Ostomy Care Assessment and Management (repouching, etc.) 0 PROCESS - Coordination of Care X - Simple Patient / Family Education for ongoing care 1 15 []  - Complex (extensive) Patient / Family Education for ongoing care 0 []  - Staff obtains Consents, Records, Test Results / Process Orders 0 Bahner, [name] ([id number]) []  - Staff telephones HHA, Nursing Homes / Clarify orders / etc 0 []  - Routine Transfer to another Facility (non-emergent condition) 0 []  - Routine Hospital Admission (non-emergent condition) 0 []  - New Admissions / Biomedical engineer / Ordering NPWT, Apligraf, etc. 0 []  - Emergency Hospital Admission (emergent condition) 0 []  - Simple Discharge Coordination 0 []  - Complex (extensive) Discharge Coordination 0 PROCESS - Special Needs []  - Pediatric / Minor Patient Management 0 []  - Isolation Patient Management 0 []  - Hearing / Language / Visual special needs 0 []  - Assessment of Community assistance (transportation, D/C planning, etc.) 0 []  - Additional assistance / Altered mentation 0 []  - Support Surface(s) Assessment (bed, cushion, seat,  etc.) 0 INTERVENTIONS - Wound Cleansing / Measurement X - Simple Wound Cleansing - one wound 1 5 []  - Complex Wound Cleansing - multiple wounds 0 X - Wound Imaging (photographs -  any number of wounds) 1 5 []  - Wound Tracing (instead of photographs) 0 X - Simple Wound Measurement - one wound 1 5 []  - Complex Wound Measurement - multiple wounds 0 INTERVENTIONS - Wound Dressings []  - Small Wound Dressing one or multiple wounds 0 []  - Medium Wound Dressing one or multiple wounds 0 []  - Large Wound Dressing one or multiple wounds 0 []  - Application of Medications - topical 0 []  - Application of Medications - injection 0 Ceesay, Conor G. ([id number]) INTERVENTIONS - Miscellaneous []  - External ear exam 0 []  - Specimen Collection (cultures, biopsies, blood, body fluids, etc.) 0 []  - Specimen(s) / Culture(s) sent or taken to Lab for analysis 0 []  - Patient Transfer (multiple staff / Harrel Lemon Lift / Similar devices) 0 []  - Simple Staple / Suture removal (25 or less) 0 []  - Complex Staple / Suture removal (26 or more) 0 []  - Hypo / Hyperglycemic Management (close monitor of Blood Glucose) 0 []  - Ankle / Brachial Index (ABI) - do not check if billed separately 0 X - Vital Signs 1 5 [name]: Yes Total Score: 55 Level Of Care: New/Established - Level 2 Electronic Signature(s) Signed: 02/14/2016 4:32:24 PM By: Regan Lemming BSN, RN Entered By: Regan Lemming on 02/14/2016 14:31:22 [place], [name] ([id number]) -------------------------------------------------------------------------------- Encounter Discharge Information Details [name] Date of Service: 02/14/2016 2:15 PM Patient Name: G. Patient Account Number: [id number] Medical Record Treating RN: Baruch Gouty RN, BSN, Velva Harman [id number] Number: Other Clinician: Date of Birth/Sex: [birth year] (82 y.o. Female) Treating Dellia Nims, [name] Primary Care Physician/Extender:  Marlou Starks Physician: Referring Physician: Jenelle Mages in Treatment: 1 Encounter Discharge Information Items Discharge Pain Level: 0 Discharge Condition: Stable Ambulatory Status: Ambulatory Discharge Destination: Home Transportation: Private Auto Accompanied By: self Schedule Follow-up Appointment: No Medication Reconciliation completed and provided to Patient/Care No Shivangi Lutz: Provided on Clinical Summary of Care: 02/14/2016 Form Type Recipient Paper Patient SM Electronic Signature(s) Signed: 02/14/2016 4:32:24 PM By: Regan Lemming BSN, RN Previous Signature: 02/14/2016 2:30:47 PM Version By: Ruthine Dose Previous Signature: 02/14/2016 2:30:05 PM Version By: Ruthine Dose Entered By: Regan Lemming on 02/14/2016 14:32:14 [place]rocher, [name] ([id number]) -------------------------------------------------------------------------------- Lower Extremity Assessment Details [name] Date of Service: 02/14/2016 2:15 PM Patient Name: G. Patient Account Number: [id number] Medical Record Treating RN: Baruch Gouty RN, BSN, Velva Harman [id number] Number: Other Clinician: Date of Birth/Sex: Apr 08, 1933 (82 y.o. Female) Treating [name], [name] Primary Care Physician/Extender: Marlou Starks Physician: Referring Physician: Jenelle Mages in Treatment: 1 Electronic Signature(s) Signed: 02/14/2016 4:32:24 PM By: Regan Lemming BSN, RN Entered By: Regan Lemming on 02/14/2016 14:09:01 [name] ([id number]) -------------------------------------------------------------------------------- Multi Wound Chart Details [name] Date of Service: 02/14/2016 2:15 PM Patient Name: G. Patient Account Number: [id number] Medical Record Treating RN: Baruch Gouty RN, BSN, Velva Harman [id number] Number: Other Clinician: Date of Birth/Sex: 1933/05/15 (82 y.o. Female) Treating Dellia Nims, [name] Primary Care Physician/Extender: Marlou Starks Physician: Referring  Physician: Jenelle Mages in Treatment: 1 Vital Signs Height(in): 61 Pulse(bpm): 62 Weight(lbs): 144 Blood Pressure 126/49 (mmHg): Body Mass Index(BMI): 27 Temperature(F): 98.2 Respiratory Rate 18 (breaths/min): Photos: [1:No Photos] [N/A:N/A] Wound Location: [1:Right Ilium] [N/A:N/A] Wounding Event: [1:Surgical Injury] [N/A:N/A] Primary Etiology: [1:Abscess] [N/A:N/A] Comorbid History: [1:Anemia, Hypertension, Osteomyelitis, Received Chemotherapy] [N/A:N/A] Date Acquired: [1:11/29/2015] [N/A:N/A] Weeks of Treatment: [1:1] [N/A:N/A] Wound Status: [1:Open] [N/A:N/A] Measurements L x W x D 0.4x0.6x3.4 [N/A:N/A] (cm) Area (cm) : [1:0.188] [N/A:N/A] Volume (cm) : [1:0.641] [N/A:N/A] % Reduction  in Area: [1:-2250.00%] [N/A:N/A] % Reduction in Volume: -1842.40% [N/A:N/A] Classification: [1:Full Thickness Without Exposed Support Structures] [N/A:N/A] Exudate Amount: [1:Large] [N/A:N/A] Exudate Type: [1:Serosanguineous] [N/A:N/A] Exudate Color: [1:red, brown] [N/A:N/A] Wound Margin: [1:Indistinct, nonvisible] [N/A:N/A] Granulation Amount: [1:Large (67-100%)] [N/A:N/A] Necrotic Amount: [1:None Present (0%)] [N/A:N/A] Exposed Structures: [1:Fascia: No Fat: No] [N/A:N/A] Tendon: No Muscle: No Joint: No Bone: No Limited to Skin Breakdown Epithelialization: None N/A N/A Periwound Skin Texture: Edema: Yes N/A N/A Induration: Yes Excoriation: No Callus: No Crepitus: No Fluctuance: No Friable: No Rash: No Scarring: No Periwound Skin Moist: Yes N/A N/A Moisture: Maceration: No Dry/Scaly: No Periwound Skin Color: Atrophie Blanche: No N/A N/A Cyanosis: No Ecchymosis: No Erythema: No Hemosiderin Staining: No Mottled: No Pallor: No Rubor: No Temperature: No Abnormality N/A N/A Tenderness on No N/A N/A Palpation: Wound Preparation: Ulcer Cleansing: N/A N/A Rinsed/Irrigated with Saline Topical Anesthetic Applied: None Treatment Notes Electronic  Signature(s) Signed: 02/14/2016 4:32:24 PM By: Regan Lemming BSN, RN Entered By: Regan Lemming on 02/14/2016 14:29:04 [name] ([id number]) -------------------------------------------------------------------------------- [place] Details [name] Date of Service: 02/14/2016 2:15 PM Patient Name: G. Patient Account Number: [id number] Medical Record Treating RN: Baruch Gouty RN, BSN, Velva Harman [id number] Number: Other Clinician: Date of Birth/Sex: [birth year] (82 y.o. Female) Treating Dellia Nims, [name] Primary Care Physician/Extender: Marlou Starks Physician: Referring Physician: Jenelle Mages in Treatment: 1 Active Inactive Orientation to the Wound Care Program Nursing Diagnoses: Knowledge deficit related to the wound healing center program Goals: Patient/caregiver will verbalize understanding of the [place] Program Date Initiated: 02/07/2016 Goal Status: Active Interventions: Provide education on orientation to the wound center Notes: Soft Tissue Infection Nursing Diagnoses: Impaired tissue integrity Potential for infection: soft tissue Goals: Patient will remain free of wound infection Date Initiated: 02/07/2016 Goal Status: Active Interventions: Assess signs and symptoms of infection every visit Notes: Wound/Skin Impairment Nursing Diagnoses: Impaired tissue integrity ERIK, MESAROS ([id number]) Goals: Patient/caregiver will verbalize understanding of skin care regimen Date Initiated: 02/07/2016 Goal Status: Active Interventions: Assess ulceration(s) every visit Notes: Electronic Signature(s) Signed: 02/14/2016 4:32:24 PM By: Regan Lemming BSN, RN Entered By: Regan Lemming on 02/14/2016 14:24:02 [name] ([id number]) -------------------------------------------------------------------------------- Pain Assessment Details [name] Date of Service: 02/14/2016 2:15 PM Patient Name: G.  Patient Account Number: [id number] Medical Record Treating RN: Baruch Gouty RN, BSN, Velva Harman [id number] Number: Other Clinician: Date of Birth/Sex: Jul 12, 1932 (82 y.o. Female) Treating Dellia Nims, [name] Primary Care Physician/Extender: Marlou Starks Physician: Referring Physician: Jenelle Mages in Treatment: 1 Active Problems Location of Pain Severity and Description of Pain Patient Has Paino No Site Locations With Dressing Change: No Pain Management and Medication Current Pain Management: Electronic Signature(s) Signed: 02/14/2016 4:32:24 PM By: Regan Lemming BSN, RN Entered By: Regan Lemming on 02/14/2016 14:08:08 [name] ([id number]) -------------------------------------------------------------------------------- Patient/Caregiver Education Details [name] Date of Service: 02/14/2016 2:15 PM Patient Name: G. Patient Account Number: [id number] Medical Record Treating RN: Baruch Gouty RN, BSN, Velva Harman [id number] Number: Other Clinician: Date of Birth/Gender: 08/07/1932 (82 y.o. Female) Treating Dellia Nims, [name] Primary Care Physician/Extender: Marlou Starks Physician: Suella [place] in Treatment: 1 Referring Physician: [name] Education Assessment Education Provided To: Patient Education Topics Provided Welcome To [facility]: Methods: Explain/Verbal Responses: State content correctly Electronic Signature(s) Signed: 02/14/2016 4:32:24 PM By: Regan Lemming BSN, RN Entered By: Regan Lemming on 02/14/2016 14:32:25 [name] ([id number]) -------------------------------------------------------------------------------- Wound Assessment Details [name] Date of Service: 02/14/2016 2:15 PM Patient Name: G. Patient Account Number: [id number] Medical Record Treating RN: Baruch Gouty, RN,  BSN, Velva Harman [id number] Number: Other Clinician: Date of Birth/Sex: Sep 04, 1932 (83 y.o. Female) Treating [name], [name] Primary Care  Physician/Extender: Marlou Starks Physician: Referring Physician: Jenelle Mages in Treatment: 1 Wound Status Wound Number: 1 Primary Abscess Etiology: Wound Location: Right Ilium Wound Open Wounding Event: Surgical Injury Status: Date Acquired: 11/29/2015 Comorbid Anemia, Hypertension, Osteomyelitis, Weeks Of Treatment: 1 History: Received Chemotherapy Clustered Wound: No Photos Photo Uploaded By: Regan Lemming on 02/14/2016 16:32:04 Wound Measurements Length: (cm) 0.4 Width: (cm) 0.6 Depth: (cm) 3.4 Area: (cm) 0.188 Volume: (cm) 0.641 % Reduction in Area: -2250% % Reduction in Volume: -1842.4% Epithelialization: None Tunneling: No Undermining: No Wound Description Full Thickness Without Exposed Classification: Support Structures Wound Margin: Indistinct, nonvisible Exudate Large Amount: Exudate Type: Serosanguineous Exudate Color: red, brown Foul Odor After Cleansing: No Wound Bed AYDIA, KUBLY. ([id number]) Granulation Amount: Large (67-100%) Exposed Structure Necrotic Amount: None Present (0%) Fascia Exposed: No Fat Layer Exposed: No Tendon Exposed: No Muscle Exposed: No Joint Exposed: No Bone Exposed: No Limited to Skin Breakdown Periwound Skin Texture Texture Color No Abnormalities Noted: No No Abnormalities Noted: No Callus: No Atrophie Blanche: No Crepitus: No Cyanosis: No Excoriation: No Ecchymosis: No Fluctuance: No Erythema: No Friable: No Hemosiderin Staining: No Induration: Yes Mottled: No Localized Edema: Yes Pallor: No Rash: No Rubor: No Scarring: No Temperature / Pain Moisture Temperature: No Abnormality No Abnormalities Noted: No Dry / Scaly: No Maceration: No Moist: Yes Wound Preparation Ulcer Cleansing: Rinsed/Irrigated with Saline Topical Anesthetic Applied: None Treatment Notes Wound #1 (Right Ilium) 1. Cleansed with: Clean wound with Normal Saline 4. Dressing Applied: Aquacel Ag 5.  Secondary Dressing Applied Bordered Foam Dressing Electronic Signature(s) Signed: 02/14/2016 4:32:24 PM By: Regan Lemming BSN, RN Entered By: Regan Lemming on 02/14/2016 14:23:54 [place], [name] ([id number]) -------------------------------------------------------------------------------- Vitals Details [name] Date of Service: 02/14/2016 2:15 PM Patient Name: G. Patient Account Number: [id number] Medical Record Treating RN: Baruch Gouty RN, BSN, Velva Harman [id number] Number: Other Clinician: Date of Birth/Sex: March 20, 1933 (82 y.o. Female) Treating [name], [name] Primary Care Physician/Extender: Marlou Starks Physician: Referring Physician: Jenelle Mages in Treatment: 1 Vital Signs Time Taken: 14:08 Temperature (F): 98.2 Height (in): 61 Pulse (bpm): 62 Weight (lbs): 144 Respiratory Rate (breaths/min): 18 Body Mass Index (BMI): 27.2 Blood Pressure (mmHg): 126/49 Reference Range: 80 - 120 mg / dl Electronic Signature(s) Signed: 02/14/2016 4:32:24 PM By: Regan Lemming BSN, RN Entered By: Regan Lemming on 02/14/2016 14:08:55

## 2016-02-14 NOTE — Progress Notes (Signed)
[name], [name] Visit Report for 02/14/2016 Chief Complaint Document Details [name], [name] Date of Service: 02/14/2016 2:15 PM Patient Name: G. Patient Account Number: [id number] Medical Record Treating RN: [name] [id number] Number: Other Clinician: Date of Birth/Sex: 06/01/1932 (82 y.o. Female) Treating [name], [name] Primary Care Physician/Extender: [name] Physician: Referring Physician: Jenelle Hahn in Treatment: 1 Information Obtained from: Patient Chief Complaint 02/07/16; patient is here for review of a draining surgical abscess site on the right buttock Electronic Signature(s) Signed: 02/14/2016 3:24:48 PM By: Elizabeth Ham MD Entered By: [name] on 02/14/2016 14:31:33 Hahn, Elizabeth Pitch ([id number]) -------------------------------------------------------------------------------- HPI Details [name] Date of Service: 02/14/2016 2:15 PM Patient Name: G. Patient Account Number: [id number] Medical Record Treating RN: [name] [id number] Number: Other Clinician: Date of Birth/Sex: Jul 21, 1932 (82 y.o. Female) Treating [name], [name] Primary Care Physician/Extender: [name] Physician: Referring Physician: Jenelle Hahn in Treatment: 1 History of Present Illness HPI Description: 02/07/16; this is a pleasant and functional 82 year old woman who is here for our review of a draining sinus from and abscess site on her right buttock. The history is that she noted a swelling in March that she could feel herself however there was no pain. Ultimately she was referred by her primary doctor to a general surgeon Dr. Jamal Hahn. She underwent a drain placement on 4/20. The drain was removed on 5/11. I think she had an IandD on 5/26 with a second drain removed on 6/26. At that point the area had actually closed over however reopened on 7/4 with what the patient describes as a sudden  onset of pain and then she felt that the drainage. 2 cultures of this area did not culture anything either aerobically or anaerobically. There was a culture on 7/5 that showed a bacillus species. I don't think this was specifically targeted with antibiotics. He did receive a 10 day course of oral antibiotics at some point in this. She was also recently referred to a neurosurgeon in [place] [name]. The issue here was that the patient has had multiple lower back surgeries and I think her general surgeon was concerned that this could be a source of infection for the abscess in the right buttock area. Her last CT scan was on 5/1. At that point she had a drain in place. She was noted to have a residual fluid collection measuring 2.2 x 1.5 in the right gluteal muscles. She was referred here by Dr. Ouida Hahn who is her primary physician. I note that she is also referred her to infectious disease which seems reasonable. The patient states that she is not been systemically unwell no fever no chills. She continues to be active. Area is not painful and really never has been except for the recent reopening in early July. 02/14/16; the culture I did of this area last week was negative. The area measures 3.3 cm in depth we have been using silver alginate rope packing Electronic Signature(s) Signed: 02/14/2016 3:24:48 PM By: Elizabeth Ham MD Entered By: [name] on 02/14/2016 14:33:13 Hahn, Elizabeth Pitch ([id number]) -------------------------------------------------------------------------------- Physical Exam Details [name] Date of Service: 02/14/2016 2:15 PM Patient Name: G. Patient Account Number: [id number] Medical Record Treating RN: [name] [id number] Number: Other Clinician: Date of Birth/Sex: [birth year] (82 y.o. Female) Treating [name], [name] Primary Care Physician/Extender: [name] Physician: Referring Physician: Jenelle Hahn in Treatment: 1 Constitutional Sitting or standing Blood Pressure is within target range for patient.. Pulse regular and within target range  for patient.[name] Respirations regular, non-labored and within target range.. Temperature is normal and within the target range for the patient.. Patient's appearance is neat and clean. Appears in no acute distress. Well nourished and well developed.. Notes Wound exam; the patient has a small opening in the right buttock. This probes to 3.3 cm down from 4 cm last week. Otherwise there is no specific tenderness or drainage Electronic Signature(s) Signed: 02/14/2016 3:24:48 PM By: Elizabeth Ham MD Entered By: [name] on 02/14/2016 14:34:27 Hahn, Elizabeth Pitch ([id number]) -------------------------------------------------------------------------------- Physician Orders Details [name] Date of Service: 02/14/2016 2:15 PM Patient Name: G. Patient Account Number: [id number] Medical Record Treating RN: Elizabeth Gouty RN, BSN, [name] [id number] Number: Other Clinician: Date of Birth/Sex: 09/12/1932 (82 y.o. Female) Treating [name], [name] Primary Care Physician/Extender: [name] Physician: Referring Physician: Jenelle Hahn in Treatment: 1 Verbal / Phone Orders: Yes Clinician: Afful, RN, BSN, Elizabeth Read Back and Verified: Yes Diagnosis Coding Wound Cleansing Wound #1 Right Ilium o Clean wound with Normal Saline. Primary Wound Dressing o Other: - Aquacel Ag Rope Secondary Dressing Wound #1 Right Ilium o Boardered Foam Dressing Dressing Change Frequency Wound #1 Right Ilium o Change dressing every day. Follow-up Appointments Wound #1 Right Ilium o Return Appointment in 1 week. Electronic Signature(s) Signed: 02/14/2016 3:24:48 PM By: Elizabeth Ham MD Signed: 02/14/2016 4:32:24 PM By: [name] BSN, RN Entered By: [name] on 02/14/2016 14:30:57 Hahn, Elizabeth Pitch  ([id number]) -------------------------------------------------------------------------------- Problem List Details [name] Date of Service: 02/14/2016 2:15 PM Patient Name: G. Patient Account Number: [id number] Medical Record Treating RN: [name] [id number] Number: Other Clinician: Date of Birth/Sex: 1932-12-17 (82 y.o. Female) Treating [name], [name] Primary Care Physician/Extender: [name] Physician: Referring Physician: Jenelle Hahn in Treatment: 1 Active Problems ICD-10 Encounter Code Description Active Date Diagnosis L02.31 Cutaneous abscess of buttock 02/07/2016 Yes T81.31XD Disruption of external operation (surgical) wound, not 02/07/2016 Yes elsewhere classified, subsequent encounter Inactive Problems Resolved Problems Electronic Signature(s) Signed: 02/14/2016 3:24:48 PM By: Elizabeth Ham MD Entered By: [name] on 02/14/2016 14:31:16 Farnham, Elizabeth Pitch ([id number]) -------------------------------------------------------------------------------- Progress Note Details [name] Date of Service: 02/14/2016 2:15 PM Patient Name: G. Patient Account Number: [id number] Medical Record Treating RN: [name] [id number] Number: Other Clinician: Date of Birth/Sex: 06/15/1932 (82 y.o. Female) Treating [name], [name] Primary Care Physician/Extender: [name] Physician: Referring Physician: Jenelle Hahn in Treatment: 1 Subjective Chief Complaint Information obtained from Patient 02/07/16; patient is here for review of a draining surgical abscess site on the right buttock History of Present Illness (HPI) 02/07/16; this is a pleasant and functional 82 year old woman who is here for our review of a draining sinus from and abscess site on her right buttock. The history is that she noted a swelling in March that she could feel herself however there was no pain. Ultimately she was referred  by her primary doctor to a general surgeon Dr. Jamal Hahn. She underwent a drain placement on 4/20. The drain was removed on 5/11. I think she had an IandD on 5/26 with a second drain removed on 6/26. At that point the area had actually closed over however reopened on 7/4 with what the patient describes as a sudden onset of pain and then she felt that the drainage. 2 cultures of this area did not culture anything either aerobically or anaerobically. There was a culture on 7/5 that showed a bacillus species. I don't think this was specifically targeted with antibiotics. He did receive a 10 day course  of oral antibiotics at some point in this. She was also recently referred to a neurosurgeon in [place] [name]. The issue here was that the patient has had multiple lower back surgeries and I think her general surgeon was concerned that this could be a source of infection for the abscess in the right buttock area. Her last CT scan was on 5/1. At that point she had a drain in place. She was noted to have a residual fluid collection measuring 2.2 x 1.5 in the right gluteal muscles. She was referred here by Dr. Ouida Hahn who is her primary physician. I note that she is also referred her to infectious disease which seems reasonable. The patient states that she is not been systemically unwell no fever no chills. She continues to be active. Area is not painful and really never has been except for the recent reopening in early July. 02/14/16; the culture I did of this area last week was negative. The area measures 3.3 cm in depth we have been using silver alginate rope packing Objective Constitutional [name], [name]. ([id number]) Sitting or standing Blood Pressure is within target range for patient.. Pulse regular and within target range for patient.[name] Respirations regular, non-labored and within target range.. Temperature is normal and within the target range for the patient.. Patient's  appearance is neat and clean. Appears in no acute distress. Well nourished and well developed.. Vitals Time Taken: 2:08 PM, Height: 61 in, Weight: 144 lbs, BMI: 27.2, Temperature: 98.2 F, Pulse: 62 bpm, Respiratory Rate: 18 breaths/min, Blood Pressure: 126/49 mmHg. General Notes: Wound exam; the patient has a small opening in the right buttock. This probes to 3.3 cm down from 4 cm last week. Otherwise there is no specific tenderness or drainage Integumentary (Hair, Skin) Wound #1 status is Open. Original cause of wound was Surgical Injury. The wound is located on the Right Ilium. The wound measures 0.4cm length x 0.6cm width x 3.4cm depth; 0.188cm^2 area and 0.641cm^3 volume. The wound is limited to skin breakdown. There is no tunneling or undermining noted. There is a large amount of serosanguineous drainage noted. The wound margin is indistinct and nonvisible. There is large (67-100%) granulation within the wound bed. There is no necrotic tissue within the wound bed. The periwound skin appearance exhibited: Induration, Localized Edema, Moist. The periwound skin appearance did not exhibit: Callus, Crepitus, Excoriation, Fluctuance, Friable, Rash, Scarring, Dry/Scaly, Maceration, Atrophie Blanche, Cyanosis, Ecchymosis, Hemosiderin Staining, Mottled, Pallor, Rubor, Erythema. Periwound temperature was noted as No Abnormality. Assessment Active Problems ICD-10 L02.31 - Cutaneous abscess of buttock T81.31XD - Disruption of external operation (surgical) wound, not elsewhere classified, subsequent encounter Plan Wound Cleansing: Wound #1 Right Ilium: Clean wound with Normal Saline. Primary Wound Dressing: Other: - Aquacel Ag Rope Secondary Dressing: Wound #1 Right Ilium: SHATORRIA, OFARRELL ([id number]) Boardered Foam Dressing Dressing Change Frequency: Wound #1 Right Ilium: Change dressing every day. Follow-up Appointments: Wound #1 Right Ilium: Return Appointment in 1 week. #1  some improvement in the overall wound depth. #2 culture was negative, no additional antibiotics at this point. #3 I would like to get this down to appointment where we could use Iodosorb Electronic Signature(s) Signed: 02/14/2016 3:24:48 PM By: Elizabeth Ham MD Entered By: [name] on 02/14/2016 14:35:26 Bye, Elizabeth Pitch ([id number]) -------------------------------------------------------------------------------- SuperBill Details [name] Date of Service: 02/14/2016 Patient Name: G. Patient Account Number: [id number] Medical Record Treating RN: [name] [id number] Number: Other Clinician: Date of Birth/Sex: [birth year] (82 y.o. Female) Treating [name], [name] Primary  Care Physician/Extender: [name] Physician: Suella Grove in Treatment: 1 Referring Physician: [name] Diagnosis Coding ICD-10 Codes Code Description L02.31 Cutaneous abscess of buttock Disruption of external operation (surgical) wound, not elsewhere classified, subsequent T81.31XD encounter Facility Procedures CPT4 Code: [id number] Description: [phone] - WOUND CARE VISIT-LEV 2 EST PT Modifier: Quantity: 1 Physician Procedures CPT4 Code: [id number] Description: [id number] - WC PHYS LEVEL 2 - EST PT ICD-10 Description Diagnosis L02.31 Cutaneous abscess of buttock Modifier: Quantity: 1 Electronic Signature(s) Signed: 02/14/2016 3:24:48 PM By: Elizabeth Ham MD Entered By: [name] on 02/14/2016 14:36:21

## 2016-02-16 ENCOUNTER — Ambulatory Visit
Admission: RE | Admit: 2016-02-16 | Discharge: 2016-02-16 | Disposition: A | Discharge status: Home / Self Care | Payer: Medicare Other | Source: Ambulatory Visit | Attending: Infectious Diseases | Admitting: Infectious Diseases

## 2016-02-16 DIAGNOSIS — S2242XA Multiple fractures of ribs, left side, initial encounter for closed fracture: Secondary | ICD-10-CM | POA: Insufficient documentation

## 2016-02-16 DIAGNOSIS — X58XXXA Exposure to other specified factors, initial encounter: Secondary | ICD-10-CM | POA: Insufficient documentation

## 2016-02-16 DIAGNOSIS — C679 Malignant neoplasm of bladder, unspecified: Secondary | ICD-10-CM | POA: Diagnosis present

## 2016-02-16 DIAGNOSIS — I700 Atherosclerosis of aorta: Secondary | ICD-10-CM | POA: Diagnosis not present

## 2016-02-16 DIAGNOSIS — I2510 Atherosclerotic heart disease of native coronary artery without angina pectoris: Secondary | ICD-10-CM | POA: Insufficient documentation

## 2016-02-16 DIAGNOSIS — I708 Atherosclerosis of other arteries: Secondary | ICD-10-CM | POA: Diagnosis not present

## 2016-02-16 DIAGNOSIS — L0231 Cutaneous abscess of buttock: Secondary | ICD-10-CM | POA: Diagnosis present

## 2016-02-16 DIAGNOSIS — K209 Esophagitis, unspecified: Secondary | ICD-10-CM | POA: Diagnosis not present

## 2016-02-16 DIAGNOSIS — Z9889 Other specified postprocedural states: Secondary | ICD-10-CM | POA: Diagnosis not present

## 2016-02-16 MED ORDER — IOPAMIDOL (ISOVUE-300) INJECTION 61%
100.0000 mL | Freq: Once | INTRAVENOUS | Status: AC | PRN
  Administered 2016-02-16: 100 mL via INTRAVENOUS

## 2016-02-21 ENCOUNTER — Encounter: Payer: Medicare Other | Admitting: Internal Medicine

## 2016-02-21 ENCOUNTER — Other Ambulatory Visit: Payer: Self-pay | Admitting: Infectious Diseases

## 2016-02-21 DIAGNOSIS — L0231 Cutaneous abscess of buttock: Secondary | ICD-10-CM

## 2016-02-21 DIAGNOSIS — K6812 Psoas muscle abscess: Secondary | ICD-10-CM | POA: Insufficient documentation

## 2016-02-22 ENCOUNTER — Ambulatory Visit
Admission: RE | Admit: 2016-02-22 | Discharge: 2016-02-22 | Disposition: A | Discharge status: Home / Self Care | Payer: Medicare Other | Source: Ambulatory Visit | Attending: Infectious Diseases | Admitting: Infectious Diseases

## 2016-02-22 DIAGNOSIS — Z95828 Presence of other vascular implants and grafts: Secondary | ICD-10-CM

## 2016-02-22 DIAGNOSIS — K6812 Psoas muscle abscess: Secondary | ICD-10-CM | POA: Insufficient documentation

## 2016-02-22 DIAGNOSIS — L0231 Cutaneous abscess of buttock: Secondary | ICD-10-CM | POA: Diagnosis not present

## 2016-02-22 DIAGNOSIS — Z792 Long term (current) use of antibiotics: Secondary | ICD-10-CM | POA: Insufficient documentation

## 2016-02-22 DIAGNOSIS — I517 Cardiomegaly: Secondary | ICD-10-CM | POA: Insufficient documentation

## 2016-02-22 DIAGNOSIS — Z9889 Other specified postprocedural states: Secondary | ICD-10-CM | POA: Insufficient documentation

## 2016-02-22 DIAGNOSIS — M4806 Spinal stenosis, lumbar region: Secondary | ICD-10-CM | POA: Diagnosis not present

## 2016-02-22 MED ORDER — GADOBENATE DIMEGLUMINE 529 MG/ML IV SOLN
15.0000 mL | Freq: Once | INTRAVENOUS | Status: AC | PRN
  Administered 2016-02-22: 13 mL via INTRAVENOUS

## 2016-02-22 MED ORDER — SODIUM CHLORIDE FLUSH 0.9 % IV SOLN
INTRAVENOUS | Status: AC
  Filled 2016-02-22: qty 60

## 2016-02-22 NOTE — Progress Notes (Signed)
[name], [name] Visit Report for 02/21/2016 Arrival Information Details GLEN, JOW Date of Service: 02/21/2016 11:00 AM Patient Name: G. Patient Account Number: [id number] Medical Record Treating RN: Montey Hora [id number] Number: Other Clinician: Date of Birth/Sex: 03/10/1933 (82 y.o. Female) Treating Dellia Nims, [name] Primary Care Physician/Extender: Marlou Starks Physician: Referring Physician: Jenelle Mages in Treatment: 2 Visit Information History Since Last Visit Added or deleted any medications: No Patient Arrived: Ambulatory Any new allergies or adverse reactions: No Arrival Time: 11:19 Had a fall or experienced change in No Accompanied By: self activities of daily living that may affect Transfer Assistance: None risk of falls: Patient Identification Verified: Yes Signs or symptoms of abuse/neglect since last No Secondary Verification Process Yes visito Completed: Hospitalized since last visit: No Patient Has Alerts: Yes Pain Present Now: No Electronic Signature(s) Signed: 02/21/2016 5:05:30 PM By: Montey Hora Entered By: Montey Hora on 02/21/2016 11:19:39 [place], [name] -------------------------------------------------------------------------------- Clinic Level of Care Assessment Details [name] Date of Service: 02/21/2016 11:00 AM Patient Name: G. Patient Account Number: [id number] Medical Record Treating RN: Montey Hora [id number] Number: Other Clinician: Date of Birth/Sex: January 17, 1933 (82 y.o. Female) Treating [name], [name] Primary Care Physician/Extender: Marlou Starks Physician: Referring Physician: Jenelle Mages in Treatment: 2 Clinic Level of Care Assessment Items TOOL 4 Quantity Score []  - Use when only an EandM is performed on FOLLOW-UP visit 0 ASSESSMENTS - Nursing Assessment / Reassessment X - Reassessment of Co-morbidities (includes updates in  patient status) 1 10 X - Reassessment of Adherence to Treatment Plan 1 5 ASSESSMENTS - Wound and Skin Assessment / Reassessment X - Simple Wound Assessment / Reassessment - one wound 1 5 []  - Complex Wound Assessment / Reassessment - multiple wounds 0 []  - Dermatologic / Skin Assessment (not related to wound area) 0 ASSESSMENTS - Focused Assessment []  - Circumferential Edema Measurements - multi extremities 0 []  - Nutritional Assessment / Counseling / Intervention 0 []  - Lower Extremity Assessment (monofilament, tuning fork, pulses) 0 []  - Peripheral Arterial Disease Assessment (using hand held doppler) 0 ASSESSMENTS - Ostomy and/or Continence Assessment and Care []  - Incontinence Assessment and Management 0 []  - Ostomy Care Assessment and Management (repouching, etc.) 0 PROCESS - Coordination of Care X - Simple Patient / Family Education for ongoing care 1 15 []  - Complex (extensive) Patient / Family Education for ongoing care 0 []  - Staff obtains Consents, Records, Test Results / Process Orders 0 [name], [name] []  - Staff telephones HHA, Nursing Homes / Clarify orders / etc 0 []  - Routine Transfer to another Facility (non-emergent condition) 0 []  - Routine Hospital Admission (non-emergent condition) 0 []  - New Admissions / Biomedical engineer / Ordering NPWT, Apligraf, etc. 0 []  - Emergency Hospital Admission (emergent condition) 0 X - Simple Discharge Coordination 1 10 []  - Complex (extensive) Discharge Coordination 0 PROCESS - Special Needs []  - Pediatric / Minor Patient Management 0 []  - Isolation Patient Management 0 []  - Hearing / Language / Visual special needs 0 []  - Assessment of Community assistance (transportation, D/C planning, etc.) 0 []  - Additional assistance / Altered mentation 0 []  - Support Surface(s) Assessment (bed, cushion, seat, etc.) 0 INTERVENTIONS - Wound Cleansing / Measurement X - Simple Wound Cleansing - one wound 1 5 []  -  Complex Wound Cleansing - multiple wounds 0 X - Wound Imaging (photographs - any number of wounds) 1 5 []  - Wound Tracing (instead of photographs) 0 X - Simple Wound Measurement -  one wound 1 5 []  - Complex Wound Measurement - multiple wounds 0 INTERVENTIONS - Wound Dressings X - Small Wound Dressing one or multiple wounds 1 10 []  - Medium Wound Dressing one or multiple wounds 0 []  - Large Wound Dressing one or multiple wounds 0 []  - Application of Medications - topical 0 []  - Application of Medications - injection 0 [name], Elizabeth G. ([id number]) INTERVENTIONS - Miscellaneous []  - External ear exam 0 []  - Specimen Collection (cultures, biopsies, blood, body fluids, etc.) 0 []  - Specimen(s) / Culture(s) sent or taken to Lab for analysis 0 []  - Patient Transfer (multiple staff / Harrel Lemon Lift / Similar devices) 0 []  - Simple Staple / Suture removal (25 or less) 0 []  - Complex Staple / Suture removal (26 or more) 0 []  - Hypo / Hyperglycemic Management (close monitor of Blood Glucose) 0 []  - Ankle / Brachial Index (ABI) - do not check if billed separately 0 X - Vital Signs 1 5 [name]: Yes Total Score: 75 Level Of Care: New/Established - Level 2 Electronic Signature(s) Signed: 02/21/2016 5:05:30 PM By: Montey Hora Entered By: Montey Hora on 02/21/2016 12:03:06 [name] -------------------------------------------------------------------------------- Encounter Discharge Information Details [name] Date of Service: 02/21/2016 11:00 AM Patient Name: G. Patient Account Number: [id number] Medical Record Treating RN: Montey Hora [id number] Number: Other Clinician: Date of Birth/Sex: 1933-03-17 (82 y.o. Female) Treating Dellia Nims, [name] Primary Care Physician/Extender: Marlou Starks Physician: Referring Physician: Jenelle Mages in Treatment: 2 Encounter Discharge  Information Items Discharge Pain Level: 0 Discharge Condition: Stable Ambulatory Status: Ambulatory Discharge Destination: Home Transportation: Private Auto Accompanied By: self Schedule Follow-up Appointment: Yes Medication Reconciliation completed and provided to Patient/Care No Cruzito Standre: Provided on Clinical Summary of Care: 02/21/2016 Form Type Recipient Paper Patient SM Electronic Signature(s) Signed: 02/21/2016 12:03:47 PM By: Montey Hora Previous Signature: 02/21/2016 11:46:00 AM Version By: Ruthine Dose Entered By: Montey Hora on 02/21/2016 12:03:46 [name], [name] -------------------------------------------------------------------------------- Multi Wound Chart Details [name] Date of Service: 02/21/2016 11:00 AM Patient Name: G. Patient Account Number: [id number] Medical Record Treating RN: Montey Hora [id number] Number: Other Clinician: Date of Birth/Sex: 1933-01-12 (82 y.o. Female) Treating Dellia Nims, [name] Primary Care Physician/Extender: Marlou Starks Physician: Referring Physician: Jenelle Mages in Treatment: 2 Vital Signs Height(in): 61 Pulse(bpm): 62 Weight(lbs): 144 Blood Pressure 125/85 (mmHg): Body Mass Index(BMI): 27 Temperature(F): 98.2 Respiratory Rate 18 (breaths/min): Photos: [1:No Photos] [N/A:N/A] Wound Location: [1:Right Ilium] [N/A:N/A] Wounding Event: [1:Surgical Injury] [N/A:N/A] Primary Etiology: [1:Abscess] [N/A:N/A] Comorbid History: [1:Anemia, Hypertension, Osteomyelitis, Received Chemotherapy] [N/A:N/A] Date Acquired: [1:11/29/2015] [N/A:N/A] Weeks of Treatment: [1:2] [N/A:N/A] Wound Status: [1:Open] [N/A:N/A] Measurements L x W x D 0.3x0.7x4.5 [N/A:N/A] (cm) Area (cm) : [1:0.165] [N/A:N/A] Volume (cm) : [1:0.742] [N/A:N/A] % Reduction in Area: [1:-1962.50%] [N/A:N/A] % Reduction in Volume: -2148.50% [N/A:N/A] Classification: [1:Full Thickness Without Exposed Support  Structures] [N/A:N/A] Exudate Amount: [1:Large] [N/A:N/A] Exudate Type: [1:Purulent] [N/A:N/A] Exudate Color: [1:yellow, brown, green] [N/A:N/A] Wound Margin: [1:Indistinct, nonvisible] [N/A:N/A] Granulation Amount: [1:Medium (34-66%)] [N/A:N/A] Granulation Quality: [1:Pink] [N/A:N/A] Necrotic Amount: [1:Medium (34-66%)] [N/A:N/A] Exposed Structures: [N/A:N/A] Fascia: No Fat: No Tendon: No Muscle: No Joint: No Bone: No Limited to Skin Breakdown Epithelialization: None N/A N/A Periwound Skin Texture: Edema: Yes N/A N/A Induration: Yes Excoriation: No Callus: No Crepitus: No Fluctuance: No Friable: No Rash: No Scarring: No Periwound Skin Moist: Yes N/A N/A Moisture: Maceration: No Dry/Scaly: No Periwound Skin Color: Atrophie Blanche: No N/A  N/A Cyanosis: No Ecchymosis: No Erythema: No Hemosiderin Staining: No Mottled: No Pallor: No Rubor: No Temperature: No Abnormality N/A N/A Tenderness on No N/A N/A Palpation: Wound Preparation: Ulcer Cleansing: N/A N/A Rinsed/Irrigated with Saline Topical Anesthetic Applied: None Treatment Notes Electronic Signature(s) Signed: 02/21/2016 5:05:30 PM By: Montey Hora Entered By: Montey Hora on 02/21/2016 11:37:52 [name], [name] ([id number]) -------------------------------------------------------------------------------- [place] Details [name] Date of Service: 02/21/2016 11:00 AM Patient Name: G. Patient Account Number: [id number] Medical Record Treating RN: Montey Hora [id number] Number: Other Clinician: Date of Birth/Sex: 1932-09-18 (82 y.o. Female) Treating Dellia Nims, [name] Primary Care Physician/Extender: Marlou Starks Physician: Referring Physician: Jenelle Mages in Treatment: 2 Active Inactive Orientation to the Wound Care Program Nursing Diagnoses: Knowledge deficit related to the wound healing center program Goals: Patient/caregiver will  verbalize understanding of the [place] Program Date Initiated: 02/07/2016 Goal Status: Active Interventions: Provide education on orientation to the wound center Notes: Soft Tissue Infection Nursing Diagnoses: Impaired tissue integrity Potential for infection: soft tissue Goals: Patient will remain free of wound infection Date Initiated: 02/07/2016 Goal Status: Active Interventions: Assess signs and symptoms of infection every visit Notes: Wound/Skin Impairment Nursing Diagnoses: Impaired tissue integrity [name], [name] ([id number]) Goals: Patient/caregiver will verbalize understanding of skin care regimen Date Initiated: 02/07/2016 Goal Status: Active Interventions: Assess ulceration(s) every visit Notes: Electronic Signature(s) Signed: 02/21/2016 5:05:30 PM By: Montey Hora Entered By: Montey Hora on 02/21/2016 11:37:42 [name], [name] ([id number]) -------------------------------------------------------------------------------- Pain Assessment Details [name] Date of Service: 02/21/2016 11:00 AM Patient Name: G. Patient Account Number: [id number] Medical Record Treating RN: Montey Hora [id number] Number: Other Clinician: Date of Birth/Sex: 1933-03-27 (82 y.o. Female) Treating Dellia Nims, [name] Primary Care Physician/Extender: Marlou Starks Physician: Referring Physician: Jenelle Mages in Treatment: 2 Active Problems Location of Pain Severity and Description of Pain Patient Has Paino No Site Locations Pain Management and Medication Current Pain Management: Electronic Signature(s) Signed: 02/21/2016 5:05:30 PM By: Montey Hora Entered By: Montey Hora on 02/21/2016 11:19:45 [name], [name] ([id number]) -------------------------------------------------------------------------------- Patient/Caregiver Education Details [name] Date of Service: 02/21/2016 11:00 AM Patient Name: G.  Patient Account Number: [id number] Medical Record Treating RN: Montey Hora [id number] Number: Other Clinician: Date of Birth/Gender: 12-27-1932 (82 y.o. Female) Treating Dellia Nims, [name] Primary Care Physician/Extender: Marlou Starks Physician: Suella [place] in Treatment: 2 Referring Physician: [name] Education Assessment Education Provided To: Patient Education Topics Provided Wound/Skin Impairment: Handouts: Other: wound care as ordered Methods: Demonstration, Explain/Verbal Responses: State content correctly Electronic Signature(s) Signed: 02/21/2016 5:05:30 PM By: Montey Hora Entered By: Montey Hora on 02/21/2016 12:04:02 [name] ([id number]) -------------------------------------------------------------------------------- Wound Assessment Details [name] Date of Service: 02/21/2016 11:00 AM Patient Name: G. Patient Account Number: [id number] Medical Record Treating RN: Montey Hora [id number] Number: Other Clinician: Date of Birth/Sex: Nov 10, 1932 (82 y.o. Female) Treating [name], [name] Primary Care Physician/Extender: Marlou Starks Physician: Referring Physician: Jenelle Mages in Treatment: 2 Wound Status Wound Number: 1 Primary Abscess Etiology: Wound Location: Right Ilium Wound Open Wounding Event: Surgical Injury Status: Date Acquired: 11/29/2015 Comorbid Anemia, Hypertension, Osteomyelitis, Weeks Of Treatment: 2 History: Received Chemotherapy Clustered Wound: No Photos Wound Measurements Length: (cm) 0.3 Width: (cm) 0.7 Depth: (cm) 4.5 Area: (cm) 0.165 Volume: (cm) 0.742 % Reduction in Area: -1962.5% % Reduction in Volume: -2148.5% Epithelialization: None Tunneling: No Undermining: No Wound Description Full Thickness Without Exposed Foul Odor After Classification: Support Structures Wound Margin: Indistinct, nonvisible Exudate Large Amount: Exudate Type: Purulent Exudate Color:  yellow, brown,  green Cleansing: No Wound Bed Granulation Amount: Medium (34-66%) Exposed Structure [name], [name] ([id number]) Granulation Quality: Pink Fascia Exposed: No Necrotic Amount: Medium (34-66%) Fat Layer Exposed: No Necrotic Quality: Adherent Slough Tendon Exposed: No Muscle Exposed: No Joint Exposed: No Bone Exposed: No Limited to Skin Breakdown Periwound Skin Texture Texture Color No Abnormalities Noted: No No Abnormalities Noted: No Callus: No Atrophie Blanche: No Crepitus: No Cyanosis: No Excoriation: No Ecchymosis: No Fluctuance: No Erythema: No Friable: No Hemosiderin Staining: No Induration: Yes Mottled: No Localized Edema: Yes Pallor: No Rash: No Rubor: No Scarring: No Temperature / Pain Moisture Temperature: No Abnormality No Abnormalities Noted: No Dry / Scaly: No Maceration: No Moist: Yes Wound Preparation Ulcer Cleansing: Rinsed/Irrigated with Saline Topical Anesthetic Applied: None Treatment Notes Wound #1 (Right Ilium) 1. Cleansed with: Clean wound with Normal Saline 4. Dressing Applied: Aquacel Ag 5. Secondary Dressing Applied ABD Pad Dry Gauze 7. Secured with Microbiologist) Signed: 02/21/2016 5:05:30 PM By: Montey Hora Entered By: Montey Hora on 02/21/2016 13:36:05 [name], [name] ([id number]) [name], [name] ([id number]) -------------------------------------------------------------------------------- Vitals Details [name] Date of Service: 02/21/2016 11:00 AM Patient Name: G. Patient Account Number: [id number] Medical Record Treating RN: Montey Hora [id number] Number: Other Clinician: Date of Birth/Sex: [birth year] (82 y.o. Female) Treating [name], [name] Primary Care Physician/Extender: Marlou Starks Physician: Referring Physician: Jenelle Mages in Treatment: 2 Vital Signs Time Taken: 11:20 Temperature (F): 98.2 Height (in): 61 Pulse  (bpm): 62 Weight (lbs): 144 Respiratory Rate (breaths/min): 18 Body Mass Index (BMI): 27.2 Blood Pressure (mmHg): 125/85 Reference Range: 80 - 120 mg / dl Electronic Signature(s) Signed: 02/21/2016 5:05:30 PM By: Montey Hora Entered By: Montey Hora on 02/21/2016 11:21:05

## 2016-02-22 NOTE — Progress Notes (Signed)
PICC line placement confirmed by X-Ray . Pt. Discharged to care of son.

## 2016-02-22 NOTE — Progress Notes (Signed)
PICC line placed left arm 60fr. . Line flushed , chest x-ray to confirm placement .

## 2016-02-22 NOTE — Progress Notes (Signed)
[name] ([id number]) Visit Report for 02/21/2016 Chief Complaint Document Details [name] Date of Service: 02/21/2016 11:00 AM Patient Name: G. Patient Account Number: [id number] Medical Record Treating RN: Montey Hora [id number] Number: Other Clinician: Date of Birth/Sex: Jun 29, 1932 (82 y.o. Female) Treating Dellia Nims, [name] Primary Care Physician/Extender: Marlou Starks Physician: Referring Physician: Jenelle Mages in Treatment: 2 Information Obtained from: Patient Chief Complaint 02/07/16; patient is here for review of a draining surgical abscess site on the right buttock Electronic Signature(s) Signed: 02/22/2016 8:02:21 AM By: Linton Ham MD Entered By: Linton Ham on 02/21/2016 12:48:45 [name] ([id number]) -------------------------------------------------------------------------------- HPI Details [name] Date of Service: 02/21/2016 11:00 AM Patient Name: G. Patient Account Number: [id number] Medical Record Treating RN: Montey Hora [id number] Number: Other Clinician: Date of Birth/Sex: 20-Oct-1932 (82 y.o. Female) Treating [name], [name] Primary Care Physician/Extender: Marlou Starks Physician: Referring Physician: Jenelle Mages in Treatment: 2 History of Present Illness HPI Description: 02/07/16; this is a pleasant and functional 82 year old woman who is here for our review of a draining sinus from and abscess site on her right buttock. The history is that she noted a swelling in March that she could feel herself however there was no pain. Ultimately she was referred by her primary doctor to a general surgeon Dr. Jamal Collin. She underwent a drain placement on 4/20. The drain was removed on 5/11. I think she had an IandD on 5/26 with a second drain removed on 6/26. At that point the area had actually closed over however reopened on 7/4 with what the patient describes as a sudden  onset of pain and then she felt that the drainage. 2 cultures of this area did not culture anything either aerobically or anaerobically. There was a culture on 7/5 that showed a bacillus species. I don't think this was specifically targeted with antibiotics. He did receive a 10 day course of oral antibiotics at some point in this. She was also recently referred to a neurosurgeon in [place] Dr. Newman Pies. The issue here was that the patient has had multiple lower back surgeries and I think her general surgeon was concerned that this could be a source of infection for the abscess in the right buttock area. Her last CT scan was on 5/1. At that point she had a drain in place. She was noted to have a residual fluid collection measuring 2.2 x 1.5 in the right gluteal muscles. She was referred here by Dr. Ouida Sills who is her primary physician. I note that she is also referred her to infectious disease which seems reasonable. The patient states that she is not been systemically unwell no fever no chills. She continues to be active. Area is not painful and really never has been except for the recent reopening in early July. 02/14/16; the culture I did of this area last week was negative. The area measures 3.3 cm in depth we have been using silver alginate rope packing 02/21/16; as noted above the patient went to see Dr. Ola Spurr. A CT scan of her abdomen and pelvis was ordered. This showed a 1.6 x 4.6 x 4.5 fluid correction at the previous site of her drain. There is also an adjacent fluid collection on the other side of the iliac crest during 4.9 x 1 point 4 x 4 0.4 whether there was connection with spinal hardware was unclear based on the CT scan. Apparently an MRI has been ordered. When the patient was at Dr. Blane Ohara office today fluid just poured  out of this. His ofice put a plain dressing over the top by the time she got here the dressing was saturated. Electronic Signature(s) Signed:  02/22/2016 8:02:21 AM By: Linton Ham MD Entered By: Linton Ham on 02/21/2016 12:51:04 [name] ([id number]) -------------------------------------------------------------------------------- Physical Exam Details [name] Date of Service: 02/21/2016 11:00 AM Patient Name: G. Patient Account Number: [id number] Medical Record Treating RN: Montey Hora [id number] Number: Other Clinician: Date of Birth/Sex: Aug 04, 1932 (81 y.o. Female) Treating Dellia Nims, [name] Primary Care Physician/Extender: Marlou Starks Physician: Referring Physician: Jenelle Mages in Treatment: 2 Constitutional Sitting or standing Blood Pressure is within target range for patient.. Pulse regular and within target range for patient.[name] Respirations regular, non-labored and within target range.. Temperature is normal and within the target range for the patient.. Patient's appearance is neat and clean. Appears in no acute distress. Well nourished and well developed.. Notes Wound exam; small opening in the right buttock. This was 25 cm today versus 3.3 last time and 4 twoweeks ago. Clearly no progress is being made. The extent on the CT scan of the fluid collections noted out explains this. She is apparently going for an MRI to determine possible involvement of the hardware in her back. This would be an ominous finding. She is also apparently being ordered to IV antibiotics via PICC line by infectious disease. There is no overt evidence of a soft tissue infection Electronic Signature(s) Signed: 02/22/2016 8:02:21 AM By: Linton Ham MD Entered By: Linton Ham on 02/21/2016 12:56:24 [name] ([id number]) -------------------------------------------------------------------------------- Physician Orders Details [name] Date of Service: 02/21/2016 11:00 AM Patient Name: G. Patient Account Number: [id number] Medical Record Treating RN: Montey Hora [id number] Number: Other Clinician: Date of Birth/Sex: Oct 05, 1932 (82 y.o. Female) Treating Dellia Nims, [name] Primary Care Physician/Extender: Marlou Starks Physician: Referring Physician: Jenelle Mages in Treatment: 2 Verbal / Phone Orders: Yes Clinician: Montey Hora Read Back and Verified: Yes Diagnosis Coding Wound Cleansing Wound #1 Right Ilium o Clean wound with Normal Saline. Primary Wound Dressing Wound #1 Right Ilium o Other: - Aquacel Ag Rope Secondary Dressing Wound #1 Right Ilium o Boardered Foam Dressing Dressing Change Frequency Wound #1 Right Ilium o Change dressing every day. Follow-up Appointments Wound #1 Right Ilium o Return Appointment in 2 weeks. Electronic Signature(s) Signed: 02/21/2016 5:05:30 PM By: Montey Hora Signed: 02/22/2016 8:02:21 AM By: Linton Ham MD Entered By: Montey Hora on 02/21/2016 11:45:33 [name], [name] ([id number]) -------------------------------------------------------------------------------- Problem List Details [name] Date of Service: 02/21/2016 11:00 AM Patient Name: G. Patient Account Number: [id number] Medical Record Treating RN: Montey Hora [id number] Number: Other Clinician: Date of Birth/Sex: Apr 14, 1933 (82 y.o. Female) Treating Dellia Nims, [name] Primary Care Physician/Extender: Marlou Starks Physician: Referring Physician: Jenelle Mages in Treatment: 2 Active Problems ICD-10 Encounter Code Description Active Date Diagnosis L02.31 Cutaneous abscess of buttock 02/07/2016 Yes T81.31XD Disruption of external operation (surgical) wound, not 02/07/2016 Yes elsewhere classified, subsequent encounter Inactive Problems Resolved Problems Electronic Signature(s) Signed: 02/22/2016 8:02:21 AM By: Linton Ham MD Entered By: Linton Ham on 02/21/2016 12:47:37 [name], [name]  ([id number]) -------------------------------------------------------------------------------- Progress Note Details [name] Date of Service: 02/21/2016 11:00 AM Patient Name: G. Patient Account Number: [id number] Medical Record Treating RN: Montey Hora [id number] Number: Other Clinician: Date of Birth/Sex: 02-Apr-1933 (82 y.o. Female) Treating Dellia Nims, [name] Primary Care Physician/Extender: Marlou Starks Physician: Referring Physician: Jenelle Mages in Treatment: 2 Subjective Chief Complaint Information obtained from Patient 02/07/16; patient is here for review of a draining  surgical abscess site on the right buttock History of Present Illness (HPI) 02/07/16; this is a pleasant and functional 82 year old woman who is here for our review of a draining sinus from and abscess site on her right buttock. The history is that she noted a swelling in March that she could feel herself however there was no pain. Ultimately she was referred by her primary doctor to a general surgeon Dr. Jamal Collin. She underwent a drain placement on 4/20. The drain was removed on 5/11. I think she had an IandD on 5/26 with a second drain removed on 6/26. At that point the area had actually closed over however reopened on 7/4 with what the patient describes as a sudden onset of pain and then she felt that the drainage. 2 cultures of this area did not culture anything either aerobically or anaerobically. There was a culture on 7/5 that showed a bacillus species. I don't think this was specifically targeted with antibiotics. He did receive a 10 day course of oral antibiotics at some point in this. She was also recently referred to a neurosurgeon in [place] Dr. Newman Pies. The issue here was that the patient has had multiple lower back surgeries and I think her general surgeon was concerned that this could be a source of infection for the abscess in the right buttock area. Her last CT  scan was on 5/1. At that point she had a drain in place. She was noted to have a residual fluid collection measuring 2.2 x 1.5 in the right gluteal muscles. She was referred here by Dr. Ouida Sills who is her primary physician. I note that she is also referred her to infectious disease which seems reasonable. The patient states that she is not been systemically unwell no fever no chills. She continues to be active. Area is not painful and really never has been except for the recent reopening in early July. 02/14/16; the culture I did of this area last week was negative. The area measures 3.3 cm in depth we have been using silver alginate rope packing 02/21/16; as noted above the patient went to see Dr. Ola Spurr. A CT scan of her abdomen and pelvis was ordered. This showed a 1.6 x 4.6 x 4.5 fluid correction at the previous site of her drain. There is also an adjacent fluid collection on the other side of the iliac crest during 4.9 x 1 point 4 x 4 0.4 whether there was connection with spinal hardware was unclear based on the CT scan. Apparently an MRI has been ordered. When the patient was at Dr. Blane Ohara office today fluid just poured out of this. His ofice put a plain dressing over the top by the time she got here the dressing was saturated. [name], [name] Objective Constitutional Sitting or standing Blood Pressure is within target range for patient.. Pulse regular and within target range for patient.[name] Respirations regular, non-labored and within target range.. Temperature is normal and within the target range for the patient.. Patient's appearance is neat and clean. Appears in no acute distress. Well nourished and well developed.. Vitals Time Taken: 11:20 AM, Height: 61 in, Weight: 144 lbs, BMI: 27.2, Temperature: 98.2 F, Pulse: 62 bpm, Respiratory Rate: 18 breaths/min, Blood Pressure: 125/85 mmHg. General Notes: Wound exam; small opening in the right buttock. This was  25 cm today versus 3.3 last time and 4 twoweeks ago. Clearly no progress is being made. The extent on the CT scan of the fluid collections noted out explains this.  She is apparently going for an MRI to determine possible involvement of the hardware in her back. This would be an ominous finding. She is also apparently being ordered to IV antibiotics via PICC line by infectious disease. There is no overt evidence of a soft tissue infection Integumentary (Hair, Skin) Wound #1 status is Open. Original cause of wound was Surgical Injury. The wound is located on the Right Ilium. The wound measures 0.3cm length x 0.7cm width x 4.5cm depth; 0.165cm^2 area and 0.742cm^3 volume. The wound is limited to skin breakdown. There is no tunneling or undermining noted. There is a large amount of purulent drainage noted. The wound margin is indistinct and nonvisible. There is medium (34-66%) pink granulation within the wound bed. There is a medium (34-66%) amount of necrotic tissue within the wound bed including Adherent Slough. The periwound skin appearance exhibited: Induration, Localized Edema, Moist. The periwound skin appearance did not exhibit: Callus, Crepitus, Excoriation, Fluctuance, Friable, Rash, Scarring, Dry/Scaly, Maceration, Atrophie Blanche, Cyanosis, Ecchymosis, Hemosiderin Staining, Mottled, Pallor, Rubor, Erythema. Periwound temperature was noted as No Abnormality. Assessment Active Problems ICD-10 L02.31 - Cutaneous abscess of buttock T81.31XD - Disruption of external operation (surgical) wound, not elsewhere classified, subsequent encounter [name], [name] ([id number]) Plan Wound Cleansing: Wound #1 Right Ilium: Clean wound with Normal Saline. Primary Wound Dressing: Wound #1 Right Ilium: Other: - Aquacel Ag Rope Secondary Dressing: Wound #1 Right Ilium: Boardered Foam Dressing Dressing Change Frequency: Wound #1 Right Ilium: Change dressing every day. Follow-up  Appointments: Wound #1 Right Ilium: Return Appointment in 2 weeks. #1 given the drainage that was saturating the dressing that was put on and an infectious disease office today I went ahead and use the silver alginate to absorb some of this and really for no other reason. #2 I gave her an appointment for 2 weeks from now I'm not sure how we can participate in this at this time, very clearly she is going to need further investigations possible surgical IandD is to be repeated Electronic Signature(s) Signed: 02/22/2016 8:02:21 AM By: Linton Ham MD Entered By: Linton Ham on 02/21/2016 12:57:26 [name], [name] ([id number]) -------------------------------------------------------------------------------- SuperBill Details [name] Date of Service: 02/21/2016 Patient Name: G. Patient Account Number: [id number] Medical Record Treating RN: Montey Hora [id number] Number: Other Clinician: Date of Birth/Sex: Jun 28, 1932 (82 y.o. Female) Treating Dellia Nims, [name] Primary Care Physician/Extender: Marlou Starks Physician: Suella Grove in Treatment: 2 Referring Physician: [name] Diagnosis Coding ICD-10 Codes Code Description L02.31 Cutaneous abscess of buttock Disruption of external operation (surgical) wound, not elsewhere classified, subsequent T81.31XD encounter Facility Procedures CPT4 Code: [id number] Description: [id number] - WOUND CARE VISIT-LEV 2 EST PT Modifier: Quantity: 1 Physician Procedures CPT4 Code: [id number] Description: [id number] - WC PHYS LEVEL 2 - EST PT ICD-10 Description Diagnosis L02.31 Cutaneous abscess of buttock Modifier: Quantity: 1 Electronic Signature(s) Signed: 02/22/2016 8:02:21 AM By: Linton Ham MD Previous Signature: 02/21/2016 12:03:11 PM Version By: Montey Hora Entered By: Linton Ham on 02/21/2016 12:57:51

## 2016-02-23 ENCOUNTER — Telehealth: Payer: Self-pay

## 2016-02-23 NOTE — Telephone Encounter (Signed)
Pt called stating Dr. Verda Cumins placed a PICC line yesterday due to abscess infection on her back. Pt is very concerned about getting BCG tx while on IV abx. Pt wanted to know if she should still come tomorrow. Please advise.

## 2016-02-23 NOTE — Telephone Encounter (Signed)
Yes, its safe.    Hollice Espy, MD

## 2016-02-23 NOTE — Telephone Encounter (Signed)
Spoke with pt in reference to coming for BCG tx tomorrow. Pt voiced understanding.

## 2016-02-24 ENCOUNTER — Ambulatory Visit: Payer: Medicare Other | Admitting: Urology

## 2016-02-24 ENCOUNTER — Emergency Department: Payer: Medicare Other

## 2016-02-24 ENCOUNTER — Emergency Department
Admission: EM | Admit: 2016-02-24 | Discharge: 2016-02-25 | Disposition: A | Discharge status: Home / Self Care | Payer: Medicare Other | Attending: Emergency Medicine | Admitting: Emergency Medicine

## 2016-02-24 VITALS — BP 148/63 | HR 57 | Ht 61.0 in | Wt 143.0 lb

## 2016-02-24 DIAGNOSIS — C679 Malignant neoplasm of bladder, unspecified: Secondary | ICD-10-CM | POA: Diagnosis not present

## 2016-02-24 DIAGNOSIS — Z79899 Other long term (current) drug therapy: Secondary | ICD-10-CM | POA: Insufficient documentation

## 2016-02-24 DIAGNOSIS — I10 Essential (primary) hypertension: Secondary | ICD-10-CM | POA: Insufficient documentation

## 2016-02-24 DIAGNOSIS — L0231 Cutaneous abscess of buttock: Secondary | ICD-10-CM

## 2016-02-24 DIAGNOSIS — Z452 Encounter for adjustment and management of vascular access device: Secondary | ICD-10-CM | POA: Diagnosis present

## 2016-02-24 DIAGNOSIS — Z95828 Presence of other vascular implants and grafts: Secondary | ICD-10-CM

## 2016-02-24 DIAGNOSIS — Z8551 Personal history of malignant neoplasm of bladder: Secondary | ICD-10-CM | POA: Diagnosis not present

## 2016-02-24 LAB — MICROSCOPIC EXAMINATION: Bacteria, UA: NONE SEEN

## 2016-02-24 LAB — URINALYSIS, COMPLETE
BILIRUBIN UA: NEGATIVE
Glucose, UA: NEGATIVE
Ketones, UA: NEGATIVE
LEUKOCYTES UA: NEGATIVE
NITRITE UA: NEGATIVE
PH UA: 7 (ref 5.0–7.5)
Protein, UA: NEGATIVE
Specific Gravity, UA: 1.01 (ref 1.005–1.030)
Urobilinogen, Ur: 0.2 mg/dL (ref 0.2–1.0)

## 2016-02-24 MED ORDER — LIDOCAINE HCL 2 % EX GEL
1.0000 "application " | Freq: Once | CUTANEOUS | Status: AC
  Administered 2016-02-24: 1 via URETHRAL

## 2016-02-24 MED ORDER — CIPROFLOXACIN HCL 500 MG PO TABS
500.0000 mg | ORAL_TABLET | Freq: Once | ORAL | Status: AC
  Administered 2016-02-24: 500 mg via ORAL

## 2016-02-24 NOTE — ED Notes (Signed)
Pt stating that PICC has been leaking since Wednesday after pt was D/C home from the hospital. Pt denying any pain at site. Pt stating that there was some yellow drainage today. Home health nurse told pt to come to the ED because of drainage and because the PICC has 9cm of line showing and was concerned that line was displaced.

## 2016-02-24 NOTE — Progress Notes (Signed)
5:41 PM  02/24/16   [name] 03-31-1933 [id number]  Referring provider: Kirk Ruths, MD [place], [place] [address]  Chief Complaint  Patient presents with  . Cysto    HPI: 82 year old nonsmoker with  incidental 11 mm papillary neoplasm near the RIGHT bladder neck. She was taken to the operating room on 09/29/2014 for TURBT, Bilateral retrograde pyelogramat which time a 2 cm extremely spherical mass was identified just proximal to the RIGHT UO. This was resected along with deeper biopsies of the base. Of note, bilateral retrograde pyelogram was negative for any obvious upper tract filling defects or hydronephrosis.   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 The tumor had an inverted architecture. Deeper biopsies did contain muscle after speaking with the pathologist today and the muscle was negative for any tumor. Additonally, CIS was noted.  She returned to the operating room on 11/02/2014 for restaging TURBT. Pathology showed no evidence of residual tumor.  She completed BCG x 6 for induction and first course of maintenence bcg x 3 doses  X 2.  Most recent maintainance dose completed in 07/2015.     Since her last visit, she had worsening issues with her gluteal abscess which has progressed tracking to her psoaas and quadratus muscle and questionable involvement of her spinal hardware. She currently has a PICC line in place and is receiving IV vancomycin.  She is undergone 2 surgical drainage's at this time. AFB cultures negative.    She returns today for surveillance cystoscopy. She has no urinary complaints today.     PMH: Past Medical History:  Diagnosis Date  . Anemia   . Cancer [facility]) 2016   bladder tumor  . Carotid arterial disease ([place]) 01/09/2014   Overview:  Minimal on screening   . Chronic low back pain   . Colon polyp   . DDD (degenerative disc  disease), cervical   . DDD (degenerative disc disease), lumbar   . Elevated lipids   . Foot pain   . Glaucoma   . Hypertension   . Multiple gastric ulcers   . Osteoporosis     Surgical History: Past Surgical History:  Procedure Laterality Date  . ABDOMINAL HYSTERECTOMY  1973  . BACK SURGERY     03/1975-ruptured disk, 11/1983-ruptured disk, 11/1984-ruptured disk, 10/1996-ruptured disk, 08/1997-spinal fusion, 02/10/07-spinal fusion [facility]  . CARPAL TUNNEL RELEASE Right   . CATARACT EXTRACTION Bilateral   . CYSTOSCOPY W/ RETROGRADES Bilateral 09/29/2014   Procedure: CYSTOSCOPY WITH RETROGRADE PYELOGRAM;  Surgeon: Hollice Espy, MD;  Location: [facility] ORS;  Service: Urology;  Laterality: Bilateral;  . CYSTOSCOPY WITH BIOPSY N/A 11/02/2014   Procedure: CYSTOSCOPY WITH BIOPSY/WITH MITOMYCIN;  Surgeon: Hollice Espy, MD;  Location: [facility] ORS;  Service: Urology;  Laterality: N/A;  . ESOPHAGOGASTRODUODENOSCOPY (EGD) WITH PROPOFOL N/A 05/10/2015   Procedure: ESOPHAGOGASTRODUODENOSCOPY (EGD) WITH PROPOFOL;  Surgeon: Lollie Sails, MD;  Location: [facility] ENDOSCOPY;  Service: Endoscopy;  Laterality: N/A;  . EYE SURGERY    . FOOT SURGERY Right   . IRRIGATION AND DEBRIDEMENT BUTTOCKS Right 10/21/2015   Procedure: DRAINAGE GLUTEAL ABSCESS;  Surgeon: Christene Lye, MD;  Location: [facility] ORS;  Service: General;  Laterality: Right;  . KNEE ARTHROSCOPY Right    x2  . MANDIBLE SURGERY Bilateral    x2  . SHOULDER ARTHROSCOPY WITH ROTATOR CUFF REPAIR AND SUBACROMIAL DECOMPRESSION Right 05/25/2015   Procedure: SHOULDER ARTHROSCOPY WITH ROTATOR CUFF REPAIR AND  SUBACROMIAL DECOMPRESSION, release long head biceps tendon;  Surgeon: Leanor Kail, MD;  Location: [facility] ORS;  Service: Orthopedics;  Laterality: Right;  . TRANSURETHRAL RESECTION OF BLADDER TUMOR N/A 09/29/2014   Procedure: TRANSURETHRAL RESECTION OF BLADDER TUMOR (TURBT);  Surgeon: Hollice Espy, MD;  Location: [facility] ORS;  Service: Urology;   Laterality: N/A;  . WRIST FRACTURE SURGERY Left     Home Medications:    Medication List       Accurate as of 02/24/16  5:41 PM. Always use your most recent med list.          acetaminophen 650 MG CR tablet Commonly known as:  TYLENOL Take 650 mg by mouth every 8 (eight) hours as needed for pain. Reported on 11/18/2015   calcium-vitamin D 500-200 MG-UNIT tablet Commonly known as:  OSCAL WITH D Take 2 tablets by mouth 2 (two) times daily.   carvedilol 25 MG tablet Commonly known as:  COREG Take 25 mg by mouth 2 (two) times daily with a meal.   diltiazem 240 MG 24 hr capsule Commonly known as:  CARDIZEM CD Take 240 mg by mouth daily.   latanoprost 0.005 % ophthalmic solution Commonly known as:  XALATAN Place 1 drop into both eyes at bedtime.   pantoprazole 40 MG tablet Commonly known as:  PROTONIX   piperacillin-tazobactam 4.5 (4-0.5) g injection Commonly known as:  ZOSYN   pravastatin 40 MG tablet Commonly known as:  PRAVACHOL Take 40 mg by mouth daily at 6 PM.   raloxifene 60 MG tablet Commonly known as:  EVISTA Take 60 mg by mouth daily.   vancomycin 5000 MG injection Commonly known as:  VANCOCIN   vitamin C 1000 MG tablet Take 1,000 mg by mouth daily.       Allergies:  No Known Allergies  Family History: Family History  Problem Relation Age of Onset  . Heart attack Father   . Emphysema Father   . Heart disease Mother   . Hypertension Mother   . Diabetes type II Sister   . Kidney disease Neg Hx   . Bladder Cancer Neg Hx   . Breast cancer Neg Hx     Social History:  reports that she has never smoked. She has never used smokeless tobacco. She reports that she does not drink alcohol or use drugs.   Physical Exam: BP (!) 148/63   Pulse (!) 57   Ht 5\' 1"  (1.549 m)   Wt 143 lb (64.9 kg)   BMI 27.02 kg/m   Constitutional:  Alert and oriented, No acute distress. HEENT: [place] AT, moist mucus membranes.  Trachea midline, no  masses. Cardiovascular: No clubbing, cyanosis, or edema. Respiratory: Normal respiratory effort, no increased work of breathing. GI: Abdomen is soft, nontender, nondistended, no abdominal masses  GU:  Normal external genitalia. Normal urethral meatus. Skin: No rashes, bruises or suspicious lesions. Neurologic: Grossly intact, no focal deficits, moving all 4 extremities. Psychiatric: Normal mood and affect.  Laboratory Data: Lab Results  Component Value Date   WBC 8.2 12/14/2015   HGB 9.0 (L) 12/14/2015   HCT 28.0 (L) 12/14/2015   MCV 81.0 12/14/2015   PLT 392 12/14/2015    Urinalysis UA reviewed, no evidence of infection  Pertinent Imaging: Recent CT scan and MRI results from 01/3016 reviewed Progression of gluteal/ psoas abscess   Cystoscopy Procedure Note  Patient identification was confirmed, informed consent was obtained, and patient was prepped using Betadine solution.  Lidocaine jelly was administered per urethral meatus.  Preoperative abx where received prior to procedure.    Procedure: - Flexible cystoscope introduced, without any difficulty.   - Thorough search of the bladder revealed:    normal urethral meatus    normal urothelium    no stones    no ulcers     no tumors    no urethral polyps    no trabeculation  - Ureteral orifices were normal in position and appearance.  Post-Procedure: - Patient tolerated the procedure well  Assessment & Plan:  82 year old female with a history of high-grade T1, Tis of the right bladder neck dx 09/2014 followed by induction BCG x 6, and most recently  maintenance BCG completed 04/2015 and 07/2015.  She returns today for surveillance cystoscopy.   1. Malignant neoplasm of bladder neck Cystoscopy today negative.  Recommend q3 month surveillance cystoscopy. Cytology sent today. Due for BCG maintenance due in 01/2016 following this cystoscopy - discussed with Dr. Ola Spurr from ID re: whether to procede in setting of  worsening abcess, etc.   AFB culture negative.   In setting of worsening infection of unclear etiology, will plan to defer next maintenance dose x 3 at this time.  Will reassess clinical status in 1 month to see if improving and may continue BCG schedule as previously planned if infection clearing.  2. Gluteal abscess As above  - Urinalysis, Complete   Return in about 3 months (around 05/25/2016) for cysto.   And in 1 month to reassess patient for possible BCG maintainance course  Hollice Espy, MD  [place] [address], [place], [place] [address] [phone]

## 2016-02-24 NOTE — ED Triage Notes (Signed)
Patient is concerned regarding picc line.  States home nurse told her she had never seen one that long and states that now it is leaking.

## 2016-02-25 ENCOUNTER — Emergency Department: Payer: Medicare Other

## 2016-02-25 LAB — VANCOMYCIN, TROUGH: Vancomycin Tr: 12 ug/mL — ABNORMAL LOW (ref 15–20)

## 2016-02-25 MED ORDER — PRAVASTATIN SODIUM 40 MG PO TABS: 40.0000 mg | ORAL_TABLET | Freq: Every day | ORAL | Status: DC

## 2016-02-25 MED ORDER — VANCOMYCIN HCL IN DEXTROSE 1-5 GM/200ML-% IV SOLN
1000.0000 mg | Freq: Every day | INTRAVENOUS | Status: DC
  Administered 2016-02-25: 1000 mg via INTRAVENOUS
  Filled 2016-02-25: qty 200

## 2016-02-25 MED ORDER — CALCIUM CARBONATE-VITAMIN D 500-200 MG-UNIT PO TABS
2.0000 | ORAL_TABLET | Freq: Two times a day (BID) | ORAL | Status: DC
  Administered 2016-02-25: 2 via ORAL
  Filled 2016-02-25: qty 2

## 2016-02-25 MED ORDER — VITAMIN C 500 MG PO TABS
1000.0000 mg | ORAL_TABLET | Freq: Every day | ORAL | Status: DC
Start: 2016-02-25 — End: 2016-02-25
  Administered 2016-02-25: 1000 mg via ORAL
  Filled 2016-02-25: qty 2

## 2016-02-25 MED ORDER — PIPERACILLIN-TAZOBACTAM 3.375 G IVPB
3.3750 g | Freq: Three times a day (TID) | INTRAVENOUS | Status: DC
  Administered 2016-02-25: 3.375 g via INTRAVENOUS
  Filled 2016-02-25: qty 50

## 2016-02-25 MED ORDER — VANCOMYCIN HCL 1000 MG IV SOLR: 1.0000 g | Freq: Every day | INTRAVENOUS | Status: DC

## 2016-02-25 MED ORDER — CARVEDILOL 25 MG PO TABS
25.0000 mg | ORAL_TABLET | Freq: Two times a day (BID) | ORAL | Status: DC
  Administered 2016-02-25: 25 mg via ORAL
  Filled 2016-02-25: qty 1

## 2016-02-25 MED ORDER — PIPERACILLIN SOD-TAZOBACTAM SO 3.375 (3-0.375) G IV SOLR: 3.3750 g | Freq: Three times a day (TID) | INTRAVENOUS | Status: DC

## 2016-02-25 MED ORDER — ACETAMINOPHEN 325 MG PO TABS: 650.0000 mg | ORAL_TABLET | Freq: Three times a day (TID) | ORAL | Status: DC | PRN

## 2016-02-25 MED ORDER — RALOXIFENE HCL 60 MG PO TABS
60.0000 mg | ORAL_TABLET | Freq: Every day | ORAL | Status: DC
  Administered 2016-02-25: 60 mg via ORAL
  Filled 2016-02-25: qty 1

## 2016-02-25 MED ORDER — DILTIAZEM HCL ER COATED BEADS 240 MG PO CP24
240.0000 mg | ORAL_CAPSULE | Freq: Every day | ORAL | Status: DC
  Administered 2016-02-25: 240 mg via ORAL
  Filled 2016-02-25: qty 1

## 2016-02-25 NOTE — ED Notes (Addendum)
Vascular called for pt's PICC line for possible replacement. Nurse spoke to [facility] and was told that they would be able to come in at 11am. Dr. Karma Greaser notified and aware of time given to nurse. Sharyn Lull, RN was notified and she is going to speak to house supervisor about the wait. Pt and pt's son notified of the possible wait for vascular. Pt's son is going home to rest. Pt was repositioned in bed. Pt's current PICC line is still covered at this time. Pt denying any further needs. Lights were turned down for pt so she is able to rest. Pt told she would be updated if earlier time was able to be established. Dr. Karma Greaser to reevaluate.

## 2016-02-25 NOTE — ED Notes (Signed)
Selinda Eon, RN called house supervisor to get pt a bed for the night. Pt's medication list received by pt's son. Pt up to restroom. Pt has no complaints or needs at this time.

## 2016-02-25 NOTE — Progress Notes (Signed)
1050 PICC present in left arm is out 25cm and dressing is not intact. The PICC was removed entire 46 cm was DC'd with no difficulty and sterile dressing placed.

## 2016-02-25 NOTE — ED Provider Notes (Signed)
[facility] Emergency Department Provider Note  ____________________________________________   First MD Initiated Contact with Patient 02/25/16 0033     (approximate)  I have reviewed the triage vital signs and the nursing notes.   HISTORY  Chief Complaint PICC line problems    HPI [name] is a 82 y.o. female recently discharged from this hospital after having a PICC line placed for 2 antibiotics by home health who presents with concerns about the PICC line.  It apparently  She was to be working its way out of her arm since coming home from the hospital couple days ago.  Home health was very concerned about its appearance and told her that she should come to the emergency department.  8-9 cm of the line are visible and coiled up on the outside of her left upper extremity.  She denies any fevers, chills, and acute pain of any kind as per the review of systems listed below.  Past Medical History:  Diagnosis Date  . Anemia   . Cancer [facility]) 2016   bladder tumor  . Carotid arterial disease ([place]) 01/09/2014   Overview:  Minimal on screening   . Chronic low back pain   . Colon polyp   . DDD (degenerative disc disease), cervical   . DDD (degenerative disc disease), lumbar   . Elevated lipids   . Foot pain   . Glaucoma   . Hypertension   . Multiple gastric ulcers   . Osteoporosis     Patient Active Problem List   Diagnosis Date Noted  . Abscess of gluteal region 02/21/2016  . Psoas abscess ([place]) 02/21/2016  . Malignant neoplasm of urinary bladder ([place]) 08/23/2015  . History of repair of rotator cuff 08/23/2015  . Encounter for general adult medical examination without abnormal findings 08/04/2015  . Impingement syndrome of shoulder 03/30/2015  . Impingement syndrome of right shoulder 03/30/2015  . Anemia, iron deficiency 02/02/2015  . IDA (iron deficiency anemia) 01/26/2015  . Malignant neoplasm of bladder neck ([place]) 12/30/2014  .  Colon polyp 12/17/2014  . Inflammation of sacroiliac joint ([place]) 09/22/2014  . Carotid arterial disease ([place]) 01/09/2014  . Combined fat and carbohydrate induced hyperlipemia 01/09/2014  . Accumulation of fluid in tissues 01/09/2014  . BP (high blood pressure) 01/09/2014  . Arthritis, degenerative 01/09/2014  . OP (osteoporosis) 01/09/2014  . Carotid artery disease ([place]) 01/09/2014  . Edema 01/09/2014  . Cervical osteoarthritis 10/28/2013  . DDD (degenerative disc disease), cervical 10/28/2013  . Acute blood loss anemia 05/16/2012  . Fracture of parietal bone ([place]) 05/16/2012  . Fall down stairs 05/16/2012  . Hemorrhage into subarachnoid space of neuraxis ([place]) 05/16/2012  . Subdural hematoma ([place]) 05/16/2012  . Fracture of temporal bone ([place]) 05/16/2012    Past Surgical History:  Procedure Laterality Date  . ABDOMINAL HYSTERECTOMY  1973  . BACK SURGERY     03/1975-ruptured disk, 11/1983-ruptured disk, 11/1984-ruptured disk, 10/1996-ruptured disk, 08/1997-spinal fusion, 02/10/07-spinal fusion [facility]  . CARPAL TUNNEL RELEASE Right   . CATARACT EXTRACTION Bilateral   . CYSTOSCOPY W/ RETROGRADES Bilateral 09/29/2014   Procedure: CYSTOSCOPY WITH RETROGRADE PYELOGRAM;  Surgeon: Hollice Espy, MD;  Location: [facility] ORS;  Service: Urology;  Laterality: Bilateral;  . CYSTOSCOPY WITH BIOPSY N/A 11/02/2014   Procedure: CYSTOSCOPY WITH BIOPSY/WITH MITOMYCIN;  Surgeon: Hollice Espy, MD;  Location: [facility] ORS;  Service: Urology;  Laterality: N/A;  . ESOPHAGOGASTRODUODENOSCOPY (EGD) WITH PROPOFOL N/A 05/10/2015   Procedure: ESOPHAGOGASTRODUODENOSCOPY (EGD) WITH PROPOFOL;  Surgeon: Hassell Done  Kassie Mends, MD;  Location: [facility] ENDOSCOPY;  Service: Endoscopy;  Laterality: N/A;  . EYE SURGERY    . FOOT SURGERY Right   . IRRIGATION AND DEBRIDEMENT BUTTOCKS Right 10/21/2015   Procedure: DRAINAGE GLUTEAL ABSCESS;  Surgeon: Christene Lye, MD;  Location: [facility] ORS;  Service: General;  Laterality: Right;  .  KNEE ARTHROSCOPY Right    x2  . MANDIBLE SURGERY Bilateral    x2  . SHOULDER ARTHROSCOPY WITH ROTATOR CUFF REPAIR AND SUBACROMIAL DECOMPRESSION Right 05/25/2015   Procedure: SHOULDER ARTHROSCOPY WITH ROTATOR CUFF REPAIR AND SUBACROMIAL DECOMPRESSION, release long head biceps tendon;  Surgeon: Leanor Kail, MD;  Location: [facility] ORS;  Service: Orthopedics;  Laterality: Right;  . TRANSURETHRAL RESECTION OF BLADDER TUMOR N/A 09/29/2014   Procedure: TRANSURETHRAL RESECTION OF BLADDER TUMOR (TURBT);  Surgeon: Hollice Espy, MD;  Location: [facility] ORS;  Service: Urology;  Laterality: N/A;  . WRIST FRACTURE SURGERY Left     Prior to Admission medications   Medication Sig Start Date End Date Taking? Authorizing Provider  acetaminophen (TYLENOL) 650 MG CR tablet Take 650 mg by mouth every 8 (eight) hours as needed for pain. Reported on 11/18/2015   Yes Historical Provider, MD  Ascorbic Acid (VITAMIN C) 1000 MG tablet Take 1,000 mg by mouth daily.   Yes Historical Provider, MD  calcium-vitamin D (OSCAL WITH D) 500-200 MG-UNIT per tablet Take 2 tablets by mouth 2 (two) times daily.    Yes Historical Provider, MD  carvedilol (COREG) 25 MG tablet Take 25 mg by mouth 2 (two) times daily with a meal.   Yes Historical Provider, MD  diltiazem (CARDIZEM CD) 240 MG 24 hr capsule Take 240 mg by mouth daily.   Yes Historical Provider, MD  latanoprost (XALATAN) 0.005 % ophthalmic solution Place 1 drop into both eyes at bedtime.   Yes Historical Provider, MD  PIPERACILLIN SOD-TAZOBACTAM SO IV Inject 3.375 g into the vein 3 (three) times daily.   Yes Historical Provider, MD  pravastatin (PRAVACHOL) 40 MG tablet Take 40 mg by mouth daily at 6 PM.   Yes Historical Provider, MD  raloxifene (EVISTA) 60 MG tablet Take 60 mg by mouth daily.   Yes Historical Provider, MD  VANCOMYCIN HCL IV Inject 1 g into the vein daily.   Yes Historical Provider, MD    Allergies Review of patient's allergies indicates no known  allergies.  Family History  Problem Relation Age of Onset  . Heart attack Father   . Emphysema Father   . Heart disease Mother   . Hypertension Mother   . Diabetes type II Sister   . Kidney disease Neg Hx   . Bladder Cancer Neg Hx   . Breast cancer Neg Hx     Social History Social History  Substance Use Topics  . Smoking status: Never Smoker  . Smokeless tobacco: Never Used  . Alcohol use No    Review of Systems Constitutional: No fever/chills Eyes: No visual changes. ENT: No sore throat. Cardiovascular: Denies chest pain. Respiratory: Denies shortness of breath. Gastrointestinal: No abdominal pain.  No nausea, no vomiting.  No diarrhea.  No constipation. Genitourinary: Negative for dysuria. Musculoskeletal: Negative for back pain. Skin: Negative for rash. Neurological: Negative for headaches, focal weakness or numbness.  10-point ROS otherwise negative.  ____________________________________________   PHYSICAL EXAM:  VITAL SIGNS: ED Triage Vitals [02/24/16 2022]  Enc Vitals Group     BP (!) 166/97     Pulse Rate 71     Resp 20  Temp 98.1 F (36.7 C)     Temp Source Oral     SpO2 100 %     Weight      Height      Head Circumference      Peak Flow      Pain Score      Pain Loc      Pain Edu?      Excl. in [place]?     Constitutional: Alert and oriented. Well appearing and in no acute distress. Eyes: Conjunctivae are normal. PERRL. EOMI. Head: Atraumatic. Nose: No congestion/rhinnorhea. Mouth/Throat: Mucous membranes are moist.  Oropharynx non-erythematous. Neck: No stridor.  No meningeal signs.   Cardiovascular: Normal rate, regular rhythm. Good peripheral circulation. Grossly normal heart sounds. Respiratory: Normal respiratory effort.  No retractions. Lungs CTAB. Gastrointestinal: Soft and nontender. No distention.  Musculoskeletal: No lower extremity tenderness nor edema. No gross deformities of extremities. Neurologic:  Normal speech and  language. No gross focal neurologic deficits are appreciated.  Skin:  Skin is warm, dry and intact. No rash noted. Her PICC line site in the left upper extremity is well-appearing with no evidence of abscess, erythema, cellulitis, hematoma, etc.  However there is an extensive amount of the tubing that is present outside of the extremity. Psychiatric: Mood and affect are normal. Speech and behavior are normal.  ____________________________________________   LABS (all labs ordered are listed, but only abnormal results are displayed)  Labs Reviewed  VANCOMYCIN, TROUGH - Abnormal; Notable for the following:       Result Value   Vancomycin Tr 12 (*)    All other components within normal limits   ____________________________________________  EKG  None - EKG not ordered by ED physician ____________________________________________  RADIOLOGY   Dg Chest 1 View  Result Date: 02/24/2016 CLINICAL DATA:  PICC placement.  Initial encounter. EXAM: CHEST 1 VIEW COMPARISON:  Chest radiograph performed 02/22/2016 FINDINGS: A left PICC is noted ending about the left subclavian vein, just medial to the level of the left axillary vein. The lungs are well-aerated. Mild peribronchial thickening is noted. There is no evidence of focal opacification, pleural effusion or pneumothorax. The cardiomediastinal silhouette is borderline normal in size. No acute osseous abnormalities are seen. IMPRESSION: 1. Left PICC noted ending about the left subclavian vein, just medial to the level of the left axillary vein. 2. Mild peribronchial thickening noted.  Lungs otherwise clear. Electronically Signed   By: Garald Balding M.D.   On: 02/24/2016 21:27    ____________________________________________   PROCEDURES  Procedure(s) performed:   Procedures   Critical Care performed: No ____________________________________________   INITIAL IMPRESSION / ASSESSMENT AND PLAN / ED COURSE  Pertinent labs & imaging results  that were available during my care of the patient were reviewed by me and considered in my medical decision making (see chart for details).  The PICC line has moved and seems to be coming out compared to the prior imaging where it was in the vena cava.  We will contact the PICC team to see if they can replace it with a new one.  The patient is in no acute distress and needs no lab work at this time as there is no evidence of any acute infection or injury.  Clinical Course  Comment By Time  The PICC team is unavailable until 11am.  This was verified with the PICC team supervisor by our charge nurse.  There is no justification to bring the patient into the hospital for an observation  stay since all she needs is a new PICC line.  I will get her hospital bed and we are attempting to verify her current medication regimen (no discharge summary is currently available yet from the hospitalist). Hinda Kehr, MD 09/30 0207  The patient's medications were ordered overnight and we are still awaiting the PICC team.  Care is transferred to Dr. Quentin Cornwall this morning to follow-up on the placement of the PICC line and to discharge on appropriate. Hinda Kehr, MD 09/30 [phone]       ____________________________________________  FINAL CLINICAL IMPRESSION(S) / ED DIAGNOSES  Final diagnoses:  S/P PICC central line placement     MEDICATIONS GIVEN DURING THIS VISIT:  Medications  acetaminophen (TYLENOL) tablet 650 mg (not administered)  vitamin C (ASCORBIC ACID) tablet 1,000 mg (not administered)  calcium-vitamin D (OSCAL WITH D) 500-200 MG-UNIT per tablet 2 tablet (2 tablets Oral Not Given 02/25/16 0330)  carvedilol (COREG) tablet 25 mg (not administered)  diltiazem (CARDIZEM CD) 24 hr capsule 240 mg (not administered)  pravastatin (PRAVACHOL) tablet 40 mg (not administered)  raloxifene (EVISTA) tablet 60 mg (not administered)  piperacillin-tazobactam (ZOSYN) IVPB 3.375 g (3.375 g Intravenous New Bag/Given  02/25/16 0409)  vancomycin (VANCOCIN) IVPB 1000 mg/200 mL premix (0 mg Intravenous Stopped 02/25/16 0557)     NEW OUTPATIENT MEDICATIONS STARTED DURING THIS VISIT:  New Prescriptions   No medications on file    Modified Medications   No medications on file    Discontinued Medications   PANTOPRAZOLE (PROTONIX) 40 MG TABLET       PIPERACILLIN-TAZOBACTAM (ZOSYN) 4.5 (4-0.5) G INJECTION       VANCOMYCIN (VANCOCIN) 5000 MG INJECTION         Note:  This document was prepared using Dragon voice recognition software and may include unintentional dictation errors.    Hinda Kehr, MD 02/25/16 [phone]

## 2016-03-01 ENCOUNTER — Other Ambulatory Visit: Payer: Self-pay | Admitting: Urology

## 2016-03-01 ENCOUNTER — Ambulatory Visit: Payer: Medicare Other | Admitting: Urology

## 2016-03-02 ENCOUNTER — Ambulatory Visit: Payer: Medicare Other | Admitting: Urology

## 2016-03-06 ENCOUNTER — Ambulatory Visit
Admission: RE | Admit: 2016-03-06 | Discharge: 2016-03-06 | Disposition: A | Discharge status: Home / Self Care | Payer: Medicare Other | Source: Ambulatory Visit | Attending: Internal Medicine | Admitting: Internal Medicine

## 2016-03-06 DIAGNOSIS — Z1231 Encounter for screening mammogram for malignant neoplasm of breast: Secondary | ICD-10-CM | POA: Diagnosis not present

## 2016-03-07 ENCOUNTER — Encounter: Payer: Medicare Other | Attending: Physician Assistant | Admitting: Nurse Practitioner

## 2016-03-07 DIAGNOSIS — Y839 Surgical procedure, unspecified as the cause of abnormal reaction of the patient, or of later complication, without mention of misadventure at the time of the procedure: Secondary | ICD-10-CM | POA: Insufficient documentation

## 2016-03-07 DIAGNOSIS — T8131XD Disruption of external operation (surgical) wound, not elsewhere classified, subsequent encounter: Secondary | ICD-10-CM | POA: Insufficient documentation

## 2016-03-07 DIAGNOSIS — L0231 Cutaneous abscess of buttock: Secondary | ICD-10-CM | POA: Insufficient documentation

## 2016-03-08 ENCOUNTER — Ambulatory Visit: Payer: Medicare Other | Admitting: Urology

## 2016-03-08 NOTE — Progress Notes (Signed)
[name], [name] Visit Report for 03/07/2016 Arrival Information Details [name], [name] 03/07/2016 10:00 Patient Name: Date of Service: G. AM Medical Record Patient Account Number: [id number] Number: Treating RN: Cornell Barman Date of Birth/Sex: [birth year] (82 y.o. Female) Other Clinician: Primary Care Physician: [name] Treating STONE III, HOYT Referring Physician: [name] Physician/Extender: Suella Grove in Treatment: 4 Visit Information History Since Last Visit Added or deleted any medications: No Patient Arrived: Ambulatory Any new allergies or adverse reactions: No Arrival Time: 10:06 Had a fall or experienced change in No Accompanied By: self activities of daily living that may affect Transfer Assistance: None risk of falls: Patient Identification Verified: Yes Signs or symptoms of abuse/neglect since last No Secondary Verification Process Yes visito Completed: Hospitalized since last visit: No Patient Has Alerts: Yes Has Dressing in Place as Prescribed: Yes Pain Present Now: No Electronic Signature(s) Signed: 03/07/2016 11:44:35 AM By: Gretta Cool, RN, BSN, Kim RN, BSN Entered By: Gretta Cool, RN, BSN, Kim on 03/07/2016 10:14:24 [name] -------------------------------------------------------------------------------- Clinic Level of Care Assessment Details TANESHA, VULLO 03/07/2016 10:00 Patient Name: Date of Service: G. AM Medical Record Patient Account Number: [id number] Number: Treating RN: Cornell Barman Date of Birth/Sex: 02/07/1933 (82 y.o. Female) Other Clinician: Primary Care Physician: [name] Treating STONE III, HOYT Referring Physician: [name] Physician/Extender: Suella Grove in Treatment: 4 Clinic Level of Care Assessment Items TOOL 4 Quantity Score []  - Use when only an EandM is performed on FOLLOW-UP visit 0 ASSESSMENTS - Nursing Assessment /  Reassessment []  - Reassessment of Co-morbidities (includes updates in patient status) 0 X - Reassessment of Adherence to Treatment Plan 1 5 ASSESSMENTS - Wound and Skin Assessment / Reassessment X - Simple Wound Assessment / Reassessment - one wound 1 5 []  - Complex Wound Assessment / Reassessment - multiple wounds 0 []  - Dermatologic / Skin Assessment (not related to wound area) 0 ASSESSMENTS - Focused Assessment []  - Circumferential Edema Measurements - multi extremities 0 []  - Nutritional Assessment / Counseling / Intervention 0 []  - Lower Extremity Assessment (monofilament, tuning fork, pulses) 0 []  - Peripheral Arterial Disease Assessment (using hand held doppler) 0 ASSESSMENTS - Ostomy and/or Continence Assessment and Care []  - Incontinence Assessment and Management 0 []  - Ostomy Care Assessment and Management (repouching, etc.) 0 PROCESS - Coordination of Care X - Simple Patient / Family Education for ongoing care 1 15 []  - Complex (extensive) Patient / Family Education for ongoing care 0 []  - Staff obtains Programmer, systems, Records, Test Results / Process Orders 0 []  - Staff telephones HHA, Nursing Homes / Clarify orders / etc 0 [name], [name] []  - Routine Transfer to another Facility (non-emergent condition) 0 []  - Routine Hospital Admission (non-emergent condition) 0 []  - New Admissions / Biomedical engineer / Ordering NPWT, Apligraf, etc. 0 []  - Emergency Hospital Admission (emergent condition) 0 X - Simple Discharge Coordination 1 10 []  - Complex (extensive) Discharge Coordination 0 PROCESS - Special Needs []  - Pediatric / Minor Patient Management 0 []  - Isolation Patient Management 0 []  - Hearing / Language / Visual special needs 0 []  - Assessment of Community assistance (transportation, D/C planning, etc.) 0 []  - Additional assistance / Altered mentation 0 []  - Support Surface(s) Assessment (bed, cushion, seat, etc.) 0 INTERVENTIONS - Wound Cleansing /  Measurement X - Simple Wound Cleansing - one wound 1 5 []  - Complex Wound Cleansing - multiple wounds 0 X - Wound Imaging (photographs - any number of wounds) 1 5 []  -  Wound Tracing (instead of photographs) 0 X - Simple Wound Measurement - one wound 1 5 []  - Complex Wound Measurement - multiple wounds 0 INTERVENTIONS - Wound Dressings X - Small Wound Dressing one or multiple wounds 1 10 []  - Medium Wound Dressing one or multiple wounds 0 []  - Large Wound Dressing one or multiple wounds 0 []  - Application of Medications - topical 0 []  - Application of Medications - injection 0 [name], Elizabeth G. ([id number]) INTERVENTIONS - Miscellaneous []  - External ear exam 0 []  - Specimen Collection (cultures, biopsies, blood, body fluids, etc.) 0 []  - Specimen(s) / Culture(s) sent or taken to Lab for analysis 0 []  - Patient Transfer (multiple staff / Harrel Lemon Lift / Similar devices) 0 []  - Simple Staple / Suture removal (25 or less) 0 []  - Complex Staple / Suture removal (26 or more) 0 []  - Hypo / Hyperglycemic Management (close monitor of Blood Glucose) 0 []  - Ankle / Brachial Index (ABI) - do not check if billed separately 0 X - Vital Signs 1 5 [name]: Yes Total Score: 65 Level Of Care: New/Established - Level 2 Electronic Signature(s) Signed: 03/07/2016 11:44:35 AM By: Gretta Cool, RN, BSN, Kim RN, BSN Entered By: Gretta Cool, RN, BSN, Kim on 03/07/2016 10:33:46 [name], [name] ([id number]) -------------------------------------------------------------------------------- Encounter Discharge Information Details [name], [name] 03/07/2016 10:00 Patient Name: Date of Service: Darnell Level AM Medical Record Patient Account Number: [id number] Number: Treating RN: Cornell Barman Date of Birth/Sex: Sep 18, 1932 (82 y.o. Female) Other Clinician: Primary Care Physician: [name] Treating STONE III, HOYT Referring Physician:  [name] Physician/Extender: Suella Grove in Treatment: 4 Encounter Discharge Information Items Discharge Pain Level: 0 Discharge Condition: Stable Ambulatory Status: Ambulatory Discharge Destination: Home Transportation: Private Auto Accompanied By: self Schedule Follow-up Appointment: Yes Medication Reconciliation completed and provided to Patient/Care Yes Krayton Wortley: Provided on Clinical Summary of Care: 03/07/2016 Form Type Recipient Paper Patient SM Electronic Signature(s) Signed: 03/07/2016 10:36:15 AM By: Ruthine Dose Entered By: Ruthine Dose on 03/07/2016 10:36:14 Hargett, [name] ([id number]) -------------------------------------------------------------------------------- Multi Wound Chart Details [name], [name] 03/07/2016 10:00 Patient Name: Date of Service: Darnell Level AM Medical Record Patient Account Number: [id number] Number: Treating RN: Cornell Barman Date of Birth/Sex: 19-May-1933 (82 y.o. Female) Other Clinician: Primary Care Physician: [name] Treating STONE III, HOYT Referring Physician: [name] Physician/Extender: Suella Grove in Treatment: 4 Vital Signs Height(in): 61 Pulse(bpm): 58 Weight(lbs): 144 Blood Pressure 114/52 (mmHg): Body Mass Index(BMI): 27 Temperature(F): 98.0 Respiratory Rate 16 (breaths/min): Photos: [N/A:N/A] Wound Location: Right Ilium N/A N/A Wounding Event: Surgical Injury N/A N/A Primary Etiology: Abscess N/A N/A Comorbid History: Anemia, Hypertension, N/A N/A Osteomyelitis, Received Chemotherapy Date Acquired: 11/29/2015 N/A N/A Weeks of Treatment: 4 N/A N/A Wound Status: Open N/A N/A Measurements L x W x D 0.6x0.5x4 N/A N/A (cm) Area (cm) : 0.236 N/A N/A Volume (cm) : 0.942 N/A N/A % Reduction in Area: -2850.00% N/A N/A % Reduction in Volume: -2754.50% N/A N/A Classification: Full Thickness Without N/A N/A Exposed Support Structures Exudate Amount: Large N/A N/A Exudate Type:  Purulent N/A N/A Exudate Color: yellow, brown, green N/A N/A MARYSIA, CHALOUPKA. ([id number]) Wound Margin: Indistinct, nonvisible N/A N/A Granulation Amount: Medium (34-66%) N/A N/A Granulation Quality: Pink N/A N/A Necrotic Amount: Medium (34-66%) N/A N/A Exposed Structures: Fascia: No N/A N/A Fat: No Tendon: No Muscle: No Joint: No Bone: No Limited to Skin Breakdown Epithelialization: None N/A N/A Periwound Skin Texture: Edema: Yes N/A N/A Induration: Yes  Excoriation: No Callus: No Crepitus: No Fluctuance: No Friable: No Rash: No Scarring: No Periwound Skin Moist: Yes N/A N/A Moisture: Maceration: No Dry/Scaly: No Periwound Skin Color: Atrophie Blanche: No N/A N/A Cyanosis: No Ecchymosis: No Erythema: No Hemosiderin Staining: No Mottled: No Pallor: No Rubor: No Temperature: No Abnormality N/A N/A Tenderness on No N/A N/A Palpation: Wound Preparation: Ulcer Cleansing: N/A N/A Rinsed/Irrigated with Saline Topical Anesthetic Applied: None Treatment Notes Electronic Signature(s) Signed: 03/07/2016 11:44:35 AM By: Gretta Cool, RN, BSN, Kim RN, BSN Entered By: Gretta Cool, RN, BSN, Kim on 03/07/2016 10:32:50 Bodnar, [name] ([id number]) [name], [name] ([id number]) -------------------------------------------------------------------------------- [place] Details [name], [name] 03/07/2016 10:00 Patient Name: Date of Service: Darnell Level AM Medical Record Patient Account Number: [id number] Number: Treating RN: Cornell Barman Date of Birth/Sex: 07/21/1932 (82 y.o. Female) Other Clinician: Primary Care Physician: [name] Treating STONE III, HOYT Referring Physician: [name] Physician/Extender: Suella Grove in Treatment: 4 Active Inactive Orientation to the Wound Care Program Nursing Diagnoses: Knowledge deficit related to the wound healing center program Goals: Patient/caregiver will verbalize understanding of the  [place] Program Date Initiated: 02/07/2016 Goal Status: Active Interventions: Provide education on orientation to the wound center Notes: Soft Tissue Infection Nursing Diagnoses: Impaired tissue integrity Potential for infection: soft tissue Goals: Patient will remain free of wound infection Date Initiated: 02/07/2016 Goal Status: Active Interventions: Assess signs and symptoms of infection every visit Notes: Wound/Skin Impairment Nursing Diagnoses: Impaired tissue integrity [name], [name] ([id number]) Goals: Patient/caregiver will verbalize understanding of skin care regimen Date Initiated: 02/07/2016 Goal Status: Active Interventions: Assess ulceration(s) every visit Notes: Electronic Signature(s) Signed: 03/07/2016 11:44:35 AM By: Gretta Cool, RN, BSN, Kim RN, BSN Entered By: Gretta Cool, RN, BSN, Kim on 03/07/2016 10:32:42 [name], [name] ([id number]) -------------------------------------------------------------------------------- Pain Assessment Details [name], SAUNDER 03/07/2016 10:00 Patient Name: Date of Service: Darnell Level AM Medical Record Patient Account Number: [id number] Number: Treating RN: Cornell Barman Date of Birth/Sex: Aug 20, 1932 (82 y.o. Female) Other Clinician: Primary Care Physician: [name] Treating STONE III, HOYT Referring Physician: [name] Physician/Extender: Suella Grove in Treatment: 4 Active Problems Location of Pain Severity and Description of Pain Patient Has Paino No Site Locations With Dressing Change: No Pain Management and Medication Current Pain Management: Electronic Signature(s) Signed: 03/07/2016 11:44:35 AM By: Gretta Cool, RN, BSN, Kim RN, BSN Entered By: Gretta Cool, RN, BSN, Kim on 03/07/2016 10:07:14 [name] ([id number]) -------------------------------------------------------------------------------- Patient/Caregiver Education Details MARSHIA, BERGAN 03/07/2016 10:00 Patient  Name: Date of Service: Darnell Level AM Medical Record Patient Account Number: [id number] Number: Treating RN: Cornell Barman Date of Birth/Gender: 02-09-1933 (82 y.o. Female) Other Clinician: Primary Care Physician: [name] Treating STONE III, HOYT Referring Physician: [name] Physician/Extender: Suella Grove in Treatment: 4 Education Assessment Education Provided To: Caregiver Education Topics Provided Wound/Skin Impairment: Handouts: Caring for Your Ulcer, Other: wound care as prescribed Methods: Demonstration, Explain/Verbal Responses: State content correctly Electronic Signature(s) Signed: 03/07/2016 11:44:35 AM By: Gretta Cool, RN, BSN, Kim RN, BSN Entered By: Gretta Cool, RN, BSN, Kim on 03/07/2016 10:34:56 Weekley, [name] ([id number]) -------------------------------------------------------------------------------- Wound Assessment Details TERNISHA, OLANDER 03/07/2016 10:00 Patient Name: Date of Service: Darnell Level AM Medical Record Patient Account Number: [id number] Number: Treating RN: Cornell Barman Date of Birth/Sex: 1933-02-05 (82 y.o. Female) Other Clinician: Primary Care Physician: [name] Treating STONE III, HOYT Referring Physician: [name] Physician/Extender: Suella Grove in Treatment: 4 Wound Status Wound Number: 1 Primary Abscess Etiology: Wound Location: Right Ilium Wound Open Wounding Event: Surgical Injury Status: Date Acquired: 11/29/2015 Comorbid Anemia, Hypertension, Osteomyelitis, Weeks Of Treatment:  4 History: Received Chemotherapy Clustered Wound: No Photos Wound Measurements Length: (cm) 0.6 Width: (cm) 0.5 Depth: (cm) 4 Area: (cm) 0.236 Volume: (cm) 0.942 % Reduction in Area: -2850% % Reduction in Volume: -2754.5% Epithelialization: None Tunneling: No Undermining: No Wound Description Full Thickness Without Exposed Classification: Support Structures Wound Margin: Indistinct,  nonvisible Exudate Large Amount: Exudate Type: Purulent Exudate Color: yellow, brown, green Foul Odor After Cleansing: No Wound Bed Granulation Amount: Medium (34-66%) Exposed Structure Granulation Quality: Pink Fascia Exposed: No Baity, Jacie G. ([id number]) Necrotic Amount: Medium (34-66%) Fat Layer Exposed: No Necrotic Quality: Adherent Slough Tendon Exposed: No Muscle Exposed: No Joint Exposed: No Bone Exposed: No Limited to Skin Breakdown Periwound Skin Texture Texture Color No Abnormalities Noted: No No Abnormalities Noted: No Callus: No Atrophie Blanche: No Crepitus: No Cyanosis: No Excoriation: No Ecchymosis: No Fluctuance: No Erythema: No Friable: No Hemosiderin Staining: No Induration: Yes Mottled: No Localized Edema: Yes Pallor: No Rash: No Rubor: No Scarring: No Temperature / Pain Moisture Temperature: No Abnormality No Abnormalities Noted: No Dry / Scaly: No Maceration: No Moist: Yes Wound Preparation Ulcer Cleansing: Rinsed/Irrigated with Saline Topical Anesthetic Applied: None Treatment Notes Wound #1 (Right Ilium) 1. Cleansed with: Clean wound with Normal Saline 2. Anesthetic Topical Lidocaine 4% cream to wound bed prior to debridement 4. Dressing Applied: Aquacel Ag 5. Secondary Dressing Applied Bordered Foam Dressing Electronic Signature(s) Signed: 03/07/2016 11:44:35 AM By: Gretta Cool, RN, BSN, Kim RN, BSN Entered By: Gretta Cool, RN, BSN, Kim on 03/07/2016 10:12:43 Fedor, [name] -------------------------------------------------------------------------------- Vitals Details ELAIJAH, DOANE 03/07/2016 10:00 Patient Name: Date of Service: Darnell Level AM Medical Record Patient Account Number: [id number] Number: Treating RN: Cornell Barman Date of Birth/Sex: 07-May-1933 (82 y.o. Female) Other Clinician: Primary Care Physician: [name] Treating STONE III, HOYT Referring Physician: [name] Physician/Extender: Suella Grove in Treatment: 4 Vital Signs Time Taken: 10:08 Temperature (F): 98.0 Height (in): 61 Pulse (bpm): 58 Weight (lbs): 144 Respiratory Rate (breaths/min): 16 Body Mass Index (BMI): 27.2 Blood Pressure (mmHg): 114/52 Reference Range: 80 - 120 mg / dl Electronic Signature(s) Signed: 03/07/2016 11:44:35 AM By: Gretta Cool, RN, BSN, Kim RN, BSN Entered By: Gretta Cool, RN, BSN, Kim on 03/07/2016 10:08:38

## 2016-03-08 NOTE — Progress Notes (Signed)
[name], [name] Visit Report for 03/07/2016 Chief Complaint Document Details [name], [name] 03/07/2016 10:00 Patient Name: Date of Service: G. Hahn Medical Record Patient Account Number: [id number] Number: Treating RN: [name] Date of Birth/Sex: [birth year] (82 y.o. Female) Other Clinician: Primary Care Treating [name] Physician: Physician/Extender: Referring Physician: Jenelle Hahn in Treatment: 4 Information Obtained from: Patient Chief Complaint 03/07/16 patient is here for review of a draining surgical abscess site on the right buttock Electronic Signature(s) Signed: 03/07/2016 4:52:13 PM By: Elizabeth Keeler PA-C Entered By: [name] on 03/07/2016 10:58:05 [name], [name] -------------------------------------------------------------------------------- HPI Details [name], [name] 03/07/2016 10:00 Patient Name: Date of Service: [name] Medical Record Patient Account Number: [id number] Number: Treating RN: [name] Date of Birth/Sex: January 24, 1933 (82 y.o. Female) Other Clinician: Primary Care Treating [name] Physician: Physician/Extender: Referring Physician: Jenelle Hahn in Treatment: 4 History of Present Illness HPI Description: 02/07/16; this is a pleasant and functional 82 year old woman who is here for our review of a draining sinus from and abscess site on her right buttock. The history is that she noted a swelling in March that she could feel herself however there was no pain. Ultimately she was referred by her primary doctor to a general surgeon Dr. Jamal Hahn. She underwent a drain placement on 4/20. The drain was removed on 5/11. I think she had an IandD on 5/26 with a second drain removed on 6/26. At that point the area had actually closed over however reopened on 7/4 with what the patient describes as a sudden  onset of pain and then she felt that the drainage. 2 cultures of this area did not culture anything either aerobically or anaerobically. There was a culture on 7/5 that showed a bacillus species. I don't think this was specifically targeted with antibiotics. He did receive a 10 day course of oral antibiotics at some point in this. She was also recently referred to a neurosurgeon in [place] [name]. The issue here was that the patient has had multiple lower back surgeries and I think her general surgeon was concerned that this could be a source of infection for the abscess in the right buttock area. Her last CT scan was on 5/1. At that point she had a drain in place. She was noted to have a residual fluid collection measuring 2.2 x 1.5 in the right gluteal muscles. She was referred here by Dr. Ouida Hahn who is her primary physician. I note that she is also referred her to infectious disease which seems reasonable. The patient states that she is not been systemically unwell no fever no chills. She continues to be active. Area is not painful and really never has been except for the recent reopening in early July. 02/14/16; the culture I did of this area last week was negative. The area measures 3.3 cm in depth we have been using silver alginate rope packing 02/21/16; as noted above the patient went to see Dr. Ola Hahn. A CT scan of her abdomen and pelvis was ordered. This showed a 1.6 x 4.6 x 4.5 fluid correction at the previous site of her drain. There is also an adjacent fluid collection on the other side of the iliac crest during 4.9 x 1 point 4 x 4 0.4 whether there was connection with spinal hardware was unclear based on the CT scan. Apparently an MRI has been ordered. When the patient was at Dr. Blane Hahn office today fluid  just poured out of this. His ofice put a plain dressing over the top by the time she got here the dressing was saturated. 03/07/16 patient continues to be  followed by Dr. Ola Hahn. She currently is being managed regard to his right hip abscess with vancomycin and Zosyn through a PICC line. Shee tells me that she is having a lot less drainage at this point in time and is pleased with the care that she is receiving currently. Fortunately she has no other worsening symptoms no signs of systemic infection and tells me that she is not having any pain and in fact really hasn't throughout the course of this. Electronic Signature(s) [name] ([id number]) Signed: 03/07/2016 4:52:13 PM By: Elizabeth Keeler PA-C Entered By: [name] on 03/07/2016 10:59:19 [name], [name] ([id number]) -------------------------------------------------------------------------------- Physical Exam Details [name], [name] 03/07/2016 10:00 Patient Name: Date of Service: [name] Medical Record Patient Account Number: [id number] Number: Treating RN: [name] Date of Birth/Sex: 09/07/1932 (82 y.o. Female) Other Clinician: Primary Care Treating [name] Physician: Physician/Extender: Referring Physician: Jenelle Hahn in Treatment: 4 Constitutional Well-nourished and well-hydrated in no acute distress. Respiratory normal breathing without difficulty. Psychiatric this patient is able to make decisions and demonstrates good insight into disease process. Alert and Oriented x 3. pleasant and cooperative. Notes Patient's wound appears to be about the same and still has tracking internally noted at this point in time today. Unfortunately that doesn't seem to have progressed significantly although the tracking was not quite as deep as previous. Again she is being managed by Dr. Ola Hahn who is infectious disease at this point in time. Electronic Signature(s) Signed: 03/07/2016 4:52:13 PM By: Elizabeth Keeler PA-C Entered By: [name] on 03/07/2016 11:00:39 [name], [name]  ([id number]) -------------------------------------------------------------------------------- Physician Orders Details [name], [name] 03/07/2016 10:00 Patient Name: Date of Service: [name] Medical Record Patient Account Number: [id number] Number: Treating RN: [name] Date of Birth/Sex: 20-Jan-1933 (82 y.o. Female) Other Clinician: Primary Care Treating [name], [place] Physician: Physician/Extender: Referring Physician: Jenelle Hahn in Treatment: 4 Verbal / Phone Orders: Yes Clinician: Cornell Hahn Read Back and Verified: Yes Diagnosis Coding Wound Cleansing Wound #1 Right Ilium o Clean wound with Normal Saline. Primary Wound Dressing Wound #1 Right Ilium o Aquacel Ag Secondary Dressing Wound #1 Right Ilium o Boardered Foam Dressing Dressing Change Frequency Wound #1 Right Ilium o Change dressing every day. Follow-up Appointments Wound #1 Right Ilium o Return Appointment in 2 weeks. Electronic Signature(s) Signed: 03/07/2016 11:44:35 Hahn By: Gretta Cool RN, BSN, Kim RN, BSN Signed: 03/07/2016 4:52:13 PM By: Elizabeth Keeler PA-C Entered By: Gretta Cool RN, BSN, Kim on 03/07/2016 10:33:23 Creedon, [name] ([id number]) -------------------------------------------------------------------------------- Problem List Details [name], [name] 03/07/2016 10:00 Patient Name: Date of Service: [name] Medical Record Patient Account Number: [id number] Number: Treating RN: [name] Date of Birth/Sex: 05-02-1933 (82 y.o. Female) Other Clinician: Primary Care Treating [name] Physician: Physician/Extender: Referring Physician: Jenelle Hahn in Treatment: 4 Active Problems ICD-10 Encounter Code Description Active Date Diagnosis L02.31 Cutaneous abscess of buttock 02/07/2016 Yes T81.31XD Disruption of external operation (surgical) wound, not 02/07/2016 Yes elsewhere classified,  subsequent encounter Inactive Problems Resolved Problems Electronic Signature(s) Signed: 03/07/2016 4:52:13 PM By: Elizabeth Keeler PA-C Entered By: [name] on 03/07/2016 10:57:30 Marter, [name] ([id number]) -------------------------------------------------------------------------------- Progress Note Details [name], [name] 03/07/2016 10:00 Patient Name: Date of Service: G. Hahn Medical Record Patient Account  Number: [id number] Number: Treating RN: [name] Date of Birth/Sex: [birth year] (83 y.o. Female) Other Clinician: Primary Care Treating [name] Physician: Physician/Extender: Referring Physician: Jenelle Hahn in Treatment: 4 Subjective Chief Complaint Information obtained from Patient 03/07/16 patient is here for review of a draining surgical abscess site on the right buttock History of Present Illness (HPI) 02/07/16; this is a pleasant and functional 82 year old woman who is here for our review of a draining sinus from and abscess site on her right buttock. The history is that she noted a swelling in March that she could feel herself however there was no pain. Ultimately she was referred by her primary doctor to a general surgeon Dr. Jamal Hahn. She underwent a drain placement on 4/20. The drain was removed on 5/11. I think she had an IandD on 5/26 with a second drain removed on 6/26. At that point the area had actually closed over however reopened on 7/4 with what the patient describes as a sudden onset of pain and then she felt that the drainage. 2 cultures of this area did not culture anything either aerobically or anaerobically. There was a culture on 7/5 that showed a bacillus species. I don't think this was specifically targeted with antibiotics. He did receive a 10 day course of oral antibiotics at some point in this. She was also recently referred to a neurosurgeon in [place] [name]. The  issue here was that the patient has had multiple lower back surgeries and I think her general surgeon was concerned that this could be a source of infection for the abscess in the right buttock area. Her last CT scan was on 5/1. At that point she had a drain in place. She was noted to have a residual fluid collection measuring 2.2 x 1.5 in the right gluteal muscles. She was referred here by Dr. Ouida Hahn who is her primary physician. I note that she is also referred her to infectious disease which seems reasonable. The patient states that she is not been systemically unwell no fever no chills. She continues to be active. Area is not painful and really never has been except for the recent reopening in early July. 02/14/16; the culture I did of this area last week was negative. The area measures 3.3 cm in depth we have been using silver alginate rope packing 02/21/16; as noted above the patient went to see Dr. Ola Hahn. A CT scan of her abdomen and pelvis was ordered. This showed a 1.6 x 4.6 x 4.5 fluid correction at the previous site of her drain. There is also an adjacent fluid collection on the other side of the iliac crest during 4.9 x 1 point 4 x 4 0.4 whether there was connection with spinal hardware was unclear based on the CT scan. Apparently an MRI has been ordered. When the patient was at Dr. Blane Hahn office today fluid just poured out of this. His ofice put a plain dressing over the top by the time she got here the dressing was saturated. 03/07/16 patient continues to be followed by Dr. Ola Hahn. She currently is being managed regard to his right hip abscess with vancomycin and Zosyn through a PICC line. Shee tells me that she is having a lot [name], [place]. ([id number]) less drainage at this point in time and is pleased with the care that she is receiving currently. Fortunately she has no other worsening symptoms no signs of systemic infection and tells me that she is not  having any pain and in fact really hasn't throughout the course of this. Objective Constitutional Well-nourished and well-hydrated in no acute distress. Vitals Time Taken: 10:08 Hahn, Height: 61 in, Weight: 144 lbs, BMI: 27.2, Temperature: 98.0 F, Pulse: 58 bpm, Respiratory Rate: 16 breaths/min, Blood Pressure: 114/52 mmHg. Respiratory normal breathing without difficulty. Psychiatric this patient is able to make decisions and demonstrates good insight into disease process. Alert and Oriented x 3. pleasant and cooperative. General Notes: Patient's wound appears to be about the same and still has tracking internally noted at this point in time today. Unfortunately that doesn't seem to have progressed significantly although the tracking was not quite as deep as previous. Again she is being managed by Dr. Ola Hahn who is infectious disease at this point in time. Integumentary (Hair, Skin) Wound #1 status is Open. Original cause of wound was Surgical Injury. The wound is located on the Right Ilium. The wound measures 0.6cm length x 0.5cm width x 4cm depth; 0.236cm^2 area and 0.942cm^3 volume. The wound is limited to skin breakdown. There is no tunneling or undermining noted. There is a large amount of purulent drainage noted. The wound margin is indistinct and nonvisible. There is medium (34-66%) pink granulation within the wound bed. There is a medium (34-66%) amount of necrotic tissue within the wound bed including Adherent Slough. The periwound skin appearance exhibited: Induration, Localized Edema, Moist. The periwound skin appearance did not exhibit: Callus, Crepitus, Excoriation, Fluctuance, Friable, Rash, Scarring, Dry/Scaly, Maceration, Atrophie Blanche, Cyanosis, Ecchymosis, Hemosiderin Staining, Mottled, Pallor, Rubor, Erythema. Periwound temperature was noted as No Abnormality. Assessment Active Problems [name], [name] ICD-10 L02.31 - Cutaneous abscess of  buttock T81.31XD - Disruption of external operation (surgical) wound, not elsewhere classified, subsequent encounter Plan Wound Cleansing: Wound #1 Right Ilium: Clean wound with Normal Saline. Primary Wound Dressing: Wound #1 Right Ilium: Aquacel Ag Secondary Dressing: Wound #1 Right Ilium: Boardered Foam Dressing Dressing Change Frequency: Wound #1 Right Ilium: Change dressing every day. Follow-up Appointments: Wound #1 Right Ilium: Return Appointment in 2 weeks. Follow-Up Appointments: A follow-up appointment should be scheduled. Medication Reconciliation completed and provided to Patient/Care Provider. A Patient Clinical Summary of Care was provided to [facility] Currently him do recommend that she continue with current treatment with Dr. Ola Hahn. We will continue with the silveer alginate dressings to help collect any drainage from the abscess. However the biggest issue is that she may require surgery in order to evacuate the abscess collecting iin this right hip region. Right now although through the PICC line Dr. Ola Hahn is trying to manage her infection medically with antibiotics including Zosyn and vancomycin. We will see her back for follow-up visit in 2 weeks to reevaluate and see where things stand at that point in time she is in agreement with this plan. All questions were encouraged and answered to the best my ability during the office visit today. [name], [name] Electronic Signature(s) Signed: 03/07/2016 4:52:13 PM By: Elizabeth Keeler PA-C Entered By: [name] on 03/07/2016 11:01:56 [name], [name] -------------------------------------------------------------------------------- SuperBill Details Patient Name: [name] Date of Service: 03/07/2016 Medical Record Number: [id number] Patient Account Number: [id number] Date of Birth/Sex: 1932/06/26 (83 y.o. Female) Treating RN: [name] Primary Care Physician:  [name] Other Clinician: Referring Physician: [name] Treating Physician/Extender: Melburn Hake, Hahn Weeks in Treatment: 4 Diagnosis Coding ICD-10 Codes Code Description L02.31 Cutaneous abscess of buttock Disruption of external operation (surgical) wound, not elsewhere classified, subsequent T81.31XD encounter Facility Procedures  CPT4 Code: [id number] Description: [id number] - WOUND CARE VISIT-LEV 2 EST PT Modifier: Quantity: 1 Physician Procedures CPT4: Description Modifier Quantity Code [id number] - WC PHYS Hahn 3 - EST PT 1 ICD-10 Description Diagnosis L02.31 Cutaneous abscess of buttock T81.31XD Disruption of external operation (surgical) wound, not elsewhere classified, subsequent  encounter Electronic Signature(s) Signed: 03/07/2016 4:52:13 PM By: Elizabeth Keeler PA-C Entered By: [name] on 03/07/2016 11:02:25

## 2016-03-09 ENCOUNTER — Ambulatory Visit: Payer: Medicare Other | Admitting: Urology

## 2016-03-14 ENCOUNTER — Other Ambulatory Visit: Payer: Self-pay | Admitting: *Deleted

## 2016-03-14 DIAGNOSIS — D509 Iron deficiency anemia, unspecified: Secondary | ICD-10-CM

## 2016-03-14 NOTE — Progress Notes (Addendum)
[facility]  [phone]  [phone]     [name] DOB: [birth year]  MR#: [id number]  EPP#:[id number]  Patient Care Team: Kirk Ruths, MD as PCP - General (Internal Medicine) Hollice Espy, MD as Consulting Physician (Urology) Kirk Ruths, MD (Internal Medicine) Seeplaputhur Robinette Haines, MD (General Surgery)  CHIEF COMPLAINT:  Iron deficiency anemia, unspecified.  INTERVAL HISTORY: Patient returns to clinic today for repeat laboratory work and routine evaluation. She was recently hospitalized for an infection which ultimately required a PICC line placement in 6 weeks of IV antibiotics. She currently is approximately 3 weeks into her treatment. She currently feels well. She denies any fevers. She has no neurologic complaints. She does not complain of any weakness or fatigue today. She has no chest pain or shortness of breath. She denies any nausea, vomiting, constipation, or diarrhea. She has noted no melena or hematochezia. She has no urinary complaints.   REVIEW OF SYSTEMS:   Review of Systems  Constitutional: Negative.  Negative for fever, malaise/fatigue and weight loss.  Respiratory: Negative.  Negative for cough and shortness of breath.   Cardiovascular: Negative.  Negative for chest pain and leg swelling.  Gastrointestinal: Negative.  Negative for abdominal pain, blood in stool and melena.  Musculoskeletal: Negative.   Neurological: Negative.  Negative for weakness.  Psychiatric/Behavioral: Negative.  The patient is not nervous/anxious.     As per HPI. Otherwise, a complete review of systems is negative.   PAST MEDICAL HISTORY: Past Medical History:  Diagnosis Date  . Anemia   . Cancer [facility]) 2016   bladder tumor  . Carotid arterial disease ([place]) 01/09/2014   Overview:  Minimal on screening   . Chronic low back pain   . Colon polyp   . DDD (degenerative disc disease), cervical   . DDD (degenerative disc disease),  lumbar   . Elevated lipids   . Foot pain   . Glaucoma   . Hypertension   . Multiple gastric ulcers   . Osteoporosis     PAST SURGICAL HISTORY: Past Surgical History:  Procedure Laterality Date  . ABDOMINAL HYSTERECTOMY  1973  . BACK SURGERY     03/1975-ruptured disk, 11/1983-ruptured disk, 11/1984-ruptured disk, 10/1996-ruptured disk, 08/1997-spinal fusion, 02/10/07-spinal fusion [facility]  . CARPAL TUNNEL RELEASE Right   . CATARACT EXTRACTION Bilateral   . CYSTOSCOPY W/ RETROGRADES Bilateral 09/29/2014   Procedure: CYSTOSCOPY WITH RETROGRADE PYELOGRAM;  Surgeon: Hollice Espy, MD;  Location: [facility] ORS;  Service: Urology;  Laterality: Bilateral;  . CYSTOSCOPY WITH BIOPSY N/A 11/02/2014   Procedure: CYSTOSCOPY WITH BIOPSY/WITH MITOMYCIN;  Surgeon: Hollice Espy, MD;  Location: [facility] ORS;  Service: Urology;  Laterality: N/A;  . ESOPHAGOGASTRODUODENOSCOPY (EGD) WITH PROPOFOL N/A 05/10/2015   Procedure: ESOPHAGOGASTRODUODENOSCOPY (EGD) WITH PROPOFOL;  Surgeon: Lollie Sails, MD;  Location: [facility] ENDOSCOPY;  Service: Endoscopy;  Laterality: N/A;  . EYE SURGERY    . FOOT SURGERY Right   . IRRIGATION AND DEBRIDEMENT BUTTOCKS Right 10/21/2015   Procedure: DRAINAGE GLUTEAL ABSCESS;  Surgeon: Christene Lye, MD;  Location: [facility] ORS;  Service: General;  Laterality: Right;  . KNEE ARTHROSCOPY Right    x2  . MANDIBLE SURGERY Bilateral    x2  . SHOULDER ARTHROSCOPY WITH ROTATOR CUFF REPAIR AND SUBACROMIAL DECOMPRESSION Right 05/25/2015   Procedure: SHOULDER ARTHROSCOPY WITH ROTATOR CUFF REPAIR AND SUBACROMIAL DECOMPRESSION, release long head biceps tendon;  Surgeon: Leanor Kail, MD;  Location: [facility] ORS;  Service: Orthopedics;  Laterality: Right;  . TRANSURETHRAL RESECTION  OF BLADDER TUMOR N/A 09/29/2014   Procedure: TRANSURETHRAL RESECTION OF BLADDER TUMOR (TURBT);  Surgeon: Hollice Espy, MD;  Location: [facility] ORS;  Service: Urology;  Laterality: N/A;  . WRIST FRACTURE SURGERY Left      FAMILY HISTORY Family History  Problem Relation Age of Onset  . Heart attack Father   . Emphysema Father   . Heart disease Mother   . Hypertension Mother   . Diabetes type II Sister   . Kidney disease Neg Hx   . Bladder Cancer Neg Hx   . Breast cancer Neg Hx     GYNECOLOGIC HISTORY:  No LMP recorded. Patient has had a hysterectomy.     ADVANCED DIRECTIVES:    HEALTH MAINTENANCE: Social History  Substance Use Topics  . Smoking status: Never Smoker  . Smokeless tobacco: Never Used  . Alcohol use No   No Known Allergies  Current Outpatient Prescriptions  Medication Sig Dispense Refill  . acetaminophen (TYLENOL) 650 MG CR tablet Take 650 mg by mouth every 8 (eight) hours as needed for pain. Reported on 11/18/2015    . Ascorbic Acid (VITAMIN C) 1000 MG tablet Take 1,000 mg by mouth daily.    . calcium-vitamin D (OSCAL WITH D) 500-200 MG-UNIT per tablet Take 2 tablets by mouth 2 (two) times daily.     . carvedilol (COREG) 25 MG tablet Take 25 mg by mouth 2 (two) times daily with a meal.    . diltiazem (CARDIZEM CD) 240 MG 24 hr capsule Take 240 mg by mouth daily.    [name] latanoprost (XALATAN) 0.005 % ophthalmic solution Place 1 drop into both eyes at bedtime.    [name] PIPERACILLIN SOD-TAZOBACTAM SO IV Inject 3.375 g into the vein 3 (three) times daily.    . pravastatin (PRAVACHOL) 40 MG tablet Take 40 mg by mouth daily at 6 PM.    . raloxifene (EVISTA) 60 MG tablet Take 60 mg by mouth daily.    [name] VANCOMYCIN HCL IV Inject 1 g into the vein daily.     Current Facility-Administered Medications  Medication Dose Route Frequency Provider Last Rate Last Dose  . BCG vaccine (THERACYS) injection 81 mg  81 mg Bladder Instillation Once [employer], FNP      . sodium chloride flush (NS) 0.9 % injection 50 mL  50 mL Intracatheter Once [employer], PA-C        OBJECTIVE: BP (!) 152/69 (BP Location: Right Arm, Patient Position: Sitting)   Pulse (!) 59   Temp 98.7 F (37.1 C)  (Tympanic)   Resp 18   Wt 144 lb 6.4 oz (65.5 kg)   BMI 27.28 kg/m    Body mass index is 27.28 kg/m.    ECOG FS:1 - Symptomatic but completely ambulatory  General: Well-developed, well-nourished, no acute distress. Eyes: Pink conjunctiva, anicteric sclera. HEENT: Normocephalic, moist mucous membranes, clear oropharnyx. Lungs: Clear to auscultation bilaterally. Heart: Regular rate and rhythm. No rubs, murmurs, or gallops. Abdomen: Soft, nontender, nondistended. No organomegaly noted, normoactive bowel sounds. Musculoskeletal: No edema, cyanosis, or clubbing.   Neuro: Alert, answering all questions appropriately. Cranial nerves grossly intact. Skin: No rashes or petechiae noted. Psych: Normal affect. Lymphatics: No cervical, clavicular, or axillary LAD.   LAB RESULTS:  Appointment on 03/15/2016  Component Date Value Ref Range Status  . WBC 03/15/2016 5.7  3.6 - 11.0 K/uL Final  . RBC 03/15/2016 3.14* 3.80 - 5.20 MIL/uL Final  . Hemoglobin 03/15/2016 9.0* 12.0 - 16.0 g/dL Final  .  HCT 03/15/2016 26.8* 35.0 - 47.0 % Final  . MCV 03/15/2016 85.4  80.0 - 100.0 fL Final  . MCH 03/15/2016 28.6  26.0 - 34.0 pg Final  . MCHC 03/15/2016 33.5  32.0 - 36.0 g/dL Final  . RDW 03/15/2016 19.3* 11.5 - 14.5 % Final  . Platelets 03/15/2016 259  150 - 440 K/uL Final  . Neutrophils Relative % 03/15/2016 58  % Final  . Neutro Abs 03/15/2016 3.3  1.4 - 6.5 K/uL Final  . Lymphocytes Relative 03/15/2016 22  % Final  . Lymphs Abs 03/15/2016 1.2  1.0 - 3.6 K/uL Final  . Monocytes Relative 03/15/2016 13  % Final  . Monocytes Absolute 03/15/2016 0.7  0.2 - 0.9 K/uL Final  . Eosinophils Relative 03/15/2016 6  % Final  . Eosinophils Absolute 03/15/2016 0.4  0 - 0.7 K/uL Final  . Basophils Relative 03/15/2016 1  % Final  . Basophils Absolute 03/15/2016 0.1  0 - 0.1 K/uL Final  . Ferritin 03/15/2016 261  11 - 307 ng/mL Final  . Iron 03/15/2016 31  28 - 170 ug/dL Final  . TIBC 03/15/2016 222* 250 - 450  ug/dL Final  . Saturation Ratios 03/15/2016 14  10.4 - 31.8 % Final  . UIBC 03/15/2016 192  ug/dL Final    STUDIES: No results found.  ASSESSMENT: Iron deficiency anemia, MGUS.  PLAN:   1. Iron deficiency anemia: Possibly secondary to blood loss from gastritis. Her hemoglobin is currently 9.0, her iron stores are mildly decreased, therefore will give one infusion of 510 mg IV Feraheme today. Patient has also been instructed to continue her oral iron supplementation. It is also possible patient may have a component of MDS. Previously a bone marrow biopsy was discussed by another provider, but patient declined. Return to clinic in 3 months with repeat laboratory work and further evaluation. 2. MGUS: Patient is most recent M spike on December 14, 2015 was reported as 1.1. This is essentially unchanged from previous. Other than her anemia she has no evidence of endorgan disease. Continue to monitor every 6 months. 3. Superficial bladder cancer: Continue follow-up and treatment per urology.  Patient expressed understanding and was in agreement with this plan. She also understands that She can call clinic at any time with any questions, concerns, or complaints.    Lloyd Huger, MD   03/22/2016 9:50 AM

## 2016-03-15 ENCOUNTER — Ambulatory Visit: Payer: Medicare Other | Admitting: Urology

## 2016-03-15 ENCOUNTER — Inpatient Hospital Stay (HOSPITAL_BASED_OUTPATIENT_CLINIC_OR_DEPARTMENT_OTHER): Payer: Medicare Other | Admitting: Oncology

## 2016-03-15 ENCOUNTER — Inpatient Hospital Stay: Payer: Medicare Other | Attending: Oncology

## 2016-03-15 VITALS — BP 152/69 | HR 59 | Temp 98.7°F | Resp 18 | Wt 144.4 lb

## 2016-03-15 DIAGNOSIS — C679 Malignant neoplasm of bladder, unspecified: Secondary | ICD-10-CM | POA: Diagnosis not present

## 2016-03-15 DIAGNOSIS — I10 Essential (primary) hypertension: Secondary | ICD-10-CM | POA: Diagnosis not present

## 2016-03-15 DIAGNOSIS — M5030 Other cervical disc degeneration, unspecified cervical region: Secondary | ICD-10-CM

## 2016-03-15 DIAGNOSIS — Z8551 Personal history of malignant neoplasm of bladder: Secondary | ICD-10-CM

## 2016-03-15 DIAGNOSIS — G8929 Other chronic pain: Secondary | ICD-10-CM | POA: Insufficient documentation

## 2016-03-15 DIAGNOSIS — D509 Iron deficiency anemia, unspecified: Secondary | ICD-10-CM

## 2016-03-15 DIAGNOSIS — M5136 Other intervertebral disc degeneration, lumbar region: Secondary | ICD-10-CM

## 2016-03-15 DIAGNOSIS — Z79899 Other long term (current) drug therapy: Secondary | ICD-10-CM | POA: Diagnosis not present

## 2016-03-15 DIAGNOSIS — Z86010 Personal history of colonic polyps: Secondary | ICD-10-CM | POA: Insufficient documentation

## 2016-03-15 DIAGNOSIS — I2510 Atherosclerotic heart disease of native coronary artery without angina pectoris: Secondary | ICD-10-CM

## 2016-03-15 DIAGNOSIS — M818 Other osteoporosis without current pathological fracture: Secondary | ICD-10-CM | POA: Diagnosis not present

## 2016-03-15 DIAGNOSIS — D472 Monoclonal gammopathy: Secondary | ICD-10-CM | POA: Insufficient documentation

## 2016-03-15 DIAGNOSIS — R7989 Other specified abnormal findings of blood chemistry: Secondary | ICD-10-CM | POA: Insufficient documentation

## 2016-03-15 DIAGNOSIS — M545 Low back pain: Secondary | ICD-10-CM | POA: Diagnosis not present

## 2016-03-15 LAB — IRON AND TIBC
IRON: 31 ug/dL (ref 28–170)
SATURATION RATIOS: 14 % (ref 10.4–31.8)
TIBC: 222 ug/dL — AB (ref 250–450)
UIBC: 192 ug/dL

## 2016-03-15 LAB — CBC WITH DIFFERENTIAL/PLATELET
BASOS ABS: 0.1 10*3/uL (ref 0–0.1)
BASOS PCT: 1 %
Eosinophils Absolute: 0.4 10*3/uL (ref 0–0.7)
Eosinophils Relative: 6 %
HEMATOCRIT: 26.8 % — AB (ref 35.0–47.0)
Hemoglobin: 9 g/dL — ABNORMAL LOW (ref 12.0–16.0)
LYMPHS PCT: 22 %
Lymphs Abs: 1.2 10*3/uL (ref 1.0–3.6)
MCH: 28.6 pg (ref 26.0–34.0)
MCHC: 33.5 g/dL (ref 32.0–36.0)
MCV: 85.4 fL (ref 80.0–100.0)
Monocytes Absolute: 0.7 10*3/uL (ref 0.2–0.9)
Monocytes Relative: 13 %
NEUTROS ABS: 3.3 10*3/uL (ref 1.4–6.5)
Neutrophils Relative %: 58 %
Platelets: 259 10*3/uL (ref 150–440)
RBC: 3.14 MIL/uL — AB (ref 3.80–5.20)
RDW: 19.3 % — ABNORMAL HIGH (ref 11.5–14.5)
WBC: 5.7 10*3/uL (ref 3.6–11.0)

## 2016-03-15 LAB — FERRITIN: Ferritin: 261 ng/mL (ref 11–307)

## 2016-03-15 NOTE — Progress Notes (Signed)
States is feeling well. Recently hospitalized for infection and was discharged with PICC line in left arm to receive IV abx for total of 6 weeks. Pt has already received 3 weeks of IV abx today.

## 2016-03-16 ENCOUNTER — Ambulatory Visit: Payer: Medicare Other | Admitting: Urology

## 2016-03-16 ENCOUNTER — Inpatient Hospital Stay: Payer: Medicare Other

## 2016-03-16 VITALS — BP 151/70 | HR 54 | Temp 96.3°F | Resp 18

## 2016-03-16 DIAGNOSIS — D509 Iron deficiency anemia, unspecified: Secondary | ICD-10-CM

## 2016-03-16 MED ORDER — SODIUM CHLORIDE 0.9 % IJ SOLN
10.0000 mL | INTRAMUSCULAR | Status: DC | PRN
  Filled 2016-03-16: qty 10

## 2016-03-16 MED ORDER — HEPARIN SOD (PORK) LOCK FLUSH 100 UNIT/ML IV SOLN: 250.0000 [IU] | Freq: Once | INTRAVENOUS | Status: DC | PRN

## 2016-03-16 MED ORDER — SODIUM CHLORIDE 0.9 % IV SOLN
Freq: Once | INTRAVENOUS | Status: AC
  Administered 2016-03-16: 14:00:00 via INTRAVENOUS
  Filled 2016-03-16: qty 1000

## 2016-03-16 MED ORDER — SODIUM CHLORIDE 0.9% FLUSH
10.0000 mL | INTRAVENOUS | Status: AC | PRN
  Administered 2016-03-16: 10 mL
  Filled 2016-03-16: qty 10

## 2016-03-16 MED ORDER — HEPARIN SOD (PORK) LOCK FLUSH 100 UNIT/ML IV SOLN
250.0000 [IU] | INTRAVENOUS | Status: AC | PRN
  Administered 2016-03-16: 250 [IU]
  Filled 2016-03-16: qty 5

## 2016-03-16 MED ORDER — SODIUM CHLORIDE 0.9 % IV SOLN
510.0000 mg | Freq: Once | INTRAVENOUS | Status: AC
  Administered 2016-03-16: 510 mg via INTRAVENOUS
  Filled 2016-03-16: qty 17

## 2016-03-21 ENCOUNTER — Encounter: Payer: Medicare Other | Admitting: Internal Medicine

## 2016-03-21 DIAGNOSIS — L0231 Cutaneous abscess of buttock: Secondary | ICD-10-CM | POA: Diagnosis not present

## 2016-03-22 NOTE — Progress Notes (Signed)
[name], [name] Visit Report for 03/21/2016 Chief Complaint Document Details [name] Date of Service: 03/21/2016 10:45 AM Patient Name: G. Patient Account Number: [id number] Medical Record Treating RN: Montey Hora [id number] Number: Other Clinician: Date of Birth/Sex: Nov 22, 1932 (82 y.o. Female) Treating [name] Primary Care Physician/Extender: [name] Physician: Referring Physician: Jenelle Hahn in Treatment: 6 Information Obtained from: Patient Chief Complaint 03/07/16 patient is here for review of a draining surgical abscess site on the right buttock Electronic Signature(s) Signed: 03/21/2016 6:13:30 PM By: Elizabeth Ham MD Entered By: [name] on 03/21/2016 12:58:53 [name], [name] -------------------------------------------------------------------------------- HPI Details [name] Date of Service: 03/21/2016 10:45 AM Patient Name: G. Patient Account Number: [id number] Medical Record Treating RN: Montey Hora [id number] Number: Other Clinician: Date of Birth/Sex: Dec 28, 1932 (82 y.o. Female) Treating [name] Primary Care Physician/Extender: [name] Physician: Referring Physician: Jenelle Hahn in Treatment: 6 History of Present Illness HPI Description: 02/07/16; this is a pleasant and functional 82 year old woman who is here for our review of a draining sinus from and abscess site on her right buttock. The history is that she noted a swelling in March that she could feel herself however there was no pain. Ultimately she was referred by her primary doctor to a general surgeon [name]. She underwent a drain placement on 4/20. The drain was removed on 5/11. I think she had an IandD on 5/26 with a second drain removed on 6/26. At that point the area had actually closed over however reopened on 7/4 with what the patient describes as a sudden  onset of pain and then she felt that the drainage. 2 cultures of this area did not culture anything either aerobically or anaerobically. There was a culture on 7/5 that showed a bacillus species. I don't think this was specifically targeted with antibiotics. He did receive a 10 day course of oral antibiotics at some point in this. She was also recently referred to a neurosurgeon in [place] [name]. The issue here was that the patient has had multiple lower back surgeries and I think her general surgeon was concerned that this could be a source of infection for the abscess in the right buttock area. Her last CT scan was on 5/1. At that point she had a drain in place. She was noted to have a residual fluid collection measuring 2.2 x 1.5 in the right gluteal muscles. She was referred here by [name] who is her primary physician. I note that she is also referred her to infectious disease which seems reasonable. The patient states that she is not been systemically unwell no fever no chills. She continues to be active. Area is not painful and really never has been except for the recent reopening in early July. 02/14/16; the culture I did of this area last week was negative. The area measures 3.3 cm in depth we have been using silver alginate rope packing 02/21/16; as noted above the patient went to see [name]. A CT scan of her abdomen and pelvis was ordered. This showed a 1.6 x 4.6 x 4.5 fluid correction at the previous site of her drain. There is also an adjacent fluid collection on the other side of the iliac crest during 4.9 x 1 point 4 x 4 0.4 whether there was connection with spinal hardware was unclear based on the CT scan. Apparently an MRI has been ordered. When the patient was at Dr. Blane Ohara office today fluid just poured  out of this. His ofice put a plain dressing over the top by the time she got here the dressing was saturated. 03/07/16 patient continues to be  followed by [name]. She currently is being managed regard to his right hip abscess with vancomycin and Zosyn through a PICC line. Shee tells me that she is having a lot less drainage at this point in time and is pleased with the care that she is receiving currently. Fortunately she has no other worsening symptoms no signs of systemic infection and tells me that she is not having any pain and in fact really hasn't throughout the course of this. 03/21/16; is a patient I have not really seen in quite a while. She however is is still receiving IV vancomycin and Zosyn through a PICC line at the suggestion of [name]. Fortunately the drainage and the open area on her right lateral buttock has totally closed over. The patient is concerned that this will reopen at [facility]. ([id number]) some point and I generally sure that concern but for now everything is closed over and she does not require any specific dressing. He went on to have an MRI which appears not to have added much to the CT scan. As mentioned the areas closed and no specific dressing is required Electronic Signature(s) Signed: 03/21/2016 6:13:30 PM By: Elizabeth Ham MD Entered By: [name] on 03/21/2016 13:00:54 [name], [name] ([id number]) -------------------------------------------------------------------------------- Physical Exam Details [name] Date of Service: 03/21/2016 10:45 AM Patient Name: G. Patient Account Number: [id number] Medical Record Treating RN: Montey Hora [id number] Number: Other Clinician: Date of Birth/Sex: 06-30-1932 (82 y.o. Female) Treating [name] Primary Care Physician/Extender: [name] Physician: Referring Physician: Jenelle Hahn in Treatment: 6 Constitutional Patient is hypertensive.. Pulse regular and within target range for patient.[name] Respirations regular, non-labored and within target range.. Temperature is  normal and within the target range for the patient.. Patient's appearance is neat and clean. Appears in no acute distress. Well nourished and well developed.. Notes Wound exam; the patient's area on the upper right lateral gluteal is totally closed over. There is no surrounding erythema and no tenderness and no drainage. This is quite M major improvement Electronic Signature(s) Signed: 03/21/2016 6:13:30 PM By: Elizabeth Ham MD Entered By: [name] on 03/21/2016 13:01:41 [name], [name] ([id number]) -------------------------------------------------------------------------------- Physician Orders Details [name] Date of Service: 03/21/2016 10:45 AM Patient Name: G. Patient Account Number: [id number] Medical Record Treating RN: Montey Hora [id number] Number: Other Clinician: Date of Birth/Sex: 1932/10/26 (82 y.o. Female) Treating [name] Primary Care Physician/Extender: [name] Physician: Referring Physician: Jenelle Hahn in Treatment: 6 Verbal / Phone Orders: Yes Clinician: Montey Hora Read Back and Verified: Yes Diagnosis Coding Discharge From [facility] o Discharge from [place] Signature(s) Signed: 03/21/2016 5:37:04 PM By: Montey Hora Signed: 03/21/2016 6:13:30 PM By: Elizabeth Ham MD Entered By: Montey Hora on 03/21/2016 11:00:27 [name], [name] ([id number]) -------------------------------------------------------------------------------- Problem List Details [name] Date of Service: 03/21/2016 10:45 AM Patient Name: G. Patient Account Number: [id number] Medical Record Treating RN: Montey Hora [id number] Number: Other Clinician: Date of Birth/Sex: [birth year] (82 y.o. Female) Treating [name] Primary Care Physician/Extender: [name] Physician: Referring Physician: Jenelle Hahn in Treatment: 6 Active  Problems ICD-10 Encounter Code Description Active Date Diagnosis L02.31 Cutaneous abscess of buttock 02/07/2016 Yes T81.31XD Disruption of external operation (surgical) wound, not 02/07/2016 Yes elsewhere classified, subsequent encounter Inactive Problems Resolved Problems Electronic Signature(s) Signed: 03/21/2016  6:13:30 PM By: Elizabeth Ham MD Entered By: [name] on 03/21/2016 12:58:34 [name], [name] -------------------------------------------------------------------------------- Progress Note Details [name] Date of Service: 03/21/2016 10:45 AM Patient Name: G. Patient Account Number: [id number] Medical Record Treating RN: Montey Hora [id number] Number: Other Clinician: Date of Birth/Sex: 10-27-1932 (82 y.o. Female) Treating [name] Primary Care Physician/Extender: [name] Physician: Referring Physician: Jenelle Hahn in Treatment: 6 Subjective Chief Complaint Information obtained from Patient 03/07/16 patient is here for review of a draining surgical abscess site on the right buttock History of Present Illness (HPI) 02/07/16; this is a pleasant and functional 82 year old woman who is here for our review of a draining sinus from and abscess site on her right buttock. The history is that she noted a swelling in March that she could feel herself however there was no pain. Ultimately she was referred by her primary doctor to a general surgeon [name]. She underwent a drain placement on 4/20. The drain was removed on 5/11. I think she had an IandD on 5/26 with a second drain removed on 6/26. At that point the area had actually closed over however reopened on 7/4 with what the patient describes as a sudden onset of pain and then she felt that the drainage. 2 cultures of this area did not culture anything either aerobically or anaerobically. There was a culture on 7/5 that showed a bacillus species. I  don't think this was specifically targeted with antibiotics. He did receive a 10 day course of oral antibiotics at some point in this. She was also recently referred to a neurosurgeon in [place] [name]. The issue here was that the patient has had multiple lower back surgeries and I think her general surgeon was concerned that this could be a source of infection for the abscess in the right buttock area. Her last CT scan was on 5/1. At that point she had a drain in place. She was noted to have a residual fluid collection measuring 2.2 x 1.5 in the right gluteal muscles. She was referred here by [name] who is her primary physician. I note that she is also referred her to infectious disease which seems reasonable. The patient states that she is not been systemically unwell no fever no chills. She continues to be active. Area is not painful and really never has been except for the recent reopening in early July. 02/14/16; the culture I did of this area last week was negative. The area measures 3.3 cm in depth we have been using silver alginate rope packing 02/21/16; as noted above the patient went to see [name]. A CT scan of her abdomen and pelvis was ordered. This showed a 1.6 x 4.6 x 4.5 fluid correction at the previous site of her drain. There is also an adjacent fluid collection on the other side of the iliac crest during 4.9 x 1 point 4 x 4 0.4 whether there was connection with spinal hardware was unclear based on the CT scan. Apparently an MRI has been ordered. When the patient was at Dr. Blane Ohara office today fluid just poured out of this. His ofice put a plain dressing over the top by the time she got here the dressing was saturated. 03/07/16 patient continues to be followed by [name]. She currently is being managed regard to his right hip abscess with vancomycin and Zosyn through a PICC line. Shee tells me that she is having a lot [name], [name]. ([id number]) less  drainage at this point in time and is pleased with the care that she is receiving currently. Fortunately she has no other worsening symptoms no signs of systemic infection and tells me that she is not having any pain and in fact really hasn't throughout the course of this. 03/21/16; is a patient I have not really seen in quite a while. She however is is still receiving IV vancomycin and Zosyn through a PICC line at the suggestion of [name]. Fortunately the drainage and the open area on her right lateral buttock has totally closed over. The patient is concerned that this will reopen at some point and I generally sure that concern but for now everything is closed over and she does not require any specific dressing. He went on to have an MRI which appears not to have added much to the CT scan. As mentioned the areas closed and no specific dressing is required Objective Constitutional Patient is hypertensive.. Pulse regular and within target range for patient.[name] Respirations regular, non-labored and within target range.. Temperature is normal and within the target range for the patient.. Patient's appearance is neat and clean. Appears in no acute distress. Well nourished and well developed.. Vitals Time Taken: 10:43 AM, Height: 61 in, Weight: 144 lbs, BMI: 27.2, Temperature: 98.2 F, Pulse: 55 bpm, Respiratory Rate: 16 breaths/min, Blood Pressure: 149/50 mmHg. General Notes: Wound exam; the patient's area on the upper right lateral gluteal is totally closed over. There is no surrounding erythema and no tenderness and no drainage. This is quite M major improvement Integumentary (Hair, Skin) Wound #1 status is Open. Original cause of wound was Surgical Injury. The wound is located on the Right Ilium. The wound measures 0cm length x 0cm width x 0cm depth; 0cm^2 area and 0cm^3 volume. The wound is limited to skin breakdown. There is no tunneling or undermining noted. There  is a large amount of purulent drainage noted. The wound margin is indistinct and nonvisible. There is no granulation within the wound bed. There is no necrotic tissue within the wound bed. The periwound skin appearance exhibited: Induration, Localized Edema, Moist. The periwound skin appearance did not exhibit: Callus, Crepitus, Excoriation, Fluctuance, Friable, Rash, Scarring, Dry/Scaly, Maceration, Atrophie Blanche, Cyanosis, Ecchymosis, Hemosiderin Staining, Mottled, Pallor, Rubor, Erythema. Periwound temperature was noted as No Abnormality. Assessment Active Problems ICD-10 L02.31 - Cutaneous abscess of buttock [name], [name] ([id number]) T81.31XD - Disruption of external operation (surgical) wound, not elsewhere classified, subsequent encounter Plan Discharge From [facility] Services: Discharge from [place] #1 think the patient can be discharged from the wound care center #2 still has 2 weeks of IV antibiotics to go, it'll be interesting to see if the area stays together after stopping the vancomycin and Zosyn. Electronic Signature(s) Signed: 03/21/2016 6:13:30 PM By: Elizabeth Ham MD Entered By: [name] on 03/21/2016 13:02:16 [name], [name] ([id number]) -------------------------------------------------------------------------------- SuperBill Details [name] Date of Service: 03/21/2016 Patient Name: G. Patient Account Number: [id number] Medical Record Treating RN: Montey Hora [id number] Number: Other Clinician: Date of Birth/Sex: Mar 04, 1933 (82 y.o. Female) Treating [name] Primary Care Physician/Extender: [name] Physician: Suella Grove in Treatment: 6 Referring Physician: [name] Diagnosis Coding ICD-10 Codes Code Description L02.31 Cutaneous abscess of buttock Disruption of external operation (surgical) wound, not elsewhere classified, subsequent T81.31XD encounter Facility Procedures CPT4 Code:  [id number] Description: [phone] - WOUND CARE VISIT-LEV 2 EST PT Modifier: Quantity: 1 Physician Procedures CPT4 Code: [id number] Description: [id number] - WC PHYS LEVEL 2 - EST PT ICD-10  Description Diagnosis L02.31 Cutaneous abscess of buttock Modifier: Quantity: 1 Electronic Signature(s) Signed: 03/21/2016 6:13:30 PM By: Elizabeth Ham MD Entered By: [name] on 03/21/2016 13:02:45

## 2016-03-22 NOTE — Progress Notes (Signed)
[name], [name] ([id number]) Visit Report for 03/21/2016 Arrival Information Details ANADALAY, PELAN Date of Service: 03/21/2016 10:45 AM Patient Name: G. Patient Account Number: [id number] Medical Record Treating RN: Montey Hora [id number] Number: Other Clinician: Date of Birth/Sex: 1933/01/26 (82 y.o. Female) Treating Dellia Nims, [name] Primary Care Physician/Extender: Marlou Starks Physician: Referring Physician: Jenelle Mages in Treatment: 6 Visit Information History Since Last Visit Added or deleted any medications: No Patient Arrived: Ambulatory Any new allergies or adverse reactions: No Arrival Time: 10:42 Had a fall or experienced change in No Accompanied By: self activities of daily living that may affect Transfer Assistance: None risk of falls: Patient Identification Verified: Yes Signs or symptoms of abuse/neglect since last No Secondary Verification Process Yes visito Completed: Hospitalized since last visit: No Patient Has Alerts: Yes Pain Present Now: No Electronic Signature(s) Signed: 03/21/2016 5:37:04 PM By: Montey Hora Entered By: Montey Hora on 03/21/2016 10:42:55 Grider, [name] ([id number]) -------------------------------------------------------------------------------- Clinic Level of Care Assessment Details [name] Date of Service: 03/21/2016 10:45 AM Patient Name: G. Patient Account Number: [id number] Medical Record Treating RN: Montey Hora [id number] Number: Other Clinician: Date of Birth/Sex: Aug 07, 1932 (82 y.o. Female) Treating [name], [name] Primary Care Physician/Extender: Marlou Starks Physician: Referring Physician: Jenelle Mages in Treatment: 6 Clinic Level of Care Assessment Items TOOL 4 Quantity Score []  - Use when only an EandM is performed on FOLLOW-UP visit 0 ASSESSMENTS - Nursing Assessment / Reassessment X - Reassessment of Co-morbidities (includes  updates in patient status) 1 10 X - Reassessment of Adherence to Treatment Plan 1 5 ASSESSMENTS - Wound and Skin Assessment / Reassessment X - Simple Wound Assessment / Reassessment - one wound 1 5 []  - Complex Wound Assessment / Reassessment - multiple wounds 0 []  - Dermatologic / Skin Assessment (not related to wound area) 0 ASSESSMENTS - Focused Assessment []  - Circumferential Edema Measurements - multi extremities 0 []  - Nutritional Assessment / Counseling / Intervention 0 []  - Lower Extremity Assessment (monofilament, tuning fork, pulses) 0 []  - Peripheral Arterial Disease Assessment (using hand held doppler) 0 ASSESSMENTS - Ostomy and/or Continence Assessment and Care []  - Incontinence Assessment and Management 0 []  - Ostomy Care Assessment and Management (repouching, etc.) 0 PROCESS - Coordination of Care X - Simple Patient / Family Education for ongoing care 1 15 []  - Complex (extensive) Patient / Family Education for ongoing care 0 []  - Staff obtains Consents, Records, Test Results / Process Orders 0 Barbero, [name] ([id number]) []  - Staff telephones HHA, Nursing Homes / Clarify orders / etc 0 []  - Routine Transfer to another Facility (non-emergent condition) 0 []  - Routine Hospital Admission (non-emergent condition) 0 []  - New Admissions / Biomedical engineer / Ordering NPWT, Apligraf, etc. 0 []  - Emergency Hospital Admission (emergent condition) 0 X - Simple Discharge Coordination 1 10 []  - Complex (extensive) Discharge Coordination 0 PROCESS - Special Needs []  - Pediatric / Minor Patient Management 0 []  - Isolation Patient Management 0 []  - Hearing / Language / Visual special needs 0 []  - Assessment of Community assistance (transportation, D/C planning, etc.) 0 []  - Additional assistance / Altered mentation 0 []  - Support Surface(s) Assessment (bed, cushion, seat, etc.) 0 INTERVENTIONS - Wound Cleansing / Measurement X - Simple Wound Cleansing - one wound 1  5 []  - Complex Wound Cleansing - multiple wounds 0 X - Wound Imaging (photographs - any number of wounds) 1 5 []  - Wound Tracing (instead of photographs) 0 X - Simple Wound Measurement -  one wound 1 5 []  - Complex Wound Measurement - multiple wounds 0 INTERVENTIONS - Wound Dressings []  - Small Wound Dressing one or multiple wounds 0 []  - Medium Wound Dressing one or multiple wounds 0 []  - Large Wound Dressing one or multiple wounds 0 []  - Application of Medications - topical 0 []  - Application of Medications - injection 0 Bergeman, Ranesha G. ([id number]) INTERVENTIONS - Miscellaneous []  - External ear exam 0 []  - Specimen Collection (cultures, biopsies, blood, body fluids, etc.) 0 []  - Specimen(s) / Culture(s) sent or taken to Lab for analysis 0 []  - Patient Transfer (multiple staff / Harrel Lemon Lift / Similar devices) 0 []  - Simple Staple / Suture removal (25 or less) 0 []  - Complex Staple / Suture removal (26 or more) 0 []  - Hypo / Hyperglycemic Management (close monitor of Blood Glucose) 0 []  - Ankle / Brachial Index (ABI) - do not check if billed separately 0 X - Vital Signs 1 5 [name]: Yes Total Score: 65 Level Of Care: New/Established - Level 2 Electronic Signature(s) Signed: 03/21/2016 5:37:04 PM By: Montey Hora Entered By: Montey Hora on 03/21/2016 11:09:58 [place], [name] ([id number]) -------------------------------------------------------------------------------- Encounter Discharge Information Details [name] Date of Service: 03/21/2016 10:45 AM Patient Name: G. Patient Account Number: [id number] Medical Record Treating RN: Montey Hora [id number] Number: Other Clinician: Date of Birth/Sex: [birth year] (82 y.o. Female) Treating Dellia Nims, [name] Primary Care Physician/Extender: Marlou Starks Physician: Referring Physician: Jenelle Mages in Treatment: 6 Encounter  Discharge Information Items Discharge Pain Level: 0 Discharge Condition: Stable Ambulatory Status: Ambulatory Discharge Destination: Home Transportation: Private Auto Accompanied By: self Schedule Follow-up Appointment: No Medication Reconciliation completed and provided to Patient/Care No [place] Coco: Provided on Clinical Summary of Care: 03/21/2016 Form Type Recipient Paper Patient SM Electronic Signature(s) Signed: 03/21/2016 11:11:08 AM By: Montey Hora Previous Signature: 03/21/2016 11:03:52 AM Version By: Ruthine Dose Entered By: Montey Hora on 03/21/2016 11:11:07 Hamblin, [name] ([id number]) -------------------------------------------------------------------------------- Multi-Disciplinary Care Plan Details [name] Date of Service: 03/21/2016 10:45 AM Patient Name: G. Patient Account Number: [id number] Medical Record Treating RN: Montey Hora [id number] Number: Other Clinician: Date of Birth/Sex: 12-04-1932 (82 y.o. Female) Treating [name], [name] Primary Care Physician/Extender: Marlou Starks Physician: Referring Physician: Jenelle Mages in Treatment: 6 Active Inactive Electronic Signature(s) Signed: 03/21/2016 11:10:30 AM By: Montey Hora Entered By: Montey Hora on 03/21/2016 11:10:29 [place], [name] ([id number]) -------------------------------------------------------------------------------- Pain Assessment Details [name] Date of Service: 03/21/2016 10:45 AM Patient Name: G. Patient Account Number: [id number] Medical Record Treating RN: Montey Hora [id number] Number: Other Clinician: Date of Birth/Sex: May 15, 1933 (82 y.o. Female) Treating Dellia Nims, [name] Primary Care Physician/Extender: Marlou Starks Physician: Referring Physician: Jenelle Mages in Treatment: 6 Active Problems Location of Pain Severity and Description of Pain Patient Has Paino No Site Locations Pain  Management and Medication Current Pain Management: Notes Topical or injectable lidocaine is offered to patient for acute pain when surgical debridement is performed. If needed, Patient is instructed to use over the counter pain medication for the following 24-48 hours after debridement. Wound care MDs do not prescribed pain medications. Patient has chronic pain or uncontrolled pain. Patient has been instructed to make an appointment with their Primary Care Physician for pain management. Electronic Signature(s) Signed: 03/21/2016 5:37:04 PM By: Montey Hora Entered By: Montey Hora on 03/21/2016 10:43:33 [place], [name] ([id number]) -------------------------------------------------------------------------------- Patient/Caregiver Education Details [name] Date of Service: 03/21/2016 10:45 AM  Patient Name: G. Patient Account Number: [id number] Medical Record Treating RN: Montey Hora [id number] Number: Other Clinician: Date of Birth/Gender: Mar 01, 1933 (82 y.o. Female) Treating Dellia Nims, [name] Primary Care Physician/Extender: Marlou Starks Physician: Suella [place] in Treatment: 6 Referring Physician: [name] Education Assessment Education Provided To: Patient Education Topics Provided Basic Hygiene: Handouts: Other: skin care of newly healed ulcer site Methods: Explain/Verbal Responses: State content correctly Electronic Signature(s) Signed: 03/21/2016 5:37:04 PM By: Montey Hora Entered By: Montey Hora on 03/21/2016 11:12:26 Flavell, [name] ([id number]) -------------------------------------------------------------------------------- Wound Assessment Details [name] Date of Service: 03/21/2016 10:45 AM Patient Name: G. Patient Account Number: [id number] Medical Record Treating RN: Montey Hora [id number] Number: Other Clinician: Date of Birth/Sex: Nov 06, 1932 (83 y.o. Female) Treating [name], [name] Primary Care  Physician/Extender: Marlou Starks Physician: Referring Physician: Jenelle Mages in Treatment: 6 Wound Status Wound Number: 1 Primary Abscess Etiology: Wound Location: Right [place] Wound Open Wounding Event: Surgical Injury Status: Date Acquired: 11/29/2015 Comorbid Anemia, Hypertension, Osteomyelitis, Weeks Of Treatment: 6 History: Received Chemotherapy Clustered Wound: No Photos Wound Measurements Length: (cm) 0 % Reduction in Width: (cm) 0 % Reduction in Depth: (cm) 0 Epithelializati Area: (cm) 0 Tunneling: Volume: (cm) 0 Undermining: Area: 100% Volume: 100% on: Large (67-100%) No No Wound Description Full Thickness Without Exposed Classification: Support Structures Wound Margin: Indistinct, nonvisible Exudate Large Amount: Exudate Type: Purulent Exudate Color: yellow, brown, green Foul Odor After Cleansing: No Wound Bed Granulation Amount: None Present (0%) Exposed Structure JIM, BECKSTROM ([id number]) Necrotic Amount: None Present (0%) Fascia Exposed: No Fat Layer Exposed: No Tendon Exposed: No Muscle Exposed: No Joint Exposed: No Bone Exposed: No Limited to Skin Breakdown Periwound Skin Texture Texture Color No Abnormalities Noted: No No Abnormalities Noted: No Callus: No Atrophie Blanche: No Crepitus: No Cyanosis: No Excoriation: No Ecchymosis: No Fluctuance: No Erythema: No Friable: No Hemosiderin Staining: No Induration: Yes Mottled: No Localized Edema: Yes Pallor: No Rash: No Rubor: No Scarring: No Temperature / Pain Moisture Temperature: No Abnormality No Abnormalities Noted: No Dry / Scaly: No Maceration: No Moist: Yes Wound Preparation Ulcer Cleansing: Rinsed/Irrigated with Saline Topical Anesthetic Applied: None Electronic Signature(s) Signed: 03/21/2016 5:37:04 PM By: Montey Hora Entered By: Montey Hora on 03/21/2016 10:49:07 Cisek, [name]  ([id number]) -------------------------------------------------------------------------------- Vitals Details [name] Date of Service: 03/21/2016 10:45 AM Patient Name: G. Patient Account Number: [id number] Medical Record Treating RN: Montey Hora [id number] Number: Other Clinician: Date of Birth/Sex: 12/24/1932 (82 y.o. Female) Treating [name], [name] Primary Care Physician/Extender: Marlou Starks Physician: Referring Physician: Jenelle Mages in Treatment: 6 Vital Signs Time Taken: 10:43 Temperature (F): 98.2 Height (in): 61 Pulse (bpm): 55 Weight (lbs): 144 Respiratory Rate (breaths/min): 16 Body Mass Index (BMI): 27.2 Blood Pressure (mmHg): 149/50 Reference Range: 80 - 120 mg / dl Electronic Signature(s) Signed: 03/21/2016 5:37:04 PM By: Montey Hora Entered By: Montey Hora on 03/21/2016 10:44:54

## 2016-03-23 ENCOUNTER — Ambulatory Visit: Payer: Medicare Other | Admitting: Urology

## 2016-03-27 ENCOUNTER — Other Ambulatory Visit: Payer: Self-pay | Admitting: Infectious Diseases

## 2016-03-27 DIAGNOSIS — K6812 Psoas muscle abscess: Secondary | ICD-10-CM

## 2016-03-27 DIAGNOSIS — Z792 Long term (current) use of antibiotics: Secondary | ICD-10-CM

## 2016-03-27 DIAGNOSIS — L0231 Cutaneous abscess of buttock: Secondary | ICD-10-CM

## 2016-04-02 ENCOUNTER — Ambulatory Visit
Admission: RE | Admit: 2016-04-02 | Discharge: 2016-04-02 | Disposition: A | Discharge status: Home / Self Care | Payer: Medicare Other | Source: Ambulatory Visit | Attending: Infectious Diseases | Admitting: Infectious Diseases

## 2016-04-02 DIAGNOSIS — K6812 Psoas muscle abscess: Secondary | ICD-10-CM | POA: Insufficient documentation

## 2016-04-02 DIAGNOSIS — L0231 Cutaneous abscess of buttock: Secondary | ICD-10-CM

## 2016-04-02 DIAGNOSIS — Z792 Long term (current) use of antibiotics: Secondary | ICD-10-CM

## 2016-04-02 DIAGNOSIS — Z452 Encounter for adjustment and management of vascular access device: Secondary | ICD-10-CM | POA: Diagnosis not present

## 2016-04-02 LAB — POCT I-STAT CREATININE: Creatinine, Ser: 1 mg/dL (ref 0.44–1.00)

## 2016-04-02 MED ORDER — IOPAMIDOL (ISOVUE-300) INJECTION 61%
75.0000 mL | Freq: Once | INTRAVENOUS | Status: AC | PRN
  Administered 2016-04-02: 75 mL via INTRAVENOUS

## 2016-04-02 MED ORDER — HYDROCOD POLST-CPM POLST ER 10-8 MG/5ML PO SUER
5.0000 mL | Freq: Once | ORAL | Status: AC
  Administered 2016-04-02: 5 mL via ORAL
  Filled 2016-04-02: qty 5

## 2016-04-02 NOTE — OR Nursing (Signed)
Pt notified on arrival that call received to preop indicates PICC line team will not be here until 3 pm.  Pt. C/O cough.  Dr. Ola Spurr called.  Tussionex 5 ml given as ordered. Dr. Ola Spurr advises he will contact radiology for CT ABD results and that possibility pt will not need PICC line.  Advises he will call us back.

## 2016-04-02 NOTE — OR Nursing (Signed)
Dr. Ola Spurr in to see pt, told her PICC line no longer needed and he d/c'd PICC line in pt room.  MD notified here to start her oral antibiotics, advises she will.

## 2016-04-12 ENCOUNTER — Ambulatory Visit: Payer: Medicare Other | Admitting: Urology

## 2016-04-12 VITALS — BP 179/64 | HR 55 | Ht 61.0 in | Wt 136.0 lb

## 2016-04-12 DIAGNOSIS — C679 Malignant neoplasm of bladder, unspecified: Secondary | ICD-10-CM | POA: Diagnosis not present

## 2016-04-12 DIAGNOSIS — R590 Localized enlarged lymph nodes: Secondary | ICD-10-CM

## 2016-04-12 DIAGNOSIS — L0231 Cutaneous abscess of buttock: Secondary | ICD-10-CM

## 2016-04-12 LAB — URINALYSIS, COMPLETE
Bilirubin, UA: NEGATIVE
GLUCOSE, UA: NEGATIVE
KETONES UA: NEGATIVE
Leukocytes, UA: NEGATIVE
NITRITE UA: NEGATIVE
SPEC GRAV UA: 1.02 (ref 1.005–1.030)
UUROB: 0.2 mg/dL (ref 0.2–1.0)
pH, UA: 6 (ref 5.0–7.5)

## 2016-04-12 LAB — MICROSCOPIC EXAMINATION: Bacteria, UA: NONE SEEN

## 2016-04-12 MED ORDER — LIDOCAINE HCL 2 % EX GEL
1.0000 "application " | Freq: Once | CUTANEOUS | Status: AC
  Administered 2016-04-12: 1 via URETHRAL

## 2016-04-12 NOTE — Progress Notes (Signed)
10:07 AM  04/12/16   [name] 1932/07/21 [id number]  Referring provider: Kirk Ruths, MD [facility] [place], [place] [address]  Chief Complaint  Patient presents with  . Bladder Cancer    discuss treatment    HPI: 82 year old nonsmoker with  incidental 11 mm papillary neoplasm near the RIGHT bladder neck. She was taken to the operating room on 09/29/2014 for TURBT, Bilateral retrograde pyelogramat which time a 2 cm extremely spherical mass was identified just proximal to the RIGHT UO. This was resected along with deeper biopsies of the base. Of note, bilateral retrograde pyelogram was negative for any obvious upper tract filling defects or hydronephrosis.   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 The tumor had an inverted architecture. Deeper biopsies did contain muscle after speaking with the pathologist today and the muscle was negative for any tumor. Additonally, CIS was noted.  She returned to the operating room on 11/02/2014 for restaging TURBT. Pathology showed no evidence of residual tumor.  She completed BCG x 6 for induction and first course of maintenence bcg x 3 doses  X 2.  Most recent maintainance dose completed in 07/2015.     Following her last course of BCG,  she issues with her gluteal abscess which has progressed tracking to her psoas and quadratus muscle and questionable involvement of her spinal hardware. She has completed long term IV abx by Dr. Ola Spurr with PICC line and now being treated for UTI.  She is undergone 2 surgical drainage's at this time. AFB cultures negative.  Most recent imaging in the form of CT abdomen and pelvis on 04/02/2016 shows residual soft tissue tract overlying the right gluteus tracking to the right so as without unmeasurable fluid collection. She is also been diagnosed with a right middle and lower lobe pneumonia and currently on  oral antibiotics.  She returns today for surveillance cystoscopy. She has no urinary complaints today.  Overall, she seems to be doing better than on previous occasions.   PMH: Past Medical History:  Diagnosis Date  . Anemia   . Cancer [facility]) 2016   bladder tumor  . Carotid arterial disease ([place]) 01/09/2014   Overview:  Minimal on screening   . Chronic low back pain   . Colon polyp   . DDD (degenerative disc disease), cervical   . DDD (degenerative disc disease), lumbar   . Elevated lipids   . Foot pain   . Glaucoma   . Hypertension   . Multiple gastric ulcers   . Osteoporosis     Surgical History: Past Surgical History:  Procedure Laterality Date  . ABDOMINAL HYSTERECTOMY  1973  . BACK SURGERY     03/1975-ruptured disk, 11/1983-ruptured disk, 11/1984-ruptured disk, 10/1996-ruptured disk, 08/1997-spinal fusion, 02/10/07-spinal fusion [facility]  . CARPAL TUNNEL RELEASE Right   . CATARACT EXTRACTION Bilateral   . CYSTOSCOPY W/ RETROGRADES Bilateral 09/29/2014   Procedure: CYSTOSCOPY WITH RETROGRADE PYELOGRAM;  Surgeon: Hollice Espy, MD;  Location: [facility] ORS;  Service: Urology;  Laterality: Bilateral;  . CYSTOSCOPY WITH BIOPSY N/A 11/02/2014   Procedure: CYSTOSCOPY WITH BIOPSY/WITH MITOMYCIN;  Surgeon: Hollice Espy, MD;  Location: [facility] ORS;  Service: Urology;  Laterality: N/A;  . ESOPHAGOGASTRODUODENOSCOPY (EGD) WITH PROPOFOL N/A 05/10/2015   Procedure: ESOPHAGOGASTRODUODENOSCOPY (EGD) WITH PROPOFOL;  Surgeon: Lollie Sails, MD;  Location[facility]COPY;  Service: Endoscopy;  Laterality: N/A;  . EYE SURGERY    . FOOT SURGERY Right   .  IRRIGATION AND DEBRIDEMENT BUTTOCKS Right 10/21/2015   Procedure: DRAINAGE GLUTEAL ABSCESS;  Surgeon: Christene Lye, MD;  Location: [facility] ORS;  Service: General;  Laterality: Right;  . KNEE ARTHROSCOPY Right    x2  . MANDIBLE SURGERY Bilateral    x2  . SHOULDER ARTHROSCOPY WITH ROTATOR CUFF REPAIR AND SUBACROMIAL DECOMPRESSION Right  05/25/2015   Procedure: SHOULDER ARTHROSCOPY WITH ROTATOR CUFF REPAIR AND SUBACROMIAL DECOMPRESSION, release long head biceps tendon;  Surgeon: Leanor Kail, MD;  Location: [facility] ORS;  Service: Orthopedics;  Laterality: Right;  . TRANSURETHRAL RESECTION OF BLADDER TUMOR N/A 09/29/2014   Procedure: TRANSURETHRAL RESECTION OF BLADDER TUMOR (TURBT);  Surgeon: Hollice Espy, MD;  Location: [facility] ORS;  Service: Urology;  Laterality: N/A;  . WRIST FRACTURE SURGERY Left     Home Medications:    Medication List       Accurate as of 04/12/16 10:07 AM. Always use your most recent med list.          carvedilol 25 MG tablet Commonly known as:  COREG Take 25 mg by mouth 2 (two) times daily with a meal.   ciprofloxacin 500 MG tablet Commonly known as:  CIPRO Take 500 mg by mouth 2 (two) times daily.   diltiazem 240 MG 24 hr capsule Commonly known as:  CARDIZEM CD Take 240 mg by mouth daily.   latanoprost 0.005 % ophthalmic solution Commonly known as:  XALATAN Place 1 drop into both eyes at bedtime.   pravastatin 40 MG tablet Commonly known as:  PRAVACHOL Take 40 mg by mouth daily at 6 PM.   raloxifene 60 MG tablet Commonly known as:  EVISTA Take 60 mg by mouth daily.   torsemide 20 MG tablet Commonly known as:  DEMADEX Take 20 mg by mouth as needed.       Allergies:  No Known Allergies  Family History: Family History  Problem Relation Age of Onset  . Heart attack Father   . Emphysema Father   . Heart disease Mother   . Hypertension Mother   . Diabetes type II Sister   . Kidney disease Neg Hx   . Bladder Cancer Neg Hx   . Breast cancer Neg Hx     Social History:  reports that she has never smoked. She has never used smokeless tobacco. She reports that she does not drink alcohol or use drugs.   Physical Exam: BP (!) 179/64   Pulse (!) 55   Ht 5\' 1"  (1.549 m)   Wt 136 lb (61.7 kg)   BMI 25.70 kg/m   Constitutional:  Alert and oriented, No acute  distress. HEENT: [place] AT, moist mucus membranes.  Trachea midline, no masses. Cardiovascular: No clubbing, cyanosis, or edema. Respiratory: Normal respiratory effort, no increased work of breathing. GI: Abdomen is soft, nontender, nondistended, no abdominal masses  GU:  Normal external genitalia. Normal urethral meatus. Skin: No rashes, bruises or suspicious lesions. Neurologic: Grossly intact, no focal deficits, moving all 4 extremities. Psychiatric: Normal mood and affect.  Laboratory Data: Lab Results  Component Value Date   WBC 5.7 03/15/2016   HGB 9.0 (L) 03/15/2016   HCT 26.8 (L) 03/15/2016   MCV 85.4 03/15/2016   PLT 259 03/15/2016    Urinalysis UA reviewed, no evidence of infection  Pertinent Imaging: Recent CT scan and MRI results from 01/3016 reviewed Progression of gluteal/ psoas abscess   Cystoscopy Procedure Note  Patient identification was confirmed, informed consent was obtained, and patient was prepped using Betadine solution.  Lidocaine jelly was administered per urethral meatus.    Preoperative abx where received prior to procedure.    Procedure: - Flexible cystoscope introduced, without any difficulty.   - Thorough search of the bladder revealed:    normal urethral meatus    normal urothelium    no stones    no ulcers     no tumors    no urethral polyps    no trabeculation  - Ureteral orifices were normal in position and appearance.  Post-Procedure: - Patient tolerated the procedure well  Assessment & Plan:  82 year old female with a history of high-grade T1, Tis of the right bladder neck dx 09/2014 followed by induction BCG x 6, and most recently  maintenance BCG completed 04/2015 and 07/2015.  She returns today for surveillance cystoscopy.   1. Malignant neoplasm of bladder neck Cystoscopy today negative.  Recommend q3 month surveillance cystoscopy. Cytology sent today. Previously due for BCG maintenance due in 01/2016 following this cystoscopy -  discussed with Dr. Ola Spurr from ID re: whether to procede in setting of worsening abcess, etc.   AFB culture negative.   In setting of worsening infection of unclear etiology, further BCG held at this time  2. Gluteal abscess As above  - Urinalysis, Complete   Return in about 3 months (around 07/13/2016) for cysto.     Hollice Espy, MD  [facility] [address] [place], [place] [address] [phone]

## 2016-04-17 ENCOUNTER — Other Ambulatory Visit: Payer: Self-pay | Admitting: Infectious Diseases

## 2016-04-17 DIAGNOSIS — C679 Malignant neoplasm of bladder, unspecified: Secondary | ICD-10-CM

## 2016-04-17 DIAGNOSIS — R609 Edema, unspecified: Secondary | ICD-10-CM

## 2016-04-17 DIAGNOSIS — L0231 Cutaneous abscess of buttock: Secondary | ICD-10-CM

## 2016-04-23 ENCOUNTER — Other Ambulatory Visit: Payer: Self-pay | Admitting: Urology

## 2016-04-30 ENCOUNTER — Other Ambulatory Visit: Payer: Self-pay | Admitting: Infectious Diseases

## 2016-05-10 ENCOUNTER — Ambulatory Visit
Admission: RE | Admit: 2016-05-10 | Discharge: 2016-05-10 | Disposition: A | Discharge status: Home / Self Care | Payer: Medicare Other | Source: Ambulatory Visit | Attending: Infectious Diseases | Admitting: Infectious Diseases

## 2016-05-10 DIAGNOSIS — L0231 Cutaneous abscess of buttock: Secondary | ICD-10-CM | POA: Diagnosis present

## 2016-05-10 DIAGNOSIS — C679 Malignant neoplasm of bladder, unspecified: Secondary | ICD-10-CM | POA: Diagnosis not present

## 2016-05-10 DIAGNOSIS — R609 Edema, unspecified: Secondary | ICD-10-CM | POA: Diagnosis not present

## 2016-05-10 DIAGNOSIS — Z981 Arthrodesis status: Secondary | ICD-10-CM | POA: Insufficient documentation

## 2016-05-10 DIAGNOSIS — I700 Atherosclerosis of aorta: Secondary | ICD-10-CM | POA: Insufficient documentation

## 2016-05-10 DIAGNOSIS — Z9889 Other specified postprocedural states: Secondary | ICD-10-CM | POA: Insufficient documentation

## 2016-05-10 MED ORDER — IOPAMIDOL (ISOVUE-300) INJECTION 61%
75.0000 mL | Freq: Once | INTRAVENOUS | Status: AC | PRN
Start: 2016-05-10 — End: 2016-05-10
  Administered 2016-05-10: 75 mL via INTRAVENOUS

## 2016-06-01 ENCOUNTER — Other Ambulatory Visit: Payer: Medicare Other | Admitting: Urology

## 2016-06-11 DIAGNOSIS — D472 Monoclonal gammopathy: Secondary | ICD-10-CM | POA: Insufficient documentation

## 2016-06-11 NOTE — Progress Notes (Deleted)
[place]  Telephone:(336) [phone]  Fax:(336) [phone]     [name] DOB: [birth year]  MR#: [id number]  ERX#:[id number]  Patient Care Team: Kirk Ruths, MD as PCP - General (Internal Medicine) Hollice Espy, MD as Consulting Physician (Urology) Kirk Ruths, MD (Internal Medicine) Seeplaputhur Robinette Haines, MD (General Surgery)  CHIEF COMPLAINT:  Iron deficiency anemia, unspecified. MGUS.  INTERVAL HISTORY: Patient returns to clinic today for repeat laboratory work and routine evaluation. She was recently hospitalized for an infection which ultimately required a PICC line placement in 6 weeks of IV antibiotics. She currently is approximately 3 weeks into her treatment. She currently feels well. She denies any fevers. She has no neurologic complaints. She does not complain of any weakness or fatigue today. She has no chest pain or shortness of breath. She denies any nausea, vomiting, constipation, or diarrhea. She has noted no melena or hematochezia. She has no urinary complaints.   REVIEW OF SYSTEMS:   Review of Systems  Constitutional: Negative.  Negative for fever, malaise/fatigue and weight loss.  Respiratory: Negative.  Negative for cough and shortness of breath.   Cardiovascular: Negative.  Negative for chest pain and leg swelling.  Gastrointestinal: Negative.  Negative for abdominal pain, blood in stool and melena.  Musculoskeletal: Negative.   Neurological: Negative.  Negative for weakness.  Psychiatric/Behavioral: Negative.  The patient is not nervous/anxious.     As per HPI. Otherwise, a complete review of systems is negative.   PAST MEDICAL HISTORY: Past Medical History:  Diagnosis Date  . Anemia   . Cancer [facility]) 2016   bladder tumor  . Carotid arterial disease ([place]) 01/09/2014   Overview:  Minimal on screening   . Chronic low back pain   . Colon polyp   . DDD (degenerative disc disease), cervical   . DDD (degenerative disc  disease), lumbar   . Elevated lipids   . Foot pain   . Glaucoma   . Hypertension   . Multiple gastric ulcers   . Osteoporosis     PAST SURGICAL HISTORY: Past Surgical History:  Procedure Laterality Date  . ABDOMINAL HYSTERECTOMY  1973  . BACK SURGERY     03/1975-ruptured disk, 11/1983-ruptured disk, 11/1984-ruptured disk, 10/1996-ruptured disk, 08/1997-spinal fusion, 02/10/07-spinal fusion [facility]  . CARPAL TUNNEL RELEASE Right   . CATARACT EXTRACTION Bilateral   . CYSTOSCOPY W/ RETROGRADES Bilateral 09/29/2014   Procedure: CYSTOSCOPY WITH RETROGRADE PYELOGRAM;  Surgeon: Hollice Espy, MD;  Location: [facility] ORS;  Service: Urology;  Laterality: Bilateral;  . CYSTOSCOPY WITH BIOPSY N/A 11/02/2014   Procedure: CYSTOSCOPY WITH BIOPSY/WITH MITOMYCIN;  Surgeon: Hollice Espy, MD;  Location: [facility] ORS;  Service: Urology;  Laterality: N/A;  . ESOPHAGOGASTRODUODENOSCOPY (EGD) WITH PROPOFOL N/A 05/10/2015   Procedure: ESOPHAGOGASTRODUODENOSCOPY (EGD) WITH PROPOFOL;  Surgeon: Lollie Sails, MD;  Location: [facility] ENDOSCOPY;  Service: Endoscopy;  Laterality: N/A;  . EYE SURGERY    . FOOT SURGERY Right   . IRRIGATION AND DEBRIDEMENT BUTTOCKS Right 10/21/2015   Procedure: DRAINAGE GLUTEAL ABSCESS;  Surgeon: Christene Lye, MD;  Location: [facility] ORS;  Service: General;  Laterality: Right;  . KNEE ARTHROSCOPY Right    x2  . MANDIBLE SURGERY Bilateral    x2  . SHOULDER ARTHROSCOPY WITH ROTATOR CUFF REPAIR AND SUBACROMIAL DECOMPRESSION Right 05/25/2015   Procedure: SHOULDER ARTHROSCOPY WITH ROTATOR CUFF REPAIR AND SUBACROMIAL DECOMPRESSION, release long head biceps tendon;  Surgeon: Leanor Kail, MD;  Location: [facility] ORS;  Service: Orthopedics;  Laterality: Right;  . TRANSURETHRAL  RESECTION OF BLADDER TUMOR N/A 09/29/2014   Procedure: TRANSURETHRAL RESECTION OF BLADDER TUMOR (TURBT);  Surgeon: Hollice Espy, MD;  Location: [facility] ORS;  Service: Urology;  Laterality: N/A;  . WRIST FRACTURE SURGERY Left       FAMILY HISTORY Family History  Problem Relation Age of Onset  . Heart attack Father   . Emphysema Father   . Heart disease Mother   . Hypertension Mother   . Diabetes type II Sister   . Kidney disease Neg Hx   . Bladder Cancer Neg Hx   . Breast cancer Neg Hx     GYNECOLOGIC HISTORY:  No LMP recorded. Patient has had a hysterectomy.     ADVANCED DIRECTIVES:    HEALTH MAINTENANCE: Social History  Substance Use Topics  . Smoking status: Never Smoker  . Smokeless tobacco: Never Used  . Alcohol use No   No Known Allergies  Current Outpatient Prescriptions  Medication Sig Dispense Refill  . carvedilol (COREG) 25 MG tablet Take 25 mg by mouth 2 (two) times daily with a meal.    . ciprofloxacin (CIPRO) 500 MG tablet Take 500 mg by mouth 2 (two) times daily.    [name] diltiazem (CARDIZEM CD) 240 MG 24 hr capsule Take 240 mg by mouth daily.    [name] latanoprost (XALATAN) 0.005 % ophthalmic solution Place 1 drop into both eyes at bedtime.    . pravastatin (PRAVACHOL) 40 MG tablet Take 40 mg by mouth daily at 6 PM.    . raloxifene (EVISTA) 60 MG tablet Take 60 mg by mouth daily.    [name] torsemide (DEMADEX) 20 MG tablet Take 20 mg by mouth as needed.     Current Facility-Administered Medications  Medication Dose Route Frequency Provider Last Rate Last Dose  . BCG vaccine (THERACYS) injection 81 mg  81 mg Bladder Instillation Once Roda Shutters, FNP      . sodium chloride flush (NS) 0.9 % injection 50 mL  50 mL Intracatheter Once [employer], PA-C        OBJECTIVE: There were no vitals taken for this visit.   There is no height or weight on file to calculate BMI.    ECOG FS:1 - Symptomatic but completely ambulatory  General: Well-developed, well-nourished, no acute distress. Eyes: Pink conjunctiva, anicteric sclera. HEENT: Normocephalic, moist mucous membranes, clear oropharnyx. Lungs: Clear to auscultation bilaterally. Heart: Regular rate and rhythm. No rubs, murmurs, or  gallops. Abdomen: Soft, nontender, nondistended. No organomegaly noted, normoactive bowel sounds. Musculoskeletal: No edema, cyanosis, or clubbing.   Neuro: Alert, answering all questions appropriately. Cranial nerves grossly intact. Skin: No rashes or petechiae noted. Psych: Normal affect. Lymphatics: No cervical, clavicular, or axillary LAD.   LAB RESULTS:  No visits with results within 3 Day(s) from this visit.  Latest known visit with results is:  Office Visit on 04/12/2016  Component Date Value Ref Range Status  . Specific Gravity, UA 04/12/2016 1.020  1.005 - 1.030 Final  . pH, UA 04/12/2016 6.0  5.0 - 7.5 Final  . Color, UA 04/12/2016 Yellow  Yellow Final  . Appearance Ur 04/12/2016 Hazy* Clear Final  . Leukocytes, UA 04/12/2016 Negative  Negative Final  . Protein, UA 04/12/2016 1+* Negative/Trace Final  . Glucose, UA 04/12/2016 Negative  Negative Final  . Ketones, UA 04/12/2016 Negative  Negative Final  . RBC, UA 04/12/2016 1+* Negative Final  . Bilirubin, UA 04/12/2016 Negative  Negative Final  . Urobilinogen, Ur 04/12/2016 0.2  0.2 - 1.0 mg/dL  Final  . Nitrite, UA 04/12/2016 Negative  Negative Final  . Microscopic Examination 04/12/2016 See below:   Final  . WBC, UA 04/12/2016 0-5  0 - 5 /hpf Final  . RBC, UA 04/12/2016 3-10* 0 - 2 /hpf Final  . Epithelial Cells (non renal) 04/12/2016 0-10  0 - 10 /hpf Final  . Bacteria, UA 04/12/2016 None seen  None seen/Few Final    STUDIES: No results found.  ASSESSMENT: Iron deficiency anemia, MGUS.  PLAN:   1. Iron deficiency anemia: Possibly secondary to blood loss from gastritis. Her hemoglobin is currently 9.0, her iron stores are mildly decreased, therefore will give one infusion of 510 mg IV Feraheme today. Patient has also been instructed to continue her oral iron supplementation. It is also possible patient may have a component of MDS. Previously a bone marrow biopsy was discussed by another provider, but patient  declined. Return to clinic in 3 months with repeat laboratory work and further evaluation. 2. MGUS: Patient is most recent M spike on December 14, 2015 was reported as 1.1. This is essentially unchanged from previous. Other than her anemia she has no evidence of endorgan disease. Continue to monitor every 6 months. 3. Superficial bladder cancer: Continue follow-up and treatment per urology.  Patient expressed understanding and was in agreement with this plan. She also understands that She can call clinic at any time with any questions, concerns, or complaints.    Lloyd Huger, MD   06/11/2016 10:38 PM

## 2016-06-14 ENCOUNTER — Inpatient Hospital Stay: Payer: Medicare Other

## 2016-06-14 ENCOUNTER — Inpatient Hospital Stay: Payer: Medicare Other | Admitting: Oncology

## 2016-06-22 ENCOUNTER — Other Ambulatory Visit: Payer: Self-pay | Admitting: Oncology

## 2016-06-24 NOTE — Progress Notes (Signed)
[place]  [phone]  Fax:(336) [phone]     [name] DOB: 03/15/1933  MR#: [id number]  MLJ#:[id number]  Patient Care Team: Kirk Ruths, MD as PCP - General (Internal Medicine) Hollice Espy, MD as Consulting Physician (Urology) Kirk Ruths, MD (Internal Medicine) Seeplaputhur Robinette Haines, MD (General Surgery)  CHIEF COMPLAINT:  Iron deficiency anemia, unspecified. MGUS.  INTERVAL HISTORY: Patient returns to clinic today for repeat laboratory work and routine evaluation. She was recently hospitalized for an infection which ultimately required a PICC line placement and 6 weeks of IV antibiotics, completed in December 2017. She currently feels "98-99% better".  She denies any fevers.  She reports feeling a bit lightheaded when changing positions, and takes time to pause after position changes.  She has no other neurologic complaints. She does not complain of any weakness or fatigue today.  She reports a good appetite, and has gained 13 pounds over the past 2.5 months.  She has no chest pain or shortness of breath.  She reports constipation when taking iron pills, self-resolving.  She denies any nausea, vomiting, or diarrhea.  She has noted no melena or hematochezia. She has no urinary complaints.  She reports occasional insomnia, difficulty going to sleep.  REVIEW OF SYSTEMS:   Review of Systems  Constitutional: Negative.  Negative for fever, malaise/fatigue and weight loss.  HENT: Negative for congestion and sinus pain.   Respiratory: Negative.  Negative for cough and shortness of breath.   Cardiovascular: Negative.  Negative for chest pain and leg swelling.  Gastrointestinal: Negative.  Negative for abdominal pain, blood in stool and melena.  Genitourinary: Negative.   Musculoskeletal: Negative.   Neurological: Positive for dizziness. Negative for tingling, weakness and headaches.  Psychiatric/Behavioral: The patient is  nervous/anxious and has insomnia.     As per HPI. Otherwise, a complete review of systems is negative.   PAST MEDICAL HISTORY: Past Medical History:  Diagnosis Date  . Anemia   . Cancer [facility]) 2016   bladder tumor  . Carotid arterial disease ([place]) 01/09/2014   Overview:  Minimal on screening   . Chronic low back pain   . Colon polyp   . DDD (degenerative disc disease), cervical   . DDD (degenerative disc disease), lumbar   . Elevated lipids   . Foot pain   . Glaucoma   . Hypertension   . Multiple gastric ulcers   . Osteoporosis     PAST SURGICAL HISTORY: Past Surgical History:  Procedure Laterality Date  . ABDOMINAL HYSTERECTOMY  1973  . BACK SURGERY     03/1975-ruptured disk, 11/1983-ruptured disk, 11/1984-ruptured disk, 10/1996-ruptured disk, 08/1997-spinal fusion, 02/10/07-spinal fusion [facility]  . CARPAL TUNNEL RELEASE Right   . CATARACT EXTRACTION Bilateral   . CYSTOSCOPY W/ RETROGRADES Bilateral 09/29/2014   Procedure: CYSTOSCOPY WITH RETROGRADE PYELOGRAM;  Surgeon: Hollice Espy, MD;  Location: [facility] ORS;  Service: Urology;  Laterality: Bilateral;  . CYSTOSCOPY WITH BIOPSY N/A 11/02/2014   Procedure: CYSTOSCOPY WITH BIOPSY/WITH MITOMYCIN;  Surgeon: Hollice Espy, MD;  Location: [facility] ORS;  Service: Urology;  Laterality: N/A;  . ESOPHAGOGASTRODUODENOSCOPY (EGD) WITH PROPOFOL N/A 05/10/2015   Procedure: ESOPHAGOGASTRODUODENOSCOPY (EGD) WITH PROPOFOL;  Surgeon: Lollie Sails, MD;  Location: [facility] ENDOSCOPY;  Service: Endoscopy;  Laterality: N/A;  . EYE SURGERY    . FOOT SURGERY Right   . IRRIGATION AND DEBRIDEMENT BUTTOCKS Right 10/21/2015   Procedure: DRAINAGE GLUTEAL ABSCESS;  Surgeon: Christene Lye, MD;  Location: [facility] ORS;  Service: General;  Laterality: Right;  . KNEE ARTHROSCOPY Right    x2  . MANDIBLE SURGERY Bilateral    x2  . SHOULDER ARTHROSCOPY WITH ROTATOR CUFF REPAIR AND SUBACROMIAL DECOMPRESSION Right 05/25/2015   Procedure: SHOULDER ARTHROSCOPY WITH  ROTATOR CUFF REPAIR AND SUBACROMIAL DECOMPRESSION, release long head biceps tendon;  Surgeon: Leanor Kail, MD;  Location: [facility] ORS;  Service: Orthopedics;  Laterality: Right;  . TRANSURETHRAL RESECTION OF BLADDER TUMOR N/A 09/29/2014   Procedure: TRANSURETHRAL RESECTION OF BLADDER TUMOR (TURBT);  Surgeon: Hollice Espy, MD;  Location: [facility] ORS;  Service: Urology;  Laterality: N/A;  . WRIST FRACTURE SURGERY Left     FAMILY HISTORY Family History  Problem Relation Age of Onset  . Heart attack Father   . Emphysema Father   . Heart disease Mother   . Hypertension Mother   . Diabetes type II Sister   . Kidney disease Neg Hx   . Bladder Cancer Neg Hx   . Breast cancer Neg Hx     GYNECOLOGIC HISTORY:  No LMP recorded. Patient has had a hysterectomy.     ADVANCED DIRECTIVES:    HEALTH MAINTENANCE: Social History  Substance Use Topics  . Smoking status: Never Smoker  . Smokeless tobacco: Never Used  . Alcohol use No   No Known Allergies  Current Outpatient Prescriptions  Medication Sig Dispense Refill  . carvedilol (COREG) 25 MG tablet Take 25 mg by mouth 2 (two) times daily with a meal.    . diltiazem (CARDIZEM CD) 240 MG 24 hr capsule Take 240 mg by mouth daily.    [name] latanoprost (XALATAN) 0.005 % ophthalmic solution Place 1 drop into both eyes at bedtime.    . pravastatin (PRAVACHOL) 40 MG tablet Take 40 mg by mouth daily at 6 PM.    . raloxifene (EVISTA) 60 MG tablet Take 60 mg by mouth daily.    [name] torsemide (DEMADEX) 20 MG tablet Take 20 mg by mouth as needed.    . ciprofloxacin (CIPRO) 500 MG tablet Take 500 mg by mouth 2 (two) times daily.     Current Facility-Administered Medications  Medication Dose Route Frequency Provider Last Rate Last Dose  . BCG vaccine (THERACYS) injection 81 mg  81 mg Bladder Instillation Once Roda Shutters, FNP      . sodium chloride flush (NS) 0.9 % injection 50 mL  50 mL Intracatheter Once [employer], PA-C        OBJECTIVE: BP  (!) 176/63 (BP Location: Left Arm, Patient Position: Sitting)   Pulse (!) 53   Temp 98.3 F (36.8 C) (Tympanic)   Resp 18   Wt 149 lb 14.6 oz (68 kg)   BMI 28.33 kg/m    Body mass index is 28.33 kg/m.    ECOG FS:1 - Symptomatic but completely ambulatory  General: Well-developed, well-nourished, no acute distress. Eyes: Pink conjunctiva, anicteric sclera. HEENT: Normocephalic, moist mucous membranes, clear oropharnyx. Lungs: Clear to auscultation bilaterally. Heart: Regular rate and rhythm. No rubs, murmurs, or gallops. Abdomen: Soft, nontender, nondistended. No organomegaly noted, normoactive bowel sounds. Musculoskeletal: No edema, cyanosis, or clubbing.   Neuro: Alert, answering all questions appropriately. Cranial nerves grossly intact. Skin: No rashes or petechiae noted. Psych: Normal affect. Lymphatics: No cervical, clavicular, or axillary LAD.   LAB RESULTS:  Appointment on 06/25/2016  Component Date Value Ref Range Status  . Ferritin 06/25/2016 282  11 - 307 ng/mL Final  . Iron 06/25/2016 80  28 - 170 ug/dL Final  . TIBC 06/25/2016  311  250 - 450 ug/dL Final  . Saturation Ratios 06/25/2016 26  10.4 - 31.8 % Final  . UIBC 06/25/2016 231  ug/dL Final  . WBC 06/25/2016 6.5  3.6 - 11.0 K/uL Final  . RBC 06/25/2016 3.70* 3.80 - 5.20 MIL/uL Final  . Hemoglobin 06/25/2016 11.7* 12.0 - 16.0 g/dL Final  . HCT 06/25/2016 34.5* 35.0 - 47.0 % Final  . MCV 06/25/2016 93.4  80.0 - 100.0 fL Final  . MCH 06/25/2016 31.6  26.0 - 34.0 pg Final  . MCHC 06/25/2016 33.8  32.0 - 36.0 g/dL Final  . RDW 06/25/2016 15.4* 11.5 - 14.5 % Final  . Platelets 06/25/2016 214  150 - 440 K/uL Final  . Neutrophils Relative % 06/25/2016 59  % Final  . Neutro Abs 06/25/2016 3.9  1.4 - 6.5 K/uL Final  . Lymphocytes Relative 06/25/2016 28  % Final  . Lymphs Abs 06/25/2016 1.8  1.0 - 3.6 K/uL Final  . Monocytes Relative 06/25/2016 10  % Final  . Monocytes Absolute 06/25/2016 0.6  0.2 - 0.9 K/uL Final    . Eosinophils Relative 06/25/2016 2  % Final  . Eosinophils Absolute 06/25/2016 0.1  0 - 0.7 K/uL Final  . Basophils Relative 06/25/2016 1  % Final  . Basophils Absolute 06/25/2016 0.0  0 - 0.1 K/uL Final    STUDIES: No results found.  ASSESSMENT: Iron deficiency anemia, MGUS.  PLAN:   1. Iron deficiency anemia: Possibly secondary to blood loss from gastritis. Her hemoglobin is currently 11.7, her iron stores are within normal limits today, therefore no Feraheme infusion is needed today. Patient has been instructed to continue her oral iron supplementation. It is also possible patient may have a component of MDS. Previously a bone marrow biopsy was discussed by another provider, but patient declined. Return to clinic in 4 months with repeat laboratory work and further evaluation. 2. MGUS: Patient is most recent M spike on December 14, 2015 was reported as 1.1. This is essentially unchanged from previous. Other than her anemia she has no evidence of endorgan disease. Continue to monitor every 6 months. 3. Superficial bladder cancer: Continue follow-up and treatment per urology. 4. Insomnia/Anxiety: Discussed 4-7-8 breathing technique at bedtime: breathe in to count of 4, hold breath for count of 7, exhale for count of 8; do 3-5 times for letting go of overactive thoughts.  Provided breath ratio chart to patient for relaxing, balancing, and energizing breathing techniques.   Patient expressed understanding and was in agreement with this plan. She also understands that She can call clinic at any time with any questions, concerns, or complaints.   Lucendia Herrlich, NP  06/25/16 4:20 PM  Patient was seen and evaluated independently and I agree with the assessment and plan as dictated above. Will repeat MGUS labs prior to next clinic visit.  Lloyd Huger, MD 06/28/16 4:27 PM

## 2016-06-25 ENCOUNTER — Inpatient Hospital Stay: Payer: Medicare Other

## 2016-06-25 ENCOUNTER — Inpatient Hospital Stay: Payer: Medicare Other | Attending: Oncology | Admitting: Oncology

## 2016-06-25 VITALS — BP 176/63 | HR 53 | Temp 98.3°F | Resp 18 | Wt 149.9 lb

## 2016-06-25 DIAGNOSIS — M5136 Other intervertebral disc degeneration, lumbar region: Secondary | ICD-10-CM | POA: Insufficient documentation

## 2016-06-25 DIAGNOSIS — D509 Iron deficiency anemia, unspecified: Secondary | ICD-10-CM | POA: Insufficient documentation

## 2016-06-25 DIAGNOSIS — G4700 Insomnia, unspecified: Secondary | ICD-10-CM

## 2016-06-25 DIAGNOSIS — C679 Malignant neoplasm of bladder, unspecified: Secondary | ICD-10-CM | POA: Diagnosis not present

## 2016-06-25 DIAGNOSIS — F419 Anxiety disorder, unspecified: Secondary | ICD-10-CM | POA: Diagnosis not present

## 2016-06-25 DIAGNOSIS — G8929 Other chronic pain: Secondary | ICD-10-CM | POA: Diagnosis not present

## 2016-06-25 DIAGNOSIS — M818 Other osteoporosis without current pathological fracture: Secondary | ICD-10-CM

## 2016-06-25 DIAGNOSIS — D472 Monoclonal gammopathy: Secondary | ICD-10-CM | POA: Insufficient documentation

## 2016-06-25 DIAGNOSIS — M5030 Other cervical disc degeneration, unspecified cervical region: Secondary | ICD-10-CM | POA: Insufficient documentation

## 2016-06-25 DIAGNOSIS — I10 Essential (primary) hypertension: Secondary | ICD-10-CM | POA: Diagnosis not present

## 2016-06-25 DIAGNOSIS — M545 Low back pain: Secondary | ICD-10-CM | POA: Diagnosis not present

## 2016-06-25 DIAGNOSIS — Z79899 Other long term (current) drug therapy: Secondary | ICD-10-CM | POA: Diagnosis not present

## 2016-06-25 LAB — IRON AND TIBC
IRON: 80 ug/dL (ref 28–170)
Saturation Ratios: 26 % (ref 10.4–31.8)
TIBC: 311 ug/dL (ref 250–450)
UIBC: 231 ug/dL

## 2016-06-25 LAB — CBC WITH DIFFERENTIAL/PLATELET
Basophils Absolute: 0 10*3/uL (ref 0–0.1)
Basophils Relative: 1 %
EOS PCT: 2 %
Eosinophils Absolute: 0.1 10*3/uL (ref 0–0.7)
HCT: 34.5 % — ABNORMAL LOW (ref 35.0–47.0)
HEMOGLOBIN: 11.7 g/dL — AB (ref 12.0–16.0)
LYMPHS ABS: 1.8 10*3/uL (ref 1.0–3.6)
LYMPHS PCT: 28 %
MCH: 31.6 pg (ref 26.0–34.0)
MCHC: 33.8 g/dL (ref 32.0–36.0)
MCV: 93.4 fL (ref 80.0–100.0)
Monocytes Absolute: 0.6 10*3/uL (ref 0.2–0.9)
Monocytes Relative: 10 %
NEUTROS PCT: 59 %
Neutro Abs: 3.9 10*3/uL (ref 1.4–6.5)
PLATELETS: 214 10*3/uL (ref 150–440)
RBC: 3.7 MIL/uL — AB (ref 3.80–5.20)
RDW: 15.4 % — ABNORMAL HIGH (ref 11.5–14.5)
WBC: 6.5 10*3/uL (ref 3.6–11.0)

## 2016-06-25 LAB — FERRITIN: Ferritin: 282 ng/mL (ref 11–307)

## 2016-06-25 NOTE — Progress Notes (Signed)
Patient offers no complaints today. 

## 2016-07-11 ENCOUNTER — Encounter: Payer: Self-pay | Admitting: Urology

## 2016-07-11 ENCOUNTER — Ambulatory Visit (INDEPENDENT_AMBULATORY_CARE_PROVIDER_SITE_OTHER): Payer: Medicare Other | Admitting: Urology

## 2016-07-11 VITALS — BP 165/64 | HR 69 | Ht 61.0 in | Wt 150.0 lb

## 2016-07-11 DIAGNOSIS — C675 Malignant neoplasm of bladder neck: Secondary | ICD-10-CM | POA: Diagnosis not present

## 2016-07-11 LAB — URINALYSIS, COMPLETE
Bilirubin, UA: NEGATIVE
GLUCOSE, UA: NEGATIVE
Ketones, UA: NEGATIVE
Nitrite, UA: NEGATIVE
SPEC GRAV UA: 1.015 (ref 1.005–1.030)
UUROB: 0.2 mg/dL (ref 0.2–1.0)
pH, UA: 6 (ref 5.0–7.5)

## 2016-07-11 LAB — MICROSCOPIC EXAMINATION

## 2016-07-11 MED ORDER — CIPROFLOXACIN HCL 500 MG PO TABS
500.0000 mg | ORAL_TABLET | Freq: Once | ORAL | Status: AC
  Administered 2016-07-11: 500 mg via ORAL

## 2016-07-11 MED ORDER — LIDOCAINE HCL 2 % EX GEL
1.0000 "application " | Freq: Once | CUTANEOUS | Status: AC
  Administered 2016-07-11: 1 via URETHRAL

## 2016-07-11 NOTE — Progress Notes (Signed)
12:51 PM  07/11/16   [name] 1932/09/27 [id number]  Referring provider: Kirk Ruths, MD [facility] [place], [place] [address]  Chief Complaint  Patient presents with  . Cysto    51month    HPI: 83 year old nonsmoker with  incidental 11 mm papillary neoplasm near the RIGHT bladder neck. She was taken to the operating room on 09/29/2014 for TURBT, Bilateral retrograde pyelogramat which time a 2 cm extremely spherical mass was identified just proximal to the RIGHT UO. This was resected along with deeper biopsies of the base. Of note, bilateral retrograde pyelogram was negative for any obvious upper tract filling defects or hydronephrosis.   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 The tumor had an inverted architecture. Deeper biopsies did contain muscle after speaking with the pathologist today and the muscle was negative for any tumor. Additonally, CIS was noted.  She returned to the operating room on 11/02/2014 for restaging TURBT. Pathology showed no evidence of residual tumor.  She completed BCG x 6 for induction and first course of maintenence bcg x 3 doses  X 2.  Most recent maintainance dose completed in 07/2015.     Following her last course of BCG,  she issues with her gluteal abscess which has progressed tracking to her psoas and quadratus muscle and questionable involvement of her spinal hardware. She completed long term IV abx by Dr. Ola Spurr with PICC line.  She is undergone 2 surgical drainage's at this time. AFB cultures negative.  Most recent imaging in the form of CT abdomen and pelvis on 04/2016 shows to recurrent abscess. She is now being evaluated by neurosurgery in [place].    She returns today for surveillance cystoscopy. She has no urinary complaints today.   PMH: Past Medical History:  Diagnosis Date  . Anemia   . Cancer [facility]) 2016   bladder tumor    . Carotid arterial disease ([place]) 01/09/2014   Overview:  Minimal on screening   . Chronic low back pain   . Colon polyp   . DDD (degenerative disc disease), cervical   . DDD (degenerative disc disease), lumbar   . Elevated lipids   . Foot pain   . Glaucoma   . Hypertension   . Multiple gastric ulcers   . Osteoporosis     Surgical History: Past Surgical History:  Procedure Laterality Date  . ABDOMINAL HYSTERECTOMY  1973  . BACK SURGERY     03/1975-ruptured disk, 11/1983-ruptured disk, 11/1984-ruptured disk, 10/1996-ruptured disk, 08/1997-spinal fusion, 02/10/07-spinal fusion [facility]  . CARPAL TUNNEL RELEASE Right   . CATARACT EXTRACTION Bilateral   . CYSTOSCOPY W/ RETROGRADES Bilateral 09/29/2014   Procedure: CYSTOSCOPY WITH RETROGRADE PYELOGRAM;  Surgeon: Hollice Espy, MD;  Location: [facility] ORS;  Service: Urology;  Laterality: Bilateral;  . CYSTOSCOPY WITH BIOPSY N/A 11/02/2014   Procedure: CYSTOSCOPY WITH BIOPSY/WITH MITOMYCIN;  Surgeon: Hollice Espy, MD;  Location: [facility] ORS;  Service: Urology;  Laterality: N/A;  . ESOPHAGOGASTRODUODENOSCOPY (EGD) WITH PROPOFOL N/A 05/10/2015   Procedure: ESOPHAGOGASTRODUODENOSCOPY (EGD) WITH PROPOFOL;  Surgeon: Lollie Sails, MD;  Location: [facility] ENDOSCOPY;  Service: Endoscopy;  Laterality: N/A;  . EYE SURGERY    . FOOT SURGERY Right   . IRRIGATION AND DEBRIDEMENT BUTTOCKS Right 10/21/2015   Procedure: DRAINAGE GLUTEAL ABSCESS;  Surgeon: Christene Lye, MD;  Location: [facility] ORS;  Service: General;  Laterality: Right;  . KNEE ARTHROSCOPY Right    x2  .  MANDIBLE SURGERY Bilateral    x2  . SHOULDER ARTHROSCOPY WITH ROTATOR CUFF REPAIR AND SUBACROMIAL DECOMPRESSION Right 05/25/2015   Procedure: SHOULDER ARTHROSCOPY WITH ROTATOR CUFF REPAIR AND SUBACROMIAL DECOMPRESSION, release long head biceps tendon;  Surgeon: Leanor Kail, MD;  Location: [facility] ORS;  Service: Orthopedics;  Laterality: Right;  . TRANSURETHRAL RESECTION OF BLADDER TUMOR N/A  09/29/2014   Procedure: TRANSURETHRAL RESECTION OF BLADDER TUMOR (TURBT);  Surgeon: Hollice Espy, MD;  Location: [facility] ORS;  Service: Urology;  Laterality: N/A;  . WRIST FRACTURE SURGERY Left     Home Medications:  Allergies as of 07/11/2016   No Known Allergies     Medication List       Accurate as of 07/11/16 12:51 PM. Always use your most recent med list.          carvedilol 25 MG tablet Commonly known as:  COREG Take 25 mg by mouth 2 (two) times daily with a meal.   diltiazem 240 MG 24 hr capsule Commonly known as:  CARDIZEM CD Take 240 mg by mouth daily.   latanoprost 0.005 % ophthalmic solution Commonly known as:  XALATAN Place 1 drop into both eyes at bedtime.   pravastatin 40 MG tablet Commonly known as:  PRAVACHOL Take 40 mg by mouth daily at 6 PM.   raloxifene 60 MG tablet Commonly known as:  EVISTA Take 60 mg by mouth daily.   torsemide 20 MG tablet Commonly known as:  DEMADEX Take 20 mg by mouth as needed.       Allergies:  No Known Allergies  Family History: Family History  Problem Relation Age of Onset  . Heart attack Father   . Emphysema Father   . Heart disease Mother   . Hypertension Mother   . Diabetes type II Sister   . Kidney disease Neg Hx   . Bladder Cancer Neg Hx   . Breast cancer Neg Hx     Social History:  reports that she has never smoked. She has never used smokeless tobacco. She reports that she does not drink alcohol or use drugs.   Physical Exam: BP (!) 165/64   Pulse 69   Ht 5\' 1"  (1.549 m)   Wt 150 lb (68 kg)   BMI 28.34 kg/m   Constitutional:  Alert and oriented, No acute distress. HEENT: [place] AT, moist mucus membranes.  Trachea midline, no masses. Cardiovascular: No clubbing, cyanosis, or edema. Respiratory: Normal respiratory effort, no increased work of breathing. GI: Abdomen is soft, nontender, nondistended, no abdominal masses  GU:  Normal external genitalia. Normal urethral meatus. Skin: No rashes, bruises  or suspicious lesions. Neurologic: Grossly intact, no focal deficits, moving all 4 extremities. Psychiatric: Normal mood and affect.  Laboratory Data: Lab Results  Component Value Date   WBC 6.5 06/25/2016   HGB 11.7 (L) 06/25/2016   HCT 34.5 (L) 06/25/2016   MCV 93.4 06/25/2016   PLT 214 06/25/2016    Urinalysis UA reviewed, no evidence of infection   Cystoscopy Procedure Note  Patient identification was confirmed, informed consent was obtained, and patient was prepped using Betadine solution.  Lidocaine jelly was administered per urethral meatus.    Preoperative abx where received prior to procedure.    Procedure: - Flexible cystoscope introduced, without any difficulty.   - Thorough search of the bladder revealed:    normal urethral meatus    normal urothelium    no stones    no ulcers     no tumors  no urethral polyps    no trabeculation  - Ureteral orifices were normal in position and appearance.  Post-Procedure: - Patient tolerated the procedure well  Assessment & Plan:  83 year old female with a history of high-grade T1, Tis of the right bladder neck dx 09/2014 followed by induction BCG x 6, and most recently  maintenance BCG completed 04/2015 and 07/2015.  She returns today for surveillance cystoscopy.   1. Malignant neoplasm of bladder neck Cystoscopy today negative.   Recommend q3 month surveillance cystoscopy, if negative next visit, will transition to q6 months Given issues with gluteal abscess of unclear etiology with single culture growing bacillus, will defer further BCG at this time   Return in about 3 months (around 10/08/2016) for cysto.     Hollice Espy, MD  [facility] [address], [place], [place] [address] [phone]

## 2016-07-12 ENCOUNTER — Telehealth: Payer: Self-pay

## 2016-07-12 NOTE — Telephone Encounter (Signed)
Pt called after hours triage line last night with some concerns of what was on her AVS. Pt stated that she was confused as her AVS previously have not said malignant neoplasm of bladder neck. Per pt they have previously they have said malignant neoplasm of the bladder. Pt also stated that at her visit she was with the understanding there was something else noted in her bladder that Dr. Erlene Quan wants to keep an eye on. Read pt Dr. Erlene Quan dictation of cysto being negative which confused pt more. Please advise.

## 2016-07-13 ENCOUNTER — Telehealth: Payer: Self-pay

## 2016-07-13 NOTE — Telephone Encounter (Signed)
Spoke with pt in reference to concerns that were voiced on AVS post cysto. Per Dr. Erlene Quan no evidence of cancer and everything is ok. Will need to f/u in 40mo for cysto. Made pt aware. Pt voiced understanding.

## 2016-10-10 ENCOUNTER — Encounter: Payer: Self-pay | Admitting: Urology

## 2016-10-10 ENCOUNTER — Ambulatory Visit: Payer: Medicare Other | Admitting: Urology

## 2016-10-10 VITALS — BP 134/66 | HR 74 | Ht 61.0 in | Wt 150.0 lb

## 2016-10-10 DIAGNOSIS — C675 Malignant neoplasm of bladder neck: Secondary | ICD-10-CM

## 2016-10-10 LAB — URINALYSIS, COMPLETE
BILIRUBIN UA: NEGATIVE
GLUCOSE, UA: NEGATIVE
Nitrite, UA: NEGATIVE
PH UA: 7 (ref 5.0–7.5)
Specific Gravity, UA: 1.02 (ref 1.005–1.030)
UUROB: 1 mg/dL (ref 0.2–1.0)

## 2016-10-10 LAB — MICROSCOPIC EXAMINATION

## 2016-10-10 MED ORDER — CIPROFLOXACIN HCL 500 MG PO TABS
500.0000 mg | ORAL_TABLET | Freq: Once | ORAL | Status: AC
  Administered 2016-10-10: 500 mg via ORAL

## 2016-10-10 MED ORDER — LIDOCAINE HCL 2 % EX GEL
1.0000 "application " | Freq: Once | CUTANEOUS | Status: AC
  Administered 2016-10-10: 1 via URETHRAL

## 2016-10-10 NOTE — Progress Notes (Signed)
10:29 AM  10/10/16   [name] [birth year] [id number]  Referring provider: Kirk Ruths, MD [facility] [place], [place] [address]  Chief Complaint  Patient presents with  . Cysto    HPI: 83 year old nonsmoker with  incidental 11 mm papillary neoplasm near the RIGHT bladder neck. She was taken to the operating room on 09/29/2014 for TURBT, Bilateral retrograde pyelogramat which time a 2 cm extremely spherical mass was identified just proximal to the RIGHT UO. This was resected along with deeper biopsies of the base. Of note, bilateral retrograde pyelogram was negative for any obvious upper tract filling defects or hydronephrosis.   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 The tumor had an inverted architecture. Deeper biopsies did contain muscle after speaking with the pathologist today and the muscle was negative for any tumor. Additonally, CIS was noted.  She returned to the operating room on 11/02/2014 for restaging TURBT. Pathology showed no evidence of residual tumor.  She completed BCG x 6 for induction and first course of maintenence bcg x 3 doses  X 2.  Most recent maintainance dose completed in 07/2015.     Following her last course of BCG,  she issues with her gluteal abscess which has progressed tracking to her psoas and quadratus muscle and questionable involvement of her spinal hardware. She completed long term IV abx by Dr. Ola Spurr with PICC line.  She underwent 2 surgical drainage's at this time. AFB cultures negative.    She returns today for surveillance cystoscopy.  Last cystoscopy 3 months ago and ED. She has no urinary complaints today.   PMH: Past Medical History:  Diagnosis Date  . Anemia   . Cancer [facility]) 2016   bladder tumor  . Carotid arterial disease ([place]) 01/09/2014   Overview:  Minimal on screening   . Chronic low back pain   . Colon polyp   .  DDD (degenerative disc disease), cervical   . DDD (degenerative disc disease), lumbar   . Elevated lipids   . Foot pain   . Glaucoma   . Hypertension   . Multiple gastric ulcers   . Osteoporosis     Surgical History: Past Surgical History:  Procedure Laterality Date  . ABDOMINAL HYSTERECTOMY  1973  . BACK SURGERY     03/1975-ruptured disk, 11/1983-ruptured disk, 11/1984-ruptured disk, 10/1996-ruptured disk, 08/1997-spinal fusion, 02/10/07-spinal fusion [facility]  . CARPAL TUNNEL RELEASE Right   . CATARACT EXTRACTION Bilateral   . CYSTOSCOPY W/ RETROGRADES Bilateral 09/29/2014   Procedure: CYSTOSCOPY WITH RETROGRADE PYELOGRAM;  Surgeon: Hollice Espy, MD;  Location: [facility] ORS;  Service: Urology;  Laterality: Bilateral;  . CYSTOSCOPY WITH BIOPSY N/A 11/02/2014   Procedure: CYSTOSCOPY WITH BIOPSY/WITH MITOMYCIN;  Surgeon: Hollice Espy, MD;  Location: [facility] ORS;  Service: Urology;  Laterality: N/A;  . ESOPHAGOGASTRODUODENOSCOPY (EGD) WITH PROPOFOL N/A 05/10/2015   Procedure: ESOPHAGOGASTRODUODENOSCOPY (EGD) WITH PROPOFOL;  Surgeon: Lollie Sails, MD;  Location: [facility] ENDOSCOPY;  Service: Endoscopy;  Laterality: N/A;  . EYE SURGERY    . FOOT SURGERY Right   . IRRIGATION AND DEBRIDEMENT BUTTOCKS Right 10/21/2015   Procedure: DRAINAGE GLUTEAL ABSCESS;  Surgeon: Christene Lye, MD;  Location: [facility] ORS;  Service: General;  Laterality: Right;  . KNEE ARTHROSCOPY Right    x2  . MANDIBLE SURGERY Bilateral    x2  . SHOULDER ARTHROSCOPY WITH ROTATOR CUFF REPAIR AND SUBACROMIAL DECOMPRESSION Right 05/25/2015   Procedure: SHOULDER ARTHROSCOPY  WITH ROTATOR CUFF REPAIR AND SUBACROMIAL DECOMPRESSION, release long head biceps tendon;  Surgeon: Leanor Kail, MD;  Location: [facility] ORS;  Service: Orthopedics;  Laterality: Right;  . TRANSURETHRAL RESECTION OF BLADDER TUMOR N/A 09/29/2014   Procedure: TRANSURETHRAL RESECTION OF BLADDER TUMOR (TURBT);  Surgeon: Hollice Espy, MD;  Location: [facility] ORS;   Service: Urology;  Laterality: N/A;  . WRIST FRACTURE SURGERY Left     Home Medications:  Allergies as of 10/10/2016   No Known Allergies     Medication List       Accurate as of 10/10/16 10:29 AM. Always use your most recent med list.          carvedilol 25 MG tablet Commonly known as:  COREG Take 25 mg by mouth 2 (two) times daily with a meal.   diltiazem 240 MG 24 hr capsule Commonly known as:  CARDIZEM CD Take 240 mg by mouth daily.   latanoprost 0.005 % ophthalmic solution Commonly known as:  XALATAN Place 1 drop into both eyes at bedtime.   pravastatin 40 MG tablet Commonly known as:  PRAVACHOL Take 40 mg by mouth daily at 6 PM.   raloxifene 60 MG tablet Commonly known as:  EVISTA Take 60 mg by mouth daily.   torsemide 20 MG tablet Commonly known as:  DEMADEX Take 20 mg by mouth as needed.       Allergies:  No Known Allergies  Family History: Family History  Problem Relation Age of Onset  . Heart attack Father   . Emphysema Father   . Heart disease Mother   . Hypertension Mother   . Diabetes type II Sister   . Kidney disease Neg Hx   . Bladder Cancer Neg Hx   . Breast cancer Neg Hx     Social History:  reports that she has never smoked. She has never used smokeless tobacco. She reports that she does not drink alcohol or use drugs.   Physical Exam: BP 134/66 (BP Location: Left Arm, Patient Position: Sitting, Cuff Size: Normal)   Pulse 74   Ht 5\' 1"  (1.549 m)   Wt 150 lb (68 kg)   BMI 28.34 kg/m   Constitutional:  Alert and oriented, No acute distress. HEENT:  AT, moist mucus membranes.  Trachea midline, no masses. Cardiovascular: No clubbing, cyanosis, or edema. Respiratory: Normal respiratory effort, no increased work of breathing. GI: Abdomen is soft, nontender, nondistended, no abdominal masses  GU:  Normal external genitalia. Normal urethral meatus. Skin: No rashes, bruises or suspicious lesions. Neurologic: Grossly intact, no  focal deficits, moving all 4 extremities. Psychiatric: Normal mood and affect.  Laboratory Data: Lab Results  Component Value Date   WBC 6.5 06/25/2016   HGB 11.7 (L) 06/25/2016   HCT 34.5 (L) 06/25/2016   MCV 93.4 06/25/2016   PLT 214 06/25/2016    Urinalysis UA reviewed, no evidence of infection   Cystoscopy Procedure Note  Patient identification was confirmed, informed consent was obtained, and patient was prepped using Betadine solution.  Lidocaine jelly was administered per urethral meatus.    Preoperative abx where received prior to procedure.    Procedure: - Flexible cystoscope introduced, without any difficulty.   - Thorough search of the bladder revealed:    normal urethral meatus    normal urothelium    no stones    no ulcers     no tumors    no urethral polyps    no trabeculation  - Ureteral orifices were normal in  position and appearance.  Post-Procedure: - Patient tolerated the procedure well  Assessment & Plan:  83 year old female with a history of high-grade T1, Tis of the right bladder neck dx 09/2014 followed by induction BCG x 6, and most recently  maintenance BCG completed 04/2015 and 07/2015.  She returns today for surveillance cystoscopy.   1. Malignant neoplasm of bladder neck Cystoscopy today negative.   Obtained for further BCG given the above history. We will increase interval of surveillance cystoscopy to Q6 months.   Return in about 6 months (around 04/12/2017) for cystsocopy/  urine cytology.     Hollice Espy, MD  [facility] [address] [place], [place], [place] [address] [phone]

## 2016-10-12 ENCOUNTER — Inpatient Hospital Stay: Payer: Medicare Other | Attending: Oncology

## 2016-10-12 ENCOUNTER — Other Ambulatory Visit: Payer: Self-pay

## 2016-10-12 DIAGNOSIS — D472 Monoclonal gammopathy: Secondary | ICD-10-CM

## 2016-10-12 DIAGNOSIS — M5136 Other intervertebral disc degeneration, lumbar region: Secondary | ICD-10-CM | POA: Diagnosis not present

## 2016-10-12 DIAGNOSIS — M545 Low back pain: Secondary | ICD-10-CM | POA: Insufficient documentation

## 2016-10-12 DIAGNOSIS — M810 Age-related osteoporosis without current pathological fracture: Secondary | ICD-10-CM | POA: Diagnosis not present

## 2016-10-12 DIAGNOSIS — Z8711 Personal history of peptic ulcer disease: Secondary | ICD-10-CM | POA: Diagnosis not present

## 2016-10-12 DIAGNOSIS — I2510 Atherosclerotic heart disease of native coronary artery without angina pectoris: Secondary | ICD-10-CM | POA: Insufficient documentation

## 2016-10-12 DIAGNOSIS — Z79899 Other long term (current) drug therapy: Secondary | ICD-10-CM | POA: Diagnosis not present

## 2016-10-12 DIAGNOSIS — M5030 Other cervical disc degeneration, unspecified cervical region: Secondary | ICD-10-CM | POA: Insufficient documentation

## 2016-10-12 DIAGNOSIS — E785 Hyperlipidemia, unspecified: Secondary | ICD-10-CM | POA: Diagnosis not present

## 2016-10-12 DIAGNOSIS — G8929 Other chronic pain: Secondary | ICD-10-CM | POA: Insufficient documentation

## 2016-10-12 DIAGNOSIS — D509 Iron deficiency anemia, unspecified: Secondary | ICD-10-CM | POA: Insufficient documentation

## 2016-10-12 DIAGNOSIS — C679 Malignant neoplasm of bladder, unspecified: Secondary | ICD-10-CM | POA: Diagnosis not present

## 2016-10-12 DIAGNOSIS — I10 Essential (primary) hypertension: Secondary | ICD-10-CM | POA: Diagnosis not present

## 2016-10-12 LAB — CBC WITH DIFFERENTIAL/PLATELET
BASOS ABS: 0 10*3/uL (ref 0–0.1)
Basophils Relative: 0 %
Eosinophils Absolute: 0.1 10*3/uL (ref 0–0.7)
Eosinophils Relative: 1 %
HEMATOCRIT: 27.4 % — AB (ref 35.0–47.0)
HEMOGLOBIN: 9.2 g/dL — AB (ref 12.0–16.0)
Lymphocytes Relative: 21 %
Lymphs Abs: 1.9 10*3/uL (ref 1.0–3.6)
MCH: 29.8 pg (ref 26.0–34.0)
MCHC: 33.7 g/dL (ref 32.0–36.0)
MCV: 88.4 fL (ref 80.0–100.0)
MONO ABS: 0.7 10*3/uL (ref 0.2–0.9)
Monocytes Relative: 8 %
NEUTROS ABS: 6 10*3/uL (ref 1.4–6.5)
NEUTROS PCT: 70 %
Platelets: 371 10*3/uL (ref 150–440)
RBC: 3.1 MIL/uL — AB (ref 3.80–5.20)
RDW: 14.1 % (ref 11.5–14.5)
WBC: 8.7 10*3/uL (ref 3.6–11.0)

## 2016-10-12 LAB — FERRITIN: Ferritin: 332 ng/mL — ABNORMAL HIGH (ref 11–307)

## 2016-10-12 LAB — IRON AND TIBC
IRON: 21 ug/dL — AB (ref 28–170)
SATURATION RATIOS: 9 % — AB (ref 10.4–31.8)
TIBC: 235 ug/dL — AB (ref 250–450)
UIBC: 214 ug/dL

## 2016-10-13 LAB — IGG, IGA, IGM
IgA: 63 mg/dL — ABNORMAL LOW (ref 64–422)
IgG (Immunoglobin G), Serum: 1238 mg/dL (ref 700–1600)
IgM, Serum: 20 mg/dL — ABNORMAL LOW (ref 26–217)

## 2016-10-14 NOTE — Progress Notes (Signed)
[place]  [phone]  Fax:([phone]     [name] DOB: 10/21/1932  MR#: [id number]  JQB#:[id number]  Patient Care Team: Kirk Ruths, MD as PCP - General (Internal Medicine) Hollice Espy, MD as Consulting Physician (Urology) Kirk Ruths, MD (Internal Medicine) Christene Lye, MD (General Surgery)  CHIEF COMPLAINT:  Iron deficiency anemia, unspecified. MGUS.  INTERVAL HISTORY: Patient returns to clinic today for repeat laboratory work and routine evaluation. She currently feels well and is asymptomatic. She does not complain of weakness or fatigue today. She has no neurologic complaints. She denies any recent fevers or illnesses. She has no chest pain or shortness of breath.  She denies any nausea, vomiting, constipation, or diarrhea. She has noted no melena or hematochezia. She has no urinary complaints.  Patient offers no specific complaints today.   REVIEW OF SYSTEMS:   Review of Systems  Constitutional: Negative.  Negative for fever, malaise/fatigue and weight loss.  HENT: Negative for congestion and sinus pain.   Respiratory: Negative.  Negative for cough and shortness of breath.   Cardiovascular: Negative.  Negative for chest pain and leg swelling.  Gastrointestinal: Negative.  Negative for abdominal pain, blood in stool and melena.  Genitourinary: Negative.   Musculoskeletal: Negative.   Neurological: Negative.  Negative for dizziness, tingling, weakness and headaches.  Psychiatric/Behavioral: Negative.  The patient is not nervous/anxious and does not have insomnia.     As per HPI. Otherwise, a complete review of systems is negative.   PAST MEDICAL HISTORY: Past Medical History:  Diagnosis Date  . Anemia   . Cancer [facility]) 2016   bladder tumor  . Carotid arterial disease ([place]) 01/09/2014   Overview:  Minimal on screening   . Chronic low back pain   . Colon polyp   . DDD (degenerative disc disease),  cervical   . DDD (degenerative disc disease), lumbar   . Elevated lipids   . Foot pain   . Glaucoma   . Hypertension   . Multiple gastric ulcers   . Osteoporosis     PAST SURGICAL HISTORY: Past Surgical History:  Procedure Laterality Date  . ABDOMINAL HYSTERECTOMY  1973  . BACK SURGERY     03/1975-ruptured disk, 11/1983-ruptured disk, 11/1984-ruptured disk, 10/1996-ruptured disk, 08/1997-spinal fusion, 02/10/07-spinal fusion [facility]  . CARPAL TUNNEL RELEASE Right   . CATARACT EXTRACTION Bilateral   . CYSTOSCOPY W/ RETROGRADES Bilateral 09/29/2014   Procedure: CYSTOSCOPY WITH RETROGRADE PYELOGRAM;  Surgeon: Hollice Espy, MD;  Location: [facility] ORS;  Service: Urology;  Laterality: Bilateral;  . CYSTOSCOPY WITH BIOPSY N/A 11/02/2014   Procedure: CYSTOSCOPY WITH BIOPSY/WITH MITOMYCIN;  Surgeon: Hollice Espy, MD;  Location: [facility] ORS;  Service: Urology;  Laterality: N/A;  . ESOPHAGOGASTRODUODENOSCOPY (EGD) WITH PROPOFOL N/A 05/10/2015   Procedure: ESOPHAGOGASTRODUODENOSCOPY (EGD) WITH PROPOFOL;  Surgeon: Lollie Sails, MD;  Location: [facility] ENDOSCOPY;  Service: Endoscopy;  Laterality: N/A;  . EYE SURGERY    . FOOT SURGERY Right   . IRRIGATION AND DEBRIDEMENT BUTTOCKS Right 10/21/2015   Procedure: DRAINAGE GLUTEAL ABSCESS;  Surgeon: Christene Lye, MD;  Location: [facility] ORS;  Service: General;  Laterality: Right;  . KNEE ARTHROSCOPY Right    x2  . MANDIBLE SURGERY Bilateral    x2  . SHOULDER ARTHROSCOPY WITH ROTATOR CUFF REPAIR AND SUBACROMIAL DECOMPRESSION Right 05/25/2015   Procedure: SHOULDER ARTHROSCOPY WITH ROTATOR CUFF REPAIR AND SUBACROMIAL DECOMPRESSION, release long head biceps tendon;  Surgeon: Leanor Kail, MD;  Location: [facility] ORS;  Service: Orthopedics;  Laterality: Right;  . TRANSURETHRAL RESECTION OF BLADDER TUMOR N/A 09/29/2014   Procedure: TRANSURETHRAL RESECTION OF BLADDER TUMOR (TURBT);  Surgeon: Hollice Espy, MD;  Location: [facility] ORS;  Service: Urology;  Laterality:  N/A;  . WRIST FRACTURE SURGERY Left     FAMILY HISTORY Family History  Problem Relation Age of Onset  . Heart attack Father   . Emphysema Father   . Heart disease Mother   . Hypertension Mother   . Diabetes type II Sister   . Kidney disease Neg Hx   . Bladder Cancer Neg Hx   . Breast cancer Neg Hx     GYNECOLOGIC HISTORY:  No LMP recorded. Patient has had a hysterectomy.     ADVANCED DIRECTIVES:    HEALTH MAINTENANCE: Social History  Substance Use Topics  . Smoking status: Never Smoker  . Smokeless tobacco: Never Used  . Alcohol use No   No Known Allergies  Current Outpatient Prescriptions  Medication Sig Dispense Refill  . carvedilol (COREG) 25 MG tablet Take 25 mg by mouth 2 (two) times daily with a meal.    . diltiazem (CARDIZEM CD) 240 MG 24 hr capsule Take 240 mg by mouth daily.    [name] latanoprost (XALATAN) 0.005 % ophthalmic solution Place 1 drop into both eyes at bedtime.    . pravastatin (PRAVACHOL) 40 MG tablet Take 40 mg by mouth daily at 6 PM.    . raloxifene (EVISTA) 60 MG tablet Take 60 mg by mouth daily.    [name] torsemide (DEMADEX) 20 MG tablet Take 20 mg by mouth as needed.     Current Facility-Administered Medications  Medication Dose Route Frequency Provider Last Rate Last Dose  . BCG vaccine (THERACYS) injection 81 mg  81 mg Bladder Instillation Once Roda Shutters, FNP      . sodium chloride flush (NS) 0.9 % injection 50 mL  50 mL Intracatheter Once McGowan, Shannon A, PA-C        OBJECTIVE: BP (!) 141/71   Pulse 61   Temp 98.1 F (36.7 C) (Tympanic)   Resp 20   Wt 157 lb (71.2 kg)   BMI 29.66 kg/m    Body mass index is 29.66 kg/m.    ECOG FS:1 - Symptomatic but completely ambulatory  General: Well-developed, well-nourished, no acute distress. Eyes: Pink conjunctiva, anicteric sclera. Lungs: Clear to auscultation bilaterally. Heart: Regular rate and rhythm. No rubs, murmurs, or gallops. Abdomen: Soft, nontender, nondistended. No  organomegaly noted, normoactive bowel sounds. Musculoskeletal: No edema, cyanosis, or clubbing.   Neuro: Alert, answering all questions appropriately. Cranial nerves grossly intact. Skin: No rashes or petechiae noted. Psych: Normal affect.   LAB RESULTS:  CBC    Component Value Date/Time   WBC 8.7 10/12/2016 1305   RBC 3.10 (L) 10/12/2016 1305   HGB 9.2 (L) 10/12/2016 1305   HGB 8.9 (L) 09/08/2014 1100   HCT 27.4 (L) 10/12/2016 1305   HCT 27.6 (L) 09/05/2015 0900   PLT 371 10/12/2016 1305   PLT 389 (H) 09/05/2015 0900   MCV 88.4 10/12/2016 1305   MCV 83 09/05/2015 0900   MCH 29.8 10/12/2016 1305   MCHC 33.7 10/12/2016 1305   RDW 14.1 10/12/2016 1305   RDW 17.6 (H) 09/05/2015 0900   LYMPHSABS 1.9 10/12/2016 1305   LYMPHSABS 1.8 09/05/2015 0900   MONOABS 0.7 10/12/2016 1305   EOSABS 0.1 10/12/2016 1305   EOSABS 0.1 09/05/2015 0900   BASOSABS 0.0 10/12/2016 1305   BASOSABS 0.0 09/05/2015 0900  Lab Results  Component Value Date   IRON 21 (L) 10/12/2016   TIBC 235 (L) 10/12/2016   IRONPCTSAT 9 (L) 10/12/2016   Lab Results  Component Value Date   FERRITIN 332 (H) 10/12/2016   Lab Results  Component Value Date   TOTALPROTELP 6.8 10/12/2016   ALBUMINELP 3.1 10/12/2016   A1GS 0.4 10/12/2016   A2GS 1.1 (H) 10/12/2016   BETS 1.0 10/12/2016   GAMS 1.2 10/12/2016   MSPIKE 0.7 (H) 10/12/2016   SPEI Comment 10/12/2016     STUDIES: No results found.  ASSESSMENT: Iron deficiency anemia, MGUS.  PLAN:   1. Iron deficiency anemia: Possibly secondary to blood loss from gastritis. Patient's hemoglobin and iron stores have trended down, therefore will proceed with 510 mg IV Feraheme today. Patient will return to clinic in 1 week for a second infusion. Patient has been instructed to continue her oral iron supplementation. It is also possible patient may have a component of MDS. Previously a bone marrow biopsy was discussed by another provider, but patient declined. Return to  clinic in 3 months with repeat laboratory work and further evaluation. 2. MGUS: Patient is most recent M spike on was reported 0.7. This is slightly decreased from previous of 1.1.  Other than her anemia she has no evidence of endorgan disease. Continue to monitor every 6 months. 3. Superficial bladder cancer: Continue follow-up and treatment per urology.  Patient expressed understanding and was in agreement with this plan. She also understands that She can call clinic at any time with any questions, concerns, or complaints.    Lloyd Huger, MD 10/20/16 8:39 AM

## 2016-10-15 ENCOUNTER — Other Ambulatory Visit: Payer: Medicare Other

## 2016-10-15 ENCOUNTER — Other Ambulatory Visit: Payer: Self-pay

## 2016-10-15 ENCOUNTER — Inpatient Hospital Stay (HOSPITAL_BASED_OUTPATIENT_CLINIC_OR_DEPARTMENT_OTHER): Payer: Medicare Other | Admitting: Oncology

## 2016-10-15 ENCOUNTER — Inpatient Hospital Stay: Payer: Medicare Other

## 2016-10-15 VITALS — BP 141/71 | HR 61 | Temp 98.1°F | Resp 20 | Wt 157.0 lb

## 2016-10-15 DIAGNOSIS — G8929 Other chronic pain: Secondary | ICD-10-CM

## 2016-10-15 DIAGNOSIS — C679 Malignant neoplasm of bladder, unspecified: Secondary | ICD-10-CM | POA: Diagnosis not present

## 2016-10-15 DIAGNOSIS — D509 Iron deficiency anemia, unspecified: Secondary | ICD-10-CM | POA: Diagnosis not present

## 2016-10-15 DIAGNOSIS — M5030 Other cervical disc degeneration, unspecified cervical region: Secondary | ICD-10-CM | POA: Diagnosis not present

## 2016-10-15 DIAGNOSIS — M810 Age-related osteoporosis without current pathological fracture: Secondary | ICD-10-CM | POA: Diagnosis not present

## 2016-10-15 DIAGNOSIS — D472 Monoclonal gammopathy: Secondary | ICD-10-CM

## 2016-10-15 DIAGNOSIS — M5136 Other intervertebral disc degeneration, lumbar region: Secondary | ICD-10-CM | POA: Diagnosis not present

## 2016-10-15 DIAGNOSIS — Z79899 Other long term (current) drug therapy: Secondary | ICD-10-CM | POA: Diagnosis not present

## 2016-10-15 DIAGNOSIS — E785 Hyperlipidemia, unspecified: Secondary | ICD-10-CM | POA: Diagnosis not present

## 2016-10-15 DIAGNOSIS — M545 Low back pain: Secondary | ICD-10-CM | POA: Diagnosis not present

## 2016-10-15 DIAGNOSIS — I10 Essential (primary) hypertension: Secondary | ICD-10-CM

## 2016-10-15 DIAGNOSIS — I2510 Atherosclerotic heart disease of native coronary artery without angina pectoris: Secondary | ICD-10-CM | POA: Diagnosis not present

## 2016-10-15 DIAGNOSIS — Z8711 Personal history of peptic ulcer disease: Secondary | ICD-10-CM

## 2016-10-15 LAB — PROTEIN ELECTROPHORESIS, SERUM
A/G Ratio: 0.8 (ref 0.7–1.7)
ALBUMIN ELP: 3.1 g/dL (ref 2.9–4.4)
Alpha-1-Globulin: 0.4 g/dL (ref 0.0–0.4)
Alpha-2-Globulin: 1.1 g/dL — ABNORMAL HIGH (ref 0.4–1.0)
Beta Globulin: 1 g/dL (ref 0.7–1.3)
GAMMA GLOBULIN: 1.2 g/dL (ref 0.4–1.8)
Globulin, Total: 3.7 g/dL (ref 2.2–3.9)
M-Spike, %: 0.7 g/dL — ABNORMAL HIGH
TOTAL PROTEIN ELP: 6.8 g/dL (ref 6.0–8.5)

## 2016-10-15 LAB — KAPPA/LAMBDA LIGHT CHAINS
Kappa free light chain: 116.1 mg/L — ABNORMAL HIGH (ref 3.3–19.4)
Kappa, lambda light chain ratio: 11.85 — ABNORMAL HIGH (ref 0.26–1.65)
Lambda free light chains: 9.8 mg/L (ref 5.7–26.3)

## 2016-10-15 MED ORDER — SODIUM CHLORIDE 0.9 % IV SOLN
Freq: Once | INTRAVENOUS | Status: AC
  Administered 2016-10-15: 15:00:00 via INTRAVENOUS
  Filled 2016-10-15: qty 1000

## 2016-10-15 MED ORDER — FERUMOXYTOL INJECTION 510 MG/17 ML
510.0000 mg | Freq: Once | INTRAVENOUS | Status: AC
  Administered 2016-10-15: 510 mg via INTRAVENOUS
  Filled 2016-10-15: qty 17

## 2016-10-15 NOTE — Progress Notes (Signed)
Patient denies any concerns today.  

## 2016-10-23 ENCOUNTER — Inpatient Hospital Stay: Payer: Medicare Other

## 2016-10-23 VITALS — BP 124/70 | HR 62 | Temp 97.8°F | Resp 20

## 2016-10-23 DIAGNOSIS — D509 Iron deficiency anemia, unspecified: Secondary | ICD-10-CM

## 2016-10-23 MED ORDER — SODIUM CHLORIDE 0.9 % IV SOLN
510.0000 mg | Freq: Once | INTRAVENOUS | Status: AC
  Administered 2016-10-23: 510 mg via INTRAVENOUS
  Filled 2016-10-23: qty 17

## 2016-10-23 MED ORDER — SODIUM CHLORIDE 0.9 % IV SOLN
Freq: Once | INTRAVENOUS | Status: AC
  Administered 2016-10-23: 14:00:00 via INTRAVENOUS
  Filled 2016-10-23: qty 1000

## 2016-10-29 ENCOUNTER — Ambulatory Visit (INDEPENDENT_AMBULATORY_CARE_PROVIDER_SITE_OTHER): Payer: Medicare Other | Admitting: General Surgery

## 2016-10-29 ENCOUNTER — Encounter: Payer: Self-pay | Admitting: General Surgery

## 2016-10-29 VITALS — BP 124/72 | HR 68 | Resp 14 | Ht 59.0 in | Wt 154.0 lb

## 2016-10-29 DIAGNOSIS — L02415 Cutaneous abscess of right lower limb: Secondary | ICD-10-CM

## 2016-10-29 NOTE — Progress Notes (Signed)
Patient ID: [name], female   DOB: August 08, 1932, 83 y.o.   MRN: [id number]  Chief Complaint  Patient presents with  . Other    HPI [name] is a 83 y.o. femalehere today for a evaluation of a recurrent gluteal abscess. Patient noticed this area about two weeks ago. The area is red and very swollen.  She has fluid collection on right side from prior back surgeries. This fluid collection had extended over the right iliac crst and presented as a large gluteal hematoma. It was drained few times. Initial culture grew no organisms but subsequent culture grew bacillus. Pt was seen by ID-Dr. Ola Spurr and pt had a course of antibiotic via PICC line HPI  Past Medical History:  Diagnosis Date  . Anemia   . Cancer [facility]) 2016   bladder tumor  . Carotid arterial disease ([place]) 01/09/2014   Overview:  Minimal on screening   . Chronic low back pain   . Colon polyp   . DDD (degenerative disc disease), cervical   . DDD (degenerative disc disease), lumbar   . Elevated lipids   . Foot pain   . Glaucoma   . Hypertension   . Multiple gastric ulcers   . Osteoporosis     Past Surgical History:  Procedure Laterality Date  . ABDOMINAL HYSTERECTOMY  1973  . BACK SURGERY     03/1975-ruptured disk, 11/1983-ruptured disk, 11/1984-ruptured disk, 10/1996-ruptured disk, 08/1997-spinal fusion, 02/10/07-spinal fusion [facility]  . CARPAL TUNNEL RELEASE Right   . CATARACT EXTRACTION Bilateral   . CYSTOSCOPY W/ RETROGRADES Bilateral 09/29/2014   Procedure: CYSTOSCOPY WITH RETROGRADE PYELOGRAM;  Surgeon: Hollice Espy, MD;  Location: [facility] ORS;  Service: Urology;  Laterality: Bilateral;  . CYSTOSCOPY WITH BIOPSY N/A 11/02/2014   Procedure: CYSTOSCOPY WITH BIOPSY/WITH MITOMYCIN;  Surgeon: Hollice Espy, MD;  Location: [facility] ORS;  Service: Urology;  Laterality: N/A;  . ESOPHAGOGASTRODUODENOSCOPY (EGD) WITH PROPOFOL N/A 05/10/2015   Procedure: ESOPHAGOGASTRODUODENOSCOPY (EGD) WITH PROPOFOL;  Surgeon:  Lollie Sails, MD;  Location: [facility] ENDOSCOPY;  Service: Endoscopy;  Laterality: N/A;  . EYE SURGERY    . FOOT SURGERY Right   . IRRIGATION AND DEBRIDEMENT BUTTOCKS Right 10/21/2015   Procedure: DRAINAGE GLUTEAL ABSCESS;  Surgeon: Christene Lye, MD;  Location: [facility] ORS;  Service: General;  Laterality: Right;  . KNEE ARTHROSCOPY Right    x2  . MANDIBLE SURGERY Bilateral    x2  . SHOULDER ARTHROSCOPY WITH ROTATOR CUFF REPAIR AND SUBACROMIAL DECOMPRESSION Right 05/25/2015   Procedure: SHOULDER ARTHROSCOPY WITH ROTATOR CUFF REPAIR AND SUBACROMIAL DECOMPRESSION, release long head biceps tendon;  Surgeon: Leanor Kail, MD;  Location: [facility] ORS;  Service: Orthopedics;  Laterality: Right;  . TRANSURETHRAL RESECTION OF BLADDER TUMOR N/A 09/29/2014   Procedure: TRANSURETHRAL RESECTION OF BLADDER TUMOR (TURBT);  Surgeon: Hollice Espy, MD;  Location: [facility] ORS;  Service: Urology;  Laterality: N/A;  . WRIST FRACTURE SURGERY Left     Family History  Problem Relation Age of Onset  . Heart attack Father   . Emphysema Father   . Heart disease Mother   . Hypertension Mother   . Diabetes type II Sister   . Kidney disease Neg Hx   . Bladder Cancer Neg Hx   . Breast cancer Neg Hx     Social History Social History  Substance Use Topics  . Smoking status: Never Smoker  . Smokeless tobacco: Never Used  . Alcohol use No    No Known Allergies  Current Outpatient Prescriptions  Medication  Sig Dispense Refill  . carvedilol (COREG) 25 MG tablet Take 25 mg by mouth 2 (two) times daily with a meal.    . diltiazem (CARDIZEM CD) 240 MG 24 hr capsule Take 240 mg by mouth daily.    [name] latanoprost (XALATAN) 0.005 % ophthalmic solution Place 1 drop into both eyes at bedtime.    . pravastatin (PRAVACHOL) 40 MG tablet Take 40 mg by mouth daily at 6 PM.    . raloxifene (EVISTA) 60 MG tablet Take 60 mg by mouth daily.    [name] torsemide (DEMADEX) 20 MG tablet Take 20 mg by mouth as needed.     Current  Facility-Administered Medications  Medication Dose Route Frequency Provider Last Rate Last Dose  . BCG vaccine (THERACYS) injection 81 mg  81 mg Bladder Instillation Once Roda Shutters, FNP      . sodium chloride flush (NS) 0.9 % injection 50 mL  50 mL Intracatheter Once McGowan, Hunt Oris, PA-C        Review of Systems Review of Systems  Constitutional: Negative.   Respiratory: Negative.   Cardiovascular: Negative.     Blood pressure 124/72, pulse 68, resp. rate 14, height 4\' 11"  (1.499 m), weight 154 lb (69.9 kg).  Physical Exam Physical Exam  Constitutional: She is oriented to person, place, and time. She appears well-developed and well-nourished.  Neurological: She is alert and oriented to person, place, and time.  Skin: Skin is dry.       Data Reviewed Prior notes reviewed.  Assessment     Recurring right gluteal abscess- abscess was incised and drained with a needle. Large amounts of thick, brown pus was drained from site of abscess. Fluid was sent for culture. Wound was left open and dressed with 4x4 guaze. Will contact Dr. Ola Spurr to discuss oral antibiotics options for patient.    Plan    Follow up in office in about 2 weeks. Patient will be referred back to Dr. Ola Spurr for reevaluation.  HPI, Physical Exam, Assessment and Plan have been scribed under the direction and in the presence of Mckinley Jewel, MD  Gaspar Cola, CMA  I have completed the exam and reviewed the above documentation for accuracy and completeness.  I agree with the above.  Haematologist has been used and any errors in dictation or transcription are unintentional.  Alfonzo Arca G. Jamal Collin, M.D., F.A.C.S.       Junie Panning G 10/29/2016, 2:58 PM

## 2016-10-29 NOTE — Patient Instructions (Signed)
Follow up in office in about 2 weeks. Patient will be referred back to Dr. Ola Spurr for reevaluation.

## 2016-10-30 ENCOUNTER — Telehealth: Payer: Self-pay

## 2016-10-30 NOTE — Telephone Encounter (Signed)
Patient called and she is scheduled to see Dr Ola Spurr on 10/31/16 at 8:30 am for her abscess.

## 2016-11-02 LAB — ANAEROBIC AND AEROBIC CULTURE

## 2016-11-09 ENCOUNTER — Other Ambulatory Visit: Payer: Self-pay | Admitting: Infectious Diseases

## 2016-11-09 DIAGNOSIS — L0231 Cutaneous abscess of buttock: Secondary | ICD-10-CM

## 2016-11-09 DIAGNOSIS — K6812 Psoas muscle abscess: Secondary | ICD-10-CM

## 2016-11-12 ENCOUNTER — Ambulatory Visit (INDEPENDENT_AMBULATORY_CARE_PROVIDER_SITE_OTHER): Payer: Medicare Other | Admitting: General Surgery

## 2016-11-12 ENCOUNTER — Encounter: Payer: Self-pay | Admitting: General Surgery

## 2016-11-12 VITALS — BP 152/74 | HR 67 | Resp 14 | Ht 59.0 in | Wt 153.0 lb

## 2016-11-12 DIAGNOSIS — L0231 Cutaneous abscess of buttock: Secondary | ICD-10-CM | POA: Diagnosis not present

## 2016-11-12 NOTE — Patient Instructions (Signed)
Advised to return as needed. Continue follow up with Dr. Ola Spurr.

## 2016-11-12 NOTE — Progress Notes (Signed)
Patient ID: [name], female   DOB: 1932/09/04, 83 y.o.   MRN: [id number]  Chief Complaint  Patient presents with  . Follow-up    right gluteal abscess    HPI [name] is a 83 y.o. female here for a follow up from right gluteal abscess. She was seen by Dr Ola Spurr for this, started on Bactrim. She is having a CT scan this Thursday 6/21.   HPI  Past Medical History:  Diagnosis Date  . Anemia   . Cancer [facility]) 2016   bladder tumor  . Carotid arterial disease ([place]) 01/09/2014   Overview:  Minimal on screening   . Chronic low back pain   . Colon polyp   . DDD (degenerative disc disease), cervical   . DDD (degenerative disc disease), lumbar   . Elevated lipids   . Foot pain   . Glaucoma   . Hypertension   . Multiple gastric ulcers   . Osteoporosis     Past Surgical History:  Procedure Laterality Date  . ABDOMINAL HYSTERECTOMY  1973  . BACK SURGERY     03/1975-ruptured disk, 11/1983-ruptured disk, 11/1984-ruptured disk, 10/1996-ruptured disk, 08/1997-spinal fusion, 02/10/07-spinal fusion [facility]  . CARPAL TUNNEL RELEASE Right   . CATARACT EXTRACTION Bilateral   . CYSTOSCOPY W/ RETROGRADES Bilateral 09/29/2014   Procedure: CYSTOSCOPY WITH RETROGRADE PYELOGRAM;  Surgeon: Hollice Espy, MD;  Location: [facility] ORS;  Service: Urology;  Laterality: Bilateral;  . CYSTOSCOPY WITH BIOPSY N/A 11/02/2014   Procedure: CYSTOSCOPY WITH BIOPSY/WITH MITOMYCIN;  Surgeon: Hollice Espy, MD;  Location: [facility] ORS;  Service: Urology;  Laterality: N/A;  . ESOPHAGOGASTRODUODENOSCOPY (EGD) WITH PROPOFOL N/A 05/10/2015   Procedure: ESOPHAGOGASTRODUODENOSCOPY (EGD) WITH PROPOFOL;  Surgeon: Lollie Sails, MD;  Location[facility] ENDOSCOPY;  Service: Endoscopy;  Laterality: N/A;  . EYE SURGERY    . FOOT SURGERY Right   . IRRIGATION AND DEBRIDEMENT BUTTOCKS Right 10/21/2015   Procedure: DRAINAGE GLUTEAL ABSCESS;  Surgeon: Christene Lye, MD;  Location: [facility] ORS;  Service: General;   Laterality: Right;  . KNEE ARTHROSCOPY Right    x2  . MANDIBLE SURGERY Bilateral    x2  . SHOULDER ARTHROSCOPY WITH ROTATOR CUFF REPAIR AND SUBACROMIAL DECOMPRESSION Right 05/25/2015   Procedure: SHOULDER ARTHROSCOPY WITH ROTATOR CUFF REPAIR AND SUBACROMIAL DECOMPRESSION, release long head biceps tendon;  Surgeon: Leanor Kail, MD;  Location: [facility] ORS;  Service: Orthopedics;  Laterality: Right;  . TRANSURETHRAL RESECTION OF BLADDER TUMOR N/A 09/29/2014   Procedure: TRANSURETHRAL RESECTION OF BLADDER TUMOR (TURBT);  Surgeon: Hollice Espy, MD;  Location: [facility] ORS;  Service: Urology;  Laterality: N/A;  . WRIST FRACTURE SURGERY Left     Family History  Problem Relation Age of Onset  . Heart attack Father   . Emphysema Father   . Heart disease Mother   . Hypertension Mother   . Diabetes type II Sister   . Kidney disease Neg Hx   . Bladder Cancer Neg Hx   . Breast cancer Neg Hx     Social History Social History  Substance Use Topics  . Smoking status: Never Smoker  . Smokeless tobacco: Never Used  . Alcohol use No    No Known Allergies  Current Outpatient Prescriptions  Medication Sig Dispense Refill  . carvedilol (COREG) 25 MG tablet Take 25 mg by mouth 2 (two) times daily with a meal.    . diltiazem (CARDIZEM CD) 240 MG 24 hr capsule Take 240 mg by mouth daily.    [name] latanoprost (XALATAN) 0.005 %  ophthalmic solution Place 1 drop into both eyes at bedtime.    . pravastatin (PRAVACHOL) 40 MG tablet Take 40 mg by mouth daily at 6 PM.    . raloxifene (EVISTA) 60 MG tablet Take 60 mg by mouth daily.    [name] sulfamethoxazole-trimethoprim (BACTRIM DS,SEPTRA DS) 800-160 MG tablet Take 1 tablet by mouth 2 (two) times daily.    [name] torsemide (DEMADEX) 20 MG tablet Take 20 mg by mouth as needed.     Current Facility-Administered Medications  Medication Dose Route Frequency Provider Last Rate Last Dose  . BCG vaccine (THERACYS) injection 81 mg  81 mg Bladder Instillation Once Roda Shutters, FNP      . sodium chloride flush (NS) 0.9 % injection 50 mL  50 mL Intracatheter Once McGowan, Hunt Oris, PA-C        Review of Systems Review of Systems  Constitutional: Negative.   Respiratory: Negative.   Cardiovascular: Negative.     Blood pressure (!) 152/74, pulse 67, resp. rate 14, height '4\' 11"'  (1.499 m), weight 153 lb (69.4 kg).  Physical Exam Physical Exam  Constitutional: She is oriented to person, place, and time. She appears well-developed and well-nourished.  Neurological: She is alert and oriented to person, place, and time.  Skin: Skin is warm.     Psychiatric: She has a normal mood and affect. Her behavior is normal.    Data Reviewed Dr. Blane Ohara notes and labs reviewed Culture from abscess grew  Normal skin floa. CRP and ESR are both elevated Assessment    History of recurring gluteal abscess from large seroma- patient likely will need long term abx care.    Plan   No surgical intervention required at present Advised to return as needed. Continue follow up with Dr. Ola Spurr.     HPI, Physical Exam, Assessment and Plan have been scribed under the direction and in the presence of Mckinley Jewel, MD  Concepcion Living, LPN  I have completed the exam and reviewed the above documentation for accuracy and completeness.  I agree with the above.  Haematologist has been used and any errors in dictation or transcription are unintentional.  Mckinna Demars G. Jamal Collin, M.D., F.A.C.S.   Junie Panning G 11/12/2016, 1:21 PM

## 2016-11-15 ENCOUNTER — Ambulatory Visit
Admission: RE | Admit: 2016-11-15 | Discharge: 2016-11-15 | Disposition: A | Discharge status: Home / Self Care | Payer: Medicare Other | Source: Ambulatory Visit | Attending: Infectious Diseases | Admitting: Infectious Diseases

## 2016-11-15 DIAGNOSIS — L0231 Cutaneous abscess of buttock: Secondary | ICD-10-CM | POA: Diagnosis present

## 2016-11-15 DIAGNOSIS — I700 Atherosclerosis of aorta: Secondary | ICD-10-CM | POA: Insufficient documentation

## 2016-11-15 DIAGNOSIS — K6812 Psoas muscle abscess: Secondary | ICD-10-CM | POA: Diagnosis present

## 2016-11-15 MED ORDER — IOPAMIDOL (ISOVUE-300) INJECTION 61%
100.0000 mL | Freq: Once | INTRAVENOUS | Status: AC | PRN
  Administered 2016-11-15: 100 mL via INTRAVENOUS

## 2016-11-16 ENCOUNTER — Encounter: Payer: Self-pay | Admitting: General Surgery

## 2016-11-16 ENCOUNTER — Ambulatory Visit (INDEPENDENT_AMBULATORY_CARE_PROVIDER_SITE_OTHER): Payer: Medicare Other | Admitting: General Surgery

## 2016-11-16 VITALS — BP 118/60 | HR 74 | Resp 14 | Ht 59.0 in | Wt 149.0 lb

## 2016-11-16 DIAGNOSIS — L0231 Cutaneous abscess of buttock: Secondary | ICD-10-CM

## 2016-11-16 NOTE — Progress Notes (Signed)
The patient is scheduled for surgery at [facility] on 11/20/16. She will pre admit at the hospital on 11/19/16 at 11:00 am. The patient is aware of dates, time, and instructions.

## 2016-11-16 NOTE — Progress Notes (Signed)
Patient ID: [name], female   DOB: 12-01-1932, 83 y.o.   MRN: [id number]  Chief Complaint  Patient presents with  . Follow-up    HPI [name] is a 83 y.o. female here for a follow up from right gluteal abscess. She was seen by Dr Ola Spurr for this, started on Bactrim. She had CT scan this Thursday.  Dr. Ola Spurr has requested that I review the CT and decide if she needs an open drainage. HPI  Past Medical History:  Diagnosis Date  . Anemia   . Cancer [facility]) 2016   bladder tumor  . Carotid arterial disease ([place]) 01/09/2014   Overview:  Minimal on screening   . Chronic low back pain   . Colon polyp   . DDD (degenerative disc disease), cervical   . DDD (degenerative disc disease), lumbar   . Elevated lipids   . Foot pain   . Glaucoma   . Hypertension   . Multiple gastric ulcers   . Osteoporosis     Past Surgical History:  Procedure Laterality Date  . ABDOMINAL HYSTERECTOMY  1973  . BACK SURGERY     03/1975-ruptured disk, 11/1983-ruptured disk, 11/1984-ruptured disk, 10/1996-ruptured disk, 08/1997-spinal fusion, 02/10/07-spinal fusion [facility]  . CARPAL TUNNEL RELEASE Right   . CATARACT EXTRACTION Bilateral   . CYSTOSCOPY W/ RETROGRADES Bilateral 09/29/2014   Procedure: CYSTOSCOPY WITH RETROGRADE PYELOGRAM;  Surgeon: Hollice Espy, MD;  Location: [facility] ORS;  Service: Urology;  Laterality: Bilateral;  . CYSTOSCOPY WITH BIOPSY N/A 11/02/2014   Procedure: CYSTOSCOPY WITH BIOPSY/WITH MITOMYCIN;  Surgeon: Hollice Espy, MD;  Location: [facility] ORS;  Service: Urology;  Laterality: N/A;  . ESOPHAGOGASTRODUODENOSCOPY (EGD) WITH PROPOFOL N/A 05/10/2015   Procedure: ESOPHAGOGASTRODUODENOSCOPY (EGD) WITH PROPOFOL;  Surgeon: Lollie Sails, MD;  Location: [facility] ENDOSCOPY;  Service: Endoscopy;  Laterality: N/A;  . EYE SURGERY    . FOOT SURGERY Right   . IRRIGATION AND DEBRIDEMENT BUTTOCKS Right 10/21/2015   Procedure: DRAINAGE GLUTEAL ABSCESS;  Surgeon: Christene Lye, MD;  Location: [facility] ORS;  Service: General;  Laterality: Right;  . KNEE ARTHROSCOPY Right    x2  . MANDIBLE SURGERY Bilateral    x2  . SHOULDER ARTHROSCOPY WITH ROTATOR CUFF REPAIR AND SUBACROMIAL DECOMPRESSION Right 05/25/2015   Procedure: SHOULDER ARTHROSCOPY WITH ROTATOR CUFF REPAIR AND SUBACROMIAL DECOMPRESSION, release long head biceps tendon;  Surgeon: Leanor Kail, MD;  Location: [facility] ORS;  Service: Orthopedics;  Laterality: Right;  . TRANSURETHRAL RESECTION OF BLADDER TUMOR N/A 09/29/2014   Procedure: TRANSURETHRAL RESECTION OF BLADDER TUMOR (TURBT);  Surgeon: Hollice Espy, MD;  Location: [facility] ORS;  Service: Urology;  Laterality: N/A;  . WRIST FRACTURE SURGERY Left     Family History  Problem Relation Age of Onset  . Heart attack Father   . Emphysema Father   . Heart disease Mother   . Hypertension Mother   . Diabetes type II Sister   . Kidney disease Neg Hx   . Bladder Cancer Neg Hx   . Breast cancer Neg Hx     Social History Social History  Substance Use Topics  . Smoking status: Never Smoker  . Smokeless tobacco: Never Used  . Alcohol use No    No Known Allergies  Current Outpatient Prescriptions  Medication Sig Dispense Refill  . carvedilol (COREG) 25 MG tablet Take 25 mg by mouth 2 (two) times daily with a meal.    . diltiazem (CARDIZEM CD) 240 MG 24 hr capsule Take 240 mg by mouth daily.    [name]  latanoprost (XALATAN) 0.005 % ophthalmic solution Place 1 drop into both eyes at bedtime.    . pravastatin (PRAVACHOL) 40 MG tablet Take 40 mg by mouth daily at 6 PM.    . raloxifene (EVISTA) 60 MG tablet Take 60 mg by mouth daily.    [name] sulfamethoxazole-trimethoprim (BACTRIM DS,SEPTRA DS) 800-160 MG tablet Take 1 tablet by mouth 2 (two) times daily.    [name] torsemide (DEMADEX) 20 MG tablet Take 20 mg by mouth as needed.     Current Facility-Administered Medications  Medication Dose Route Frequency Provider Last Rate Last Dose  . BCG vaccine (THERACYS) injection  81 mg  81 mg Bladder Instillation Once Roda Shutters, FNP      . sodium chloride flush (NS) 0.9 % injection 50 mL  50 mL Intracatheter Once McGowan, Hunt Oris, PA-C        Review of Systems Review of Systems  Constitutional: Negative.   Respiratory: Negative.   Cardiovascular: Negative.     Blood pressure 118/60, pulse 74, resp. rate 14, height 4\' 11"  (1.499 m), weight 149 lb (67.6 kg).  Physical Exam Physical Exam  Constitutional: She is oriented to person, place, and time. She appears well-developed and well-nourished.  Eyes: Conjunctivae are normal. No scleral icterus.  Neck: Neck supple.  Cardiovascular: Normal rate and regular rhythm.   Murmur heard.  Systolic murmur is present with a grade of 1/6  Pulmonary/Chest: Effort normal and breath sounds normal.  Abdominal: Soft. There is no tenderness.  Lymphadenopathy:    She has no cervical adenopathy.       Right: No inguinal adenopathy present.  Neurological: She is alert and oriented to person, place, and time.  Skin: Skin is warm and dry.       Data Reviewed Prior notes CT scan was reviewed showing Presence of a subcutaneous pocket smudges 10 cm in longest length. This seems to extend up to the iliac crest but no continuation overlying the crest towards the back muscles was identified. Assessment    Recurring gluteal collection of fluid with infection at least on 2 different occasions. Likely the patient has a aligning that has failed to seal and continues to drain fluid which subsequently gets infected. Simple drainage with the pigtail catheter may not be sufficient as this is already been done before. This was discussed in full with Dr. Ola Spurr and the recommendation made to the patient today for an open drainage with the cleansing and debridement of the cavity    Plan   Patient was agreeable to the recommendation for open drainage and debridement of the tissue within the pocket. Also advised that the if felt  reasonable a wound VAC may be applied for subsequent healing of this area. Otherwise she will have a Blake drain which she is familiar with.     HPI, Physical Exam, Assessment and Plan have been scribed under the direction and in the presence of Mckinley Jewel, MD  Gaspar Cola, CMA  I have completed the exam and reviewed the above documentation for accuracy and completeness.  I agree with the above.  Haematologist has been used and any errors in dictation or transcription are unintentional.  Misha Vanoverbeke G. Jamal Collin, M.D., F.A.C.S.   Junie Panning G 11/16/2016, 11:37 AM

## 2016-11-19 ENCOUNTER — Encounter
Admission: RE | Admit: 2016-11-19 | Discharge: 2016-11-19 | Disposition: A | Discharge status: Home / Self Care | Payer: Medicare Other | Source: Ambulatory Visit | Attending: General Surgery | Admitting: General Surgery

## 2016-11-19 DIAGNOSIS — H409 Unspecified glaucoma: Secondary | ICD-10-CM | POA: Diagnosis not present

## 2016-11-19 DIAGNOSIS — I10 Essential (primary) hypertension: Secondary | ICD-10-CM | POA: Diagnosis not present

## 2016-11-19 DIAGNOSIS — L0231 Cutaneous abscess of buttock: Secondary | ICD-10-CM | POA: Diagnosis present

## 2016-11-19 DIAGNOSIS — Z79899 Other long term (current) drug therapy: Secondary | ICD-10-CM | POA: Diagnosis not present

## 2016-11-19 DIAGNOSIS — Z8249 Family history of ischemic heart disease and other diseases of the circulatory system: Secondary | ICD-10-CM | POA: Diagnosis not present

## 2016-11-19 DIAGNOSIS — M5030 Other cervical disc degeneration, unspecified cervical region: Secondary | ICD-10-CM | POA: Diagnosis not present

## 2016-11-19 DIAGNOSIS — M1990 Unspecified osteoarthritis, unspecified site: Secondary | ICD-10-CM | POA: Diagnosis not present

## 2016-11-19 DIAGNOSIS — M810 Age-related osteoporosis without current pathological fracture: Secondary | ICD-10-CM | POA: Diagnosis not present

## 2016-11-19 DIAGNOSIS — M5136 Other intervertebral disc degeneration, lumbar region: Secondary | ICD-10-CM | POA: Diagnosis not present

## 2016-11-19 DIAGNOSIS — Z90710 Acquired absence of both cervix and uterus: Secondary | ICD-10-CM | POA: Diagnosis not present

## 2016-11-19 HISTORY — DX: Unspecified hearing loss, unspecified ear: H91.90

## 2016-11-19 HISTORY — DX: Personal history of traumatic brain injury: Z87.820

## 2016-11-19 HISTORY — DX: Cardiac murmur, unspecified: R01.1

## 2016-11-19 HISTORY — DX: Disorder of thyroid, unspecified: E07.9

## 2016-11-19 LAB — CBC
HEMATOCRIT: 29 % — AB (ref 35.0–47.0)
HEMOGLOBIN: 9.5 g/dL — AB (ref 12.0–16.0)
MCH: 29.5 pg (ref 26.0–34.0)
MCHC: 32.8 g/dL (ref 32.0–36.0)
MCV: 90 fL (ref 80.0–100.0)
Platelets: 321 10*3/uL (ref 150–440)
RBC: 3.22 MIL/uL — ABNORMAL LOW (ref 3.80–5.20)
RDW: 16.8 % — ABNORMAL HIGH (ref 11.5–14.5)
WBC: 8.2 10*3/uL (ref 3.6–11.0)

## 2016-11-19 LAB — POTASSIUM: Potassium: 4.7 mmol/L (ref 3.5–5.1)

## 2016-11-19 MED ORDER — CEFAZOLIN SODIUM-DEXTROSE 2-4 GM/100ML-% IV SOLN
2.0000 g | INTRAVENOUS | Status: AC
  Administered 2016-11-20: 2 g via INTRAVENOUS

## 2016-11-19 NOTE — Patient Instructions (Addendum)
  Your procedure is scheduled on: November 20, 2016 [facility]) Report to Same Day Surgery 2nd floor medical mall ([place] Entrance-take elevator on left to 2nd floor.  Check in with surgery information desk.) To find out your arrival time please call [phone] between 1PM - 3PM on TODAY, June  25  Remember: Instructions that are not followed completely may result in serious medical risk, up to and including death, or upon the discretion of your surgeon and anesthesiologist your surgery may need to be rescheduled.    _x___ 1. Do not eat food or drink liquids after midnight. No gum chewing or hard candies                               __x__ 2. No Alcohol for 24 hours before or after surgery.   __x__3. No Smoking for 24 prior to surgery.   ____  4. Bring all medications with you on the day of surgery if instructed.    __x__ 5. Notify your doctor if there is any change in your medical condition     (cold, fever, infections).     Do not wear jewelry, make-up, hairpins, clips or nail polish.  Do not wear lotions, powders, or perfumes.  Do not shave 48 hours prior to surgery. Men may shave face and neck.  Do not bring valuables to the hospital.    [facility] is not responsible for any belongings or valuables.               Contacts, dentures or bridgework may not be worn into surgery.   Leave your suitcase in the car. After surgery it may be brought to your room.treatment team.     For patients admitted to the hospital, discharge time is determined by your                       Patients discharged the day of surgery will not be allowed to drive home.  You will need someone to drive you home and stay with you the night of your procedure.    Please read over the following fact sheets that you were given:   [facility] Preparing for Surgery and or MRSA Information   _x___ Take the following medications with a sip of water the morning of surgery :  1. CARVEDILOL  2. CARTIA   XT  3.  4.  5.  6.  ____Fleets enema or Magnesium Citrate as directed.   _x___ Use CHG Soap or sage wipes as directed on instruction sheet   ____ Use inhalers on the day of surgery and bring to hospital day of surgery  ____ Stop Metformin and Janumet 2 days prior to surgery.    ____ Take 1/2 of usual insulin dose the night before surgery and none on the morning surgery    _x___ Follow recommendations from Cardiologist, Pulmonologist or PCP regarding          stopping Aspirin, Coumadin, Plavix ,Eliquis, Effient, or Pradaxa, and Pletal.  X____Stop Anti-inflammatories such as Advil, Aleve, Ibuprofen, Motrin, Naproxen, Naprosyn, Goodies powders or aspirin products  NOW.OK to take Tylenol if needed   _x___ Stop supplements until after surgery.  But may continue Vitamin D, Vitamin B,and multivitamin        ____ Bring C-Pap to the hospital.

## 2016-11-20 ENCOUNTER — Ambulatory Visit
Admission: RE | Admit: 2016-11-20 | Discharge: 2016-11-20 | Disposition: A | Discharge status: Home / Self Care | Payer: Medicare Other | Source: Ambulatory Visit | Attending: General Surgery | Admitting: General Surgery

## 2016-11-20 ENCOUNTER — Ambulatory Visit: Payer: Medicare Other | Admitting: Certified Registered Nurse Anesthetist

## 2016-11-20 ENCOUNTER — Encounter
Admission: RE | Disposition: A | Discharge status: Home / Self Care | Payer: Self-pay | Source: Ambulatory Visit | Attending: General Surgery

## 2016-11-20 ENCOUNTER — Encounter: Payer: Self-pay | Admitting: *Deleted

## 2016-11-20 DIAGNOSIS — I10 Essential (primary) hypertension: Secondary | ICD-10-CM | POA: Insufficient documentation

## 2016-11-20 DIAGNOSIS — M5136 Other intervertebral disc degeneration, lumbar region: Secondary | ICD-10-CM | POA: Insufficient documentation

## 2016-11-20 DIAGNOSIS — L0231 Cutaneous abscess of buttock: Secondary | ICD-10-CM

## 2016-11-20 DIAGNOSIS — Z79899 Other long term (current) drug therapy: Secondary | ICD-10-CM | POA: Insufficient documentation

## 2016-11-20 DIAGNOSIS — Z8249 Family history of ischemic heart disease and other diseases of the circulatory system: Secondary | ICD-10-CM | POA: Insufficient documentation

## 2016-11-20 DIAGNOSIS — H409 Unspecified glaucoma: Secondary | ICD-10-CM | POA: Insufficient documentation

## 2016-11-20 DIAGNOSIS — Z90710 Acquired absence of both cervix and uterus: Secondary | ICD-10-CM | POA: Insufficient documentation

## 2016-11-20 DIAGNOSIS — M5030 Other cervical disc degeneration, unspecified cervical region: Secondary | ICD-10-CM | POA: Insufficient documentation

## 2016-11-20 DIAGNOSIS — M1990 Unspecified osteoarthritis, unspecified site: Secondary | ICD-10-CM | POA: Insufficient documentation

## 2016-11-20 DIAGNOSIS — M810 Age-related osteoporosis without current pathological fracture: Secondary | ICD-10-CM | POA: Insufficient documentation

## 2016-11-20 HISTORY — PX: INCISION AND DRAINAGE ABSCESS: SHX5864

## 2016-11-20 SURGERY — INCISION AND DRAINAGE, ABSCESS
Anesthesia: General | Laterality: Right | Wound class: Dirty or Infected

## 2016-11-20 MED ORDER — LACTATED RINGERS IV SOLN
INTRAVENOUS | Status: DC
  Administered 2016-11-20: 25 mL/h via INTRAVENOUS

## 2016-11-20 MED ORDER — PROPOFOL 10 MG/ML IV BOLUS
INTRAVENOUS | Status: DC | PRN
  Administered 2016-11-20: 80 mg via INTRAVENOUS
  Administered 2016-11-20: 20 mg via INTRAVENOUS

## 2016-11-20 MED ORDER — LIDOCAINE HCL (PF) 1 % IJ SOLN
INTRAMUSCULAR | Status: AC
  Filled 2016-11-20: qty 30

## 2016-11-20 MED ORDER — EPHEDRINE SULFATE 50 MG/ML IJ SOLN
INTRAMUSCULAR | Status: AC
  Filled 2016-11-20: qty 1

## 2016-11-20 MED ORDER — FENTANYL CITRATE (PF) 100 MCG/2ML IJ SOLN
25.0000 ug | INTRAMUSCULAR | Status: DC | PRN
  Administered 2016-11-20 (×2): 25 ug via INTRAVENOUS

## 2016-11-20 MED ORDER — DEXAMETHASONE SODIUM PHOSPHATE 10 MG/ML IJ SOLN
INTRAMUSCULAR | Status: AC
  Filled 2016-11-20: qty 1

## 2016-11-20 MED ORDER — SODIUM BICARBONATE 4 % IV SOLN
INTRAVENOUS | Status: AC
  Filled 2016-11-20: qty 5

## 2016-11-20 MED ORDER — LIDOCAINE HCL (CARDIAC) 20 MG/ML IV SOLN
INTRAVENOUS | Status: DC | PRN
  Administered 2016-11-20: 50 mg via INTRAVENOUS

## 2016-11-20 MED ORDER — ROCURONIUM BROMIDE 50 MG/5ML IV SOLN
INTRAVENOUS | Status: AC
  Filled 2016-11-20: qty 1

## 2016-11-20 MED ORDER — FAMOTIDINE 20 MG PO TABS
20.0000 mg | ORAL_TABLET | Freq: Once | ORAL | Status: AC
  Administered 2016-11-20: 20 mg via ORAL

## 2016-11-20 MED ORDER — FENTANYL CITRATE (PF) 100 MCG/2ML IJ SOLN
INTRAMUSCULAR | Status: AC
  Administered 2016-11-20: 25 ug via INTRAVENOUS
  Filled 2016-11-20: qty 2

## 2016-11-20 MED ORDER — TRAMADOL HCL 50 MG PO TABS
ORAL_TABLET | ORAL | Status: AC
  Filled 2016-11-20: qty 1

## 2016-11-20 MED ORDER — FENTANYL CITRATE (PF) 100 MCG/2ML IJ SOLN
INTRAMUSCULAR | Status: DC | PRN
  Administered 2016-11-20 (×2): 25 ug via INTRAVENOUS

## 2016-11-20 MED ORDER — TRAMADOL HCL 50 MG PO TABS: 50.0000 mg | ORAL_TABLET | Freq: Four times a day (QID) | ORAL | 0 refills | Status: DC | PRN

## 2016-11-20 MED ORDER — ONDANSETRON HCL 4 MG/2ML IJ SOLN
INTRAMUSCULAR | Status: AC
  Filled 2016-11-20: qty 2

## 2016-11-20 MED ORDER — DEXAMETHASONE SODIUM PHOSPHATE 10 MG/ML IJ SOLN
INTRAMUSCULAR | Status: DC | PRN
  Administered 2016-11-20: 4 mg via INTRAVENOUS

## 2016-11-20 MED ORDER — BACITRACIN-NEOMYCIN-POLYMYXIN 400-5-5000 EX OINT
TOPICAL_OINTMENT | CUTANEOUS | Status: AC
  Filled 2016-11-20: qty 1

## 2016-11-20 MED ORDER — FAMOTIDINE 20 MG PO TABS
ORAL_TABLET | ORAL | Status: AC
  Administered 2016-11-20: 20 mg via ORAL
  Filled 2016-11-20: qty 1

## 2016-11-20 MED ORDER — ONDANSETRON HCL 4 MG/2ML IJ SOLN
INTRAMUSCULAR | Status: DC | PRN
  Administered 2016-11-20: 4 mg via INTRAVENOUS

## 2016-11-20 MED ORDER — BACITRACIN-NEOMYCIN-POLYMYXIN 400-5-5000 EX OINT
TOPICAL_OINTMENT | CUTANEOUS | Status: DC | PRN
  Administered 2016-11-20: 1 via TOPICAL

## 2016-11-20 MED ORDER — SUGAMMADEX SODIUM 200 MG/2ML IV SOLN
INTRAVENOUS | Status: AC
  Filled 2016-11-20: qty 2

## 2016-11-20 MED ORDER — EPHEDRINE SULFATE 50 MG/ML IJ SOLN
INTRAMUSCULAR | Status: DC | PRN
  Administered 2016-11-20: 10 mg via INTRAVENOUS

## 2016-11-20 MED ORDER — TRAMADOL HCL 50 MG PO TABS
50.0000 mg | ORAL_TABLET | Freq: Four times a day (QID) | ORAL | Status: DC | PRN
  Administered 2016-11-20: 50 mg via ORAL
  Filled 2016-11-20: qty 1

## 2016-11-20 MED ORDER — LIDOCAINE HCL (PF) 2 % IJ SOLN
INTRAMUSCULAR | Status: AC
  Filled 2016-11-20: qty 2

## 2016-11-20 MED ORDER — CHLORHEXIDINE GLUCONATE CLOTH 2 % EX PADS: 6.0000 | MEDICATED_PAD | Freq: Once | CUTANEOUS | Status: DC

## 2016-11-20 MED ORDER — BUPIVACAINE HCL (PF) 0.5 % IJ SOLN
INTRAMUSCULAR | Status: AC
  Filled 2016-11-20: qty 30

## 2016-11-20 MED ORDER — SUCCINYLCHOLINE CHLORIDE 20 MG/ML IJ SOLN
INTRAMUSCULAR | Status: DC | PRN
  Administered 2016-11-20: 80 mg via INTRAVENOUS

## 2016-11-20 MED ORDER — FENTANYL CITRATE (PF) 100 MCG/2ML IJ SOLN
INTRAMUSCULAR | Status: AC
  Filled 2016-11-20: qty 2

## 2016-11-20 MED ORDER — CEFAZOLIN SODIUM-DEXTROSE 2-4 GM/100ML-% IV SOLN
INTRAVENOUS | Status: AC
  Filled 2016-11-20: qty 100

## 2016-11-20 MED ORDER — SUGAMMADEX SODIUM 200 MG/2ML IV SOLN
INTRAVENOUS | Status: DC | PRN
  Administered 2016-11-20: 140 mg via INTRAVENOUS

## 2016-11-20 MED ORDER — PROPOFOL 500 MG/50ML IV EMUL
INTRAVENOUS | Status: AC
  Filled 2016-11-20: qty 50

## 2016-11-20 MED ORDER — ROCURONIUM BROMIDE 100 MG/10ML IV SOLN
INTRAVENOUS | Status: DC | PRN
  Administered 2016-11-20: 5 mg via INTRAVENOUS

## 2016-11-20 MED ORDER — ONDANSETRON HCL 4 MG/2ML IJ SOLN: 4.0000 mg | Freq: Once | INTRAMUSCULAR | Status: DC | PRN

## 2016-11-20 SURGICAL SUPPLY — 39 items
BANDAGE ELASTIC 6 CLIP NS LF (GAUZE/BANDAGES/DRESSINGS) IMPLANT
BLADE SURG 15 STRL SS SAFETY (BLADE) ×3 IMPLANT
BNDG GAUZE 4.5X4.1 6PLY STRL (MISCELLANEOUS) IMPLANT
BULB RESERV EVAC DRAIN JP 100C (MISCELLANEOUS) ×2 IMPLANT
CANISTER SUCT 1200ML W/VALVE (MISCELLANEOUS) ×3 IMPLANT
CHLORAPREP W/TINT 26ML (MISCELLANEOUS) ×1 IMPLANT
CLOSURE WOUND 1/2 X4 (GAUZE/BANDAGES/DRESSINGS)
DRAIN CHANNEL JP 19F (MISCELLANEOUS) ×2 IMPLANT
DRAIN PENROSE 1/4X12 LTX (DRAIN) ×1 IMPLANT
DRAPE LAPAROTOMY 100X77 ABD (DRAPES) ×3 IMPLANT
DRSG TEGADERM 4X4.75 (GAUZE/BANDAGES/DRESSINGS) IMPLANT
DRSG TEGADERM 6X8 (GAUZE/BANDAGES/DRESSINGS) ×2 IMPLANT
DRSG TELFA 4X3 1S NADH ST (GAUZE/BANDAGES/DRESSINGS) IMPLANT
ELECT REM PT RETURN 9FT ADLT (ELECTROSURGICAL) ×3
ELECTRODE REM PT RTRN 9FT ADLT (ELECTROSURGICAL) ×1 IMPLANT
GAUZE SPONGE 4X4 12PLY STRL (GAUZE/BANDAGES/DRESSINGS) ×2 IMPLANT
GLOVE BIO SURGEON STRL SZ7 (GLOVE) ×9 IMPLANT
GOWN STRL REUS W/ TWL LRG LVL3 (GOWN DISPOSABLE) ×2 IMPLANT
GOWN STRL REUS W/TWL LRG LVL3 (GOWN DISPOSABLE) ×9
KIT RM TURNOVER STRD PROC AR (KITS) ×3 IMPLANT
LABEL OR SOLS (LABEL) ×3 IMPLANT
NDL SAFETY 22GX1.5 (NEEDLE) ×3 IMPLANT
NDL SAFETY 25GX1.5 (NEEDLE) ×1 IMPLANT
NS IRRIG 1000ML POUR BTL (IV SOLUTION) ×3 IMPLANT
PACK BASIN MINOR ARMC (MISCELLANEOUS) ×3 IMPLANT
SOL PREP PVP 2OZ (MISCELLANEOUS) ×3
SOLUTION PREP PVP 2OZ (MISCELLANEOUS) ×1 IMPLANT
STRIP CLOSURE SKIN 1/2X4 (GAUZE/BANDAGES/DRESSINGS) IMPLANT
SUT ETHILON 2 0 FS 18 (SUTURE) ×2 IMPLANT
SUT ETHILON 3-0 FS-10 30 BLK (SUTURE) ×3
SUT VIC AB 2-0 CT1 27 (SUTURE)
SUT VIC AB 2-0 CT1 TAPERPNT 27 (SUTURE) IMPLANT
SUT VIC AB 2-0 CT2 27 (SUTURE) IMPLANT
SUT VIC AB 4-0 FS2 27 (SUTURE) IMPLANT
SUTURE EHLN 3-0 FS-10 30 BLK (SUTURE) IMPLANT
SWAB CULTURE AMIES ANAERIB BLU (MISCELLANEOUS) ×3 IMPLANT
SWABSTK COMLB BENZOIN TINCTURE (MISCELLANEOUS) ×1 IMPLANT
SYR BULB IRRIG 60ML STRL (SYRINGE) ×2 IMPLANT
SYR CONTROL 10ML (SYRINGE) ×3 IMPLANT

## 2016-11-20 NOTE — H&P (View-Only) (Signed)
Patient ID: [name], female   DOB: 21-Dec-1932, 83 y.o.   MRN: [id number]  Chief Complaint  Patient presents with  . Follow-up    HPI [name] is a 83 y.o. female here for a follow up from right gluteal abscess. She was seen by Dr Ola Spurr for this, started on Bactrim. She had CT scan this Thursday.  Dr. Ola Spurr has requested that I review the CT and decide if she needs an open drainage. HPI  Past Medical History:  Diagnosis Date  . Anemia   . Cancer [facility]) 2016   bladder tumor  . Carotid arterial disease ([place]) 01/09/2014   Overview:  Minimal on screening   . Chronic low back pain   . Colon polyp   . DDD (degenerative disc disease), cervical   . DDD (degenerative disc disease), lumbar   . Elevated lipids   . Foot pain   . Glaucoma   . Hypertension   . Multiple gastric ulcers   . Osteoporosis     Past Surgical History:  Procedure Laterality Date  . ABDOMINAL HYSTERECTOMY  1973  . BACK SURGERY     03/1975-ruptured disk, 11/1983-ruptured disk, 11/1984-ruptured disk, 10/1996-ruptured disk, 08/1997-spinal fusion, 02/10/07-spinal fusion [facility]  . CARPAL TUNNEL RELEASE Right   . CATARACT EXTRACTION Bilateral   . CYSTOSCOPY W/ RETROGRADES Bilateral 09/29/2014   Procedure: CYSTOSCOPY WITH RETROGRADE PYELOGRAM;  Surgeon: Hollice Espy, MD;  Location: [facility] ORS;  Service: Urology;  Laterality: Bilateral;  . CYSTOSCOPY WITH BIOPSY N/A 11/02/2014   Procedure: CYSTOSCOPY WITH BIOPSY/WITH MITOMYCIN;  Surgeon: Hollice Espy, MD;  Location: [facility] ORS;  Service: Urology;  Laterality: N/A;  . ESOPHAGOGASTRODUODENOSCOPY (EGD) WITH PROPOFOL N/A 05/10/2015   Procedure: ESOPHAGOGASTRODUODENOSCOPY (EGD) WITH PROPOFOL;  Surgeon: Lollie Sails, MD;  Location: [facility] ENDOSCOPY;  Service: Endoscopy;  Laterality: N/A;  . EYE SURGERY    . FOOT SURGERY Right   . IRRIGATION AND DEBRIDEMENT BUTTOCKS Right 10/21/2015   Procedure: DRAINAGE GLUTEAL ABSCESS;  Surgeon: Christene Lye, MD;  Location: [facility] ORS;  Service: General;  Laterality: Right;  . KNEE ARTHROSCOPY Right    x2  . MANDIBLE SURGERY Bilateral    x2  . SHOULDER ARTHROSCOPY WITH ROTATOR CUFF REPAIR AND SUBACROMIAL DECOMPRESSION Right 05/25/2015   Procedure: SHOULDER ARTHROSCOPY WITH ROTATOR CUFF REPAIR AND SUBACROMIAL DECOMPRESSION, release long head biceps tendon;  Surgeon: Leanor Kail, MD;  Location: [facility] ORS;  Service: Orthopedics;  Laterality: Right;  . TRANSURETHRAL RESECTION OF BLADDER TUMOR N/A 09/29/2014   Procedure: TRANSURETHRAL RESECTION OF BLADDER TUMOR (TURBT);  Surgeon: Hollice Espy, MD;  Location: [facility] ORS;  Service: Urology;  Laterality: N/A;  . WRIST FRACTURE SURGERY Left     Family History  Problem Relation Age of Onset  . Heart attack Father   . Emphysema Father   . Heart disease Mother   . Hypertension Mother   . Diabetes type II Sister   . Kidney disease Neg Hx   . Bladder Cancer Neg Hx   . Breast cancer Neg Hx     Social History Social History  Substance Use Topics  . Smoking status: Never Smoker  . Smokeless tobacco: Never Used  . Alcohol use No    No Known Allergies  Current Outpatient Prescriptions  Medication Sig Dispense Refill  . carvedilol (COREG) 25 MG tablet Take 25 mg by mouth 2 (two) times daily with a meal.    . diltiazem (CARDIZEM CD) 240 MG 24 hr capsule Take 240 mg by mouth daily.    [name]  latanoprost (XALATAN) 0.005 % ophthalmic solution Place 1 drop into both eyes at bedtime.    . pravastatin (PRAVACHOL) 40 MG tablet Take 40 mg by mouth daily at 6 PM.    . raloxifene (EVISTA) 60 MG tablet Take 60 mg by mouth daily.    [name] sulfamethoxazole-trimethoprim (BACTRIM DS,SEPTRA DS) 800-160 MG tablet Take 1 tablet by mouth 2 (two) times daily.    [name] torsemide (DEMADEX) 20 MG tablet Take 20 mg by mouth as needed.     Current Facility-Administered Medications  Medication Dose Route Frequency Provider Last Rate Last Dose  . BCG vaccine (THERACYS) injection  81 mg  81 mg Bladder Instillation Once Roda Shutters, FNP      . sodium chloride flush (NS) 0.9 % injection 50 mL  50 mL Intracatheter Once McGowan, Hunt Oris, PA-C        Review of Systems Review of Systems  Constitutional: Negative.   Respiratory: Negative.   Cardiovascular: Negative.     Blood pressure 118/60, pulse 74, resp. rate 14, height 4\' 11"  (1.499 m), weight 149 lb (67.6 kg).  Physical Exam Physical Exam  Constitutional: She is oriented to person, place, and time. She appears well-developed and well-nourished.  Eyes: Conjunctivae are normal. No scleral icterus.  Neck: Neck supple.  Cardiovascular: Normal rate and regular rhythm.   Murmur heard.  Systolic murmur is present with a grade of 1/6  Pulmonary/Chest: Effort normal and breath sounds normal.  Abdominal: Soft. There is no tenderness.  Lymphadenopathy:    She has no cervical adenopathy.       Right: No inguinal adenopathy present.  Neurological: She is alert and oriented to person, place, and time.  Skin: Skin is warm and dry.       Data Reviewed Prior notes CT scan was reviewed showing Presence of a subcutaneous pocket smudges 10 cm in longest length. This seems to extend up to the iliac crest but no continuation overlying the crest towards the back muscles was identified. Assessment    Recurring gluteal collection of fluid with infection at least on 2 different occasions. Likely the patient has a aligning that has failed to seal and continues to drain fluid which subsequently gets infected. Simple drainage with the pigtail catheter may not be sufficient as this is already been done before. This was discussed in full with Dr. Ola Spurr and the recommendation made to the patient today for an open drainage with the cleansing and debridement of the cavity    Plan   Patient was agreeable to the recommendation for open drainage and debridement of the tissue within the pocket. Also advised that the if felt  reasonable a wound VAC may be applied for subsequent healing of this area. Otherwise she will have a Blake drain which she is familiar with.     HPI, Physical Exam, Assessment and Plan have been scribed under the direction and in the presence of Mckinley Jewel, MD  Gaspar Cola, CMA  I have completed the exam and reviewed the above documentation for accuracy and completeness.  I agree with the above.  Haematologist has been used and any errors in dictation or transcription are unintentional.  Lurena Naeve G. Jamal Collin, M.D., F.A.C.S.   Junie Panning G 11/16/2016, 11:37 AM

## 2016-11-20 NOTE — Anesthesia Procedure Notes (Signed)
Procedure Name: Intubation Date/Time: 11/20/2016 9:49 AM Performed by: Darlyne Russian Pre-anesthesia Checklist: Patient identified, Emergency Drugs available, Suction available, Patient being monitored and Timeout performed Patient Re-evaluated:Patient Re-evaluated prior to inductionOxygen Delivery Method: Circle system utilized Preoxygenation: Pre-oxygenation with 100% oxygen Intubation Type: IV induction Ventilation: Mask ventilation without difficulty Laryngoscope Size: Mac and 3 Grade View: Grade II Tube type: Oral Tube size: 7.0 mm Number of attempts: 1 Airway Equipment and Method: Stylet and Bite block Secured at: 21.5 cm Tube secured with: Tape Dental Injury: Teeth and Oropharynx as per pre-operative assessment

## 2016-11-20 NOTE — Anesthesia Post-op Follow-up Note (Cosign Needed)
Anesthesia QCDR form completed.        

## 2016-11-20 NOTE — Anesthesia Preprocedure Evaluation (Signed)
Anesthesia Evaluation  Patient identified by MRN, date of birth, ID band Patient awake    Reviewed: Allergy & Precautions, H&P , NPO status , Patient's Chart, lab work & pertinent test results  History of Anesthesia Complications Negative for: history of anesthetic complications  Airway Mallampati: III  TM Distance: <3 FB Neck ROM: limited    Dental  (+) Poor Dentition   Pulmonary neg pulmonary ROS, neg shortness of breath,    Pulmonary exam normal breath sounds clear to auscultation       Cardiovascular Exercise Tolerance: Good hypertension, (-) angina+ Peripheral Vascular Disease  (-) Past MI and (-) DOE Normal cardiovascular exam+ Valvular Problems/Murmurs  Rhythm:regular Rate:Normal     Neuro/Psych negative neurological ROS  negative psych ROS   GI/Hepatic Neg liver ROS, PUD,   Endo/Other  negative endocrine ROS  Renal/GU negative Renal ROS  negative genitourinary   Musculoskeletal  (+) Arthritis ,   Abdominal   Peds  Hematology negative hematology ROS (+) anemia ,   Anesthesia Other Findings Past Medical History:   Hypertension                                                 Anemia                                                       DDD (degenerative disc disease), cervical                    DDD (degenerative disc disease), lumbar                      Elevated lipids                                              Osteoporosis                                                 Glaucoma                                                     Multiple gastric ulcers                                      Foot pain                                                    Colon polyp  Chronic low back pain                                        Carotid arterial disease ([place])                  01/09/2014      Comment:Overview:  Minimal on screening    Cancer ([place])                                     2016           Comment:bladder tumor  Past Surgical History:   BACK SURGERY                                                    Comment:x 6   CARPAL TUNNEL RELEASE                           Right              WRIST FRACTURE SURGERY                          Left              KNEE ARTHROSCOPY                                Right                Comment:x2   MANDIBLE SURGERY                                Bilateral                Comment:x2   CATARACT EXTRACTION                             Bilateral              TRANSURETHRAL RESECTION OF BLADDER TUMOR        N/A 09/29/2014       Comment:Procedure: TRANSURETHRAL RESECTION OF BLADDER               TUMOR (TURBT);  Surgeon: Hollice Espy, MD;                Location: [facility] ORS;  Service: Urology;                Laterality: N/A;   CYSTOSCOPY W/ RETROGRADES                       Bilateral 09/29/2014       Comment:Procedure: CYSTOSCOPY WITH RETROGRADE               PYELOGRAM;  Surgeon: Hollice Espy, MD;                Location: [facility] ORS;  Service: Urology;  Laterality: Bilateral;   EYE SURGERY                                                   CYSTOSCOPY WITH BIOPSY                          N/A 11/02/2014       Comment:Procedure: CYSTOSCOPY WITH BIOPSY/WITH               MITOMYCIN;  Surgeon: Hollice Espy, MD;                Location: [facility] ORS;  Service: Urology;                Laterality: N/A;   FOOT SURGERY                                    Right              ESOPHAGOGASTRODUODENOSCOPY (EGD) WITH PROPOFOL  N/A 05/10/2015     Comment:Procedure: ESOPHAGOGASTRODUODENOSCOPY (EGD)               WITH PROPOFOL;  Surgeon: Lollie Sails, MD;              Location: [facility] ENDOSCOPY;  Service: Endoscopy;               Laterality: N/A;   SHOULDER ARTHROSCOPY WITH ROTATOR CUFF REPAIR * Right 05/25/2015     Comment:Procedure: SHOULDER ARTHROSCOPY WITH ROTATOR               CUFF REPAIR AND SUBACROMIAL DECOMPRESSION,                release long head biceps tendon;  Surgeon:               Leanor Kail, MD;  Location: [facility] ORS;                Service: Orthopedics;  Laterality: Right;   ABDOMINAL HYSTERECTOMY                           1973        BMI    Body Mass Index   27.03 kg/m 2      Reproductive/Obstetrics negative OB ROS                             Anesthesia Physical  Anesthesia Plan  ASA: III  Anesthesia Plan: General ETT and General   Post-op Pain Management:    Induction: Intravenous  PONV Risk Score and Plan: 4 or greater and Ondansetron, Dexamethasone, Propofol, Midazolam, Scopolamine patch - Pre-op and Treatment may vary due to age or medical condition  Airway Management Planned: Oral ETT  Additional Equipment:   Intra-op Plan:   Post-operative Plan: Extubation in OR  Informed Consent: I have reviewed the patients History and Physical, chart, labs and discussed the procedure including the risks, benefits and alternatives for the proposed anesthesia with the patient or authorized representative who has indicated his/her understanding and acceptance.   Dental Advisory Given  Plan Discussed with: Anesthesiologist, CRNA and Surgeon  Anesthesia Plan Comments:  Anesthesia Quick Evaluation  

## 2016-11-20 NOTE — Transfer of Care (Signed)
Immediate Anesthesia Transfer of Care Note  Patient: [name]  Procedure(s) Performed: Procedure(s): INCISION AND DRAINAGE ABSCESS GLUTEAL (Right)  Patient Location: PACU  Anesthesia Type:General  Level of Consciousness: drowsy and patient cooperative  Airway & Oxygen Therapy: Patient Spontanous Breathing and Patient connected to nasal cannula oxygen  Post-op Assessment: Report given to RN and Post -op Vital signs reviewed and stable  Post vital signs: Reviewed and stable  Last Vitals:  Vitals:   11/20/16 0813 11/20/16 1054  BP: 128/64 (!) 132/55  Pulse: 61 65  Resp: 16 14  Temp: 36.1 C 36.1 C    Last Pain:  Vitals:   11/20/16 1054  TempSrc:   PainSc: 0-No pain      Patients Stated Pain Goal: 0 (97/91/50 4136)  Complications: No apparent anesthesia complications

## 2016-11-20 NOTE — Interval H&P Note (Signed)
History and Physical Interval Note:  11/20/2016 9:19 AM  [name]  has presented today for surgery, with the diagnosis of GLUTEAL ABSCESS  The various methods of treatment have been discussed with the patient and family. After consideration of risks, benefits and other options for treatment, the patient has consented to  Procedure(s): INCISION AND DRAINAGE ABSCESS GLUTEAL (N/A) as a surgical intervention .  The patient's history has been reviewed, patient examined, no change in status, stable for surgery.  I have reviewed the patient's chart and labs.  Questions were answered to the patient's satisfaction.     SANKAR,SEEPLAPUTHUR G

## 2016-11-20 NOTE — OR Nursing (Signed)
Patient stated that her abscess spontaneously drained last week with large amounts of fluid, brownish in color.  Today, it was draining and she placed a bandaid over site. 1/4 inch opening draining yellow/green thick fluid. Area cleaned with chg wipe. Denies pain except for throbbing and tenderness when area fills up with fluids.

## 2016-11-20 NOTE — Op Note (Addendum)
Preop diagnosis: Recurring right gluteal abscess and seroma  Post op diagnosis: Same  Operation: Drainage and debridement right gluteal abscess area  Surgeon: Mckinley Jewel  Assistant:     Anesthesia: Gen.  Complications: None  EBL: Less than 5 mL  Drains: Blake drain  Description: Patient was put to sleep and then placed in the left lateral position held in place with a beanbag. The right gluteal region was prepped and draped as sterile field and timeout performed. There is a small skin opening located in the upper-outer quadrant of the gluteal area which is the side for a recurring abscess. The opening was probed and an incision made extending from this opening toward posterior aspect for a distance of about 3 cm. The pocket underlying was then opened and contained some amount of from liquid but also a fair amount of a gelatinous material. This was debrided completely and the cavity was broken down to ensure there were no loculations. The cavity was then scraped with the Bovie scratchpad to allow for subsequent closure of this cavity completely. No evidence of a tracking of this process over the iliac crest as was once noted. A Blake drain was then positioned in the pocket and brought out through a separate incision. Drain was fastened to the skin with a 2-0 nylon stitch. The opening at the cavity site was then closed with interrupted mattress stitches of 3-0 nylon. The drain was connected to drainage chamber and dressing applied with 4 x 4's and Tegaderm. Patient subsequently extubated and returned recovery room stable condition

## 2016-11-20 NOTE — Discharge Instructions (Signed)

## 2016-11-20 NOTE — Anesthesia Postprocedure Evaluation (Signed)
Anesthesia Post Note  Patient: [name]  Procedure(s) Performed: Procedure(s) (LRB): INCISION AND DRAINAGE ABSCESS GLUTEAL (Right)  Patient location during evaluation: PACU Anesthesia Type: General Level of consciousness: awake and alert and oriented Pain management: pain level controlled Vital Signs Assessment: post-procedure vital signs reviewed and stable Respiratory status: spontaneous breathing Cardiovascular status: blood pressure returned to baseline Anesthetic complications: no     Last Vitals:  Vitals:   11/20/16 1150 11/20/16 1222  BP: (!) 122/46 (!) 132/46  Pulse: (!) 53 (!) 53  Resp: 16 16  Temp: 36.5 C     Last Pain:  Vitals:   11/20/16 1222  TempSrc:   PainSc: 5                  Colbi Schiltz

## 2016-11-21 ENCOUNTER — Encounter: Payer: Self-pay | Admitting: General Surgery

## 2016-11-23 ENCOUNTER — Ambulatory Visit (INDEPENDENT_AMBULATORY_CARE_PROVIDER_SITE_OTHER): Payer: Medicare Other

## 2016-11-23 DIAGNOSIS — L0231 Cutaneous abscess of buttock: Secondary | ICD-10-CM

## 2016-11-23 NOTE — Progress Notes (Signed)
Patient came in today for a wound check.  The wound is clean, with no signs of infection noted. .Follow up as scheduled.  

## 2016-11-25 LAB — AEROBIC/ANAEROBIC CULTURE W GRAM STAIN (SURGICAL/DEEP WOUND): Culture: NO GROWTH

## 2016-11-25 LAB — AEROBIC/ANAEROBIC CULTURE (SURGICAL/DEEP WOUND)

## 2016-11-26 ENCOUNTER — Ambulatory Visit (INDEPENDENT_AMBULATORY_CARE_PROVIDER_SITE_OTHER): Payer: Medicare Other | Admitting: General Surgery

## 2016-11-26 ENCOUNTER — Encounter: Payer: Self-pay | Admitting: General Surgery

## 2016-11-26 VITALS — BP 138/74 | HR 70 | Resp 12 | Ht 61.0 in | Wt 150.0 lb

## 2016-11-26 DIAGNOSIS — L0231 Cutaneous abscess of buttock: Secondary | ICD-10-CM

## 2016-11-26 NOTE — Patient Instructions (Addendum)
Return on Friday for drain removal.

## 2016-11-26 NOTE — Progress Notes (Signed)
Patient ID: [name], female   DOB: 1933/02/11, 83 y.o.   MRN: [id number]  Chief Complaint  Patient presents with  . Follow-up    HPI [name] is a 83 y.o. female here today for her post op I&D recurrent right gluteal abscess done on 11/20/2016. Drain sheet present. Patient is still draining about 20 ml per day. HPI  Past Medical History:  Diagnosis Date  . Anemia   . Cancer [facility]) 2016   bladder tumor  . Carotid arterial disease ([place]) 01/09/2014   Overview:  Minimal on screening   . Chronic low back pain   . Colon polyp   . DDD (degenerative disc disease), cervical   . DDD (degenerative disc disease), lumbar   . Elevated lipids   . Foot pain   . Glaucoma   . H/O concussion 04/2012   Due to fall [facility]  . Heart murmur   . HOH (hard of hearing)    Bilateral  Hearing Aids  . Hypertension   . Multiple gastric ulcers   . Osteoporosis   . Thyroid disease     Past Surgical History:  Procedure Laterality Date  . ABDOMINAL HYSTERECTOMY  1973  . BACK SURGERY     03/1975-ruptured disk, 11/1983-ruptured disk, 11/1984-ruptured disk, 10/1996-ruptured disk, 08/1997-spinal fusion, 02/10/07-spinal fusion [facility]  . CARPAL TUNNEL RELEASE Right   . CATARACT EXTRACTION Bilateral   . CYSTOSCOPY W/ RETROGRADES Bilateral 09/29/2014   Procedure: CYSTOSCOPY WITH RETROGRADE PYELOGRAM;  Surgeon: Hollice Espy, MD;  Location: [facility] ORS;  Service: Urology;  Laterality: Bilateral;  . CYSTOSCOPY WITH BIOPSY N/A 11/02/2014   Procedure: CYSTOSCOPY WITH BIOPSY/WITH MITOMYCIN;  Surgeon: Hollice Espy, MD;  Location: [facility] ORS;  Service: Urology;  Laterality: N/A;  . ESOPHAGOGASTRODUODENOSCOPY (EGD) WITH PROPOFOL N/A 05/10/2015   Procedure: ESOPHAGOGASTRODUODENOSCOPY (EGD) WITH PROPOFOL;  Surgeon: Lollie Sails, MD;  [facility] END[facility]COPY;  Service: Endoscopy;  Laterality: N/A;  . EYE SURGERY Bilateral    Cataract Extraction with IOL  . FOOT SURGERY Right     . INCISION AND DRAINAGE ABSCESS Right 11/20/2016   Procedure: INCISION AND DRAINAGE ABSCESS GLUTEAL;  Surgeon: Christene Lye, MD;  Location: [facility] ORS;  Service: General;  Laterality: Right;  . IRRIGATION AND DEBRIDEMENT BUTTOCKS Right 10/21/2015   Procedure: DRAINAGE GLUTEAL ABSCESS;  Surgeon: Christene Lye, MD;  Location: [facility] ORS;  Service: General;  Laterality: Right;  . KNEE ARTHROSCOPY Right    x2  . MANDIBLE SURGERY Bilateral    x2  . SHOULDER ARTHROSCOPY WITH ROTATOR CUFF REPAIR AND SUBACROMIAL DECOMPRESSION Right 05/25/2015   Procedure: SHOULDER ARTHROSCOPY WITH ROTATOR CUFF REPAIR AND SUBACROMIAL DECOMPRESSION, release long head biceps tendon;  Surgeon: Leanor Kail, MD;  Location: [facility] ORS;  Service: Orthopedics;  Laterality: Right;  . TRANSURETHRAL RESECTION OF BLADDER TUMOR N/A 09/29/2014   Procedure: TRANSURETHRAL RESECTION OF BLADDER TUMOR (TURBT);  Surgeon: Hollice Espy, MD;  Location: [facility] ORS;  Service: Urology;  Laterality: N/A;  . WRIST FRACTURE SURGERY Left     Family History  Problem Relation Age of Onset  . Heart attack Father   . Emphysema Father   . Heart disease Mother   . Hypertension Mother   . Diabetes type II Sister   . Kidney disease Neg Hx   . Bladder Cancer Neg Hx   . Breast cancer Neg Hx     Social History Social History  Substance Use Topics  . Smoking status: Never Smoker  . Smokeless tobacco:  Never Used  . Alcohol use No    No Known Allergies  Current Outpatient Prescriptions  Medication Sig Dispense Refill  . calcium-vitamin D (OSCAL WITH D) 500-200 MG-UNIT tablet Take 1 tablet by mouth 2 (two) times daily.    . carvedilol (COREG) 25 MG tablet Take 25 mg by mouth 2 (two) times daily with a meal.    . diltiazem (CARDIZEM CD) 240 MG 24 hr capsule Take 240 mg by mouth daily.    [name] latanoprost (XALATAN) 0.005 % ophthalmic solution Place 1 drop into both eyes at bedtime.    . pravastatin (PRAVACHOL) 40 MG tablet Take 40 mg  by mouth daily at 6 PM.    . raloxifene (EVISTA) 60 MG tablet Take 60 mg by mouth daily.    [name] sulfamethoxazole-trimethoprim (BACTRIM DS,SEPTRA DS) 800-160 MG tablet Take 1 tablet by mouth 2 (two) times daily.    [name] torsemide (DEMADEX) 20 MG tablet Take 20 mg by mouth as needed.    . traMADol (ULTRAM) 50 MG tablet Take 1 tablet (50 mg total) by mouth every 6 (six) hours as needed. 20 tablet 0   Current Facility-Administered Medications  Medication Dose Route Frequency Provider Last Rate Last Dose  . BCG vaccine (THERACYS) injection 81 mg  81 mg Bladder Instillation Once Roda Shutters, FNP      . sodium chloride flush (NS) 0.9 % injection 50 mL  50 mL Intracatheter Once McGowan, Hunt Oris, PA-C        Review of Systems Review of Systems  Constitutional: Negative.   Respiratory: Negative.   Cardiovascular: Negative.     Blood pressure 138/74, pulse 70, resp. rate 12, height 5\' 1"  (1.549 m), weight 150 lb (68 kg).  Physical Exam Physical Exam  Constitutional: She is oriented to person, place, and time. She appears well-developed and well-nourished.  Pulmonary/Chest: Effort normal.  Neurological: She is alert and oriented to person, place, and time.  Skin: Skin is warm and dry.     Psychiatric: She has a normal mood and affect. Her behavior is normal.    Data Reviewed Prior notes and drain sheet reviewed. Pt draining approx 20 ml per day.  Assessment    Right gluteal abscess- s/p I&D with jackson-pratt drain draining fluid, sutures at site of incision was removed in office today, drain site still intact     Plan     Return on Friday for possible drain removal.   HPI, Physical Exam, Assessment and Plan have been scribed under the direction and in the presence of Mckinley Jewel, MD  Gaspar Cola, CMA     I have completed the exam and reviewed the above documentation for accuracy and completeness.  I agree with the above.  Haematologist has been used and any errors  in dictation or transcription are unintentional.  Seeplaputhur G. Jamal Collin, M.D., F.A.C.S.   Junie Panning G 11/26/2016, 10:52 AM

## 2016-11-30 ENCOUNTER — Ambulatory Visit (INDEPENDENT_AMBULATORY_CARE_PROVIDER_SITE_OTHER): Payer: Medicare Other

## 2016-11-30 DIAGNOSIS — L0231 Cutaneous abscess of buttock: Secondary | ICD-10-CM

## 2016-11-30 NOTE — Progress Notes (Signed)
Patient ID: [name], female   DOB: [birth year], 83 y.o.   MRN: [id number] Patient came in today for a wound check.  The wound is clean, with no signs of infection noted. Drain sheet present, drainage less than 10 cc daily for the past 2 days. Drain removed and dressing applied. Follow up as scheduled.

## 2016-12-03 ENCOUNTER — Encounter: Payer: Self-pay | Admitting: General Surgery

## 2016-12-27 ENCOUNTER — Other Ambulatory Visit: Payer: Self-pay | Admitting: *Deleted

## 2016-12-27 ENCOUNTER — Ambulatory Visit (INDEPENDENT_AMBULATORY_CARE_PROVIDER_SITE_OTHER): Payer: Medicare Other | Admitting: General Surgery

## 2016-12-27 ENCOUNTER — Encounter: Payer: Self-pay | Admitting: General Surgery

## 2016-12-27 VITALS — BP 134/72 | HR 68 | Resp 12 | Ht 61.0 in | Wt 148.0 lb

## 2016-12-27 DIAGNOSIS — L0231 Cutaneous abscess of buttock: Secondary | ICD-10-CM

## 2016-12-27 NOTE — Addendum Note (Signed)
Addended by: Dominga Ferry on: 12/27/2016 11:49 AM   Modules accepted: Level of Service

## 2016-12-27 NOTE — Progress Notes (Signed)
Patient ID: [name], female   DOB: 10-Mar-1933, 83 y.o.   MRN: [id number]  Chief Complaint  Patient presents with  . Follow-up    abscess right gluteal    HPI [name] is a 83 y.o. female is here today for a one month post op of a right gluteal abscess. Patient states she is doing well, she had a little swelling a couple of days ago and it started draining again. Denies fever.  HPI  Past Medical History:  Diagnosis Date  . Anemia   . Cancer [facility]) 2016   bladder tumor  . Carotid arterial disease ([place]) 01/09/2014   Overview:  Minimal on screening   . Chronic low back pain   . Colon polyp   . DDD (degenerative disc disease), cervical   . DDD (degenerative disc disease), lumbar   . Elevated lipids   . Foot pain   . Glaucoma   . H/O concussion 04/2012   Due to fall at [facility]  . Heart murmur   . HOH (hard of hearing)    Bilateral  Hearing Aids  . Hypertension   . Multiple gastric ulcers   . Osteoporosis   . Thyroid disease     Past Surgical History:  Procedure Laterality Date  . ABDOMINAL HYSTERECTOMY  1973  . BACK SURGERY     03/1975-ruptured disk, 11/1983-ruptured disk, 11/1984-ruptured disk, 10/1996-ruptured disk, 08/1997-spinal fusion, 02/10/07-spinal fusion [facility]  . CARPAL TUNNEL RELEASE Right   . CATARACT EXTRACTION Bilateral   . CYSTOSCOPY W/ RETROGRADES Bilateral 09/29/2014   Procedure: CYSTOSCOPY WITH RETROGRADE PYELOGRAM;  Surgeon: Hollice Espy, MD;  Location: [facility] ORS;  Service: Urology;  Laterality: Bilateral;  . CYSTOSCOPY WITH BIOPSY N/A 11/02/2014   Procedure: CYSTOSCOPY WITH BIOPSY/WITH MITOMYCIN;  Surgeon: Hollice Espy, MD;  Location: [facility] ORS;  Service: Urology;  Laterality: N/A;  . ESOPHAGOGASTRODUODENOSCOPY (EGD) WITH PROPOFOL N/A 05/10/2015   Procedure: ESOPHAGOGASTRODUODENOSCOPY (EGD) WITH PROPOFOL;  Surgeon: Lollie Sails, MD;  Location: [facility] ENDOSCOPY;  Service: Endoscopy;  Laterality: N/A;  . EYE SURGERY  Bilateral    Cataract Extraction with IOL  . FOOT SURGERY Right   . INCISION AND DRAINAGE ABSCESS Right 11/20/2016   Procedure: INCISION AND DRAINAGE ABSCESS GLUTEAL;  Surgeon: Christene Lye, MD;  Location: [facility] ORS;  Service: General;  Laterality: Right;  . IRRIGATION AND DEBRIDEMENT BUTTOCKS Right 10/21/2015   Procedure: DRAINAGE GLUTEAL ABSCESS;  Surgeon: Christene Lye, MD;  Location: [facility] ORS;  Service: General;  Laterality: Right;  . KNEE ARTHROSCOPY Right    x2  . MANDIBLE SURGERY Bilateral    x2  . SHOULDER ARTHROSCOPY WITH ROTATOR CUFF REPAIR AND SUBACROMIAL DECOMPRESSION Right 05/25/2015   Procedure: SHOULDER ARTHROSCOPY WITH ROTATOR CUFF REPAIR AND SUBACROMIAL DECOMPRESSION, release long head biceps tendon;  Surgeon: Leanor Kail, MD;  Location: [facility] ORS;  Service: Orthopedics;  Laterality: Right;  . TRANSURETHRAL RESECTION OF BLADDER TUMOR N/A 09/29/2014   Procedure: TRANSURETHRAL RESECTION OF BLADDER TUMOR (TURBT);  Surgeon: Hollice Espy, MD;  Location: [facility] ORS;  Service: Urology;  Laterality: N/A;  . WRIST FRACTURE SURGERY Left     Family History  Problem Relation Age of Onset  . Heart attack Father   . Emphysema Father   . Heart disease Mother   . Hypertension Mother   . Diabetes type II Sister   . Kidney disease Neg Hx   . Bladder Cancer Neg Hx   . Breast cancer Neg Hx     Social  History Social History  Substance Use Topics  . Smoking status: Never Smoker  . Smokeless tobacco: Never Used  . Alcohol use No    No Known Allergies  Current Outpatient Prescriptions  Medication Sig Dispense Refill  . calcium-vitamin D (OSCAL WITH D) 500-200 MG-UNIT tablet Take 1 tablet by mouth 2 (two) times daily.    . carvedilol (COREG) 25 MG tablet Take 25 mg by mouth 2 (two) times daily with a meal.    . diltiazem (CARDIZEM CD) 240 MG 24 hr capsule Take 240 mg by mouth daily.    [name] latanoprost (XALATAN) 0.005 % ophthalmic solution Place 1 drop into both eyes  at bedtime.    . pravastatin (PRAVACHOL) 40 MG tablet Take 40 mg by mouth daily at 6 PM.    . raloxifene (EVISTA) 60 MG tablet Take 60 mg by mouth daily.    [name] sulfamethoxazole-trimethoprim (BACTRIM DS,SEPTRA DS) 800-160 MG tablet Take by mouth.    . torsemide (DEMADEX) 20 MG tablet Take 20 mg by mouth as needed.     Current Facility-Administered Medications  Medication Dose Route Frequency Provider Last Rate Last Dose  . BCG vaccine (THERACYS) injection 81 mg  81 mg Bladder Instillation Once Roda Shutters, FNP      . sodium chloride flush (NS) 0.9 % injection 50 mL  50 mL Intracatheter Once McGowan, Hunt Oris, PA-C        Review of Systems Review of Systems  Constitutional: Negative.   Respiratory: Negative.   Cardiovascular: Negative.     Blood pressure 134/72, pulse 68, resp. rate 12, height 5\' 1"  (1.549 m), weight 148 lb (67.1 kg).  Physical Exam Physical Exam  Constitutional: She is oriented to person, place, and time. She appears well-developed and well-nourished.  Neck: Neck supple.  Neurological: She is alert and oriented to person, place, and time.  Skin: Skin is warm and dry.    Data Reviewed Prior notes  Assessment    Right gluteal abscess-recurring in spite of open drainage and prior catheter drainage Patient taking bactrim on long term basis      Plan     Reccommended option  of seeing an orthopedic doctor -Dr. Rudene Christians- to get area cleaned out and wound vac placed. She is agreeable.  Will arrange an appointment with Dr. Rudene Christians.      HPI, Physical Exam, Assessment and Plan have been scribed under the direction and in the presence of Mckinley Jewel, MD.  Verlene Mayer, CMA I have completed the exam and reviewed the above documentation for accuracy and completeness.  I agree with the above.  Haematologist has been used and any errors in dictation or transcription are unintentional.  Jeris Roser G. Jamal Collin, M.D., F.A.C.S.      Junie Panning  G 12/27/2016, 10:53 AM

## 2016-12-27 NOTE — Progress Notes (Signed)
error 

## 2016-12-27 NOTE — Patient Instructions (Signed)
Will arrange an appointment with Dr. Rudene Christians.

## 2017-01-01 ENCOUNTER — Telehealth: Payer: Self-pay | Admitting: *Deleted

## 2017-01-04 ENCOUNTER — Ambulatory Visit
Admission: RE | Admit: 2017-01-04 | Discharge: 2017-01-04 | Disposition: A | Discharge status: Home / Self Care | Payer: Medicare Other | Source: Ambulatory Visit | Attending: Infectious Diseases | Admitting: Infectious Diseases

## 2017-01-04 DIAGNOSIS — D62 Acute posthemorrhagic anemia: Secondary | ICD-10-CM | POA: Insufficient documentation

## 2017-01-04 DIAGNOSIS — M5030 Other cervical disc degeneration, unspecified cervical region: Secondary | ICD-10-CM | POA: Insufficient documentation

## 2017-01-04 DIAGNOSIS — R609 Edema, unspecified: Secondary | ICD-10-CM | POA: Diagnosis not present

## 2017-01-04 DIAGNOSIS — M461 Sacroiliitis, not elsewhere classified: Secondary | ICD-10-CM | POA: Diagnosis not present

## 2017-01-04 DIAGNOSIS — M810 Age-related osteoporosis without current pathological fracture: Secondary | ICD-10-CM | POA: Insufficient documentation

## 2017-01-04 DIAGNOSIS — K635 Polyp of colon: Secondary | ICD-10-CM | POA: Diagnosis not present

## 2017-01-04 DIAGNOSIS — Z01818 Encounter for other preprocedural examination: Secondary | ICD-10-CM | POA: Insufficient documentation

## 2017-01-04 DIAGNOSIS — D509 Iron deficiency anemia, unspecified: Secondary | ICD-10-CM | POA: Diagnosis not present

## 2017-01-04 DIAGNOSIS — E782 Mixed hyperlipidemia: Secondary | ICD-10-CM | POA: Diagnosis not present

## 2017-01-04 DIAGNOSIS — I779 Disorder of arteries and arterioles, unspecified: Secondary | ICD-10-CM | POA: Diagnosis not present

## 2017-01-04 DIAGNOSIS — C675 Malignant neoplasm of bladder neck: Secondary | ICD-10-CM | POA: Insufficient documentation

## 2017-01-04 DIAGNOSIS — I10 Essential (primary) hypertension: Secondary | ICD-10-CM | POA: Diagnosis not present

## 2017-01-04 MED ORDER — HEPARIN SOD (PORK) LOCK FLUSH 100 UNIT/ML IV SOLN
INTRAVENOUS | Status: AC
  Administered 2017-01-04: 500 [IU]
  Filled 2017-01-04: qty 5

## 2017-01-04 MED ORDER — VANCOMYCIN HCL IN DEXTROSE 1-5 GM/200ML-% IV SOLN
1000.0000 mg | Freq: Once | INTRAVENOUS | Status: AC
  Administered 2017-01-04: 1000 mg via INTRAVENOUS

## 2017-01-04 MED ORDER — PIPERACILLIN-TAZOBACTAM 3.375 G IVPB
INTRAVENOUS | Status: AC
Start: 2017-01-04 — End: 2017-01-04
  Administered 2017-01-04: 3.375 g via INTRAVENOUS
  Filled 2017-01-04: qty 50

## 2017-01-04 MED ORDER — VANCOMYCIN HCL IN DEXTROSE 1-5 GM/200ML-% IV SOLN
INTRAVENOUS | Status: AC
Start: 2017-01-04 — End: 2017-01-04
  Administered 2017-01-04: 1000 mg via INTRAVENOUS
  Filled 2017-01-04: qty 200

## 2017-01-04 MED ORDER — PIPERACILLIN-TAZOBACTAM 3.375 G IVPB 30 MIN
3.3750 g | Freq: Once | INTRAVENOUS | Status: AC
  Administered 2017-01-04: 3.375 g via INTRAVENOUS

## 2017-01-04 MED ORDER — SODIUM CHLORIDE 0.9 % IV SOLN
INTRAVENOUS | Status: DC
  Administered 2017-01-04: 15:00:00 via INTRAVENOUS

## 2017-01-04 NOTE — OR Nursing (Signed)
Patient tolerated insertion of PICC line and antibiotics without any complaints.  PICC line flushed and patient sent home via wheelchair to lobby. Son and daughter in law here to take patient home.

## 2017-01-07 ENCOUNTER — Emergency Department
Admission: EM | Admit: 2017-01-07 | Discharge: 2017-01-08 | Disposition: A | Discharge status: Home / Self Care | Payer: Medicare Other | Attending: Emergency Medicine | Admitting: Emergency Medicine

## 2017-01-07 ENCOUNTER — Emergency Department: Payer: Medicare Other

## 2017-01-07 ENCOUNTER — Encounter: Payer: Self-pay | Admitting: Emergency Medicine

## 2017-01-07 DIAGNOSIS — Y828 Other medical devices associated with adverse incidents: Secondary | ICD-10-CM | POA: Diagnosis not present

## 2017-01-07 DIAGNOSIS — Z79899 Other long term (current) drug therapy: Secondary | ICD-10-CM | POA: Diagnosis not present

## 2017-01-07 DIAGNOSIS — I10 Essential (primary) hypertension: Secondary | ICD-10-CM | POA: Diagnosis not present

## 2017-01-07 DIAGNOSIS — Z8551 Personal history of malignant neoplasm of bladder: Secondary | ICD-10-CM | POA: Insufficient documentation

## 2017-01-07 DIAGNOSIS — T82898A Other specified complication of vascular prosthetic devices, implants and grafts, initial encounter: Secondary | ICD-10-CM | POA: Diagnosis present

## 2017-01-07 MED ORDER — ALTEPLASE 100 MG IV SOLR
INTRAVENOUS | Status: AC
  Filled 2017-01-07: qty 100

## 2017-01-07 MED ORDER — ALTEPLASE 2 MG IJ SOLR
2.0000 mg | Freq: Once | INTRAMUSCULAR | Status: AC
  Administered 2017-01-07: 2 mg
  Filled 2017-01-07: qty 2

## 2017-01-07 NOTE — ED Notes (Addendum)
Picc team gave an estimated eta of 45 mins

## 2017-01-07 NOTE — ED Provider Notes (Signed)
[facility] Emergency Department Provider Note  Time seen: 7:04 PM  I have reviewed the triage vital signs and the nursing notes.   HISTORY  Chief Complaint PICC line clogged    HPI [name] is a 83 y.o. female with a past medical history of hypertension,history of gluteal abscess/infection currently on antibiotics via PICC line, who presents to the emergency department with a clotted PICC line. According to the patient the home health nurse came to the house today to infuse antibiotics. She was unable to pursue antibiotics and/or drawback. So she sent the patient to the emergency department for evaluation of the PICC line.patient has no other complaints today. Denies any fever. Denies any left arm pain. Negative review of systems.  Past Medical History:  Diagnosis Date  . Anemia   . Cancer [facility]) 2016   bladder tumor  . Carotid arterial disease ([place]) 01/09/2014   Overview:  Minimal on screening   . Chronic low back pain   . Colon polyp   . DDD (degenerative disc disease), cervical   . DDD (degenerative disc disease), lumbar   . Elevated lipids   . Foot pain   . Glaucoma   . H/O concussion 04/2012   Due to fall at [facility]  . Heart murmur   . HOH (hard of hearing)    Bilateral  Hearing Aids  . Hypertension   . Multiple gastric ulcers   . Osteoporosis   . Thyroid disease     Patient Active Problem List   Diagnosis Date Noted  . Monoclonal gammopathy of unknown significance (MGUS) 06/11/2016  . Abscess of gluteal region 02/21/2016  . Psoas abscess ([place]) 02/21/2016  . Malignant neoplasm of urinary bladder ([place]) 08/23/2015  . History of repair of rotator cuff 08/23/2015  . Encounter for general adult medical examination without abnormal findings 08/04/2015  . Impingement syndrome of shoulder 03/30/2015  . Impingement syndrome of right shoulder 03/30/2015  . Anemia, iron deficiency 02/02/2015  . IDA (iron deficiency  anemia) 01/26/2015  . Malignant neoplasm of bladder neck ([place]) 12/30/2014  . Colon polyp 12/17/2014  . Inflammation of sacroiliac joint ([place]) 09/22/2014  . Carotid arterial disease ([place]) 01/09/2014  . Combined fat and carbohydrate induced hyperlipemia 01/09/2014  . Accumulation of fluid in tissues 01/09/2014  . BP (high blood pressure) 01/09/2014  . Arthritis, degenerative 01/09/2014  . OP (osteoporosis) 01/09/2014  . Carotid artery disease ([place]) 01/09/2014  . Edema 01/09/2014  . Cervical osteoarthritis 10/28/2013  . DDD (degenerative disc disease), cervical 10/28/2013  . Acute blood loss anemia 05/16/2012  . Fracture of parietal bone ([place]) 05/16/2012  . Fall down stairs 05/16/2012  . Hemorrhage into subarachnoid space of neuraxis ([place]) 05/16/2012  . Subdural hematoma ([place]) 05/16/2012  . Fracture of temporal bone ([place]) 05/16/2012    Past Surgical History:  Procedure Laterality Date  . ABDOMINAL HYSTERECTOMY  1973  . BACK SURGERY     03/1975-ruptured disk, 11/1983-ruptured disk, 11/1984-ruptured disk, 10/1996-ruptured disk, 08/1997-spinal fusion, 02/10/07-spinal fusion [facility]  . CARPAL TUNNEL RELEASE Right   . CATARACT EXTRACTION Bilateral   . CYSTOSCOPY W/ RETROGRADES Bilateral 09/29/2014   Procedure: CYSTOSCOPY WITH RETROGRADE PYELOGRAM;  Surgeon: Hollice Espy, MD;  Location: [facility] ORS;  Service: Urology;  Laterality: Bilateral;  . CYSTOSCOPY WITH BIOPSY N/A 11/02/2014   Procedure: CYSTOSCOPY WITH BIOPSY/WITH MITOMYCIN;  Surgeon: Hollice Espy, MD;  Location: [facility] ORS;  Service: Urology;  Laterality: N/A;  . ESOPHAGOGASTRODUODENOSCOPY (EGD) WITH PROPOFOL N/A 05/10/2015  Procedure: ESOPHAGOGASTRODUODENOSCOPY (EGD) WITH PROPOFOL;  Surgeon: Lollie Sails, MD;  Location: [facility] ENDOSCOPY;  Service: Endoscopy;  Laterality: N/A;  . EYE SURGERY Bilateral    Cataract Extraction with IOL  . FOOT SURGERY Right   . INCISION AND DRAINAGE ABSCESS Right 11/20/2016   Procedure: INCISION AND  DRAINAGE ABSCESS GLUTEAL;  Surgeon: Christene Lye, MD;  Location: [facility] ORS;  Service: General;  Laterality: Right;  . IRRIGATION AND DEBRIDEMENT BUTTOCKS Right 10/21/2015   Procedure: DRAINAGE GLUTEAL ABSCESS;  Surgeon: Christene Lye, MD;  Location: [facility] ORS;  Service: General;  Laterality: Right;  . KNEE ARTHROSCOPY Right    x2  . MANDIBLE SURGERY Bilateral    x2  . SHOULDER ARTHROSCOPY WITH ROTATOR CUFF REPAIR AND SUBACROMIAL DECOMPRESSION Right 05/25/2015   Procedure: SHOULDER ARTHROSCOPY WITH ROTATOR CUFF REPAIR AND SUBACROMIAL DECOMPRESSION, release long head biceps tendon;  Surgeon: Leanor Kail, MD;  Location: [facility] ORS;  Service: Orthopedics;  Laterality: Right;  . TRANSURETHRAL RESECTION OF BLADDER TUMOR N/A 09/29/2014   Procedure: TRANSURETHRAL RESECTION OF BLADDER TUMOR (TURBT);  Surgeon: Hollice Espy, MD;  Location: [facility] ORS;  Service: Urology;  Laterality: N/A;  . WRIST FRACTURE SURGERY Left     Prior to Admission medications   Medication Sig Start Date End Date Taking? Authorizing Provider  calcium-vitamin D (OSCAL WITH D) 500-200 MG-UNIT tablet Take 1 tablet by mouth 2 (two) times daily.    [provider]  carvedilol (COREG) 12.5 MG tablet Take 12.5 mg by mouth 2 (two) times daily with a meal.    [provider]  diltiazem (CARDIZEM CD) 240 MG 24 hr capsule Take 240 mg by mouth daily.    [provider]  HEPARIN LOCK FLUSH IV Inject 1 Syringe into the vein 4 (four) times daily. Use after normal saline flush--after each antibiotic use    [provider]  latanoprost (XALATAN) 0.005 % ophthalmic solution Place 1 drop into both eyes at bedtime.    [provider]  piperacillin-tazobactam (ZOSYN) 3.375 (3-0.375) g injection Inject 3.375 g into the vein 3 (three) times daily.  01/04/17   [provider]  pravastatin (PRAVACHOL) 40 MG tablet Take 40 mg by mouth at bedtime.     [provider]  raloxifene  (EVISTA) 60 MG tablet Take 60 mg by mouth daily.    [provider]  sodium chloride 0.9 % infusion Inject 10 mLs into the vein 4 (four) times daily.  01/04/17   [provider]  sulfamethoxazole-trimethoprim (BACTRIM DS,SEPTRA DS) 800-160 MG tablet Take 1 tablet by mouth 2 (two) times daily with a meal.  12/13/16   [provider]  vancomycin (VANCOCIN) 10 G SOLR injection Inject 1,000 mg into the vein daily.  01/04/17   [provider]    No Known Allergies  Family History  Problem Relation Age of Onset  . Heart attack Father   . Emphysema Father   . Heart disease Mother   . Hypertension Mother   . Diabetes type II Sister   . Kidney disease Neg Hx   . Bladder Cancer Neg Hx   . Breast cancer Neg Hx     Social History Social History  Substance Use Topics  . Smoking status: Never Smoker  . Smokeless tobacco: Never Used  . Alcohol use No    Review of Systems Constitutional: Negative for fever. Cardiovascular: Negative for chest pain. Respiratory: Negative for shortness of breath. Gastrointestinal: Negative for abdominal pain Musculoskeletal: left upper extremity PICC  line Neurological: Negative for headache All other ROS negative  ____________________________________________   PHYSICAL EXAM:  VITAL SIGNS: ED Triage Vitals [01/07/17 1659]  Enc Vitals Group     BP (!) 133/115     Pulse Rate 84     Resp 18     Temp 99.1 F (37.3 C)     Temp Source Oral     SpO2 100 %     Weight 148 lb (67.1 kg)     Height 5\' 1"  (1.549 m)     Head Circumference      Peak Flow      Pain Score      Pain Loc      Pain Edu?      Excl. in [place]?     Constitutional: Alert and oriented. Well appearing and in no distress. Eyes: Normal exam ENT   Head: Normocephalic and atraumatic.   Mouth/Throat: Mucous membranes are moist. Cardiovascular: Normal rate, regular rhythm. Respiratory: Normal respiratory effort without tachypnea nor retractions.  Breath sounds are clear Gastrointestinal: Soft and nontender. No distention. Musculoskeletal: patient has a PICC line present to the left upper extremity. Neurologic:  Normal speech and language. No gross focal neurologic deficits  Skin:  Skin is warm, dry and intact.  Psychiatric: Mood and affect are normal.   ____________________________________________   INITIAL IMPRESSION / ASSESSMENT AND PLAN / ED COURSE  Pertinent labs & imaging results that were available during my care of the patient were reviewed by me and considered in my medical decision making (see chart for details).  patient presents to the emergency department with a malfunctioning PICC line. We will discuss with the IV team for further recommendations. Patient agreeable with plan.  patient care signed out to Dr. Joni Fears.  ____________________________________________   FINAL CLINICAL IMPRESSION(S) / ED DIAGNOSES  malfunctioning PICC line    Harvest Dark, MD 01/07/17 2022

## 2017-01-07 NOTE — ED Notes (Signed)
Upon assessment pt states her home health care nurse noticed her picc line was not functioning appropriately today. Currently line is clamped.

## 2017-01-07 NOTE — Progress Notes (Signed)
Patient presented to ER with clotted L SL PICC. Alteplase instilled(for 2 hours) in port per standing orders. Upon return to patient room 2 hours later, Alteplase not effective and still unable to have blood return from PICC.  RN Notified.

## 2017-01-07 NOTE — ED Triage Notes (Signed)
Pt reports PICC line placed Friday last week. Pt reports has had no problems with line until today. Pt reports receiving medication through line this morning but when home health nurse came PICC line did not return blood and would not flush. Denies pain. Pt reports is being treated for an infection in her back and is scheduled for surgery 01/17/17. Left arm PICC line did not flush or return blood in triage.

## 2017-01-07 NOTE — ED Notes (Signed)
PICC team contacted

## 2017-01-08 ENCOUNTER — Inpatient Hospital Stay: Admission: RE | Admit: 2017-01-08 | Payer: Medicare Other | Source: Ambulatory Visit

## 2017-01-08 ENCOUNTER — Ambulatory Visit
Admission: RE | Admit: 2017-01-08 | Discharge: 2017-01-08 | Disposition: A | Discharge status: Home / Self Care | Payer: Medicare Other | Source: Ambulatory Visit | Attending: Infectious Diseases | Admitting: Infectious Diseases

## 2017-01-08 DIAGNOSIS — T82594A Other mechanical complication of infusion catheter, initial encounter: Secondary | ICD-10-CM

## 2017-01-08 HISTORY — PX: PICC LINE INSERTION: CATH118290

## 2017-01-08 MED ORDER — PIPERACILLIN-TAZOBACTAM 3.375 G IVPB 30 MIN
INTRAVENOUS | Status: AC
  Administered 2017-01-08: 3.375 g via INTRAVENOUS
  Filled 2017-01-08: qty 50

## 2017-01-08 MED ORDER — PIPERACILLIN-TAZOBACTAM 3.375 G IVPB 30 MIN
3.3750 g | Freq: Once | INTRAVENOUS | Status: AC
  Administered 2017-01-08: 3.375 g via INTRAVENOUS

## 2017-01-08 MED ORDER — SODIUM CHLORIDE FLUSH 0.9 % IV SOLN
INTRAVENOUS | Status: AC
  Filled 2017-01-08: qty 10

## 2017-01-08 MED ORDER — VANCOMYCIN HCL IN DEXTROSE 1-5 GM/200ML-% IV SOLN
1000.0000 mg | Freq: Once | INTRAVENOUS | Status: AC
  Administered 2017-01-08: 1000 mg via INTRAVENOUS
  Filled 2017-01-08: qty 200

## 2017-01-08 NOTE — ED Notes (Signed)

## 2017-01-08 NOTE — ED Provider Notes (Signed)
-----------------------------------------   2:22 AM on 01/08/2017 -----------------------------------------   Blood pressure 138/78, pulse 69, temperature 99.1 F (37.3 C), temperature source Oral, resp. rate 18, height 5\' 1"  (1.549 m), weight 67.1 kg (148 lb), SpO2 95 %.  Assuming care from Dr. Joni Fears.  In short, [name] is a 83 y.o. female with a chief complaint of PICC line clogged .  Refer to the original H&P for additional details.  The current plan of care is to follow up the results of the chest x-ray.   Clinical Course as of Jan 08 222  Tue Jan 08, 2017  0222 1. Left PICC noted coiled and kinked overlying the left brachiocephalic vein. This should be replaced, as deemed clinically appropriate. 2. Borderline cardiomegaly. Lungs remain grossly clear.   DG Chest Portable 1 View [AW]    Clinical Course User Index [AW] Loney Hering, MD   I did receive a phone call from radiology saying that the PICC was coiled and kinked over the left brachiocephalic vein. At this time the IV team as well as the PICC team are unavailable. I did contact the vascular access service and they state that the patient could receive a replacement of her PICC line tomorrow. I did decide to give the patient a dose of her vancomycin as well as her Zosyn which is what she has missed for the evening. I will discharge the patient to home to have her follow-up with the PICC team and have it replaced tomorrow.   Loney Hering, MD 01/08/17 [phone]

## 2017-01-08 NOTE — Progress Notes (Addendum)
Pt left single lumen PICC showing it to kinked over brachiocephalic vein. Vascular wellness asked to assess line. Power flushed with 6 38ml saline flushes. Patency and blood return restored waiting on cxr results.  Xray showed line still coiled and in the left subclavian. New line placed on the right.

## 2017-01-08 NOTE — Discharge Instructions (Signed)
Please follow up with the PICC team tomorrow to have the line reassessed and removed as needed.

## 2017-01-08 NOTE — Progress Notes (Signed)
Vascular wellness in to assess PICC line.  Catheter discontinued and replaced with new PICC line. Tolerated procedure well.  Discharged to home with son.

## 2017-01-10 ENCOUNTER — Other Ambulatory Visit
Admission: RE | Admit: 2017-01-10 | Discharge: 2017-01-10 | Disposition: A | Discharge status: Home / Self Care | Payer: Medicare Other | Source: Ambulatory Visit | Attending: Infectious Diseases | Admitting: Infectious Diseases

## 2017-01-10 DIAGNOSIS — C679 Malignant neoplasm of bladder, unspecified: Secondary | ICD-10-CM | POA: Insufficient documentation

## 2017-01-10 DIAGNOSIS — L0231 Cutaneous abscess of buttock: Secondary | ICD-10-CM | POA: Diagnosis present

## 2017-01-10 DIAGNOSIS — K6812 Psoas muscle abscess: Secondary | ICD-10-CM | POA: Diagnosis present

## 2017-01-10 LAB — CBC WITH DIFFERENTIAL/PLATELET
BASOS ABS: 0 10*3/uL (ref 0–0.1)
Basophils Relative: 1 %
EOS ABS: 0.1 10*3/uL (ref 0–0.7)
EOS PCT: 2 %
HCT: 23.6 % — ABNORMAL LOW (ref 35.0–47.0)
Hemoglobin: 7.8 g/dL — ABNORMAL LOW (ref 12.0–16.0)
LYMPHS PCT: 27 %
Lymphs Abs: 1.3 10*3/uL (ref 1.0–3.6)
MCH: 29.9 pg (ref 26.0–34.0)
MCHC: 32.9 g/dL (ref 32.0–36.0)
MCV: 90.9 fL (ref 80.0–100.0)
MONO ABS: 0.4 10*3/uL (ref 0.2–0.9)
Monocytes Relative: 9 %
Neutro Abs: 2.9 10*3/uL (ref 1.4–6.5)
Neutrophils Relative %: 61 %
PLATELETS: 277 10*3/uL (ref 150–440)
RBC: 2.6 MIL/uL — ABNORMAL LOW (ref 3.80–5.20)
RDW: 16.7 % — AB (ref 11.5–14.5)
WBC: 4.7 10*3/uL (ref 3.6–11.0)

## 2017-01-10 LAB — COMPREHENSIVE METABOLIC PANEL
ALBUMIN: 2.8 g/dL — AB (ref 3.5–5.0)
ALK PHOS: 59 U/L (ref 38–126)
ALT: 11 U/L — AB (ref 14–54)
AST: 18 U/L (ref 15–41)
Anion gap: 5 (ref 5–15)
BUN: 11 mg/dL (ref 6–20)
CO2: 23 mmol/L (ref 22–32)
CREATININE: 0.84 mg/dL (ref 0.44–1.00)
Calcium: 8.2 mg/dL — ABNORMAL LOW (ref 8.9–10.3)
Chloride: 111 mmol/L (ref 101–111)
GFR calc Af Amer: >60 mL/min (ref >60)
GLUCOSE: 121 mg/dL — AB (ref 65–99)
Potassium: 3.5 mmol/L (ref 3.5–5.1)
Sodium: 139 mmol/L (ref 135–145)
Total Bilirubin: 0.3 mg/dL (ref 0.3–1.2)
Total Protein: 6.6 g/dL (ref 6.5–8.1)

## 2017-01-10 LAB — SEDIMENTATION RATE: Sed Rate: >140 mm/hr — ABNORMAL HIGH (ref 0–30)

## 2017-01-10 LAB — C-REACTIVE PROTEIN: CRP: 2.4 mg/dL — AB (ref <1.0)

## 2017-01-10 LAB — VANCOMYCIN, TROUGH: Vancomycin Tr: 8 ug/mL — ABNORMAL LOW (ref 15–20)

## 2017-01-11 ENCOUNTER — Encounter
Admission: RE | Admit: 2017-01-11 | Discharge: 2017-01-11 | Disposition: A | Discharge status: Home / Self Care | Payer: Medicare Other | Source: Ambulatory Visit | Attending: Orthopedic Surgery | Admitting: Orthopedic Surgery

## 2017-01-11 DIAGNOSIS — Z01812 Encounter for preprocedural laboratory examination: Secondary | ICD-10-CM | POA: Diagnosis not present

## 2017-01-11 HISTORY — DX: Gastro-esophageal reflux disease without esophagitis: K21.9

## 2017-01-11 LAB — DIFFERENTIAL
BASOS ABS: 0 10*3/uL (ref 0–0.1)
BASOS PCT: 1 %
EOS ABS: 0.1 10*3/uL (ref 0–0.7)
Eosinophils Relative: 2 %
Lymphocytes Relative: 26 %
Lymphs Abs: 1.4 10*3/uL (ref 1.0–3.6)
Monocytes Absolute: 0.4 10*3/uL (ref 0.2–0.9)
Monocytes Relative: 8 %
NEUTROS ABS: 3.3 10*3/uL (ref 1.4–6.5)
NEUTROS PCT: 63 %

## 2017-01-11 LAB — CBC
HEMATOCRIT: 26.7 % — AB (ref 35.0–47.0)
Hemoglobin: 8.7 g/dL — ABNORMAL LOW (ref 12.0–16.0)
MCH: 30 pg (ref 26.0–34.0)
MCHC: 32.7 g/dL (ref 32.0–36.0)
MCV: 91.7 fL (ref 80.0–100.0)
PLATELETS: 274 10*3/uL (ref 150–440)
RBC: 2.91 MIL/uL — ABNORMAL LOW (ref 3.80–5.20)
RDW: 16.9 % — AB (ref 11.5–14.5)
WBC: 5.2 10*3/uL (ref 3.6–11.0)

## 2017-01-11 NOTE — Patient Instructions (Signed)
  Your procedure is scheduled on: January 17, 2017  (THURSDAY) Report to Same Day Surgery 2nd floor medical mall ([facility]-take elevator on left to 2nd floor.  Check in with surgery information desk.) To find out your arrival time please call [phone] between 1PM - 3PM on January 16, 2017 [facility]) Remember: Instructions that are not followed completely may result in serious medical risk, up to and including death, or upon the discretion of your surgeon and anesthesiologist your surgery may need to be rescheduled.    _x___ 1. Do not eat food or drink liquids after midnight. No gum chewing or  hard candies                           __x__ 2. No Alcohol for 24 hours before or after surgery.   __x__3. No Smoking for 24 prior to surgery.   ____  4. Bring all medications with you on the day of surgery if instructed.    __x__ 5. Notify your doctor if there is any change in your medical condition     (cold, fever, infections).     Do not wear jewelry, make-up, hairpins, clips or nail polish.  Do not wear lotions, powders, or perfumes. You may wear deodorant.  Do not shave 48 hours prior to surgery. Men may shave face and neck.  Do not bring valuables to the hospital.    [facility] is not responsible for any belongings or valuables.               Contacts, dentures or bridgework may not be worn into surgery.  Leave your suitcase in the car. After surgery it may be brought to your room.  For patients admitted to the hospital, discharge time is determined by your treatment team                      Patients discharged the day of surgery will not be allowed to drive home.  You will need someone to drive you home and stay with you the night of your procedure.    Please read over the following fact sheets that you were given:   [facility] Preparing for Surgery and or MRSA Information   TAKE THE FOLLOWING MEDICATIONS WITH A SIP OF WATER THE MORNING OF SURGERY   1.  CARVEDILOL  2. DILTIAZEM  3.  4.  5.  6.  ____Fleets enema or Magnesium Citrate as directed.   _x___ Use CHG Soap or sage wipes as directed on instruction sheet   ____ Use inhalers on the day of surgery and bring to hospital day of surgery  ____ Stop Metformin and Janumet 2 days prior to surgery.    ____ Take 1/2 of usual insulin dose the night before surgery and none on the morning surgery     _x___ Follow recommendations from Cardiologist, Pulmonologist or PCP regarding          stopping Aspirin, Coumadin, Plavix ,Eliquis, Effient, or Pradaxa, and Pletal.  X____Stop Anti-inflammatories such as Advil, Aleve, Ibuprofen, Motrin, Naproxen, Naprosyn, Goodies powders or aspirin products. OK to take Tylenol    _x___ Stop supplements until after surgery.  But may continue Vitamin D, Vitamin B, and multivitamin        ____ Bring C-Pap to the hospital.

## 2017-01-14 ENCOUNTER — Other Ambulatory Visit
Admission: RE | Admit: 2017-01-14 | Discharge: 2017-01-14 | Disposition: A | Discharge status: Home / Self Care | Payer: Medicare Other | Source: Ambulatory Visit | Attending: Infectious Diseases | Admitting: Infectious Diseases

## 2017-01-14 DIAGNOSIS — L0231 Cutaneous abscess of buttock: Secondary | ICD-10-CM | POA: Diagnosis present

## 2017-01-14 DIAGNOSIS — K6812 Psoas muscle abscess: Secondary | ICD-10-CM | POA: Diagnosis present

## 2017-01-14 DIAGNOSIS — C679 Malignant neoplasm of bladder, unspecified: Secondary | ICD-10-CM | POA: Insufficient documentation

## 2017-01-14 LAB — COMPREHENSIVE METABOLIC PANEL
ALBUMIN: 2.9 g/dL — AB (ref 3.5–5.0)
ALK PHOS: 53 U/L (ref 38–126)
ALT: 11 U/L — AB (ref 14–54)
AST: 18 U/L (ref 15–41)
Anion gap: 8 (ref 5–15)
BUN: 10 mg/dL (ref 6–20)
CALCIUM: 8.7 mg/dL — AB (ref 8.9–10.3)
CHLORIDE: 106 mmol/L (ref 101–111)
CO2: 24 mmol/L (ref 22–32)
CREATININE: 0.82 mg/dL (ref 0.44–1.00)
GFR calc Af Amer: >60 mL/min (ref >60)
GFR calc non Af Amer: >60 mL/min (ref >60)
GLUCOSE: 85 mg/dL (ref 65–99)
Potassium: 4 mmol/L (ref 3.5–5.1)
SODIUM: 138 mmol/L (ref 135–145)
Total Bilirubin: 0.4 mg/dL (ref 0.3–1.2)
Total Protein: 6.8 g/dL (ref 6.5–8.1)

## 2017-01-14 LAB — CBC WITH DIFFERENTIAL/PLATELET
BASOS PCT: 1 %
Basophils Absolute: 0 10*3/uL (ref 0–0.1)
EOS ABS: 0.1 10*3/uL (ref 0–0.7)
Eosinophils Relative: 2 %
HEMATOCRIT: 25.4 % — AB (ref 35.0–47.0)
HEMOGLOBIN: 8.5 g/dL — AB (ref 12.0–16.0)
LYMPHS ABS: 1.3 10*3/uL (ref 1.0–3.6)
Lymphocytes Relative: 21 %
MCH: 30.9 pg (ref 26.0–34.0)
MCHC: 33.3 g/dL (ref 32.0–36.0)
MCV: 92.9 fL (ref 80.0–100.0)
MONO ABS: 0.6 10*3/uL (ref 0.2–0.9)
MONOS PCT: 10 %
Neutro Abs: 4.3 10*3/uL (ref 1.4–6.5)
Neutrophils Relative %: 66 %
Platelets: 311 10*3/uL (ref 150–440)
RBC: 2.74 MIL/uL — ABNORMAL LOW (ref 3.80–5.20)
RDW: 17.1 % — AB (ref 11.5–14.5)
WBC: 6.4 10*3/uL (ref 3.6–11.0)

## 2017-01-14 LAB — VANCOMYCIN, TROUGH: Vancomycin Tr: 7 ug/mL — ABNORMAL LOW (ref 15–20)

## 2017-01-14 LAB — SEDIMENTATION RATE: SED RATE: 83 mm/h — AB (ref 0–30)

## 2017-01-14 LAB — C-REACTIVE PROTEIN: CRP: 0.9 mg/dL (ref <1.0)

## 2017-01-14 NOTE — Progress Notes (Signed)
[place]  [phone]) [phone]  Fax:(336) [phone]     ALYZZA ANDRINGA DOB: 1932/06/28  MR#: [id number]  IRJ#:[id number]  Patient Care Team: Kirk Ruths, MD as PCP - General (Internal Medicine) Hollice Espy, MD as Consulting Physician (Urology) Kirk Ruths, MD (Internal Medicine) Christene Lye, MD (General Surgery)  CHIEF COMPLAINT:  Iron deficiency anemia, unspecified. MGUS.  INTERVAL HISTORY: Patient returns to clinic today for repeat laboratory work and routine evaluation. She currently feels well and is asymptomatic. She has a PICC line in place for daily IV antibiotics for an infection in her back. She reports she is having surgery later this week. She has no neurologic complaints. She denies any recent fevers or illnesses. She has no chest pain or shortness of breath.  She denies any nausea, vomiting, constipation, or diarrhea. She has noted no melena or hematochezia. She has no urinary complaints.  Patient offers no further specific complaints today.   REVIEW OF SYSTEMS:   Review of Systems  Constitutional: Negative.  Negative for fever, malaise/fatigue and weight loss.  HENT: Negative for congestion and sinus pain.   Respiratory: Negative.  Negative for cough and shortness of breath.   Cardiovascular: Negative.  Negative for chest pain and leg swelling.  Gastrointestinal: Negative.  Negative for abdominal pain, blood in stool and melena.  Genitourinary: Negative.   Musculoskeletal: Positive for back pain.  Neurological: Negative.  Negative for dizziness, tingling, weakness and headaches.  Psychiatric/Behavioral: Negative.  The patient is not nervous/anxious and does not have insomnia.     As per HPI. Otherwise, a complete review of systems is negative.   PAST MEDICAL HISTORY: Past Medical History:  Diagnosis Date  . Anemia   . Cancer [facility]) 2016   bladder tumor  . Carotid arterial disease ([place]) 01/09/2014   Overview:   Minimal on screening   . Chronic low back pain   . Colon polyp   . DDD (degenerative disc disease), cervical   . DDD (degenerative disc disease), lumbar   . Elevated lipids   . Foot pain   . GERD (gastroesophageal reflux disease)   . Glaucoma   . H/O concussion 04/2012   Due to fall at [facility]  . Heart murmur   . HOH (hard of hearing)    Bilateral  Hearing Aids  . Hypertension   . Multiple gastric ulcers   . Osteoporosis   . Thyroid disease     PAST SURGICAL HISTORY: Past Surgical History:  Procedure Laterality Date  . ABDOMINAL HYSTERECTOMY  1973  . BACK SURGERY     03/1975-ruptured disk, 11/1983-ruptured disk, 11/1984-ruptured disk, 10/1996-ruptured disk, 08/1997-spinal fusion, 02/10/07-spinal fusion [facility]  . BACK SURGERY     Spinal Fusion X 2  . CARPAL TUNNEL RELEASE Right   . CATARACT EXTRACTION Bilateral   . CYSTOSCOPY W/ RETROGRADES Bilateral 09/29/2014   Procedure: CYSTOSCOPY WITH RETROGRADE PYELOGRAM;  Surgeon: Hollice Espy, MD;  Location: [facility] ORS;  Service: Urology;  Laterality: Bilateral;  . CYSTOSCOPY WITH BIOPSY N/A 11/02/2014   Procedure: CYSTOSCOPY WITH BIOPSY/WITH MITOMYCIN;  Surgeon: Hollice Espy, MD;  Location: [facility] ORS;  Service: Urology;  Laterality: N/A;  . ESOPHAGOGASTRODUODENOSCOPY (EGD) WITH PROPOFOL N/A 05/10/2015   Procedure: ESOPHAGOGASTRODUODENOSCOPY (EGD) WITH PROPOFOL;  Surgeon: Lollie Sails, MD;  Location: [facility] ENDOSCOPY;  Service: Endoscopy;  Laterality: N/A;  . EYE SURGERY Bilateral    Cataract Extraction with IOL  . FOOT SURGERY Right   . INCISION AND DRAINAGE ABSCESS Right  11/20/2016   Procedure: INCISION AND DRAINAGE ABSCESS GLUTEAL;  Surgeon: Christene Lye, MD;  Location: [facility] ORS;  Service: General;  Laterality: Right;  . IRRIGATION AND DEBRIDEMENT BUTTOCKS Right 10/21/2015   Procedure: DRAINAGE GLUTEAL ABSCESS;  Surgeon: Christene Lye, MD;  Location: [facility] ORS;  Service: General;  Laterality: Right;   . KNEE ARTHROSCOPY Right    x2  . MANDIBLE SURGERY Bilateral    x2  . PICC LINE INSERTION Right 01/08/2017  . SHOULDER ARTHROSCOPY WITH ROTATOR CUFF REPAIR AND SUBACROMIAL DECOMPRESSION Right 05/25/2015   Procedure: SHOULDER ARTHROSCOPY WITH ROTATOR CUFF REPAIR AND SUBACROMIAL DECOMPRESSION, release long head biceps tendon;  Surgeon: Leanor Kail, MD;  Location: [facility] ORS;  Service: Orthopedics;  Laterality: Right;  . TRANSURETHRAL RESECTION OF BLADDER TUMOR N/A 09/29/2014   Procedure: TRANSURETHRAL RESECTION OF BLADDER TUMOR (TURBT);  Surgeon: Hollice Espy, MD;  Location: [facility] ORS;  Service: Urology;  Laterality: N/A;  . WRIST FRACTURE SURGERY Left     FAMILY HISTORY Family History  Problem Relation Age of Onset  . Heart attack Father   . Emphysema Father   . Heart disease Mother   . Hypertension Mother   . Diabetes type II Sister   . Kidney disease Neg Hx   . Bladder Cancer Neg Hx   . Breast cancer Neg Hx     GYNECOLOGIC HISTORY:  No LMP recorded. Patient has had a hysterectomy.     ADVANCED DIRECTIVES:    HEALTH MAINTENANCE: Social History  Substance Use Topics  . Smoking status: Never Smoker  . Smokeless tobacco: Never Used  . Alcohol use No   No Known Allergies  Current Outpatient Prescriptions  Medication Sig Dispense Refill  . carvedilol (COREG) 12.5 MG tablet Take 12.5 mg by mouth 2 (two) times daily with a meal.    . diltiazem (CARDIZEM CD) 240 MG 24 hr capsule Take 240 mg by mouth daily.    [name] HEPARIN LOCK FLUSH IV Inject 1 Syringe into the vein 4 (four) times daily. Use after normal saline flush--after each antibiotic use    . latanoprost (XALATAN) 0.005 % ophthalmic solution Place 1 drop into both eyes at bedtime.    . piperacillin-tazobactam (ZOSYN) 3.375 (3-0.375) g injection Inject 3.375 g into the vein 3 (three) times daily.   5  . pravastatin (PRAVACHOL) 40 MG tablet Take 40 mg by mouth at bedtime.     . raloxifene (EVISTA) 60 MG tablet Take 60 mg  by mouth daily.    . sodium chloride 0.9 % infusion Inject 10 mLs into the vein 4 (four) times daily.   5  . sulfamethoxazole-trimethoprim (BACTRIM DS,SEPTRA DS) 800-160 MG tablet Take 1 tablet by mouth 2 (two) times daily with a meal.     . vancomycin (VANCOCIN) 10 G SOLR injection Inject 1,000 mg into the vein daily.   5  . calcium-vitamin D (OSCAL WITH D) 500-200 MG-UNIT tablet Take 1 tablet by mouth 2 (two) times daily.     Current Facility-Administered Medications  Medication Dose Route Frequency Provider Last Rate Last Dose  . BCG vaccine (THERACYS) injection 81 mg  81 mg Bladder Instillation Once Roda Shutters, FNP      . sodium chloride flush (NS) 0.9 % injection 50 mL  50 mL Intracatheter Once McGowan, Shannon A, PA-C        OBJECTIVE: BP (!) 145/66 (BP Location: Left Arm, Patient Position: Sitting)   Pulse 67   Temp 98.5 F (36.9 C) (Tympanic)  Resp 18   Wt 151 lb 6.4 oz (68.7 kg)   BMI 28.61 kg/m    Body mass index is 28.61 kg/m.    ECOG FS:1 - Symptomatic but completely ambulatory  General: Well-developed, well-nourished, no acute distress. Eyes: Pink conjunctiva, anicteric sclera. Lungs: Clear to auscultation bilaterally. Heart: Regular rate and rhythm. No rubs, murmurs, or gallops. Abdomen: Soft, nontender, nondistended. No organomegaly noted, normoactive bowel sounds. Musculoskeletal: No edema, cyanosis, or clubbing.   Neuro: Alert, answering all questions appropriately. Cranial nerves grossly intact. Skin: No rashes or petechiae noted. Psych: Normal affect.   LAB RESULTS:  CBC    Component Value Date/Time   WBC 7.7 01/15/2017 1325   RBC 2.83 (L) 01/15/2017 1325   HGB 8.6 (L) 01/15/2017 1325   HGB 8.2 (L) 09/05/2015 0900   HCT 25.7 (L) 01/15/2017 1325   HCT 27.6 (L) 09/05/2015 0900   PLT 334 01/15/2017 1325   PLT 389 (H) 09/05/2015 0900   MCV 90.9 01/15/2017 1325   MCV 83 09/05/2015 0900   MCH 30.4 01/15/2017 1325   MCHC 33.4 01/15/2017 1325    RDW 17.2 (H) 01/15/2017 1325   RDW 17.6 (H) 09/05/2015 0900   LYMPHSABS 1.6 01/15/2017 1325   LYMPHSABS 1.8 09/05/2015 0900   MONOABS 0.9 01/15/2017 1325   EOSABS 0.1 01/15/2017 1325   EOSABS 0.1 09/05/2015 0900   BASOSABS 0.0 01/15/2017 1325   BASOSABS 0.0 09/05/2015 0900   Lab Results  Component Value Date   IRON 29 01/15/2017   TIBC 212 (L) 01/15/2017   IRONPCTSAT 14 01/15/2017   Lab Results  Component Value Date   FERRITIN 499 (H) 01/15/2017   Lab Results  Component Value Date   TOTALPROTELP 6.5 01/15/2017   ALBUMINELP 3.1 01/15/2017   A1GS 0.4 01/15/2017   A2GS 1.0 01/15/2017   BETS 0.9 01/15/2017   GAMS 1.2 01/15/2017   MSPIKE 0.9 (H) 01/15/2017   SPEI Comment 01/15/2017     STUDIES: No results found.  ASSESSMENT: Iron deficiency anemia, MGUS.  PLAN:   1. Iron deficiency anemia: Possibly secondary to blood loss from gastritis. Patient's hemoglobin Remains decreased, but stable. Her iron stores are now within normal limits. She does not require additional IV Feraheme today. Patient has been instructed to continue her oral iron supplementation. It is also possible patient may have a component of MDS. Previously a bone marrow biopsy was discussed by another provider, but patient declined. She also may have a component of persistent bone marrow suppression given the chronic inflammation secondary to the infection in her back. Return to clinic in 4 months with repeat laboratory work and further evaluation. 2. MGUS: Patient is most recent M spike on was reported 0.9. This appears to be approximately her baseline ranging from 0.7-1.1  Other than her anemia she has no evidence of endorgan disease. Continue to monitor every 6 months. 3. Superficial bladder cancer: Continue follow-up and treatment per urology. 4. Elevated ferritin: Likely secondary to acute phase reaction given the infection in her back.  Patient expressed understanding and was in agreement with this plan.  She also understands that She can call clinic at any time with any questions, concerns, or complaints.    Lloyd Huger, MD 01/17/17 10:51 AM

## 2017-01-14 NOTE — Pre-Procedure Instructions (Signed)
Hgb and Hct results called and faxed to Dr. Rudene Christians office (spoke to [place] ).

## 2017-01-15 ENCOUNTER — Inpatient Hospital Stay: Payer: Medicare Other | Attending: Oncology

## 2017-01-15 ENCOUNTER — Inpatient Hospital Stay (HOSPITAL_BASED_OUTPATIENT_CLINIC_OR_DEPARTMENT_OTHER): Payer: Medicare Other | Admitting: Oncology

## 2017-01-15 ENCOUNTER — Inpatient Hospital Stay: Payer: Medicare Other

## 2017-01-15 VITALS — BP 145/66 | HR 67 | Temp 98.5°F | Resp 18 | Wt 151.4 lb

## 2017-01-15 DIAGNOSIS — C679 Malignant neoplasm of bladder, unspecified: Secondary | ICD-10-CM | POA: Diagnosis not present

## 2017-01-15 DIAGNOSIS — M810 Age-related osteoporosis without current pathological fracture: Secondary | ICD-10-CM | POA: Diagnosis not present

## 2017-01-15 DIAGNOSIS — I10 Essential (primary) hypertension: Secondary | ICD-10-CM

## 2017-01-15 DIAGNOSIS — M5030 Other cervical disc degeneration, unspecified cervical region: Secondary | ICD-10-CM

## 2017-01-15 DIAGNOSIS — E079 Disorder of thyroid, unspecified: Secondary | ICD-10-CM | POA: Diagnosis not present

## 2017-01-15 DIAGNOSIS — D472 Monoclonal gammopathy: Secondary | ICD-10-CM

## 2017-01-15 DIAGNOSIS — G8929 Other chronic pain: Secondary | ICD-10-CM

## 2017-01-15 DIAGNOSIS — M545 Low back pain: Secondary | ICD-10-CM | POA: Insufficient documentation

## 2017-01-15 DIAGNOSIS — Z79899 Other long term (current) drug therapy: Secondary | ICD-10-CM | POA: Insufficient documentation

## 2017-01-15 DIAGNOSIS — K219 Gastro-esophageal reflux disease without esophagitis: Secondary | ICD-10-CM | POA: Insufficient documentation

## 2017-01-15 DIAGNOSIS — Z8711 Personal history of peptic ulcer disease: Secondary | ICD-10-CM

## 2017-01-15 DIAGNOSIS — M5136 Other intervertebral disc degeneration, lumbar region: Secondary | ICD-10-CM | POA: Insufficient documentation

## 2017-01-15 DIAGNOSIS — D509 Iron deficiency anemia, unspecified: Secondary | ICD-10-CM | POA: Insufficient documentation

## 2017-01-15 LAB — CBC WITH DIFFERENTIAL/PLATELET
BASOS ABS: 0 10*3/uL (ref 0–0.1)
Basophils Relative: 1 %
EOS PCT: 1 %
Eosinophils Absolute: 0.1 10*3/uL (ref 0–0.7)
HCT: 25.7 % — ABNORMAL LOW (ref 35.0–47.0)
Hemoglobin: 8.6 g/dL — ABNORMAL LOW (ref 12.0–16.0)
LYMPHS ABS: 1.6 10*3/uL (ref 1.0–3.6)
LYMPHS PCT: 21 %
MCH: 30.4 pg (ref 26.0–34.0)
MCHC: 33.4 g/dL (ref 32.0–36.0)
MCV: 90.9 fL (ref 80.0–100.0)
MONO ABS: 0.9 10*3/uL (ref 0.2–0.9)
MONOS PCT: 12 %
Neutro Abs: 5.1 10*3/uL (ref 1.4–6.5)
Neutrophils Relative %: 65 %
Platelets: 334 10*3/uL (ref 150–440)
RBC: 2.83 MIL/uL — ABNORMAL LOW (ref 3.80–5.20)
RDW: 17.2 % — AB (ref 11.5–14.5)
WBC: 7.7 10*3/uL (ref 3.6–11.0)

## 2017-01-15 LAB — IRON AND TIBC
Iron: 29 ug/dL (ref 28–170)
Saturation Ratios: 14 % (ref 10.4–31.8)
TIBC: 212 ug/dL — ABNORMAL LOW (ref 250–450)
UIBC: 183 ug/dL

## 2017-01-15 LAB — FERRITIN: Ferritin: 499 ng/mL — ABNORMAL HIGH (ref 11–307)

## 2017-01-15 NOTE — Progress Notes (Signed)
Patient is here for follow up, she is doing well, she does have a pic line in and will be having surgery this week.

## 2017-01-16 LAB — HIGH SENSITIVITY CRP: CRP, High Sensitivity: 10.28 mg/L — ABNORMAL HIGH (ref 0.00–3.00)

## 2017-01-16 LAB — PROTEIN ELECTROPHORESIS, SERUM
A/G Ratio: 0.9 (ref 0.7–1.7)
ALPHA-2-GLOBULIN: 1 g/dL (ref 0.4–1.0)
Albumin ELP: 3.1 g/dL (ref 2.9–4.4)
Alpha-1-Globulin: 0.4 g/dL (ref 0.0–0.4)
Beta Globulin: 0.9 g/dL (ref 0.7–1.3)
GLOBULIN, TOTAL: 3.4 g/dL (ref 2.2–3.9)
Gamma Globulin: 1.2 g/dL (ref 0.4–1.8)
M-SPIKE, %: 0.9 g/dL — AB
TOTAL PROTEIN ELP: 6.5 g/dL (ref 6.0–8.5)

## 2017-01-16 LAB — KAPPA/LAMBDA LIGHT CHAINS
KAPPA FREE LGHT CHN: 97.3 mg/L — AB (ref 3.3–19.4)
Kappa, lambda light chain ratio: 13.33 — ABNORMAL HIGH (ref 0.26–1.65)
Lambda free light chains: 7.3 mg/L (ref 5.7–26.3)

## 2017-01-16 LAB — IGG, IGA, IGM
IGA: 33 mg/dL — AB (ref 64–422)
IGG (IMMUNOGLOBIN G), SERUM: 1460 mg/dL (ref 700–1600)
IGM (IMMUNOGLOBULIN M), SRM: 12 mg/dL — AB (ref 26–217)

## 2017-01-17 ENCOUNTER — Encounter
Admission: RE | Disposition: A | Discharge status: Home / Self Care | Payer: Self-pay | Source: Ambulatory Visit | Attending: Orthopedic Surgery

## 2017-01-17 ENCOUNTER — Ambulatory Visit
Admission: RE | Admit: 2017-01-17 | Discharge: 2017-01-17 | Disposition: A | Discharge status: Home / Self Care | Payer: Medicare Other | Source: Ambulatory Visit | Attending: Orthopedic Surgery | Admitting: Orthopedic Surgery

## 2017-01-17 ENCOUNTER — Encounter: Payer: Self-pay | Admitting: *Deleted

## 2017-01-17 ENCOUNTER — Ambulatory Visit: Payer: Medicare Other | Admitting: Anesthesiology

## 2017-01-17 DIAGNOSIS — M810 Age-related osteoporosis without current pathological fracture: Secondary | ICD-10-CM | POA: Diagnosis not present

## 2017-01-17 DIAGNOSIS — L0231 Cutaneous abscess of buttock: Secondary | ICD-10-CM | POA: Insufficient documentation

## 2017-01-17 DIAGNOSIS — Z79899 Other long term (current) drug therapy: Secondary | ICD-10-CM | POA: Diagnosis not present

## 2017-01-17 DIAGNOSIS — I10 Essential (primary) hypertension: Secondary | ICD-10-CM | POA: Diagnosis not present

## 2017-01-17 DIAGNOSIS — H409 Unspecified glaucoma: Secondary | ICD-10-CM | POA: Insufficient documentation

## 2017-01-17 HISTORY — PX: APPLICATION OF WOUND VAC: SHX5189

## 2017-01-17 HISTORY — PX: INCISION AND DRAINAGE ABSCESS: SHX5864

## 2017-01-17 SURGERY — INCISION AND DRAINAGE, ABSCESS
Anesthesia: General | Site: Buttocks | Laterality: Right | Wound class: Dirty or Infected

## 2017-01-17 MED ORDER — SUCCINYLCHOLINE CHLORIDE 20 MG/ML IJ SOLN
INTRAMUSCULAR | Status: DC | PRN
  Administered 2017-01-17: 80 mg via INTRAVENOUS

## 2017-01-17 MED ORDER — FENTANYL CITRATE (PF) 100 MCG/2ML IJ SOLN
INTRAMUSCULAR | Status: DC | PRN
  Administered 2017-01-17 (×2): 25 ug via INTRAVENOUS

## 2017-01-17 MED ORDER — SODIUM CHLORIDE 0.9 % IV SOLN: INTRAVENOUS | Status: DC

## 2017-01-17 MED ORDER — FENTANYL CITRATE (PF) 100 MCG/2ML IJ SOLN
INTRAMUSCULAR | Status: AC
  Filled 2017-01-17: qty 2

## 2017-01-17 MED ORDER — LACTATED RINGERS IV SOLN
INTRAVENOUS | Status: DC
  Administered 2017-01-17: 12:00:00 via INTRAVENOUS

## 2017-01-17 MED ORDER — DEXAMETHASONE SODIUM PHOSPHATE 10 MG/ML IJ SOLN
INTRAMUSCULAR | Status: AC
Start: 2017-01-17 — End: ?
  Filled 2017-01-17: qty 1

## 2017-01-17 MED ORDER — OXYCODONE HCL 5 MG PO TABS: 5.0000 mg | ORAL_TABLET | Freq: Once | ORAL | Status: DC | PRN

## 2017-01-17 MED ORDER — FAMOTIDINE 20 MG PO TABS
ORAL_TABLET | ORAL | Status: AC
  Filled 2017-01-17: qty 1

## 2017-01-17 MED ORDER — FENTANYL CITRATE (PF) 100 MCG/2ML IJ SOLN: 25.0000 ug | INTRAMUSCULAR | Status: DC | PRN

## 2017-01-17 MED ORDER — PROPOFOL 10 MG/ML IV BOLUS
INTRAVENOUS | Status: DC | PRN
  Administered 2017-01-17: 100 mg via INTRAVENOUS

## 2017-01-17 MED ORDER — GLYCOPYRROLATE 0.2 MG/ML IJ SOLN
INTRAMUSCULAR | Status: AC
  Filled 2017-01-17: qty 1

## 2017-01-17 MED ORDER — ONDANSETRON HCL 4 MG PO TABS: 4.0000 mg | ORAL_TABLET | Freq: Four times a day (QID) | ORAL | Status: DC | PRN

## 2017-01-17 MED ORDER — GLYCOPYRROLATE 0.2 MG/ML IJ SOLN
INTRAMUSCULAR | Status: DC | PRN
  Administered 2017-01-17: 0.1 mg via INTRAVENOUS

## 2017-01-17 MED ORDER — DEXAMETHASONE SODIUM PHOSPHATE 10 MG/ML IJ SOLN
INTRAMUSCULAR | Status: DC | PRN
  Administered 2017-01-17: 5 mg via INTRAVENOUS

## 2017-01-17 MED ORDER — METOCLOPRAMIDE HCL 5 MG/ML IJ SOLN: 5.0000 mg | Freq: Three times a day (TID) | INTRAMUSCULAR | Status: DC | PRN

## 2017-01-17 MED ORDER — ONDANSETRON HCL 4 MG/2ML IJ SOLN
INTRAMUSCULAR | Status: AC
  Filled 2017-01-17: qty 2

## 2017-01-17 MED ORDER — FAMOTIDINE 20 MG PO TABS
20.0000 mg | ORAL_TABLET | Freq: Once | ORAL | Status: AC
  Administered 2017-01-17: 20 mg via ORAL

## 2017-01-17 MED ORDER — CEFAZOLIN SODIUM-DEXTROSE 2-4 GM/100ML-% IV SOLN
2.0000 g | Freq: Once | INTRAVENOUS | Status: AC
  Administered 2017-01-17: 2 g via INTRAVENOUS

## 2017-01-17 MED ORDER — MIDAZOLAM HCL 2 MG/2ML IJ SOLN
INTRAMUSCULAR | Status: AC
  Filled 2017-01-17: qty 2

## 2017-01-17 MED ORDER — CEFAZOLIN SODIUM-DEXTROSE 2-4 GM/100ML-% IV SOLN
INTRAVENOUS | Status: AC
  Filled 2017-01-17: qty 100

## 2017-01-17 MED ORDER — PROPOFOL 10 MG/ML IV BOLUS
INTRAVENOUS | Status: AC
  Filled 2017-01-17: qty 20

## 2017-01-17 MED ORDER — ONDANSETRON HCL 4 MG/2ML IJ SOLN: 4.0000 mg | Freq: Four times a day (QID) | INTRAMUSCULAR | Status: DC | PRN

## 2017-01-17 MED ORDER — OXYCODONE HCL 5 MG/5ML PO SOLN: 5.0000 mg | Freq: Once | ORAL | Status: DC | PRN

## 2017-01-17 MED ORDER — HYDROCODONE-ACETAMINOPHEN 5-325 MG PO TABS: 1.0000 | ORAL_TABLET | ORAL | Status: DC | PRN

## 2017-01-17 MED ORDER — SODIUM CHLORIDE FLUSH 0.9 % IV SOLN
INTRAVENOUS | Status: AC
  Administered 2017-01-17: 10 mL
  Filled 2017-01-17: qty 10

## 2017-01-17 MED ORDER — ACETAMINOPHEN 10 MG/ML IV SOLN
INTRAVENOUS | Status: AC
  Filled 2017-01-17: qty 100

## 2017-01-17 MED ORDER — HYDROCODONE-ACETAMINOPHEN 5-325 MG PO TABS: 1.0000 | ORAL_TABLET | Freq: Four times a day (QID) | ORAL | 0 refills | Status: DC | PRN

## 2017-01-17 MED ORDER — SODIUM CHLORIDE 0.9 % IJ SOLN
INTRAMUSCULAR | Status: AC
  Filled 2017-01-17: qty 10

## 2017-01-17 MED ORDER — NEOMYCIN-POLYMYXIN B GU 40-200000 IR SOLN
Status: DC | PRN
  Administered 2017-01-17: 14 mL

## 2017-01-17 MED ORDER — SUCCINYLCHOLINE CHLORIDE 20 MG/ML IJ SOLN
INTRAMUSCULAR | Status: AC
  Filled 2017-01-17: qty 1

## 2017-01-17 MED ORDER — ONDANSETRON HCL 4 MG/2ML IJ SOLN
INTRAMUSCULAR | Status: DC | PRN
  Administered 2017-01-17: 4 mg via INTRAVENOUS

## 2017-01-17 MED ORDER — ACETAMINOPHEN 10 MG/ML IV SOLN
INTRAVENOUS | Status: DC | PRN
  Administered 2017-01-17: 1000 mg via INTRAVENOUS

## 2017-01-17 MED ORDER — LIDOCAINE HCL (PF) 2 % IJ SOLN
INTRAMUSCULAR | Status: AC
  Filled 2017-01-17: qty 2

## 2017-01-17 MED ORDER — METOCLOPRAMIDE HCL 10 MG PO TABS: 5.0000 mg | ORAL_TABLET | Freq: Three times a day (TID) | ORAL | Status: DC | PRN

## 2017-01-17 MED ORDER — SEVOFLURANE IN SOLN
RESPIRATORY_TRACT | Status: AC
  Filled 2017-01-17: qty 250

## 2017-01-17 SURGICAL SUPPLY — 24 items
BANDAGE ELASTIC 3 CLIP NS LF (GAUZE/BANDAGES/DRESSINGS) ×4 IMPLANT
CANISTER SUCT 1200ML W/VALVE (MISCELLANEOUS) ×4 IMPLANT
CAST PADDING 3X4FT ST 30246 (SOFTGOODS) ×2
CHLORAPREP W/TINT 26ML (MISCELLANEOUS) ×4 IMPLANT
CUFF TOURN 18 STER (MISCELLANEOUS) IMPLANT
CUFF TOURN 24 STER (MISCELLANEOUS) IMPLANT
DRSG VAC ATS MED SENSATRAC (GAUZE/BANDAGES/DRESSINGS) ×2 IMPLANT
ELECT REM PT RETURN 9FT ADLT (ELECTROSURGICAL) ×4
ELECTRODE REM PT RTRN 9FT ADLT (ELECTROSURGICAL) ×2 IMPLANT
GAUZE PETRO XEROFOAM 1X8 (MISCELLANEOUS) ×4 IMPLANT
GAUZE SPONGE 4X4 12PLY STRL (GAUZE/BANDAGES/DRESSINGS) ×4 IMPLANT
GLOVE SURG SYN 9.0  PF PI (GLOVE) ×2
GLOVE SURG SYN 9.0 PF PI (GLOVE) ×2 IMPLANT
GOWN SRG 2XL LVL 4 RGLN SLV (GOWNS) ×2 IMPLANT
GOWN STRL NON-REIN 2XL LVL4 (GOWNS) ×4
GOWN STRL REUS W/ TWL LRG LVL3 (GOWN DISPOSABLE) ×2 IMPLANT
GOWN STRL REUS W/TWL LRG LVL3 (GOWN DISPOSABLE) ×4
KIT RM TURNOVER STRD PROC AR (KITS) ×4 IMPLANT
NS IRRIG 500ML POUR BTL (IV SOLUTION) ×4 IMPLANT
PACK EXTREMITY ARMC (MISCELLANEOUS) ×4 IMPLANT
PAD CAST CTTN 3X4 STRL (SOFTGOODS) ×2 IMPLANT
PAD PREP 24X41 OB/GYN DISP (PERSONAL CARE ITEMS) ×4 IMPLANT
PADDING CAST COTTON 3X4 STRL (SOFTGOODS) ×2
SUT ETHILON 4 0 P 3 18 (SUTURE) ×4 IMPLANT

## 2017-01-17 NOTE — OR Nursing (Signed)
Waiting on family to bring wound vac canister.

## 2017-01-17 NOTE — Anesthesia Procedure Notes (Signed)
Procedure Name: Intubation Date/Time: 01/17/2017 12:39 PM Performed by: Johnna Acosta Pre-anesthesia Checklist: Patient identified, Emergency Drugs available, Suction available, Timeout performed and Patient being monitored Patient Re-evaluated:Patient Re-evaluated prior to induction Oxygen Delivery Method: Circle system utilized Preoxygenation: Pre-oxygenation with 100% oxygen Induction Type: IV induction Ventilation: Mask ventilation without difficulty Laryngoscope Size: Miller and 2 Grade View: Grade III Tube type: Oral Tube size: 7.5 mm Number of attempts: 1 Airway Equipment and Method: Stylet Placement Confirmation: ETT inserted through vocal cords under direct vision,  positive ETCO2 and breath sounds checked- equal and bilateral Secured at: 21 cm Tube secured with: Tape Dental Injury: Teeth and Oropharynx as per pre-operative assessment  Difficulty Due To: Difficulty was anticipated and Difficult Airway- due to anterior larynx Future Recommendations: Recommend- induction with short-acting agent, and alternative techniques readily available

## 2017-01-17 NOTE — Transfer of Care (Signed)
Immediate Anesthesia Transfer of Care Note  Patient: [name]  Procedure(s) Performed: Procedure(s): INCISION AND DRAINAGE GLUTEAL ABSCESS (Right) APPLICATION OF WOUND VAC (Right)  Patient Location: PACU  Anesthesia Type:General  Level of Consciousness: responds to stimulation  Airway & Oxygen Therapy: Patient Spontanous Breathing and Patient connected to face mask oxygen  Post-op Assessment: Report given to RN and Post -op Vital signs reviewed and stable  Post vital signs: Reviewed and stable  Last Vitals:  Vitals:   01/17/17 1157 01/17/17 1325  BP: (!) 148/65 138/64  Pulse: 70 72  Resp: 18 19  Temp: (!) 36.2 C (!) 36.4 C  SpO2: 99% 100%    Last Pain:  Vitals:   01/17/17 1325  TempSrc: Temporal  PainSc: Asleep         Complications: No apparent anesthesia complications

## 2017-01-17 NOTE — OR Nursing (Signed)
PICC line flushed with 36ml normal saline after IV removal.

## 2017-01-17 NOTE — Anesthesia Postprocedure Evaluation (Signed)
Anesthesia Post Note  Patient: CELINES FEMIA  Procedure(s) Performed: Procedure(s) (LRB): INCISION AND DRAINAGE GLUTEAL ABSCESS (Right) APPLICATION OF WOUND VAC (Right)  Patient location during evaluation: PACU Anesthesia Type: General Level of consciousness: awake and alert Pain management: pain level controlled Vital Signs Assessment: post-procedure vital signs reviewed and stable Respiratory status: spontaneous breathing, nonlabored ventilation, respiratory function stable and patient connected to nasal cannula oxygen Cardiovascular status: blood pressure returned to baseline and stable Postop Assessment: no signs of nausea or vomiting Anesthetic complications: no     Last Vitals:  Vitals:   01/17/17 1400 01/17/17 1424  BP: (!) 149/49 (!) 135/48  Pulse: (!) 56 (!) 52  Resp: 16 16  Temp: 36.7 C   SpO2: 100% 99%    Last Pain:  Vitals:   01/17/17 1400  TempSrc:   PainSc: 2                  [name]

## 2017-01-17 NOTE — Op Note (Signed)
01/17/2017  1:17 PM  PATIENT:  [name]  83 y.o. female  PRE-OPERATIVE DIAGNOSIS:  gluteal abscess right  POST-OPERATIVE DIAGNOSIS:  gluteal abscess right  PROCEDURE:  Procedure(s): INCISION AND DRAINAGE GLUTEAL ABSCESS (Right) APPLICATION OF WOUND VAC (Right)  SURGEON: Laurene Footman, MD  ASSISTANTS: None  ANESTHESIA:   general  EBL:  Total I/O In: 200 [I.V.:200] Out: 20 [Blood:20]  BLOOD ADMINISTERED:none  DRAINS: none   LOCAL MEDICATIONS USED:  NONE  SPECIMEN:  No Specimen  DISPOSITION OF SPECIMEN:  N/A  COUNTS:  YES  TOURNIQUET:  * No tourniquets in log *  IMPLANTS: None  DICTATION: .Dragon Dictation patient was brought the operating room and after adequate general anesthesia was obtained she was placed in the lateral decubitus. Decubitus position. The right hip and buttock was prepped and draped in sterile fashion. Appropriate patient identification and timeout procedures were completed. There was a prior incision this was open elliptically excise the tract draining sinus. Proximally there was approximately a 4 x 4 by 2 cm cavity and distally there was a 8 x 5 x 2 cavity with pseudo-capsule present this is debrided using a rongeur and a curet and then the wound was thoroughly irrigated with pulsatile lavage. There was no gross pus present. White foam was then inserted proximally and a separate second strip distally into the cavities and black foam placed on top of this followed by application of the wound VAC patient isn't center recovery in stable condition  PLAN OF CARE: Discharge to home after PACU  PATIENT DISPOSITION:  PACU - hemodynamically stable.

## 2017-01-17 NOTE — Anesthesia Preprocedure Evaluation (Signed)
Anesthesia Evaluation  Patient identified by MRN, date of birth, ID band Patient awake    Reviewed: Allergy & Precautions, H&P , NPO status , Patient's Chart, lab work & pertinent test results  History of Anesthesia Complications Negative for: history of anesthetic complications  Airway Mallampati: III  TM Distance: <3 FB Neck ROM: limited    Dental  (+) Poor Dentition   Pulmonary neg pulmonary ROS, neg shortness of breath,    Pulmonary exam normal breath sounds clear to auscultation       Cardiovascular Exercise Tolerance: Good hypertension, (-) angina+ Peripheral Vascular Disease  (-) Past MI and (-) DOE Normal cardiovascular exam+ Valvular Problems/Murmurs  Rhythm:regular Rate:Normal     Neuro/Psych  Neuromuscular disease negative neurological ROS  negative psych ROS   GI/Hepatic Neg liver ROS, PUD, GERD  ,  Endo/Other  negative endocrine ROS  Renal/GU negative Renal ROS  negative genitourinary   Musculoskeletal  (+) Arthritis ,   Abdominal   Peds  Hematology negative hematology ROS (+)   Anesthesia Other Findings Past Medical History:   Hypertension                                                 Anemia                                                       DDD (degenerative disc disease), cervical                    DDD (degenerative disc disease), lumbar                      Elevated lipids                                              Osteoporosis                                                 Glaucoma                                                     Multiple gastric ulcers                                      Foot pain                                                    Colon polyp  Chronic low back pain                                        Carotid arterial disease ([place])                  01/09/2014      Comment:Overview:  Minimal on screening   Cancer ([place])                                    2016           Comment:bladder tumor  Past Surgical History:   BACK SURGERY                                                    Comment:x 6   CARPAL TUNNEL RELEASE                           Right              WRIST FRACTURE SURGERY                          Left              KNEE ARTHROSCOPY                                Right                Comment:x2   MANDIBLE SURGERY                                Bilateral                Comment:x2   CATARACT EXTRACTION                             Bilateral              TRANSURETHRAL RESECTION OF BLADDER TUMOR        N/A 09/29/2014       Comment:Procedure: TRANSURETHRAL RESECTION OF BLADDER               TUMOR (TURBT);  Surgeon: Hollice Espy, MD;                Location: [facility] ORS;  Service: Urology;                Laterality: N/A;   CYSTOSCOPY W/ RETROGRADES                       Bilateral 09/29/2014       Comment:Procedure: CYSTOSCOPY WITH RETROGRADE               PYELOGRAM;  Surgeon: Hollice Espy, MD;                Location: [facility] ORS;  Service: Urology;  Laterality: Bilateral;   EYE SURGERY                                                   CYSTOSCOPY WITH BIOPSY                          N/A 11/02/2014       Comment:Procedure: CYSTOSCOPY WITH BIOPSY/WITH               MITOMYCIN;  Surgeon: Hollice Espy, MD;                Location: [facility] ORS;  Service: Urology;                Laterality: N/A;   FOOT SURGERY                                    Right              ESOPHAGOGASTRODUODENOSCOPY (EGD) WITH PROPOFOL  N/A 05/10/2015     Comment:Procedure: ESOPHAGOGASTRODUODENOSCOPY (EGD)               WITH PROPOFOL;  Surgeon: Lollie Sails, MD;              Location: [facility] ENDOSCOPY;  Service: Endoscopy;               Laterality: N/A;   SHOULDER ARTHROSCOPY WITH ROTATOR CUFF REPAIR * Right 05/25/2015     Comment:Procedure: SHOULDER ARTHROSCOPY WITH ROTATOR               CUFF REPAIR AND  SUBACROMIAL DECOMPRESSION,               release long head biceps tendon;  Surgeon:               Leanor Kail, MD;  Location: [facility] ORS;                Service: Orthopedics;  Laterality: Right;   ABDOMINAL HYSTERECTOMY                           1973        BMI    Body Mass Index   27.03 kg/m 2      Reproductive/Obstetrics negative OB ROS                             Anesthesia Physical  Anesthesia Plan  ASA: III  Anesthesia Plan: General ETT   Post-op Pain Management:    Induction: Intravenous  PONV Risk Score and Plan:   Airway Management Planned: Oral ETT  Additional Equipment:   Intra-op Plan:   Post-operative Plan: Extubation in OR  Informed Consent: I have reviewed the patients History and Physical, chart, labs and discussed the procedure including the risks, benefits and alternatives for the proposed anesthesia with the patient or authorized representative who has indicated his/her understanding and acceptance.   Dental Advisory Given  Plan Discussed with: Anesthesiologist, CRNA and Surgeon  Anesthesia Plan Comments: (Patient consented for risks of anesthesia including but not limited to:  - adverse reactions to medications - damage to teeth, lips or other oral mucosa -  sore throat or hoarseness - Damage to heart, brain, lungs or loss of life  Patient voiced understanding.)        Anesthesia Quick Evaluation

## 2017-01-17 NOTE — OR Nursing (Signed)
Canisters brought to SDS. Canister attached to portable wound vac unit. Pt instructed on how to disconnect unit. All materials sent home with pt except for or canister and charger.

## 2017-01-17 NOTE — H&P (Signed)
Reviewed paper H+P, will be scanned into chart. No changes noted.  

## 2017-01-17 NOTE — Anesthesia Post-op Follow-up Note (Signed)
Anesthesia QCDR form completed.        

## 2017-01-17 NOTE — Discharge Instructions (Addendum)
Activity as tolerated. Keep wound VAC dry. Pain medicine as directed. Resume prior meds      AMBULATORY SURGERY  DISCHARGE INSTRUCTIONS   1) The drugs that you were given will stay in your system until tomorrow so for the next 24 hours you should not:  A) Drive an automobile B) Make any legal decisions C) Drink any alcoholic beverage   2) You may resume regular meals tomorrow.  Today it is better to start with liquids and gradually work up to solid foods.  You may eat anything you prefer, but it is better to start with liquids, then soup and crackers, and gradually work up to solid foods.   3) Please notify your doctor immediately if you have any unusual bleeding, trouble breathing, redness and pain at the surgery site, drainage, fever, or pain not relieved by medication.    4) Additional Instructions:        Please contact your physician with any problems or Same Day Surgery at [phone], Monday through Friday 6 am to 4 pm, or [place] [facility] number at [phone].

## 2017-01-18 ENCOUNTER — Encounter: Payer: Self-pay | Admitting: Orthopedic Surgery

## 2017-01-23 ENCOUNTER — Other Ambulatory Visit
Admission: RE | Admit: 2017-01-23 | Discharge: 2017-01-23 | Disposition: A | Discharge status: Home / Self Care | Payer: Medicare Other | Source: Other Acute Inpatient Hospital | Attending: Infectious Diseases | Admitting: Infectious Diseases

## 2017-01-23 DIAGNOSIS — L0231 Cutaneous abscess of buttock: Secondary | ICD-10-CM | POA: Insufficient documentation

## 2017-01-23 DIAGNOSIS — K6812 Psoas muscle abscess: Secondary | ICD-10-CM | POA: Insufficient documentation

## 2017-01-23 LAB — COMPREHENSIVE METABOLIC PANEL
ALK PHOS: 62 U/L (ref 38–126)
ALT: 13 U/L — ABNORMAL LOW (ref 14–54)
ANION GAP: 8 (ref 5–15)
AST: 18 U/L (ref 15–41)
Albumin: 3 g/dL — ABNORMAL LOW (ref 3.5–5.0)
BILIRUBIN TOTAL: 0.4 mg/dL (ref 0.3–1.2)
BUN: 15 mg/dL (ref 6–20)
CALCIUM: 9.1 mg/dL (ref 8.9–10.3)
CO2: 23 mmol/L (ref 22–32)
Chloride: 106 mmol/L (ref 101–111)
Creatinine, Ser: 1.11 mg/dL — ABNORMAL HIGH (ref 0.44–1.00)
GFR, EST AFRICAN AMERICAN: 52 mL/min — AB (ref >60)
GFR, EST NON AFRICAN AMERICAN: 45 mL/min — AB (ref >60)
GLUCOSE: 76 mg/dL (ref 65–99)
POTASSIUM: 4.1 mmol/L (ref 3.5–5.1)
Sodium: 137 mmol/L (ref 135–145)
TOTAL PROTEIN: 6.7 g/dL (ref 6.5–8.1)

## 2017-01-23 LAB — CBC WITH DIFFERENTIAL/PLATELET
BASOS PCT: 0 %
Basophils Absolute: 0 10*3/uL (ref 0–0.1)
EOS ABS: 0.1 10*3/uL (ref 0–0.7)
EOS PCT: 2 %
HCT: 26.7 % — ABNORMAL LOW (ref 35.0–47.0)
HEMOGLOBIN: 8.9 g/dL — AB (ref 12.0–16.0)
Lymphocytes Relative: 22 %
Lymphs Abs: 1.4 10*3/uL (ref 1.0–3.6)
MCH: 30.9 pg (ref 26.0–34.0)
MCHC: 33.6 g/dL (ref 32.0–36.0)
MCV: 92.2 fL (ref 80.0–100.0)
Monocytes Absolute: 0.6 10*3/uL (ref 0.2–0.9)
Monocytes Relative: 10 %
NEUTROS ABS: 4.3 10*3/uL (ref 1.4–6.5)
NEUTROS PCT: 66 %
PLATELETS: 283 10*3/uL (ref 150–440)
RBC: 2.89 MIL/uL — AB (ref 3.80–5.20)
RDW: 17.9 % — ABNORMAL HIGH (ref 11.5–14.5)
WBC: 6.5 10*3/uL (ref 3.6–11.0)

## 2017-01-23 LAB — VANCOMYCIN, TROUGH: VANCOMYCIN TR: 14 ug/mL — AB (ref 15–20)

## 2017-01-23 LAB — C-REACTIVE PROTEIN: CRP: 1.3 mg/dL — AB (ref <1.0)

## 2017-01-24 ENCOUNTER — Other Ambulatory Visit: Payer: Self-pay | Admitting: Internal Medicine

## 2017-01-24 DIAGNOSIS — Z1231 Encounter for screening mammogram for malignant neoplasm of breast: Secondary | ICD-10-CM

## 2017-01-30 ENCOUNTER — Other Ambulatory Visit
Admission: RE | Admit: 2017-01-30 | Discharge: 2017-01-30 | Disposition: A | Discharge status: Home / Self Care | Payer: Medicare Other | Source: Ambulatory Visit | Attending: Infectious Diseases | Admitting: Infectious Diseases

## 2017-01-30 DIAGNOSIS — L0231 Cutaneous abscess of buttock: Secondary | ICD-10-CM | POA: Insufficient documentation

## 2017-01-30 DIAGNOSIS — C679 Malignant neoplasm of bladder, unspecified: Secondary | ICD-10-CM | POA: Diagnosis present

## 2017-01-30 DIAGNOSIS — K6812 Psoas muscle abscess: Secondary | ICD-10-CM | POA: Insufficient documentation

## 2017-01-30 DIAGNOSIS — L03317 Cellulitis of buttock: Secondary | ICD-10-CM | POA: Diagnosis present

## 2017-01-30 LAB — CBC WITH DIFFERENTIAL/PLATELET
Basophils Absolute: 0 10*3/uL (ref 0–0.1)
Basophils Relative: 1 %
EOS ABS: 0.2 10*3/uL (ref 0–0.7)
Eosinophils Relative: 3 %
HCT: 28.7 % — ABNORMAL LOW (ref 35.0–47.0)
Hemoglobin: 9.4 g/dL — ABNORMAL LOW (ref 12.0–16.0)
LYMPHS ABS: 1.2 10*3/uL (ref 1.0–3.6)
Lymphocytes Relative: 21 %
MCH: 30.6 pg (ref 26.0–34.0)
MCHC: 32.9 g/dL (ref 32.0–36.0)
MCV: 93 fL (ref 80.0–100.0)
MONOS PCT: 12 %
Monocytes Absolute: 0.7 10*3/uL (ref 0.2–0.9)
Neutro Abs: 3.5 10*3/uL (ref 1.4–6.5)
Neutrophils Relative %: 63 %
PLATELETS: 240 10*3/uL (ref 150–440)
RBC: 3.08 MIL/uL — ABNORMAL LOW (ref 3.80–5.20)
RDW: 18.1 % — ABNORMAL HIGH (ref 11.5–14.5)
WBC: 5.5 10*3/uL (ref 3.6–11.0)

## 2017-01-30 LAB — COMPREHENSIVE METABOLIC PANEL
ALK PHOS: 79 U/L (ref 38–126)
ALT: 27 U/L (ref 14–54)
AST: 35 U/L (ref 15–41)
Albumin: 2.9 g/dL — ABNORMAL LOW (ref 3.5–5.0)
Anion gap: 7 (ref 5–15)
BUN: 13 mg/dL (ref 6–20)
CHLORIDE: 108 mmol/L (ref 101–111)
CO2: 23 mmol/L (ref 22–32)
CREATININE: 0.89 mg/dL (ref 0.44–1.00)
Calcium: 8.6 mg/dL — ABNORMAL LOW (ref 8.9–10.3)
GFR calc Af Amer: >60 mL/min (ref >60)
GFR, EST NON AFRICAN AMERICAN: 59 mL/min — AB (ref >60)
Glucose, Bld: 105 mg/dL — ABNORMAL HIGH (ref 65–99)
Potassium: 4.6 mmol/L (ref 3.5–5.1)
Sodium: 138 mmol/L (ref 135–145)
Total Bilirubin: 0.5 mg/dL (ref 0.3–1.2)
Total Protein: 6.5 g/dL (ref 6.5–8.1)

## 2017-01-30 LAB — C-REACTIVE PROTEIN: CRP: 1.4 mg/dL — AB (ref <1.0)

## 2017-01-30 LAB — VANCOMYCIN, TROUGH: VANCOMYCIN TR: 14 ug/mL — AB (ref 15–20)

## 2017-02-28 ENCOUNTER — Other Ambulatory Visit
Admission: RE | Admit: 2017-02-28 | Discharge: 2017-02-28 | Disposition: A | Discharge status: Home / Self Care | Payer: Medicare Other | Source: Ambulatory Visit | Attending: Infectious Diseases | Admitting: Infectious Diseases

## 2017-02-28 DIAGNOSIS — K6812 Psoas muscle abscess: Secondary | ICD-10-CM | POA: Diagnosis present

## 2017-02-28 LAB — BASIC METABOLIC PANEL
ANION GAP: 11 (ref 5–15)
BUN: 16 mg/dL (ref 6–20)
CALCIUM: 9.1 mg/dL (ref 8.9–10.3)
CHLORIDE: 105 mmol/L (ref 101–111)
CO2: 24 mmol/L (ref 22–32)
Creatinine, Ser: 1.64 mg/dL — ABNORMAL HIGH (ref 0.44–1.00)
GFR calc non Af Amer: 28 mL/min — ABNORMAL LOW (ref >60)
GFR, EST AFRICAN AMERICAN: 33 mL/min — AB (ref >60)
GLUCOSE: 101 mg/dL — AB (ref 65–99)
Potassium: 3.8 mmol/L (ref 3.5–5.1)
Sodium: 140 mmol/L (ref 135–145)

## 2017-02-28 LAB — VANCOMYCIN, TROUGH: Vancomycin Tr: 38 ug/mL (ref 15–20)

## 2017-03-07 ENCOUNTER — Ambulatory Visit
Admission: RE | Admit: 2017-03-07 | Discharge: 2017-03-07 | Disposition: A | Discharge status: Home / Self Care | Payer: Medicare Other | Source: Ambulatory Visit | Attending: Internal Medicine | Admitting: Internal Medicine

## 2017-03-07 DIAGNOSIS — Z1231 Encounter for screening mammogram for malignant neoplasm of breast: Secondary | ICD-10-CM | POA: Diagnosis not present

## 2017-04-12 ENCOUNTER — Ambulatory Visit: Payer: Medicare Other | Admitting: Urology

## 2017-04-12 ENCOUNTER — Encounter: Payer: Self-pay | Admitting: Urology

## 2017-04-12 VITALS — BP 94/58 | HR 81 | Ht 63.0 in | Wt 130.0 lb

## 2017-04-12 DIAGNOSIS — C679 Malignant neoplasm of bladder, unspecified: Secondary | ICD-10-CM

## 2017-04-12 LAB — URINALYSIS, COMPLETE
Bilirubin, UA: NEGATIVE
Glucose, UA: NEGATIVE
KETONES UA: NEGATIVE
NITRITE UA: NEGATIVE
Specific Gravity, UA: 1.025 (ref 1.005–1.030)
UUROB: 0.2 mg/dL (ref 0.2–1.0)
pH, UA: 5.5 (ref 5.0–7.5)

## 2017-04-12 LAB — MICROSCOPIC EXAMINATION: RBC MICROSCOPIC, UA: NONE SEEN /HPF (ref 0–?)

## 2017-04-12 MED ORDER — CIPROFLOXACIN HCL 500 MG PO TABS
500.0000 mg | ORAL_TABLET | Freq: Once | ORAL | Status: AC
  Administered 2017-04-12: 500 mg via ORAL

## 2017-04-12 MED ORDER — LIDOCAINE HCL 2 % EX GEL
1.0000 "application " | Freq: Once | CUTANEOUS | Status: AC
  Administered 2017-04-12: 1 via URETHRAL

## 2017-04-12 NOTE — Progress Notes (Signed)
10:59 AM  04/12/17   Alvis Pulcini Patman [birth year] [id number]  Referring provider: Kirk Ruths, MD [facility] [place], [place] [address]  Chief Complaint  Patient presents with  . Cysto    HPI: 83 year old nonsmoker with  incidental 11 mm papillary neoplasm near the RIGHT bladder neck. She was taken to the operating room on 09/29/2014 for TURBT, Bilateral retrograde pyelogramat which time a 2 cm extremely spherical mass was identified just proximal to the RIGHT UO. This was resected along with deeper biopsies of the base. Of note, bilateral retrograde pyelogram was negative for any obvious upper tract filling defects or hydronephrosis.   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 The tumor had an inverted architecture. Deeper biopsies did contain muscle after speaking with the pathologist today and the muscle was negative for any tumor. Additonally, CIS was noted.  She returned to the operating room on 11/02/2014 for restaging TURBT. Pathology showed no evidence of residual tumor.  She completed BCG x 6 for induction and first course of maintenence bcg x 3 doses  X 2.  Most recent maintainance dose completed in 07/2015.     Following her last course of BCG,  she issues with her gluteal abscess which has progressed tracking to her psoas and quadratus muscle and questionable involvement of her spinal hardware. She completed long term IV abx by Dr. Ola Spurr with PICC line.  She is now return to the operating room 5 times per report for I&D's related to this.  She continues to struggle with this.  She returns today for surveillance cystoscopy.   She denies dysuria or gross hematuria.   PMH: Past Medical History:  Diagnosis Date  . Anemia   . Cancer [facility]) 2016   bladder tumor  . Carotid arterial disease ([place]) 01/09/2014   Overview:  Minimal on screening   . Chronic low back pain   .  Colon polyp   . DDD (degenerative disc disease), cervical   . DDD (degenerative disc disease), lumbar   . Elevated lipids   . Foot pain   . GERD (gastroesophageal reflux disease)   . Glaucoma   . H/O concussion 04/2012   Due to fall at [facility]  . Heart murmur   . HOH (hard of hearing)    Bilateral  Hearing Aids  . Hypertension   . Multiple gastric ulcers   . Osteoporosis   . Thyroid disease     Surgical History: Past Surgical History:  Procedure Laterality Date  . ABDOMINAL HYSTERECTOMY  1973  . APPLICATION OF WOUND VAC Right 01/17/2017   Performed by Hessie Knows, MD at [facility] ORS  . BACK SURGERY     03/1975-ruptured disk, 11/1983-ruptured disk, 11/1984-ruptured disk, 10/1996-ruptured disk, 08/1997-spinal fusion, 02/10/07-spinal fusion [facility]  . BACK SURGERY     Spinal Fusion X 2  . CARPAL TUNNEL RELEASE Right   . CATARACT EXTRACTION Bilateral   . CYSTOSCOPY WITH BIOPSY/WITH MITOMYCIN N/A 11/02/2014   Performed by Hollice Espy, MD at [facility] ORS  . CYSTOSCOPY WITH RETROGRADE PYELOGRAM Bilateral 09/29/2014   Performed by Hollice Espy, MD at [facility] ORS  . DRAINAGE GLUTEAL ABSCESS Right 10/21/2015   Performed by Christene Lye, MD at [facility] ORS  . ESOPHAGOGASTRODUODENOSCOPY (EGD) WITH PROPOFOL N/A 05/10/2015   Performed by Lollie Sails, MD at [place]  . EYE SURGERY Bilateral    Cataract Extraction with IOL  .  FOOT SURGERY Right   . INCISION AND DRAINAGE ABSCESS GLUTEAL Right 11/20/2016   Performed by Christene Lye, MD at [facility] ORS  . INCISION AND DRAINAGE GLUTEAL ABSCESS Right 01/17/2017   Performed by Hessie Knows, MD at [facility] ORS  . KNEE ARTHROSCOPY Right    x2  . MANDIBLE SURGERY Bilateral    x2  . PICC LINE INSERTION Right 01/08/2017  . SHOULDER ARTHROSCOPY WITH ROTATOR CUFF REPAIR AND SUBACROMIAL DECOMPRESSION, release long head biceps tendon Right 05/25/2015   Performed by Leanor Kail, MD at [facility] ORS  . TRANSURETHRAL  RESECTION OF BLADDER TUMOR (TURBT) N/A 09/29/2014   Performed by Hollice Espy, MD at [facility] ORS  . WRIST FRACTURE SURGERY Left     Home Medications:  Allergies as of 04/12/2017   No Known Allergies     Medication List        Accurate as of 04/12/17 10:59 AM. Always use your most recent med list.          carvedilol 12.5 MG tablet Commonly known as:  COREG Take 12.5 mg by mouth 2 (two) times daily with a meal.   diltiazem 240 MG 24 hr capsule Commonly known as:  CARDIZEM CD Take 240 mg by mouth daily.   HEPARIN LOCK FLUSH IV Inject 1 Syringe into the vein 4 (four) times daily. Use after normal saline flush--after each antibiotic use   latanoprost 0.005 % ophthalmic solution Commonly known as:  XALATAN Place 1 drop into both eyes at bedtime.   pravastatin 40 MG tablet Commonly known as:  PRAVACHOL Take 40 mg by mouth at bedtime.   raloxifene 60 MG tablet Commonly known as:  EVISTA Take 60 mg by mouth daily.       Allergies:  No Known Allergies  Family History: Family History  Problem Relation Age of Onset  . Heart attack Father   . Emphysema Father   . Heart disease Mother   . Hypertension Mother   . Diabetes type II Sister   . Kidney disease Neg Hx   . Bladder Cancer Neg Hx   . Breast cancer Neg Hx     Social History:  reports that  has never smoked. she has never used smokeless tobacco. She reports that she does not drink alcohol or use drugs.   Physical Exam: BP (!) 94/58   Pulse 81   Ht 5\' 3"  (1.6 m)   Wt 130 lb (59 kg)   BMI 23.03 kg/m   Constitutional:  Alert and oriented, No acute distress. HEENT: [place] AT, moist mucus membranes.  Trachea midline, no masses. Cardiovascular: No clubbing, cyanosis, or edema. Respiratory: Normal respiratory effort, no increased work of breathing. GI: Abdomen is soft, nontender, nondistended, no abdominal masses  GU:  Normal external genitalia. Normal urethral meatus. Skin: No rashes, bruises or suspicious  lesions. Neurologic: Grossly intact, no focal deficits, moving all 4 extremities. Psychiatric: Normal mood and affect.  Laboratory Data: Lab Results  Component Value Date   WBC 5.5 01/30/2017   HGB 9.4 (L) 01/30/2017   HCT 28.7 (L) 01/30/2017   MCV 93.0 01/30/2017   PLT 240 01/30/2017    Urinalysis UA reviewed, no evidence of infection   Cystoscopy Procedure Note  Patient identification was confirmed, informed consent was obtained, and patient was prepped using Betadine solution.  Lidocaine jelly was administered per urethral meatus.    Preoperative abx where received prior to procedure.    Procedure: - Flexible cystoscope introduced, without any difficulty.   -  Thorough search of the bladder revealed:    normal urethral meatus    normal urothelium    no stones    no ulcers     no tumors    no urethral polyps    no trabeculation  - Ureteral orifices were normal in position and appearance.  Post-Procedure: - Patient tolerated the procedure well  Assessment & Plan:  83 year old female with a history of high-grade T1, Tis of the right bladder neck dx 09/2014 followed by induction BCG x 6, and most recently  maintenance BCG completed 04/2015 and 07/2015.  She returns today for surveillance cystoscopy.   1. Malignant neoplasm of bladder neck Cystoscopy today negative  Agreed to continue to abstain from BCG in the absence of recurrence and above history of recurrent gluteal abscess formation Continue q6 cystoscopy  Return in about 6 months (around 10/10/2017) for cystoscopy.     Hollice Espy, MD  [facility] [address] [place], [place], [place] [address] [phone]

## 2017-05-10 ENCOUNTER — Other Ambulatory Visit: Payer: Self-pay

## 2017-05-10 DIAGNOSIS — D509 Iron deficiency anemia, unspecified: Secondary | ICD-10-CM

## 2017-05-14 NOTE — Progress Notes (Signed)
[place]  [phone]  Fax:([phone]     [name] DOB: 18-Feb-1933  MR#: [id number]  ZWC#:[id number]  Patient Care Team: Kirk Ruths, MD as PCP - General (Internal Medicine) Hollice Espy, MD as Consulting Physician (Urology) Kirk Ruths, MD (Internal Medicine) Christene Lye, MD (General Surgery)  CHIEF COMPLAINT:  Iron deficiency anemia, unspecified. MGUS.  INTERVAL HISTORY: Patient returns to clinic today for repeat laboratory work and routine evaluation. She currently feels well and is asymptomatic. She continues to have problems with her back, but states it has improved after undergoing 5 separate surgeries. She has no neurologic complaints. She denies any recent fevers or illnesses. She has no chest pain or shortness of breath.  She denies any nausea, vomiting, constipation, or diarrhea. She has noted no melena or hematochezia. She has no urinary complaints.  Patient offers no further specific complaints today.   REVIEW OF SYSTEMS:   Review of Systems  Constitutional: Negative.  Negative for fever, malaise/fatigue and weight loss.  HENT: Negative for congestion and sinus pain.   Respiratory: Negative.  Negative for cough and shortness of breath.   Cardiovascular: Negative.  Negative for chest pain and leg swelling.  Gastrointestinal: Negative.  Negative for abdominal pain, blood in stool and melena.  Genitourinary: Negative.   Musculoskeletal: Positive for back pain.  Neurological: Negative.  Negative for dizziness, tingling, weakness and headaches.  Psychiatric/Behavioral: Negative.  The patient is not nervous/anxious and does not have insomnia.     As per HPI. Otherwise, a complete review of systems is negative.   PAST MEDICAL HISTORY: Past Medical History:  Diagnosis Date  . Anemia   . Cancer [facility]) 2016   bladder tumor  . Carotid arterial disease ([place]) 01/09/2014   Overview:  Minimal on screening    . Chronic low back pain   . Colon polyp   . DDD (degenerative disc disease), cervical   . DDD (degenerative disc disease), lumbar   . Elevated lipids   . Foot pain   . GERD (gastroesophageal reflux disease)   . Glaucoma   . H/O concussion 04/2012   Due to fall at [facility]  . Heart murmur   . HOH (hard of hearing)    Bilateral  Hearing Aids  . Hypertension   . Multiple gastric ulcers   . Osteoporosis   . Thyroid disease     PAST SURGICAL HISTORY: Past Surgical History:  Procedure Laterality Date  . ABDOMINAL HYSTERECTOMY  1973  . APPLICATION OF WOUND VAC Right 01/17/2017   Procedure: APPLICATION OF WOUND VAC;  Surgeon: Hessie Knows, MD;  Location: [facility] ORS;  Service: Orthopedics;  Laterality: Right;  . BACK SURGERY     03/1975-ruptured disk, 11/1983-ruptured disk, 11/1984-ruptured disk, 10/1996-ruptured disk, 08/1997-spinal fusion, 02/10/07-spinal fusion [facility]  . BACK SURGERY     Spinal Fusion X 2  . CARPAL TUNNEL RELEASE Right   . CATARACT EXTRACTION Bilateral   . CYSTOSCOPY W/ RETROGRADES Bilateral 09/29/2014   Procedure: CYSTOSCOPY WITH RETROGRADE PYELOGRAM;  Surgeon: Hollice Espy, MD;  Location: [facility] ORS;  Service: Urology;  Laterality: Bilateral;  . CYSTOSCOPY WITH BIOPSY N/A 11/02/2014   Procedure: CYSTOSCOPY WITH BIOPSY/WITH MITOMYCIN;  Surgeon: Hollice Espy, MD;  Location: [facility] ORS;  Service: Urology;  Laterality: N/A;  . ESOPHAGOGASTRODUODENOSCOPY (EGD) WITH PROPOFOL N/A 05/10/2015   Procedure: ESOPHAGOGASTRODUODENOSCOPY (EGD) WITH PROPOFOL;  Surgeon: Lollie Sails, MD;  Location[facility] ENDOSCOPY;  Service: Endoscopy;  Laterality: N/A;  . EYE  SURGERY Bilateral    Cataract Extraction with IOL  . FOOT SURGERY Right   . INCISION AND DRAINAGE ABSCESS Right 11/20/2016   Procedure: INCISION AND DRAINAGE ABSCESS GLUTEAL;  Surgeon: Christene Lye, MD;  Location: [facility] ORS;  Service: General;  Laterality: Right;  . INCISION AND DRAINAGE ABSCESS  Right 01/17/2017   Procedure: INCISION AND DRAINAGE GLUTEAL ABSCESS;  Surgeon: Hessie Knows, MD;  Location: [facility] ORS;  Service: Orthopedics;  Laterality: Right;  . IRRIGATION AND DEBRIDEMENT BUTTOCKS Right 10/21/2015   Procedure: DRAINAGE GLUTEAL ABSCESS;  Surgeon: Christene Lye, MD;  Location: [facility] ORS;  Service: General;  Laterality: Right;  . KNEE ARTHROSCOPY Right    x2  . MANDIBLE SURGERY Bilateral    x2  . PICC LINE INSERTION Right 01/08/2017  . SHOULDER ARTHROSCOPY WITH ROTATOR CUFF REPAIR AND SUBACROMIAL DECOMPRESSION Right 05/25/2015   Procedure: SHOULDER ARTHROSCOPY WITH ROTATOR CUFF REPAIR AND SUBACROMIAL DECOMPRESSION, release long head biceps tendon;  Surgeon: Leanor Kail, MD;  Location: [facility] ORS;  Service: Orthopedics;  Laterality: Right;  . TRANSURETHRAL RESECTION OF BLADDER TUMOR N/A 09/29/2014   Procedure: TRANSURETHRAL RESECTION OF BLADDER TUMOR (TURBT);  Surgeon: Hollice Espy, MD;  Location: [facility] ORS;  Service: Urology;  Laterality: N/A;  . WRIST FRACTURE SURGERY Left     FAMILY HISTORY Family History  Problem Relation Age of Onset  . Heart attack Father   . Emphysema Father   . Heart disease Mother   . Hypertension Mother   . Diabetes type II Sister   . Kidney disease Neg Hx   . Bladder Cancer Neg Hx   . Breast cancer Neg Hx     GYNECOLOGIC HISTORY:  No LMP recorded. Patient has had a hysterectomy.     ADVANCED DIRECTIVES:    HEALTH MAINTENANCE: Social History   Tobacco Use  . Smoking status: Never Smoker  . Smokeless tobacco: Never Used  Substance Use Topics  . Alcohol use: No  . Drug use: No   No Known Allergies  Current Outpatient Medications  Medication Sig Dispense Refill  . carvedilol (COREG) 12.5 MG tablet Take 12.5 mg by mouth 2 (two) times daily with a meal.    . diltiazem (CARDIZEM CD) 240 MG 24 hr capsule Take 240 mg by mouth daily.    [name] latanoprost (XALATAN) 0.005 % ophthalmic solution Place 1 drop into both eyes at  bedtime.    . pravastatin (PRAVACHOL) 40 MG tablet Take 40 mg by mouth at bedtime.     . raloxifene (EVISTA) 60 MG tablet Take 60 mg by mouth daily.    [name] sulfamethoxazole-trimethoprim (BACTRIM DS,SEPTRA DS) 800-160 MG tablet Take by mouth.    [name] amoxicillin-clavulanate (AUGMENTIN) 875-125 MG tablet Take 1 tablet by mouth 2 (two) times daily for 7 days. 14 tablet 0  . HEPARIN LOCK FLUSH IV Inject 1 Syringe into the vein 4 (four) times daily. Use after normal saline flush--after each antibiotic use    . oxymetazoline (AFRIN NASAL SPRAY) 0.05 % nasal spray Place 1 spray into both nostrils 2 (two) times daily for 3 days. 30 mL 0   Current Facility-Administered Medications  Medication Dose Route Frequency Provider Last Rate Last Dose  . BCG vaccine (THERACYS) injection 81 mg  81 mg Bladder Instillation Once Roda Shutters, FNP      . sodium chloride flush (NS) 0.9 % injection 50 mL  50 mL Intracatheter Once McGowan, Shannon A, PA-C        OBJECTIVE: BP 124/77  Pulse 64   Temp 98.3 F (36.8 C) (Tympanic)   Resp 20   Wt 141 lb 11.2 oz (64.3 kg)   BMI 25.10 kg/m    Body mass index is 25.1 kg/m.    ECOG FS:1 - Symptomatic but completely ambulatory  General: Well-developed, well-nourished, no acute distress. Eyes: Pink conjunctiva, anicteric sclera. Lungs: Clear to auscultation bilaterally. Heart: Regular rate and rhythm. No rubs, murmurs, or gallops. Abdomen: Soft, nontender, nondistended. No organomegaly noted, normoactive bowel sounds. Musculoskeletal: No edema, cyanosis, or clubbing.   Neuro: Alert, answering all questions appropriately. Cranial nerves grossly intact. Skin: No rashes or petechiae noted. Psych: Normal affect.   LAB RESULTS:  CBC    Component Value Date/Time   WBC 4.4 05/16/2017 0910   RBC 2.96 (L) 05/16/2017 0910   HGB 10.0 (L) 05/16/2017 0910   HGB 8.2 (L) 09/05/2015 0900   HCT 29.6 (L) 05/16/2017 0910   HCT 27.6 (L) 09/05/2015 0900   PLT 188 05/16/2017  0910   PLT 389 (H) 09/05/2015 0900   MCV 99.9 05/16/2017 0910   MCV 83 09/05/2015 0900   MCH 33.7 05/16/2017 0910   MCHC 33.7 05/16/2017 0910   RDW 14.8 (H) 05/16/2017 0910   RDW 17.6 (H) 09/05/2015 0900   LYMPHSABS 1.0 05/16/2017 0910   LYMPHSABS 1.8 09/05/2015 0900   MONOABS 0.5 05/16/2017 0910   EOSABS 0.1 05/16/2017 0910   EOSABS 0.1 09/05/2015 0900   BASOSABS 0.0 05/16/2017 0910   BASOSABS 0.0 09/05/2015 0900   Lab Results  Component Value Date   IRON 43 05/16/2017   TIBC 250 05/16/2017   IRONPCTSAT 17 05/16/2017   Lab Results  Component Value Date   FERRITIN 395 (H) 05/16/2017   Lab Results  Component Value Date   TOTALPROTELP 6.5 01/15/2017   ALBUMINELP 3.1 01/15/2017   A1GS 0.4 01/15/2017   A2GS 1.0 01/15/2017   BETS 0.9 01/15/2017   GAMS 1.2 01/15/2017   MSPIKE 0.9 (H) 01/15/2017   SPEI Comment 01/15/2017     STUDIES: Ct Head Wo Contrast  Result Date: 05/17/2017 CLINICAL DATA:  Fall, hit nose with bleeding EXAM: CT HEAD WITHOUT CONTRAST CT MAXILLOFACIAL WITHOUT CONTRAST TECHNIQUE: Multidetector CT imaging of the head and maxillofacial structures were performed using the standard protocol without intravenous contrast. Multiplanar CT image reconstructions of the maxillofacial structures were also generated. COMPARISON:  None. FINDINGS: CT HEAD FINDINGS Brain: No acute territorial infarction, hemorrhage or intracranial mass is visualized. Probable small old right temporal lobe infarct. Mild atrophy. Normal ventricle size Vascular: No hyperdense vessel. Carotid artery calcification. Vertebral artery calcifications Skull: No fracture Other: None CT MAXILLOFACIAL FINDINGS Osseous: Depression of the tip of the bilateral anterior nasal bones consistent with fracture. Adjacent foci of soft tissue. Bilateral mandibular heads are normally position. Mastoid air cells are clear. No acute mandibular fracture is seen. Postsurgical changes of the anterior mandible and at the angle  of the right mandible. Pterygoid plates and zygomatic arches are intact. Orbits: Globes are intact. No intra or extraconal soft tissue abnormality. No orbital fracture Sinuses: Completely opacified maxillary sinuses with high density secretions and calcifications. Moderate thickening in the ethmoid sinuses. Evidence of prior maxillary sinus fracture with multiple plate and screw fixation across the anterior and inferior walls of the maxillary sinuses. Bony thickening and remodeling, consistent with old trauma. Defects in the medial walls of both maxillary sinuses are presumably due to old trauma deformity. There is a linear lucency through the anterior maxillary bone which involves the  alveolar ridge, between the right and left central incisors, this extends to the anterior nasal process of maxilla. Soft tissues: Soft tissue swelling is present over the nose. IMPRESSION: 1. No CT evidence for acute intracranial abnormality.  Mild atrophy 2. Acute, mildly depressed fracture at the anterior tip of the nasal bones. Overlying soft tissue swelling 3. Evidence of prior maxillary sinus and mandibular fractures with extensive presumed chronic deformity of the walls of the maxillary sinus. Linear lucency through the anterior maxillary bone, through the alveolar ridge and extending to the anterior nasal process of the maxilla suspicious for a fracture, not certain if this is acute or chronic. 4. Opacified maxillary sinuses containing high density secretion and calcification. Electronically Signed   By: Donavan Foil M.D.   On: 05/17/2017 23:01   Ct Maxillofacial Wo Contrast  Result Date: 05/17/2017 CLINICAL DATA:  Fall, hit nose with bleeding EXAM: CT HEAD WITHOUT CONTRAST CT MAXILLOFACIAL WITHOUT CONTRAST TECHNIQUE: Multidetector CT imaging of the head and maxillofacial structures were performed using the standard protocol without intravenous contrast. Multiplanar CT image reconstructions of the maxillofacial structures  were also generated. COMPARISON:  None. FINDINGS: CT HEAD FINDINGS Brain: No acute territorial infarction, hemorrhage or intracranial mass is visualized. Probable small old right temporal lobe infarct. Mild atrophy. Normal ventricle size Vascular: No hyperdense vessel. Carotid artery calcification. Vertebral artery calcifications Skull: No fracture Other: None CT MAXILLOFACIAL FINDINGS Osseous: Depression of the tip of the bilateral anterior nasal bones consistent with fracture. Adjacent foci of soft tissue. Bilateral mandibular heads are normally position. Mastoid air cells are clear. No acute mandibular fracture is seen. Postsurgical changes of the anterior mandible and at the angle of the right mandible. Pterygoid plates and zygomatic arches are intact. Orbits: Globes are intact. No intra or extraconal soft tissue abnormality. No orbital fracture Sinuses: Completely opacified maxillary sinuses with high density secretions and calcifications. Moderate thickening in the ethmoid sinuses. Evidence of prior maxillary sinus fracture with multiple plate and screw fixation across the anterior and inferior walls of the maxillary sinuses. Bony thickening and remodeling, consistent with old trauma. Defects in the medial walls of both maxillary sinuses are presumably due to old trauma deformity. There is a linear lucency through the anterior maxillary bone which involves the alveolar ridge, between the right and left central incisors, this extends to the anterior nasal process of maxilla. Soft tissues: Soft tissue swelling is present over the nose. IMPRESSION: 1. No CT evidence for acute intracranial abnormality.  Mild atrophy 2. Acute, mildly depressed fracture at the anterior tip of the nasal bones. Overlying soft tissue swelling 3. Evidence of prior maxillary sinus and mandibular fractures with extensive presumed chronic deformity of the walls of the maxillary sinus. Linear lucency through the anterior maxillary bone,  through the alveolar ridge and extending to the anterior nasal process of the maxilla suspicious for a fracture, not certain if this is acute or chronic. 4. Opacified maxillary sinuses containing high density secretion and calcification. Electronically Signed   By: Donavan Foil M.D.   On: 05/17/2017 23:01    ASSESSMENT: Iron deficiency anemia, MGUS.  PLAN:   1. Iron deficiency anemia: The patient's iron stores remain within limits, her hemoglobin is trending down. Will proceed with one infusion of 510 mg IV Feraheme today. Patient has been instructed to continue her oral iron supplementation. It is also possible patient may have a component of MDS. Previously a bone marrow biopsy was discussed, but patient declined. She also may have a component  of persistent bone marrow suppression given the chronic inflammation secondary to the infection in her back. Return to clinic in 3 months with repeat laboratory work and further evaluation. 2. MGUS: Patient is most recent M spike on January 15, 2017 was reported 0.9. This appears to be approximately her baseline ranging from 0.7-1.1  Other than her anemia she has no evidence of endorgan disease.  Repeat laboratory work at next clinic visit. 3. Superficial bladder cancer: Continue follow-up and treatment per urology. 4. Elevated ferritin: Likely secondary to acute phase reaction given the infection in her back.  Patient expressed understanding and was in agreement with this plan. She also understands that She can call clinic at any time with any questions, concerns, or complaints.    Lloyd Huger, MD 05/18/17 11:23 AM

## 2017-05-16 ENCOUNTER — Inpatient Hospital Stay: Payer: Medicare Other

## 2017-05-16 ENCOUNTER — Other Ambulatory Visit: Payer: Self-pay

## 2017-05-16 ENCOUNTER — Inpatient Hospital Stay: Payer: Medicare Other | Attending: Oncology | Admitting: Oncology

## 2017-05-16 ENCOUNTER — Encounter: Payer: Self-pay | Admitting: Oncology

## 2017-05-16 VITALS — BP 124/77 | HR 64 | Temp 98.3°F | Resp 20 | Wt 141.7 lb

## 2017-05-16 DIAGNOSIS — G8929 Other chronic pain: Secondary | ICD-10-CM | POA: Diagnosis not present

## 2017-05-16 DIAGNOSIS — D472 Monoclonal gammopathy: Secondary | ICD-10-CM | POA: Diagnosis not present

## 2017-05-16 DIAGNOSIS — M5030 Other cervical disc degeneration, unspecified cervical region: Secondary | ICD-10-CM | POA: Diagnosis not present

## 2017-05-16 DIAGNOSIS — I10 Essential (primary) hypertension: Secondary | ICD-10-CM | POA: Diagnosis not present

## 2017-05-16 DIAGNOSIS — M549 Dorsalgia, unspecified: Secondary | ICD-10-CM | POA: Diagnosis not present

## 2017-05-16 DIAGNOSIS — B999 Unspecified infectious disease: Secondary | ICD-10-CM | POA: Insufficient documentation

## 2017-05-16 DIAGNOSIS — K219 Gastro-esophageal reflux disease without esophagitis: Secondary | ICD-10-CM | POA: Diagnosis not present

## 2017-05-16 DIAGNOSIS — H409 Unspecified glaucoma: Secondary | ICD-10-CM | POA: Diagnosis not present

## 2017-05-16 DIAGNOSIS — D509 Iron deficiency anemia, unspecified: Secondary | ICD-10-CM | POA: Insufficient documentation

## 2017-05-16 DIAGNOSIS — R7989 Other specified abnormal findings of blood chemistry: Secondary | ICD-10-CM | POA: Diagnosis not present

## 2017-05-16 DIAGNOSIS — Z79899 Other long term (current) drug therapy: Secondary | ICD-10-CM

## 2017-05-16 DIAGNOSIS — C679 Malignant neoplasm of bladder, unspecified: Secondary | ICD-10-CM | POA: Diagnosis not present

## 2017-05-16 DIAGNOSIS — Z9181 History of falling: Secondary | ICD-10-CM | POA: Diagnosis not present

## 2017-05-16 DIAGNOSIS — M810 Age-related osteoporosis without current pathological fracture: Secondary | ICD-10-CM | POA: Insufficient documentation

## 2017-05-16 DIAGNOSIS — E079 Disorder of thyroid, unspecified: Secondary | ICD-10-CM | POA: Diagnosis not present

## 2017-05-16 DIAGNOSIS — M5136 Other intervertebral disc degeneration, lumbar region: Secondary | ICD-10-CM | POA: Insufficient documentation

## 2017-05-16 DIAGNOSIS — Z792 Long term (current) use of antibiotics: Secondary | ICD-10-CM | POA: Insufficient documentation

## 2017-05-16 LAB — CBC WITH DIFFERENTIAL/PLATELET
BASOS ABS: 0 10*3/uL (ref 0–0.1)
Basophils Relative: 1 %
EOS PCT: 2 %
Eosinophils Absolute: 0.1 10*3/uL (ref 0–0.7)
HEMATOCRIT: 29.6 % — AB (ref 35.0–47.0)
Hemoglobin: 10 g/dL — ABNORMAL LOW (ref 12.0–16.0)
LYMPHS ABS: 1 10*3/uL (ref 1.0–3.6)
LYMPHS PCT: 23 %
MCH: 33.7 pg (ref 26.0–34.0)
MCHC: 33.7 g/dL (ref 32.0–36.0)
MCV: 99.9 fL (ref 80.0–100.0)
MONO ABS: 0.5 10*3/uL (ref 0.2–0.9)
MONOS PCT: 12 %
NEUTROS ABS: 2.7 10*3/uL (ref 1.4–6.5)
Neutrophils Relative %: 62 %
Platelets: 188 10*3/uL (ref 150–440)
RBC: 2.96 MIL/uL — ABNORMAL LOW (ref 3.80–5.20)
RDW: 14.8 % — ABNORMAL HIGH (ref 11.5–14.5)
WBC: 4.4 10*3/uL (ref 3.6–11.0)

## 2017-05-16 LAB — IRON AND TIBC
IRON: 43 ug/dL (ref 28–170)
Saturation Ratios: 17 % (ref 10.4–31.8)
TIBC: 250 ug/dL (ref 250–450)
UIBC: 207 ug/dL

## 2017-05-16 LAB — FERRITIN: FERRITIN: 395 ng/mL — AB (ref 11–307)

## 2017-05-16 MED ORDER — SODIUM CHLORIDE 0.9 % IV SOLN
Freq: Once | INTRAVENOUS | Status: AC
  Administered 2017-05-16: 11:00:00 via INTRAVENOUS
  Filled 2017-05-16: qty 1000

## 2017-05-16 MED ORDER — FERUMOXYTOL INJECTION 510 MG/17 ML
510.0000 mg | Freq: Once | INTRAVENOUS | Status: AC
  Administered 2017-05-16: 510 mg via INTRAVENOUS
  Filled 2017-05-16: qty 17

## 2017-05-16 NOTE — Progress Notes (Signed)
Patient denies any concerns today.  

## 2017-05-17 ENCOUNTER — Emergency Department
Admission: EM | Admit: 2017-05-17 | Discharge: 2017-05-17 | Disposition: A | Discharge status: Home / Self Care | Payer: Medicare Other | Attending: Emergency Medicine | Admitting: Emergency Medicine

## 2017-05-17 ENCOUNTER — Other Ambulatory Visit: Payer: Self-pay

## 2017-05-17 ENCOUNTER — Emergency Department: Payer: Medicare Other

## 2017-05-17 ENCOUNTER — Encounter: Payer: Self-pay | Admitting: Emergency Medicine

## 2017-05-17 DIAGNOSIS — Z7902 Long term (current) use of antithrombotics/antiplatelets: Secondary | ICD-10-CM | POA: Diagnosis not present

## 2017-05-17 DIAGNOSIS — Z8551 Personal history of malignant neoplasm of bladder: Secondary | ICD-10-CM | POA: Insufficient documentation

## 2017-05-17 DIAGNOSIS — Y929 Unspecified place or not applicable: Secondary | ICD-10-CM | POA: Insufficient documentation

## 2017-05-17 DIAGNOSIS — Z79899 Other long term (current) drug therapy: Secondary | ICD-10-CM | POA: Insufficient documentation

## 2017-05-17 DIAGNOSIS — S0993XA Unspecified injury of face, initial encounter: Secondary | ICD-10-CM | POA: Diagnosis present

## 2017-05-17 DIAGNOSIS — Y999 Unspecified external cause status: Secondary | ICD-10-CM | POA: Diagnosis not present

## 2017-05-17 DIAGNOSIS — S022XXA Fracture of nasal bones, initial encounter for closed fracture: Secondary | ICD-10-CM | POA: Diagnosis not present

## 2017-05-17 DIAGNOSIS — I10 Essential (primary) hypertension: Secondary | ICD-10-CM | POA: Diagnosis not present

## 2017-05-17 DIAGNOSIS — Y9389 Activity, other specified: Secondary | ICD-10-CM | POA: Insufficient documentation

## 2017-05-17 DIAGNOSIS — S0990XA Unspecified injury of head, initial encounter: Secondary | ICD-10-CM | POA: Insufficient documentation

## 2017-05-17 DIAGNOSIS — W010XXA Fall on same level from slipping, tripping and stumbling without subsequent striking against object, initial encounter: Secondary | ICD-10-CM | POA: Diagnosis not present

## 2017-05-17 DIAGNOSIS — I2510 Atherosclerotic heart disease of native coronary artery without angina pectoris: Secondary | ICD-10-CM | POA: Diagnosis not present

## 2017-05-17 MED ORDER — OXYMETAZOLINE HCL 0.05 % NA SOLN
1.0000 | Freq: Once | NASAL | Status: AC
  Administered 2017-05-17: 1 via NASAL
  Filled 2017-05-17: qty 15

## 2017-05-17 MED ORDER — OXYMETAZOLINE HCL 0.05 % NA SOLN: 1.0000 | Freq: Two times a day (BID) | NASAL | 0 refills | Status: AC

## 2017-05-17 MED ORDER — AMOXICILLIN-POT CLAVULANATE 875-125 MG PO TABS: 1.0000 | ORAL_TABLET | Freq: Two times a day (BID) | ORAL | 0 refills | Status: AC

## 2017-05-17 NOTE — Discharge Instructions (Signed)
Use the nasal spray as prescribed, and take the antibiotic until you follow-up with the ENT.  Return to the emergency department for new or worsening pain, bleeding, persistent nausea or vomiting, confusion or change in mental status, or any other new or worsening symptoms that concern you.

## 2017-05-17 NOTE — ED Provider Notes (Signed)
[facility] Emergency Department Provider Note ____________________________________________   First MD Initiated Contact with Patient 05/17/17 2144     (approximate)  I have reviewed the triage vital signs and the nursing notes.   HISTORY  Chief Complaint Fall and Facial Injury    HPI [name] is a 83 y.o. female with past medical history as noted below who presents with facial injury, acute onset when patient fell from standing height, associated with bleeding from the nose, and not associated with LOC or severe headache.  Patient denies any nausea or vomiting.  Patient states that she has had some mobility and balance issues, and often when she is sitting for a while when she stands up quickly she begins severe to the right and can fall.  She states that this has been the case for a long time and there has been no acute change.  She states she got up very quickly to try to get to the phone, and fell at this time, but does not remember specifically tripping or losing her footing.  She also denies feel lightheaded or dizzy, or having any LOC before after the fall  Past Medical History:  Diagnosis Date  . Anemia   . Cancer [facility]) 2016   bladder tumor  . Carotid arterial disease ([place]) 01/09/2014   Overview:  Minimal on screening   . Chronic low back pain   . Colon polyp   . DDD (degenerative disc disease), cervical   . DDD (degenerative disc disease), lumbar   . Elevated lipids   . Foot pain   . GERD (gastroesophageal reflux disease)   . Glaucoma   . H/O concussion 04/2012   Due to fall at [facility]  . Heart murmur   . HOH (hard of hearing)    Bilateral  Hearing Aids  . Hypertension   . Multiple gastric ulcers   . Osteoporosis   . Thyroid disease     Patient Active Problem List   Diagnosis Date Noted  . Monoclonal gammopathy of unknown significance (MGUS) 06/11/2016  . Abscess of gluteal region 02/21/2016  . Psoas  abscess ([place]) 02/21/2016  . Malignant neoplasm of urinary bladder ([place]) 08/23/2015  . History of repair of rotator cuff 08/23/2015  . Encounter for general adult medical examination without abnormal findings 08/04/2015  . Impingement syndrome of shoulder 03/30/2015  . Impingement syndrome of right shoulder 03/30/2015  . Anemia, iron deficiency 02/02/2015  . IDA (iron deficiency anemia) 01/26/2015  . Malignant neoplasm of bladder neck ([place]) 12/30/2014  . Colon polyp 12/17/2014  . Inflammation of sacroiliac joint ([place]) 09/22/2014  . Carotid arterial disease ([place]) 01/09/2014  . Combined fat and carbohydrate induced hyperlipemia 01/09/2014  . Accumulation of fluid in tissues 01/09/2014  . BP (high blood pressure) 01/09/2014  . Arthritis, degenerative 01/09/2014  . OP (osteoporosis) 01/09/2014  . Carotid artery disease ([place]) 01/09/2014  . Edema 01/09/2014  . Cervical osteoarthritis 10/28/2013  . DDD (degenerative disc disease), cervical 10/28/2013  . Acute blood loss anemia 05/16/2012  . Fracture of parietal bone ([place]) 05/16/2012  . Fall down stairs 05/16/2012  . Hemorrhage into subarachnoid space of neuraxis ([place]) 05/16/2012  . Subdural hematoma ([place]) 05/16/2012  . Fracture of temporal bone ([place]) 05/16/2012    Past Surgical History:  Procedure Laterality Date  . ABDOMINAL HYSTERECTOMY  1973  . APPLICATION OF WOUND VAC Right 01/17/2017   Procedure: APPLICATION OF WOUND VAC;  Surgeon: Hessie Knows, MD;  Location: [facility] ORS;  Service: Orthopedics;  Laterality: Right;  . BACK SURGERY     03/1975-ruptured disk, 11/1983-ruptured disk, 11/1984-ruptured disk, 10/1996-ruptured disk, 08/1997-spinal fusion, 02/10/07-spinal fusion [facility]  . BACK SURGERY     Spinal Fusion X 2  . CARPAL TUNNEL RELEASE Right   . CATARACT EXTRACTION Bilateral   . CYSTOSCOPY W/ RETROGRADES Bilateral 09/29/2014   Procedure: CYSTOSCOPY WITH RETROGRADE PYELOGRAM;  Surgeon: Hollice Espy, MD;  Location: [facility] ORS;   Service: Urology;  Laterality: Bilateral;  . CYSTOSCOPY WITH BIOPSY N/A 11/02/2014   Procedure: CYSTOSCOPY WITH BIOPSY/WITH MITOMYCIN;  Surgeon: Hollice Espy, MD;  Location: [facility] ORS;  Service: Urology;  Laterality: N/A;  . ESOPHAGOGASTRODUODENOSCOPY (EGD) WITH PROPOFOL N/A 05/10/2015   Procedure: ESOPHAGOGASTRODUODENOSCOPY (EGD) WITH PROPOFOL;  Surgeon: Lollie Sails, MD;  Location: [facility] ENDOSCOPY;  Service: Endoscopy;  Laterality: N/A;  . EYE SURGERY Bilateral    Cataract Extraction with IOL  . FOOT SURGERY Right   . INCISION AND DRAINAGE ABSCESS Right 11/20/2016   Procedure: INCISION AND DRAINAGE ABSCESS GLUTEAL;  Surgeon: Christene Lye, MD;  Location: [facility] ORS;  Service: General;  Laterality: Right;  . INCISION AND DRAINAGE ABSCESS Right 01/17/2017   Procedure: INCISION AND DRAINAGE GLUTEAL ABSCESS;  Surgeon: Hessie Knows, MD;  Location: [facility] ORS;  Service: Orthopedics;  Laterality: Right;  . IRRIGATION AND DEBRIDEMENT BUTTOCKS Right 10/21/2015   Procedure: DRAINAGE GLUTEAL ABSCESS;  Surgeon: Christene Lye, MD;  Location: [facility] ORS;  Service: General;  Laterality: Right;  . KNEE ARTHROSCOPY Right    x2  . MANDIBLE SURGERY Bilateral    x2  . PICC LINE INSERTION Right 01/08/2017  . SHOULDER ARTHROSCOPY WITH ROTATOR CUFF REPAIR AND SUBACROMIAL DECOMPRESSION Right 05/25/2015   Procedure: SHOULDER ARTHROSCOPY WITH ROTATOR CUFF REPAIR AND SUBACROMIAL DECOMPRESSION, release long head biceps tendon;  Surgeon: Leanor Kail, MD;  Location: [facility] ORS;  Service: Orthopedics;  Laterality: Right;  . TRANSURETHRAL RESECTION OF BLADDER TUMOR N/A 09/29/2014   Procedure: TRANSURETHRAL RESECTION OF BLADDER TUMOR (TURBT);  Surgeon: Hollice Espy, MD;  Location: [facility] ORS;  Service: Urology;  Laterality: N/A;  . WRIST FRACTURE SURGERY Left     Prior to Admission medications   Medication Sig Start Date End Date Taking? Authorizing Provider  amoxicillin-clavulanate (AUGMENTIN) 875-125 MG  tablet Take 1 tablet by mouth 2 (two) times daily for 7 days. 05/17/17 05/24/17  Arta Silence, MD  carvedilol (COREG) 12.5 MG tablet Take 12.5 mg by mouth 2 (two) times daily with a meal.    [provider]  diltiazem (CARDIZEM CD) 240 MG 24 hr capsule Take 240 mg by mouth daily.    [provider]  HEPARIN LOCK FLUSH IV Inject 1 Syringe into the vein 4 (four) times daily. Use after normal saline flush--after each antibiotic use    [provider]  latanoprost (XALATAN) 0.005 % ophthalmic solution Place 1 drop into both eyes at bedtime.    [provider]  oxymetazoline (AFRIN NASAL SPRAY) 0.05 % nasal spray Place 1 spray into both nostrils 2 (two) times daily for 3 days. 05/17/17 05/20/17  Arta Silence, MD  pravastatin (PRAVACHOL) 40 MG tablet Take 40 mg by mouth at bedtime.     [provider]  raloxifene (EVISTA) 60 MG tablet Take 60 mg by mouth daily.    [provider]  sulfamethoxazole-trimethoprim (BACTRIM DS,SEPTRA DS) 800-160 MG tablet Take by mouth. 04/22/17   [provider]    Allergies Patient has no known allergies.  Family History  Problem Relation Age of Onset  . Heart attack Father   . Emphysema Father   . Heart disease Mother   . Hypertension Mother   . Diabetes type II Sister   . Kidney disease Neg Hx   . Bladder Cancer Neg Hx   . Breast cancer Neg Hx     Social History Social History   Tobacco Use  . Smoking status: Never Smoker  . Smokeless tobacco: Never Used  Substance Use Topics  . Alcohol use: No  . Drug use: No    Review of Systems  Constitutional: No fever. Eyes: No visual changes.  ENT: No neck pain.  Positive for mild epistaxis. Cardiovascular: Denies chest pain. Respiratory: Denies shortness of breath. Gastrointestinal: No nausea, no vomiting.  Genitourinary: Negative for flank pain.  Musculoskeletal: Negative for back pain. Skin: Negative for rash. Neurological:  Negative for headache.   ____________________________________________   PHYSICAL EXAM:  VITAL SIGNS: ED Triage Vitals  Enc Vitals Group     BP 05/17/17 2126 (!) 145/84     Pulse Rate 05/17/17 2126 83     Resp 05/17/17 2126 20     Temp 05/17/17 2126 98.3 F (36.8 C)     Temp Source 05/17/17 2126 Oral     SpO2 05/17/17 2126 100 %     Weight 05/17/17 2127 141 lb (64 kg)     Height 05/17/17 2127 5\' 1"  (1.549 m)     Head Circumference --      Peak Flow --      Pain Score --      Pain Loc --      Pain Edu? --      Excl. in [place]? --     Constitutional: Alert and oriented. Well appearing and in no acute distress. Eyes: Conjunctivae are normal.  EOMI.  PERRLA. Head: 1 cm superficial abrasion to nose. Nose: No congestion/rhinnorhea.  Mild swelling to nose with tenderness.  No nasal septal hematoma. Mouth/Throat: Mucous membranes are moist.   Neck: Normal range of motion.  Cervical spine nontender. Cardiovascular: Normal rate, regular rhythm. Grossly normal heart sounds.  Good peripheral circulation. Respiratory: Normal respiratory effort.  No retractions. Lungs CTAB. Gastrointestinal: Soft and nontender. No distention.  Genitourinary: No flank tenderness. Musculoskeletal: Extremities warm and well perfused.  Neurologic:  Normal speech and language. No gross focal neurologic deficits are appreciated.  Motor and sensory intact in all extremities.  Normal coordination, and no ataxia. Skin:  Skin is warm and dry. No rash noted. Psychiatric: Mood and affect are normal. Speech and behavior are normal.  ____________________________________________   LABS (all labs ordered are listed, but only abnormal results are displayed)  Labs Reviewed - No data to display ____________________________________________  EKG   ____________________________________________  RADIOLOGY  CT head: No ICH or other acute findings CT maxillofacial: Nasal bone fracture  nondisplaced  ____________________________________________   PROCEDURES  Procedure(s) performed: No    Critical Care performed: No ____________________________________________   INITIAL IMPRESSION / ASSESSMENT AND PLAN / ED COURSE  Pertinent labs & imaging results that were available during my care of the patient were reviewed by me and considered in my medical decision making (see chart for details).  83 year old female with past medical history as noted above presents with facial injury after a fall from standing height; patient did not have a prodrome of dizziness or lightheadedness, but states that when she got up quickly she suddenly fell, which is similar to episodes she has had in the past  due to her ongoing mobility problems.  On exam, vital signs are normal except for slight hypertension, the patient is well-appearing, the remainder the exam is as described above with primarily swelling and tenderness to the nose.  Past medical records reviewed in Epic and are noncontributory.  Patient is not on anticoagulation.  Differential includes nasal bone fracture versus contusion.  I have low suspicion for intracranial injury but we will obtain CT given patient's age.  Anticipate discharge home if no concerning findings on imaging.    ----------------------------------------- 11:41 PM on 05/17/2017 -----------------------------------------  CT confirms nasal bone fracture.  The maxillary sinus findings are likely due to prior maxillofacial surgery that the patient had previously, as she denies any other facial trauma and has no tenderness to the maxillary bones on exam currently.  I will prescribe the patient with Afrin and Augmentin, and I have given her an ENT referral.  I explained the discharge plan and gave return precautions to the patient and her son, and they expressed understanding. ____________________________________________   FINAL CLINICAL IMPRESSION(S) / ED  DIAGNOSES  Final diagnoses:  Closed fracture of nasal bone, initial encounter  Minor head injury, initial encounter      NEW MEDICATIONS STARTED DURING THIS VISIT:  This SmartLink is deprecated. Use AVSMEDLIST instead to display the medication list for a patient.   Note:  This document was prepared using Dragon voice recognition software and may include unintentional dictation errors.    Arta Silence, MD 05/17/17 [phone]

## 2017-05-17 NOTE — ED Notes (Signed)
Pt denies LOC or back or neck pain at this time, pt also denies numbness/tingling down spine or legs.

## 2017-05-17 NOTE — ED Triage Notes (Signed)
Pt reports she was getting up from sitting position  to answer phone,lost her balance and fell face forward hitting her nose, reports nose bleeding at home has resolved by time she got to ER. Pt talks in complete sentences reports she can't breath through her nose. Pt denies passing out, denies taking any blood thinners.

## 2017-05-17 NOTE — ED Notes (Signed)
Patient transported to CT 

## 2017-05-17 NOTE — ED Notes (Signed)

## 2017-08-11 NOTE — Progress Notes (Signed)
[facility]  Telephone:([phone]) [phone]  Fax:([phone]) [phone]     [name] DOB: 01/16/1933  MR#: [id number]  MHD#:[id number]  Patient Care Team: Kirk Ruths, MD as PCP - General (Internal Medicine) Hollice Espy, MD as Consulting Physician (Urology) Kirk Ruths, MD (Internal Medicine) Christene Lye, MD (General Surgery)  CHIEF COMPLAINT:  Iron deficiency anemia, unspecified. MGUS.  INTERVAL HISTORY: Patient returns to clinic today for repeat laboratory work and routine evaluation. She currently feels well and is asymptomatic.  Her back pain is significantly improved and she is nearly fully recovered from multiple surgeries, although she remains on antibiotics prophylactically. She has no neurologic complaints. She denies any recent fevers or illnesses. She has no chest pain or shortness of breath.  She denies any nausea, vomiting, constipation, or diarrhea. She has noted no melena or hematochezia. She has no urinary complaints.  Patient offers no further specific complaints today.   REVIEW OF SYSTEMS:   Review of Systems  Constitutional: Negative.  Negative for fever, malaise/fatigue and weight loss.  HENT: Negative for congestion and sinus pain.   Respiratory: Negative.  Negative for cough and shortness of breath.   Cardiovascular: Negative.  Negative for chest pain and leg swelling.  Gastrointestinal: Negative.  Negative for abdominal pain, blood in stool and melena.  Genitourinary: Negative.   Musculoskeletal: Positive for back pain.  Neurological: Negative.  Negative for dizziness, tingling, weakness and headaches.  Psychiatric/Behavioral: Negative.  The patient is not nervous/anxious and does not have insomnia.     As per HPI. Otherwise, a complete review of systems is negative.   PAST MEDICAL HISTORY: Past Medical History:  Diagnosis Date  . Anemia   . Cancer [facility]) 2016   bladder tumor  . Carotid arterial disease ([place])  01/09/2014   Overview:  Minimal on screening   . Chronic low back pain   . Colon polyp   . DDD (degenerative disc disease), cervical   . DDD (degenerative disc disease), lumbar   . Elevated lipids   . Foot pain   . GERD (gastroesophageal reflux disease)   . Glaucoma   . H/O concussion 04/2012   Due to fall at [facility]  . Heart murmur   . HOH (hard of hearing)    Bilateral  Hearing Aids  . Hypertension   . Multiple gastric ulcers   . Osteoporosis   . Thyroid disease     PAST SURGICAL HISTORY: Past Surgical History:  Procedure Laterality Date  . ABDOMINAL HYSTERECTOMY  1973  . APPLICATION OF WOUND VAC Right 01/17/2017   Procedure: APPLICATION OF WOUND VAC;  Surgeon: Hessie Knows, MD;  Location: [facility] ORS;  Service: Orthopedics;  Laterality: Right;  . BACK SURGERY     03/1975-ruptured disk, 11/1983-ruptured disk, 11/1984-ruptured disk, 10/1996-ruptured disk, 08/1997-spinal fusion, 02/10/07-spinal fusion [facility]  . BACK SURGERY     Spinal Fusion X 2  . CARPAL TUNNEL RELEASE Right   . CATARACT EXTRACTION Bilateral   . CYSTOSCOPY W/ RETROGRADES Bilateral 09/29/2014   Procedure: CYSTOSCOPY WITH RETROGRADE PYELOGRAM;  Surgeon: Hollice Espy, MD;  Location: [facility] ORS;  Service: Urology;  Laterality: Bilateral;  . CYSTOSCOPY WITH BIOPSY N/A 11/02/2014   Procedure: CYSTOSCOPY WITH BIOPSY/WITH MITOMYCIN;  Surgeon: Hollice Espy, MD;  Location: [facility] ORS;  Service: Urology;  Laterality: N/A;  . ESOPHAGOGASTRODUODENOSCOPY (EGD) WITH PROPOFOL N/A 05/10/2015   Procedure: ESOPHAGOGASTRODUODENOSCOPY (EGD) WITH PROPOFOL;  Surgeon: Lollie Sails, MD;  Location[facility] ENDOSCOPY;  Service: Endoscopy;  Laterality:  N/A;  . EYE SURGERY Bilateral    Cataract Extraction with IOL  . FOOT SURGERY Right   . INCISION AND DRAINAGE ABSCESS Right 11/20/2016   Procedure: INCISION AND DRAINAGE ABSCESS GLUTEAL;  Surgeon: Christene Lye, MD;  Location: [facility] ORS;  Service: General;   Laterality: Right;  . INCISION AND DRAINAGE ABSCESS Right 01/17/2017   Procedure: INCISION AND DRAINAGE GLUTEAL ABSCESS;  Surgeon: Hessie Knows, MD;  Location: [facility] ORS;  Service: Orthopedics;  Laterality: Right;  . IRRIGATION AND DEBRIDEMENT BUTTOCKS Right 10/21/2015   Procedure: DRAINAGE GLUTEAL ABSCESS;  Surgeon: Christene Lye, MD;  Location: [facility] ORS;  Service: General;  Laterality: Right;  . KNEE ARTHROSCOPY Right    x2  . MANDIBLE SURGERY Bilateral    x2  . PICC LINE INSERTION Right 01/08/2017  . SHOULDER ARTHROSCOPY WITH ROTATOR CUFF REPAIR AND SUBACROMIAL DECOMPRESSION Right 05/25/2015   Procedure: SHOULDER ARTHROSCOPY WITH ROTATOR CUFF REPAIR AND SUBACROMIAL DECOMPRESSION, release long head biceps tendon;  Surgeon: Leanor Kail, MD;  Location: [facility] ORS;  Service: Orthopedics;  Laterality: Right;  . TRANSURETHRAL RESECTION OF BLADDER TUMOR N/A 09/29/2014   Procedure: TRANSURETHRAL RESECTION OF BLADDER TUMOR (TURBT);  Surgeon: Hollice Espy, MD;  Location: [facility] ORS;  Service: Urology;  Laterality: N/A;  . WRIST FRACTURE SURGERY Left     FAMILY HISTORY Family History  Problem Relation Age of Onset  . Heart attack Father   . Emphysema Father   . Heart disease Mother   . Hypertension Mother   . Diabetes type II Sister   . Kidney disease Neg Hx   . Bladder Cancer Neg Hx   . Breast cancer Neg Hx     GYNECOLOGIC HISTORY:  No LMP recorded. Patient has had a hysterectomy.     ADVANCED DIRECTIVES:    HEALTH MAINTENANCE: Social History   Tobacco Use  . Smoking status: Never Smoker  . Smokeless tobacco: Never Used  Substance Use Topics  . Alcohol use: No  . Drug use: No   No Known Allergies  Current Outpatient Medications  Medication Sig Dispense Refill  . carvedilol (COREG) 12.5 MG tablet Take 12.5 mg by mouth 2 (two) times daily with a meal.    . diltiazem (CARDIZEM CD) 240 MG 24 hr capsule Take 240 mg by mouth daily.    [name] latanoprost (XALATAN) 0.005 %  ophthalmic solution Place 1 drop into both eyes at bedtime.    . pravastatin (PRAVACHOL) 40 MG tablet Take 40 mg by mouth at bedtime.     . raloxifene (EVISTA) 60 MG tablet Take 60 mg by mouth daily.    [name] sulfamethoxazole-trimethoprim (BACTRIM DS,SEPTRA DS) 800-160 MG tablet Take by mouth.     Current Facility-Administered Medications  Medication Dose Route Frequency Provider Last Rate Last Dose  . BCG vaccine (THERACYS) injection 81 mg  81 mg Bladder Instillation Once Roda Shutters, FNP      . sodium chloride flush (NS) 0.9 % injection 50 mL  50 mL Intracatheter Once McGowan, Shannon A, PA-C        OBJECTIVE: BP (!) 151/71 (BP Location: Left Arm)   Pulse 62   Temp (!) 97.2 F (36.2 C) (Tympanic)   Resp 18   Wt 150 lb 12.8 oz (68.4 kg)   BMI 28.49 kg/m    Body mass index is 28.49 kg/m.    ECOG FS:1 - Symptomatic but completely ambulatory  General: Well-developed, well-nourished, no acute distress. Eyes: Pink conjunctiva, anicteric sclera. Lungs: Clear to auscultation bilaterally.  Heart: Regular rate and rhythm. No rubs, murmurs, or gallops. Abdomen: Soft, nontender, nondistended. No organomegaly noted, normoactive bowel sounds. Musculoskeletal: No edema, cyanosis, or clubbing.   Neuro: Alert, answering all questions appropriately. Cranial nerves grossly intact. Skin: No rashes or petechiae noted. Psych: Normal affect.   LAB RESULTS:  CBC    Component Value Date/Time   WBC 5.4 08/14/2017 1334   RBC 3.38 (L) 08/14/2017 1334   HGB 11.6 (L) 08/14/2017 1334   HGB 8.2 (L) 09/05/2015 0900   HCT 34.3 (L) 08/14/2017 1334   HCT 27.6 (L) 09/05/2015 0900   PLT 195 08/14/2017 1334   PLT 389 (H) 09/05/2015 0900   MCV 101.6 (H) 08/14/2017 1334   MCV 83 09/05/2015 0900   MCH 34.5 (H) 08/14/2017 1334   MCHC 33.9 08/14/2017 1334   RDW 12.4 08/14/2017 1334   RDW 17.6 (H) 09/05/2015 0900   LYMPHSABS 1.4 08/14/2017 1334   LYMPHSABS 1.8 09/05/2015 0900   MONOABS 0.5 08/14/2017  1334   EOSABS 0.1 08/14/2017 1334   EOSABS 0.1 09/05/2015 0900   BASOSABS 0.0 08/14/2017 1334   BASOSABS 0.0 09/05/2015 0900   Lab Results  Component Value Date   IRON 76 08/14/2017   TIBC 267 08/14/2017   IRONPCTSAT 29 08/14/2017   Lab Results  Component Value Date   FERRITIN 387 (H) 08/14/2017   Lab Results  Component Value Date   TOTALPROTELP 6.8 08/14/2017   ALBUMINELP 3.4 08/14/2017   A1GS 0.3 08/14/2017   A2GS 0.8 08/14/2017   BETS 1.0 08/14/2017   GAMS 1.3 08/14/2017   MSPIKE 0.9 (H) 08/14/2017   SPEI Comment 08/14/2017     STUDIES: No results found.  ASSESSMENT: Iron deficiency anemia, MGUS.  PLAN:   1. Iron deficiency anemia: Patient's hemoglobin has significantly improved and is now nearly within normal limits.  Her iron stores are also within normal limits.  She does not require additional Feraheme today.  She last received IV Feraheme on May 16, 2017.  Patient has been instructed to continue her oral iron supplementation. It is also possible patient may have a component of MDS. Previously a bone marrow biopsy was discussed, but patient declined. Return to clinic in 4 months with repeat laboratory work and further evaluation. 2. MGUS: Patient is most recent M spike on August 14, 2017 was reported 0.9. This appears to be approximately her baseline ranging from 0.7-1.1.  Her IgG levels are normal, but she does have a suppressed IgA and IgM.  Kappa free light chains are also mildly elevated at 95.4.  Other than her anemia, she has no evidence of endorgan disease.  3. Superficial bladder cancer: Continue follow-up and treatment per urology. 4. Elevated ferritin: Likely secondary to acute phase reaction, monitor.  Patient expressed understanding and was in agreement with this plan. She also understands that She can call clinic at any time with any questions, concerns, or complaints.    Lloyd Huger, MD 08/16/17 3:50 PM

## 2017-08-14 ENCOUNTER — Inpatient Hospital Stay: Payer: Medicare Other | Admitting: Oncology

## 2017-08-14 ENCOUNTER — Other Ambulatory Visit: Payer: Self-pay

## 2017-08-14 ENCOUNTER — Inpatient Hospital Stay: Payer: Medicare Other | Attending: Oncology

## 2017-08-14 ENCOUNTER — Inpatient Hospital Stay: Payer: Medicare Other

## 2017-08-14 VITALS — BP 151/71 | HR 62 | Temp 97.2°F | Resp 18 | Wt 150.8 lb

## 2017-08-14 DIAGNOSIS — M5136 Other intervertebral disc degeneration, lumbar region: Secondary | ICD-10-CM

## 2017-08-14 DIAGNOSIS — G8929 Other chronic pain: Secondary | ICD-10-CM | POA: Insufficient documentation

## 2017-08-14 DIAGNOSIS — Z79899 Other long term (current) drug therapy: Secondary | ICD-10-CM

## 2017-08-14 DIAGNOSIS — I10 Essential (primary) hypertension: Secondary | ICD-10-CM

## 2017-08-14 DIAGNOSIS — M549 Dorsalgia, unspecified: Secondary | ICD-10-CM | POA: Insufficient documentation

## 2017-08-14 DIAGNOSIS — D472 Monoclonal gammopathy: Secondary | ICD-10-CM | POA: Diagnosis not present

## 2017-08-14 DIAGNOSIS — D509 Iron deficiency anemia, unspecified: Secondary | ICD-10-CM | POA: Diagnosis not present

## 2017-08-14 DIAGNOSIS — Z8551 Personal history of malignant neoplasm of bladder: Secondary | ICD-10-CM

## 2017-08-14 DIAGNOSIS — R7989 Other specified abnormal findings of blood chemistry: Secondary | ICD-10-CM | POA: Insufficient documentation

## 2017-08-14 DIAGNOSIS — H409 Unspecified glaucoma: Secondary | ICD-10-CM | POA: Diagnosis not present

## 2017-08-14 DIAGNOSIS — M810 Age-related osteoporosis without current pathological fracture: Secondary | ICD-10-CM

## 2017-08-14 DIAGNOSIS — K219 Gastro-esophageal reflux disease without esophagitis: Secondary | ICD-10-CM | POA: Diagnosis not present

## 2017-08-14 LAB — CBC WITH DIFFERENTIAL/PLATELET
Basophils Absolute: 0 10*3/uL (ref 0–0.1)
Basophils Relative: 1 %
EOS ABS: 0.1 10*3/uL (ref 0–0.7)
Eosinophils Relative: 2 %
HEMATOCRIT: 34.3 % — AB (ref 35.0–47.0)
HEMOGLOBIN: 11.6 g/dL — AB (ref 12.0–16.0)
LYMPHS ABS: 1.4 10*3/uL (ref 1.0–3.6)
Lymphocytes Relative: 25 %
MCH: 34.5 pg — AB (ref 26.0–34.0)
MCHC: 33.9 g/dL (ref 32.0–36.0)
MCV: 101.6 fL — ABNORMAL HIGH (ref 80.0–100.0)
Monocytes Absolute: 0.5 10*3/uL (ref 0.2–0.9)
Monocytes Relative: 10 %
NEUTROS ABS: 3.4 10*3/uL (ref 1.4–6.5)
NEUTROS PCT: 62 %
Platelets: 195 10*3/uL (ref 150–440)
RBC: 3.38 MIL/uL — AB (ref 3.80–5.20)
RDW: 12.4 % (ref 11.5–14.5)
WBC: 5.4 10*3/uL (ref 3.6–11.0)

## 2017-08-14 LAB — BASIC METABOLIC PANEL
ANION GAP: 9 (ref 5–15)
BUN: 22 mg/dL — ABNORMAL HIGH (ref 6–20)
CALCIUM: 9.1 mg/dL (ref 8.9–10.3)
CO2: 24 mmol/L (ref 22–32)
CREATININE: 1.16 mg/dL — AB (ref 0.44–1.00)
Chloride: 106 mmol/L (ref 101–111)
GFR, EST AFRICAN AMERICAN: 49 mL/min — AB (ref >60)
GFR, EST NON AFRICAN AMERICAN: 43 mL/min — AB (ref >60)
Glucose, Bld: 96 mg/dL (ref 65–99)
Potassium: 4.7 mmol/L (ref 3.5–5.1)
Sodium: 139 mmol/L (ref 135–145)

## 2017-08-14 LAB — IRON AND TIBC
Iron: 76 ug/dL (ref 28–170)
SATURATION RATIOS: 29 % (ref 10.4–31.8)
TIBC: 267 ug/dL (ref 250–450)
UIBC: 191 ug/dL

## 2017-08-14 LAB — FERRITIN: FERRITIN: 387 ng/mL — AB (ref 11–307)

## 2017-08-14 NOTE — Progress Notes (Signed)
Here for follow up. Stated that still on abx therapy for back infection per pt

## 2017-08-15 LAB — KAPPA/LAMBDA LIGHT CHAINS
KAPPA FREE LGHT CHN: 95.4 mg/L — AB (ref 3.3–19.4)
KAPPA, LAMDA LIGHT CHAIN RATIO: 16.17 — AB (ref 0.26–1.65)
Lambda free light chains: 5.9 mg/L (ref 5.7–26.3)

## 2017-08-15 LAB — IGG, IGA, IGM
IGM (IMMUNOGLOBULIN M), SRM: 14 mg/dL — AB (ref 26–217)
IgA: 20 mg/dL — ABNORMAL LOW (ref 64–422)
IgG (Immunoglobin G), Serum: 1281 mg/dL (ref 700–1600)

## 2017-08-16 LAB — PROTEIN ELECTROPHORESIS, SERUM
A/G RATIO SPE: 1 (ref 0.7–1.7)
ALBUMIN ELP: 3.4 g/dL (ref 2.9–4.4)
ALPHA-1-GLOBULIN: 0.3 g/dL (ref 0.0–0.4)
ALPHA-2-GLOBULIN: 0.8 g/dL (ref 0.4–1.0)
BETA GLOBULIN: 1 g/dL (ref 0.7–1.3)
Gamma Globulin: 1.3 g/dL (ref 0.4–1.8)
Globulin, Total: 3.4 g/dL (ref 2.2–3.9)
M-Spike, %: 0.9 g/dL — ABNORMAL HIGH
Total Protein ELP: 6.8 g/dL (ref 6.0–8.5)

## 2017-09-05 ENCOUNTER — Encounter: Payer: Self-pay | Admitting: Emergency Medicine

## 2017-09-05 ENCOUNTER — Other Ambulatory Visit: Payer: Self-pay

## 2017-09-05 ENCOUNTER — Emergency Department
Admission: EM | Admit: 2017-09-05 | Discharge: 2017-09-05 | Disposition: A | Discharge status: Home / Self Care | Payer: Medicare Other | Attending: Emergency Medicine | Admitting: Emergency Medicine

## 2017-09-05 ENCOUNTER — Emergency Department: Payer: Medicare Other

## 2017-09-05 DIAGNOSIS — Z8551 Personal history of malignant neoplasm of bladder: Secondary | ICD-10-CM | POA: Diagnosis not present

## 2017-09-05 DIAGNOSIS — M7122 Synovial cyst of popliteal space [Baker], left knee: Secondary | ICD-10-CM | POA: Insufficient documentation

## 2017-09-05 DIAGNOSIS — I10 Essential (primary) hypertension: Secondary | ICD-10-CM | POA: Insufficient documentation

## 2017-09-05 DIAGNOSIS — M79605 Pain in left leg: Secondary | ICD-10-CM

## 2017-09-05 DIAGNOSIS — I2510 Atherosclerotic heart disease of native coronary artery without angina pectoris: Secondary | ICD-10-CM | POA: Diagnosis not present

## 2017-09-05 NOTE — ED Notes (Signed)
To ultrasound by wheel chair with radiology staff.

## 2017-09-05 NOTE — ED Provider Notes (Signed)
[facility] Emergency Department Provider Note       Time seen: ----------------------------------------- 3:01 PM on 09/05/2017 -----------------------------------------   I have reviewed the triage vital signs and the nursing notes.  HISTORY   Chief Complaint Leg Pain    HPI [name] is a 84 y.o. female with a history of anemia, bladder tumor, degenerative disc disease, GERD, hypertension who presents to the ED for left lower extremity pain and swelling that started this morning.  Patient states she has pain with walking and reports she has a history of bad varicose veins.  Family and patient is concerned about a DVT.  Past Medical History:  Diagnosis Date  . Anemia   . Cancer [facility]) 2016   bladder tumor  . Carotid arterial disease ([place]) 01/09/2014   Overview:  Minimal on screening   . Chronic low back pain   . Colon polyp   . DDD (degenerative disc disease), cervical   . DDD (degenerative disc disease), lumbar   . Elevated lipids   . Foot pain   . GERD (gastroesophageal reflux disease)   . Glaucoma   . H/O concussion 04/2012   Due to fall at [facility]  . Heart murmur   . HOH (hard of hearing)    Bilateral  Hearing Aids  . Hypertension   . Multiple gastric ulcers   . Osteoporosis   . Thyroid disease     Patient Active Problem List   Diagnosis Date Noted  . Monoclonal gammopathy of unknown significance (MGUS) 06/11/2016  . Abscess of gluteal region 02/21/2016  . Psoas abscess ([place]) 02/21/2016  . Malignant neoplasm of urinary bladder ([place]) 08/23/2015  . History of repair of rotator cuff 08/23/2015  . Encounter for general adult medical examination without abnormal findings 08/04/2015  . Impingement syndrome of shoulder 03/30/2015  . Impingement syndrome of right shoulder 03/30/2015  . Anemia, iron deficiency 02/02/2015  . IDA (iron deficiency anemia) 01/26/2015  . Malignant neoplasm of bladder neck ([place])  12/30/2014  . Colon polyp 12/17/2014  . Inflammation of sacroiliac joint ([place]) 09/22/2014  . Carotid arterial disease ([place]) 01/09/2014  . Combined fat and carbohydrate induced hyperlipemia 01/09/2014  . Accumulation of fluid in tissues 01/09/2014  . BP (high blood pressure) 01/09/2014  . Arthritis, degenerative 01/09/2014  . OP (osteoporosis) 01/09/2014  . Carotid artery disease ([place]) 01/09/2014  . Edema 01/09/2014  . Cervical osteoarthritis 10/28/2013  . DDD (degenerative disc disease), cervical 10/28/2013  . Acute blood loss anemia 05/16/2012  . Fracture of parietal bone ([place]) 05/16/2012  . Fall down stairs 05/16/2012  . Hemorrhage into subarachnoid space of neuraxis ([place]) 05/16/2012  . Subdural hematoma ([place]) 05/16/2012  . Fracture of temporal bone ([place]) 05/16/2012    Past Surgical History:  Procedure Laterality Date  . ABDOMINAL HYSTERECTOMY  1973  . APPLICATION OF WOUND VAC Right 01/17/2017   Procedure: APPLICATION OF WOUND VAC;  Surgeon: Hessie Knows, MD;  Location: [facility] ORS;  Service: Orthopedics;  Laterality: Right;  . BACK SURGERY     03/1975-ruptured disk, 11/1983-ruptured disk, 11/1984-ruptured disk, 10/1996-ruptured disk, 08/1997-spinal fusion, 02/10/07-spinal fusion [facility]  . BACK SURGERY     Spinal Fusion X 2  . CARPAL TUNNEL RELEASE Right   . CATARACT EXTRACTION Bilateral   . CYSTOSCOPY W/ RETROGRADES Bilateral 09/29/2014   Procedure: CYSTOSCOPY WITH RETROGRADE PYELOGRAM;  Surgeon: Hollice Espy, MD;  Location: [facility] ORS;  Service: Urology;  Laterality: Bilateral;  . CYSTOSCOPY WITH BIOPSY N/A 11/02/2014  Procedure: CYSTOSCOPY WITH BIOPSY/WITH MITOMYCIN;  Surgeon: Hollice Espy, MD;  Location: [facility] ORS;  Service: Urology;  Laterality: N/A;  . ESOPHAGOGASTRODUODENOSCOPY (EGD) WITH PROPOFOL N/A 05/10/2015   Procedure: ESOPHAGOGASTRODUODENOSCOPY (EGD) WITH PROPOFOL;  Surgeon: Lollie Sails, MD;  Location[facility] ENDOSCOPY;  Service: Endoscopy;  Laterality: N/A;  . EYE  SURGERY Bilateral    Cataract Extraction with IOL  . FOOT SURGERY Right   . INCISION AND DRAINAGE ABSCESS Right 11/20/2016   Procedure: INCISION AND DRAINAGE ABSCESS GLUTEAL;  Surgeon: Christene Lye, MD;  Location: [facility] ORS;  Service: General;  Laterality: Right;  . INCISION AND DRAINAGE ABSCESS Right 01/17/2017   Procedure: INCISION AND DRAINAGE GLUTEAL ABSCESS;  Surgeon: Hessie Knows, MD;  Location: [facility] ORS;  Service: Orthopedics;  Laterality: Right;  . IRRIGATION AND DEBRIDEMENT BUTTOCKS Right 10/21/2015   Procedure: DRAINAGE GLUTEAL ABSCESS;  Surgeon: Christene Lye, MD;  Location: [facility] ORS;  Service: General;  Laterality: Right;  . KNEE ARTHROSCOPY Right    x2  . MANDIBLE SURGERY Bilateral    x2  . PICC LINE INSERTION Right 01/08/2017  . SHOULDER ARTHROSCOPY WITH ROTATOR CUFF REPAIR AND SUBACROMIAL DECOMPRESSION Right 05/25/2015   Procedure: SHOULDER ARTHROSCOPY WITH ROTATOR CUFF REPAIR AND SUBACROMIAL DECOMPRESSION, release long head biceps tendon;  Surgeon: Leanor Kail, MD;  Location: [facility] ORS;  Service: Orthopedics;  Laterality: Right;  . TRANSURETHRAL RESECTION OF BLADDER TUMOR N/A 09/29/2014   Procedure: TRANSURETHRAL RESECTION OF BLADDER TUMOR (TURBT);  Surgeon: Hollice Espy, MD;  Location: [facility] ORS;  Service: Urology;  Laterality: N/A;  . WRIST FRACTURE SURGERY Left     Allergies Patient has no known allergies.  Social History Social History   Tobacco Use  . Smoking status: Never Smoker  . Smokeless tobacco: Never Used  Substance Use Topics  . Alcohol use: No  . Drug use: No    Review of Systems Constitutional: Negative for fever. Cardiovascular: Negative for chest pain. Respiratory: Negative for shortness of breath. Gastrointestinal: Negative for abdominal pain, vomiting and diarrhea. Musculoskeletal: Positive for left leg pain Skin: Negative for rash. Neurological: Negative for headaches, focal weakness or numbness.  All systems  negative/normal/unremarkable except as stated in the HPI  ____________________________________________   PHYSICAL EXAM:  VITAL SIGNS: ED Triage Vitals  Enc Vitals Group     BP 09/05/17 1235 (!) 173/59     Pulse Rate 09/05/17 1235 66     Resp 09/05/17 1235 18     Temp 09/05/17 1235 98.4 F (36.9 C)     Temp Source 09/05/17 1235 Oral     SpO2 09/05/17 1235 100 %     Weight 09/05/17 1233 150 lb (68 kg)     Height 09/05/17 1233 5\' 1"  (1.549 m)     Head Circumference --      Peak Flow --      Pain Score 09/05/17 1233 0     Pain Loc --      Pain Edu? --      Excl. in [place]? --    Constitutional: Alert and oriented. Well appearing and in no distress. Eyes: Conjunctivae are normal. Normal extraocular movements. Cardiovascular: Normal rate, regular rhythm. No murmurs, rubs, or gallops.  ABIs are normal on the left arm and left leg. Respiratory: Normal respiratory effort without tachypnea nor retractions. Breath sounds are clear and equal bilaterally. No wheezes/rales/rhonchi. Gastrointestinal: Soft and nontender. Normal bowel sounds Musculoskeletal: Mild pain with range of motion of the left leg and knee.  Tenderness in the left  popliteal fossa Neurologic:  Normal speech and language. No gross focal neurologic deficits are appreciated.  Skin:  Skin is warm, dry and intact. No rash noted. Psychiatric: Mood and affect are normal. Speech and behavior are normal.  ____________________________________________  ED COURSE:  As part of my medical decision making, I reviewed the following data within the [place] History obtained from family if available, nursing notes, old chart and ekg, as well as notes from prior ED visits. Patient presented for left leg pain, we will assess with labs and imaging as indicated at this time.   Procedures ____________________________________________   RADIOLOGY Images were viewed by me  Ultrasound is negative for DVT, Baker's cyst is  noted  ____________________________________________  DIFFERENTIAL DIAGNOSIS   DVT, Baker's cyst, muscle strain, arthritis  FINAL ASSESSMENT AND PLAN  Baker's cyst   Plan: The patient had presented for left leg pain.  Patient's imaging did reveal Baker's cyst.  This could be pain from a Baker's cyst or from arthritis or both.  Otherwise she is cleared for outpatient follow-up.   Laurence Aly, MD   Note: This note was generated in part or whole with voice recognition software. Voice recognition is usually quite accurate but there are transcription errors that can and very often do occur. I apologize for any typographical errors that were not detected and corrected.     Earleen Newport, MD 09/05/17 [phone]

## 2017-09-05 NOTE — ED Triage Notes (Signed)
Pt here for LLE pain and swelling starting this morning.  Pain when walking per pt.  Pt reports hx of bad varicose veins.  Ambulatory to triage. Unlabored. VSS

## 2017-10-15 ENCOUNTER — Ambulatory Visit: Payer: Medicare Other | Admitting: Urology

## 2017-10-15 ENCOUNTER — Encounter: Payer: Self-pay | Admitting: Urology

## 2017-10-15 VITALS — BP 146/76 | HR 83 | Ht 61.0 in | Wt 154.0 lb

## 2017-10-15 DIAGNOSIS — C679 Malignant neoplasm of bladder, unspecified: Secondary | ICD-10-CM | POA: Diagnosis not present

## 2017-10-15 DIAGNOSIS — Z8551 Personal history of malignant neoplasm of bladder: Secondary | ICD-10-CM

## 2017-10-15 LAB — URINALYSIS, COMPLETE
BILIRUBIN UA: NEGATIVE
GLUCOSE, UA: NEGATIVE
KETONES UA: NEGATIVE
NITRITE UA: NEGATIVE
SPEC GRAV UA: 1.02 (ref 1.005–1.030)
UUROB: 0.2 mg/dL (ref 0.2–1.0)
pH, UA: 7.5 (ref 5.0–7.5)

## 2017-10-15 LAB — MICROSCOPIC EXAMINATION

## 2017-10-15 MED ORDER — LIDOCAINE HCL URETHRAL/MUCOSAL 2 % EX GEL
1.0000 "application " | Freq: Once | CUTANEOUS | Status: AC
  Administered 2017-10-15: 1 via URETHRAL

## 2017-10-15 MED ORDER — CIPROFLOXACIN HCL 500 MG PO TABS
500.0000 mg | ORAL_TABLET | Freq: Once | ORAL | Status: AC
  Administered 2017-10-15: 500 mg via ORAL

## 2017-10-15 NOTE — Progress Notes (Signed)
10:09 AM  10/15/17   [name] 07/04/1932 [id number]  Referring provider: Kirk Ruths, MD [facility] [place], [place] [address]  Chief Complaint  Patient presents with  . Cysto    HPI: 84 year old nonsmoker with  incidental 11 mm papillary neoplasm near the RIGHT bladder neck. She was taken to the operating room on 09/29/2014 for TURBT, Bilateral retrograde pyelogramat which time a 2 cm extremely spherical mass was identified just proximal to the RIGHT UO. This was resected along with deeper biopsies of the base. Of note, bilateral retrograde pyelogram was negative for any obvious upper tract filling defects or hydronephrosis.   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 The tumor had an inverted architecture. Deeper biopsies did contain muscle after speaking with the pathologist today and the muscle was negative for any tumor. Additonally, CIS was noted.  She returned to the operating room on 11/02/2014 for restaging TURBT. Pathology showed no evidence of residual tumor.  She completed BCG x 6 for induction and first course of maintenence bcg x 3 doses  X 2.  Her last maintainance dose completed in 07/2015.     She developed recurrent gluteal abscess requiring multiple rounds of IV antibiotics and surgical intervention.  After this, additional BCG was deferred.  She returns today for surveillance cystoscopy.  Her most recent cystoscopy in 03/2017 was negative.  She denies dysuria or gross hematuria.   PMH: Past Medical History:  Diagnosis Date  . Anemia   . Cancer [facility]) 2016   bladder tumor  . Carotid arterial disease ([place]) 01/09/2014   Overview:  Minimal on screening   . Chronic low back pain   . Colon polyp   . DDD (degenerative disc disease), cervical   . DDD (degenerative disc disease), lumbar   . Elevated lipids   . Foot pain   . GERD (gastroesophageal reflux  disease)   . Glaucoma   . H/O concussion 04/2012   Due to fall at [facility]  . Heart murmur   . HOH (hard of hearing)    Bilateral  Hearing Aids  . Hypertension   . Multiple gastric ulcers   . Osteoporosis   . Thyroid disease     Surgical History: Past Surgical History:  Procedure Laterality Date  . ABDOMINAL HYSTERECTOMY  1973  . APPLICATION OF WOUND VAC Right 01/17/2017   Procedure: APPLICATION OF WOUND VAC;  Surgeon: Hessie Knows, MD;  Location: [facility] ORS;  Service: Orthopedics;  Laterality: Right;  . BACK SURGERY     03/1975-ruptured disk, 11/1983-ruptured disk, 11/1984-ruptured disk, 10/1996-ruptured disk, 08/1997-spinal fusion, 02/10/07-spinal fusion [facility]  . BACK SURGERY     Spinal Fusion X 2  . CARPAL TUNNEL RELEASE Right   . CATARACT EXTRACTION Bilateral   . CYSTOSCOPY W/ RETROGRADES Bilateral 09/29/2014   Procedure: CYSTOSCOPY WITH RETROGRADE PYELOGRAM;  Surgeon: [place] Espy, MD;  Location: [facility] ORS;  Service: Urology;  Laterality: Bilateral;  . CYSTOSCOPY WITH BIOPSY N/A 11/02/2014   Procedure: CYSTOSCOPY WITH BIOPSY/WITH MITOMYCIN;  Surgeon: [place] Espy, MD;  Location: [facility] ORS;  Service: Urology;  Laterality: N/A;  . ESOPHAGOGASTRODUODENOSCOPY (EGD) WITH PROPOFOL N/A 05/10/2015   Procedure: ESOPHAGOGASTRODUODENOSCOPY (EGD) WITH PROPOFOL;  Surgeon: Lollie Sails, MD;  Location: [facility] ENDOSCOPY;  Service: Endoscopy;  Laterality: N/A;  . EYE SURGERY Bilateral    Cataract Extraction with IOL  . FOOT SURGERY Right   . INCISION AND DRAINAGE ABSCESS  Right 11/20/2016   Procedure: INCISION AND DRAINAGE ABSCESS GLUTEAL;  Surgeon: Christene Lye, MD;  Location: [facility] ORS;  Service: General;  Laterality: Right;  . INCISION AND DRAINAGE ABSCESS Right 01/17/2017   Procedure: INCISION AND DRAINAGE GLUTEAL ABSCESS;  Surgeon: Hessie Knows, MD;  Location: [facility] ORS;  Service: Orthopedics;  Laterality: Right;  . IRRIGATION AND DEBRIDEMENT BUTTOCKS Right  10/21/2015   Procedure: DRAINAGE GLUTEAL ABSCESS;  Surgeon: Christene Lye, MD;  Location: [facility] ORS;  Service: General;  Laterality: Right;  . KNEE ARTHROSCOPY Right    x2  . MANDIBLE SURGERY Bilateral    x2  . PICC LINE INSERTION Right 01/08/2017  . SHOULDER ARTHROSCOPY WITH ROTATOR CUFF REPAIR AND SUBACROMIAL DECOMPRESSION Right 05/25/2015   Procedure: SHOULDER ARTHROSCOPY WITH ROTATOR CUFF REPAIR AND SUBACROMIAL DECOMPRESSION, release long head biceps tendon;  Surgeon: Leanor Kail, MD;  Location: [facility] ORS;  Service: Orthopedics;  Laterality: Right;  . TRANSURETHRAL RESECTION OF BLADDER TUMOR N/A 09/29/2014   Procedure: TRANSURETHRAL RESECTION OF BLADDER TUMOR (TURBT);  Surgeon: [place] Espy, MD;  Location: [facility] ORS;  Service: Urology;  Laterality: N/A;  . WRIST FRACTURE SURGERY Left     Home Medications:  Allergies as of 10/15/2017   No Known Allergies     Medication List        Accurate as of 10/15/17 10:09 AM. Always use your most recent med list.          carvedilol 12.5 MG tablet Commonly known as:  COREG Take 12.5 mg by mouth 2 (two) times daily with a meal.   diltiazem 240 MG 24 hr capsule Commonly known as:  CARDIZEM CD Take 240 mg by mouth daily.   latanoprost 0.005 % ophthalmic solution Commonly known as:  XALATAN Place 1 drop into both eyes at bedtime.   pravastatin 40 MG tablet Commonly known as:  PRAVACHOL Take 40 mg by mouth at bedtime.   raloxifene 60 MG tablet Commonly known as:  EVISTA Take 60 mg by mouth daily.       Allergies:  No Known Allergies  Family History: Family History  Problem Relation Age of Onset  . Heart attack Father   . Emphysema Father   . Heart disease Mother   . Hypertension Mother   . Diabetes type II Sister   . Kidney disease Neg Hx   . Bladder Cancer Neg Hx   . Breast cancer Neg Hx     Social History:  reports that she has never smoked. She has never used smokeless tobacco. She reports that she does  not drink alcohol or use drugs.   Physical Exam: BP (!) 146/76   Pulse 83   Ht 5\' 1"  (1.549 m)   Wt 154 lb (69.9 kg)   BMI 29.10 kg/m   Constitutional:  Alert and oriented, No acute distress. HEENT: [place] AT, moist mucus membranes.  Trachea midline, no masses. Cardiovascular: No clubbing, cyanosis, or edema. Respiratory: Normal respiratory effort, no increased work of breathing. GI: Abdomen is soft, nontender, nondistended, no abdominal masses  GU:  Normal external genitalia. Normal urethral meatus. Skin: No rashes, bruises or suspicious lesions. Neurologic: Grossly intact, no focal deficits, moving all 4 extremities. Psychiatric: Normal mood and affect.  Laboratory Data: Lab Results  Component Value Date   WBC 5.4 08/14/2017   HGB 11.6 (L) 08/14/2017   HCT 34.3 (L) 08/14/2017   MCV 101.6 (H) 08/14/2017   PLT 195 08/14/2017    Urinalysis UA reviewed, no evidence of infection  Cystoscopy Procedure Note  Patient identification was confirmed, informed consent was obtained, and patient was prepped using Betadine solution.  Lidocaine jelly was administered per urethral meatus.    Preoperative abx where received prior to procedure.    Procedure: - Flexible cystoscope introduced, without any difficulty.   - Thorough search of the bladder revealed:    normal urethral meatus    normal urothelium    no stones    no ulcers     no tumors    no urethral polyps    no trabeculation  - Ureteral orifices were normal in position and appearance.  Post-Procedure: - Patient tolerated the procedure well  Assessment & Plan:  84 year old female with a history of high-grade T1, Tis of the right bladder neck dx 09/2014 followed by induction BCG x 6, and most recently  maintenance BCG completed 04/2015 and 07/2015.  She returns today for surveillance cystoscopy.   1. Malignant neoplasm of bladder neck Cystoscopy today negative  Agreed to continue to abstain from BCG in the absence of  recurrence and above history of recurrent gluteal abscess formation Continue q6 cystoscopy  Return in about 6 months (around 04/17/2018) for cysto.     [place]ice Espy, MD  [facility] [address] [place], [place], [place] [address] [phone]

## 2017-10-30 ENCOUNTER — Other Ambulatory Visit: Payer: Self-pay | Admitting: Unknown Physician Specialty

## 2017-10-30 DIAGNOSIS — R1312 Dysphagia, oropharyngeal phase: Secondary | ICD-10-CM

## 2017-11-21 ENCOUNTER — Ambulatory Visit
Admission: RE | Admit: 2017-11-21 | Discharge: 2017-11-21 | Disposition: A | Discharge status: Home / Self Care | Payer: Medicare Other | Source: Ambulatory Visit | Attending: Unknown Physician Specialty | Admitting: Unknown Physician Specialty

## 2017-11-21 DIAGNOSIS — R1312 Dysphagia, oropharyngeal phase: Secondary | ICD-10-CM

## 2017-11-21 DIAGNOSIS — R1310 Dysphagia, unspecified: Secondary | ICD-10-CM | POA: Insufficient documentation

## 2017-11-21 NOTE — Therapy (Signed)
[place], [place], [address] Phone: [phone]   Fax:     Modified Barium Swallow  Patient Details  Name: [name] MRN: [id number] Date of Birth: June 05, 1932 No data recorded  Encounter Date: 11/21/2017  End of Session - 11/21/17 1342    Visit Number  1    Number of Visits  1    Date for SLP Re-Evaluation  11/21/17    SLP Start Time  68    SLP Stop Time   1330    SLP Time Calculation (min)  60 min    Activity Tolerance  Patient tolerated treatment well       Past Medical History:  Diagnosis Date  . Anemia   . Cancer [facility]) 2016   bladder tumor  . Carotid arterial disease ([place]) 01/09/2014   Overview:  Minimal on screening   . Chronic low back pain   . Colon polyp   . DDD (degenerative disc disease), cervical   . DDD (degenerative disc disease), lumbar   . Elevated lipids   . Foot pain   . GERD (gastroesophageal reflux disease)   . Glaucoma   . H/O concussion 04/2012   Due to fall at [facility]  . Heart murmur   . HOH (hard of hearing)    Bilateral  Hearing Aids  . Hypertension   . Multiple gastric ulcers   . Osteoporosis   . Thyroid disease     Past Surgical History:  Procedure Laterality Date  . ABDOMINAL HYSTERECTOMY  1973  . APPLICATION OF WOUND VAC Right 01/17/2017   Procedure: APPLICATION OF WOUND VAC;  Surgeon: Hessie Knows, MD;  Location: [facility] ORS;  Service: Orthopedics;  Laterality: Right;  . BACK SURGERY     03/1975-ruptured disk, 11/1983-ruptured disk, 11/1984-ruptured disk, 10/1996-ruptured disk, 08/1997-spinal fusion, 02/10/07-spinal fusion [facility]  . BACK SURGERY     Spinal Fusion X 2  . CARPAL TUNNEL RELEASE Right   . CATARACT EXTRACTION Bilateral   . CYSTOSCOPY W/ RETROGRADES Bilateral 09/29/2014   Procedure: CYSTOSCOPY WITH RETROGRADE PYELOGRAM;  Surgeon: Hollice Espy, MD;  Location: [facility] ORS;  Service: Urology;  Laterality:  Bilateral;  . CYSTOSCOPY WITH BIOPSY N/A 11/02/2014   Procedure: CYSTOSCOPY WITH BIOPSY/WITH MITOMYCIN;  Surgeon: Hollice Espy, MD;  Location: [facility] ORS;  Service: Urology;  Laterality: N/A;  . ESOPHAGOGASTRODUODENOSCOPY (EGD) WITH PROPOFOL N/A 05/10/2015   Procedure: ESOPHAGOGASTRODUODENOSCOPY (EGD) WITH PROPOFOL;  Surgeon: Lollie Sails, MD;  Location: [facility] ENDOSCOPY;  Service: Endoscopy;  Laterality: N/A;  . EYE SURGERY Bilateral    Cataract Extraction with IOL  . FOOT SURGERY Right   . INCISION AND DRAINAGE ABSCESS Right 11/20/2016   Procedure: INCISION AND DRAINAGE ABSCESS GLUTEAL;  Surgeon: Christene Lye, MD;  Location: [facility] ORS;  Service: General;  Laterality: Right;  . INCISION AND DRAINAGE ABSCESS Right 01/17/2017   Procedure: INCISION AND DRAINAGE GLUTEAL ABSCESS;  Surgeon: Hessie Knows, MD;  Location: [facility] ORS;  Service: Orthopedics;  Laterality: Right;  . IRRIGATION AND DEBRIDEMENT BUTTOCKS Right 10/21/2015   Procedure: DRAINAGE GLUTEAL ABSCESS;  Surgeon: Christene Lye, MD;  Location: [facility] ORS;  Service: General;  Laterality: Right;  . KNEE ARTHROSCOPY Right    x2  . MANDIBLE SURGERY Bilateral    x2  . PICC LINE INSERTION Right 01/08/2017  . SHOULDER ARTHROSCOPY WITH ROTATOR CUFF REPAIR AND SUBACROMIAL DECOMPRESSION Right 05/25/2015   Procedure: SHOULDER ARTHROSCOPY WITH ROTATOR CUFF REPAIR AND SUBACROMIAL DECOMPRESSION,  release long head biceps tendon;  Surgeon: Leanor Kail, MD;  Location: [facility] ORS;  Service: Orthopedics;  Laterality: Right;  . TRANSURETHRAL RESECTION OF BLADDER TUMOR N/A 09/29/2014   Procedure: TRANSURETHRAL RESECTION OF BLADDER TUMOR (TURBT);  Surgeon: Hollice Espy, MD;  Location: [facility] ORS;  Service: Urology;  Laterality: N/A;  . WRIST FRACTURE SURGERY Left     There were no vitals filed for this visit.      Subjective: Patient behavior: (alertness, ability to follow instructions, etc.): The patient is able to verbalize her  symptoms and follow directions  Chief complaint: choking with solids and strangling with liquids ("not every day")   Objective:  Radiological Procedure: A videoflouroscopic evaluation of oral-preparatory, reflex initiation, and pharyngeal phases of the swallow was performed; as well as a screening of the upper esophageal phase.  I. POSTURE: Upright in MBS chair  II. VIEW: Lateral  III. COMPENSATORY STRATEGIES: N/A  IV. BOLUSES ADMINISTERED:   Thin Liquid: 2 small, 2 rapid consecutive, 2 straw   Nectar-thick Liquid: 1 moderate   Honey-thick Liquid: DNT   Puree: 2 teaspoon presentations   Mechanical Soft: 1/4 graham cracker in applesauce  V. RESULTS OF EVALUATION: A. ORAL PREPARATORY PHASE: (The lips, tongue, and velum are observed for strength and coordination)       **Overall Severity Rating: Within normal limits  B. SWALLOW INITIATION/REFLEX: (The reflex is normal if "triggered" by the time the bolus reached the base of the tongue)  **Overall Severity Rating: Mild; triggers while falling from the valleculae to the pyriform sinuses  C. PHARYNGEAL PHASE: (Pharyngeal function is normal if the bolus shows rapid, smooth, and continuous transit through the pharynx and there is no pharyngeal residue after the swallow)  **Overall Severity Rating: Within normal limits  D. LARYNGEAL PENETRATION: (Material entering into the laryngeal inlet/vestibule but not aspirated) X1- transient   E. ASPIRATION: None  F. ESOPHAGEAL PHASE: (Screening of the upper esophagus): no overt abnormality within the viewable cervical esophagus  ASSESSMENT: This 84 year old woman; with choking spells with liquids and solids; is presenting with minimal oropharyngeal dysphagia characterized by delayed pharyngeal swallow initiation and transient laryngeal penetration X1.  Oral control of the bolus including oral hold, rotary mastication, and anterior to posterior transfer is within normal limits.  Aspects of the  pharyngeal stage of swallowing including tongue base retraction, hyolaryngeal excursion, epiglottic inversion, and duration/amplitude of UES opening are within normal limits.  There is no significant pharyngeal residue and no observed tracheal aspiration.  Laryngeal penetration is within normal limits for age.  The choking on solids was not reproduced during the study, but there is no indication that the patient is at risk for foods entering the airway.  The patient's complaints do not appear to be due to oropharyngeal swallow function and she does not appear to be at significant risk for prandial aspiration.     PLAN/RECOMMENDATIONS:    A. Diet: Regular (soften and moisten as needed)   B. Swallowing Precautions: Drink carefully to avoid strangling, soften / moisten foods as needed   C. Recommended consultation to: GI   D. Therapy recommendations: speech therapy is not indicated   E. Results and recommendations were discussed with the patient immediately following the study and the final report routed to the referring MD.     Oropharyngeal dysphagia - Plan: DG OP Swallowing Func-Medicare/Speech Path, DG OP Swallowing Func-Medicare/Speech Path        Problem List Patient Active Problem List   Diagnosis Date Noted  .  Monoclonal gammopathy of unknown significance (MGUS) 06/11/2016  . Abscess of gluteal region 02/21/2016  . Psoas abscess ([place]) 02/21/2016  . Malignant neoplasm of urinary bladder ([place]) 08/23/2015  . History of repair of rotator cuff 08/23/2015  . Encounter for general adult medical examination without abnormal findings 08/04/2015  . Impingement syndrome of shoulder 03/30/2015  . Impingement syndrome of right shoulder 03/30/2015  . Anemia, iron deficiency 02/02/2015  . IDA (iron deficiency anemia) 01/26/2015  . Malignant neoplasm of bladder neck ([place]) 12/30/2014  . Colon polyp 12/17/2014  . Inflammation of sacroiliac joint ([place]) 09/22/2014  . Carotid arterial disease  ([place]) 01/09/2014  . Combined fat and carbohydrate induced hyperlipemia 01/09/2014  . Accumulation of fluid in tissues 01/09/2014  . BP (high blood pressure) 01/09/2014  . Arthritis, degenerative 01/09/2014  . OP (osteoporosis) 01/09/2014  . Carotid artery disease ([place]) 01/09/2014  . Edema 01/09/2014  . Cervical osteoarthritis 10/28/2013  . DDD (degenerative disc disease), cervical 10/28/2013  . Acute blood loss anemia 05/16/2012  . Fracture of parietal bone ([place]) 05/16/2012  . Fall down stairs 05/16/2012  . Hemorrhage into subarachnoid space of neuraxis ([place]) 05/16/2012  . Subdural hematoma ([place]) 05/16/2012  . Fracture of temporal bone ([place]) 05/16/2012   Leroy Sea, MS/CCC- SLP  Lou Miner 11/21/2017, 1:43 PM  [facility] [place], [place], [address] Phone: [phone]   Fax:     Name: [name] MRN: [id number] Date of Birth: [birth year]

## 2017-12-16 NOTE — Progress Notes (Signed)
[facility]  [phone]  [phone]     [name] DOB: [birth year]  MR#: [id number]  LTJ#:[id number]  Patient Care Team: Kirk Ruths, MD as PCP - General (Internal Medicine) Hollice Espy, MD as Consulting Physician (Urology) Kirk Ruths, MD (Internal Medicine) Christene Lye, MD (General Surgery)  CHIEF COMPLAINT:  Iron deficiency anemia, unspecified. MGUS.  INTERVAL HISTORY: Patient returns to clinic today for repeat laboratory work and further evaluation.  She has chronic knee pain and plans to have knee replacement surgery in the next several months.  Her chronic infection in her back has now resolved.  She otherwise feels well and is asymptomatic.  She has no neurologic complaints. She denies any recent fevers or illnesses. She has no chest pain or shortness of breath.  She denies any nausea, vomiting, constipation, or diarrhea. She has noted no melena or hematochezia. She has no urinary complaints.  Patient offers no further specific complaints today.  REVIEW OF SYSTEMS:   Review of Systems  Constitutional: Negative.  Negative for fever, malaise/fatigue and weight loss.  HENT: Negative.  Negative for congestion and sinus pain.   Respiratory: Negative.  Negative for cough and shortness of breath.   Cardiovascular: Negative.  Negative for chest pain and leg swelling.  Gastrointestinal: Negative.  Negative for abdominal pain, blood in stool and melena.  Genitourinary: Negative.  Negative for dysuria.  Musculoskeletal: Positive for joint pain. Negative for back pain.  Skin: Negative.  Negative for rash.  Neurological: Negative.  Negative for dizziness, tingling, weakness and headaches.  Psychiatric/Behavioral: Negative.  The patient is not nervous/anxious and does not have insomnia.     As per HPI. Otherwise, a complete review of systems is negative.   PAST MEDICAL HISTORY: Past Medical History:  Diagnosis  Date  . Anemia   . Cancer [facility]) 2016   bladder tumor  . Carotid arterial disease ([place]) 01/09/2014   Overview:  Minimal on screening   . Chronic low back pain   . Colon polyp   . DDD (degenerative disc disease), cervical   . DDD (degenerative disc disease), lumbar   . Elevated lipids   . Foot pain   . GERD (gastroesophageal reflux disease)   . Glaucoma   . H/O concussion 04/2012   Due to fall at [facility]  . Heart murmur   . HOH (hard of hearing)    Bilateral  Hearing Aids  . Hypertension   . Multiple gastric ulcers   . Osteoporosis   . Thyroid disease     PAST SURGICAL HISTORY: Past Surgical History:  Procedure Laterality Date  . ABDOMINAL HYSTERECTOMY  1973  . APPLICATION OF WOUND VAC Right 01/17/2017   Procedure: APPLICATION OF WOUND VAC;  Surgeon: Hessie Knows, MD;  Location: [facility] ORS;  Service: Orthopedics;  Laterality: Right;  . BACK SURGERY     03/1975-ruptured disk, 11/1983-ruptured disk, 11/1984-ruptured disk, 10/1996-ruptured disk, 08/1997-spinal fusion, 02/10/07-spinal fusion [facility]  . BACK SURGERY     Spinal Fusion X 2  . CARPAL TUNNEL RELEASE Right   . CATARACT EXTRACTION Bilateral   . CYSTOSCOPY W/ RETROGRADES Bilateral 09/29/2014   Procedure: CYSTOSCOPY WITH RETROGRADE PYELOGRAM;  Surgeon: Hollice Espy, MD;  Location: [facility] ORS;  Service: Urology;  Laterality: Bilateral;  . CYSTOSCOPY WITH BIOPSY N/A 11/02/2014   Procedure: CYSTOSCOPY WITH BIOPSY/WITH MITOMYCIN;  Surgeon: Hollice Espy, MD;  Location: [facility] ORS;  Service: Urology;  Laterality: N/A;  . ESOPHAGOGASTRODUODENOSCOPY (EGD) WITH PROPOFOL N/A  05/10/2015   Procedure: ESOPHAGOGASTRODUODENOSCOPY (EGD) WITH PROPOFOL;  Surgeon: Lollie Sails, MD;  Location: [facility] ENDOSCOPY;  Service: Endoscopy;  Laterality: N/A;  . EYE SURGERY Bilateral    Cataract Extraction with IOL  . FOOT SURGERY Right   . INCISION AND DRAINAGE ABSCESS Right 11/20/2016   Procedure: INCISION AND DRAINAGE ABSCESS  GLUTEAL;  Surgeon: Christene Lye, MD;  Location: [facility] ORS;  Service: General;  Laterality: Right;  . INCISION AND DRAINAGE ABSCESS Right 01/17/2017   Procedure: INCISION AND DRAINAGE GLUTEAL ABSCESS;  Surgeon: Hessie Knows, MD;  Location: [facility] ORS;  Service: Orthopedics;  Laterality: Right;  . IRRIGATION AND DEBRIDEMENT BUTTOCKS Right 10/21/2015   Procedure: DRAINAGE GLUTEAL ABSCESS;  Surgeon: Christene Lye, MD;  Location: [facility] ORS;  Service: General;  Laterality: Right;  . KNEE ARTHROSCOPY Right    x2  . MANDIBLE SURGERY Bilateral    x2  . PICC LINE INSERTION Right 01/08/2017  . SHOULDER ARTHROSCOPY WITH ROTATOR CUFF REPAIR AND SUBACROMIAL DECOMPRESSION Right 05/25/2015   Procedure: SHOULDER ARTHROSCOPY WITH ROTATOR CUFF REPAIR AND SUBACROMIAL DECOMPRESSION, release long head biceps tendon;  Surgeon: Leanor Kail, MD;  Location: [facility] ORS;  Service: Orthopedics;  Laterality: Right;  . TRANSURETHRAL RESECTION OF BLADDER TUMOR N/A 09/29/2014   Procedure: TRANSURETHRAL RESECTION OF BLADDER TUMOR (TURBT);  Surgeon: Hollice Espy, MD;  Location: [facility] ORS;  Service: Urology;  Laterality: N/A;  . WRIST FRACTURE SURGERY Left     FAMILY HISTORY Family History  Problem Relation Age of Onset  . Heart attack Father   . Emphysema Father   . Heart disease Mother   . Hypertension Mother   . Diabetes type II Sister   . Kidney disease Neg Hx   . Bladder Cancer Neg Hx   . Breast cancer Neg Hx     GYNECOLOGIC HISTORY:  No LMP recorded. Patient has had a hysterectomy.     ADVANCED DIRECTIVES:    HEALTH MAINTENANCE: Social History   Tobacco Use  . Smoking status: Never Smoker  . Smokeless tobacco: Never Used  Substance Use Topics  . Alcohol use: No  . Drug use: No   No Known Allergies  Current Outpatient Medications  Medication Sig Dispense Refill  . carvedilol (COREG) 12.5 MG tablet Take 12.5 mg by mouth 2 (two) times daily with a meal.    . diltiazem (CARDIZEM CD)  240 MG 24 hr capsule Take 240 mg by mouth daily.    [name] latanoprost (XALATAN) 0.005 % ophthalmic solution Place 1 drop into both eyes at bedtime.    . pravastatin (PRAVACHOL) 40 MG tablet Take 40 mg by mouth at bedtime.     . raloxifene (EVISTA) 60 MG tablet Take 60 mg by mouth daily.    . calcium-vitamin D (OSCAL WITH D) 500-200 MG-UNIT tablet Take 1 tablet by mouth daily.    . vitamin B-12 (CYANOCOBALAMIN) 1000 MCG tablet Take 2,000 mcg by mouth daily.     No current facility-administered medications for this visit.     OBJECTIVE: BP 138/63 (BP Location: Right Arm, Patient Position: Sitting)   Pulse (!) 57   Temp (!) 97.3 F (36.3 C) (Tympanic)   Resp 18   Wt 155 lb 6.4 oz (70.5 kg)   BMI 29.36 kg/m    Body mass index is 29.36 kg/m.    ECOG FS:1 - Symptomatic but completely ambulatory  General: Well-developed, well-nourished, no acute distress. Eyes: Pink conjunctiva, anicteric sclera. HEENT: Normocephalic, moist mucous membranes. Lungs: Clear to auscultation bilaterally.  Heart: Regular rate and rhythm. No rubs, murmurs, or gallops. Abdomen: Soft, nontender, nondistended. No organomegaly noted, normoactive bowel sounds. Musculoskeletal: No edema, cyanosis, or clubbing. Neuro: Alert, answering all questions appropriately. Cranial nerves grossly intact. Skin: No rashes or petechiae noted. Psych: Normal affect.   LAB RESULTS:  CBC    Component Value Date/Time   WBC 5.2 12/18/2017 1412   RBC 3.54 (L) 12/18/2017 1412   HGB 11.8 (L) 12/18/2017 1412   HGB 8.2 (L) 09/05/2015 0900   HCT 34.8 (L) 12/18/2017 1412   HCT 27.6 (L) 09/05/2015 0900   PLT 199 12/18/2017 1412   PLT 389 (H) 09/05/2015 0900   MCV 98.3 12/18/2017 1412   MCV 83 09/05/2015 0900   MCH 33.4 12/18/2017 1412   MCHC 34.0 12/18/2017 1412   RDW 12.9 12/18/2017 1412   RDW 17.6 (H) 09/05/2015 0900   LYMPHSABS 1.5 12/18/2017 1412   LYMPHSABS 1.8 09/05/2015 0900   MONOABS 0.7 12/18/2017 1412   EOSABS 0.1  12/18/2017 1412   EOSABS 0.1 09/05/2015 0900   BASOSABS 0.0 12/18/2017 1412   BASOSABS 0.0 09/05/2015 0900   Lab Results  Component Value Date   IRON 74 12/18/2017   TIBC 271 12/18/2017   IRONPCTSAT 27 12/18/2017   Lab Results  Component Value Date   FERRITIN 324 (H) 12/18/2017   Lab Results  Component Value Date   TOTALPROTELP 6.8 08/14/2017   ALBUMINELP 3.4 08/14/2017   A1GS 0.3 08/14/2017   A2GS 0.8 08/14/2017   BETS 1.0 08/14/2017   GAMS 1.3 08/14/2017   MSPIKE 0.9 (H) 08/14/2017   SPEI Comment 08/14/2017     STUDIES: Dg Esophagus  Result Date: 12/19/2017 CLINICAL DATA:  Difficulty swallowing liquids solids and pills for the past 5 months. The patient gets choked and has strangling sensation. History of previous esophagram in 2016. In the past esophageal dilation has been recommended. EXAM: ESOPHOGRAM / BARIUM SWALLOW / BARIUM TABLET STUDY TECHNIQUE: Combined double contrast and single contrast examination performed using effervescent crystals, thick barium liquid, and thin barium liquid. The patient was observed with fluoroscopy swallowing a 13 mm barium sulphate tablet. FLUOROSCOPY TIME:  Fluoroscopy Time:  2 minutes, 12 seconds Radiation Exposure Index (if provided by the fluoroscopic device): 36.1 mGy Number of Acquired Spot Images: 11+ 1 video loop. COMPARISON:  Modified barium swallow of November 21, 2017 and standard barium esophagram of March 17, 2015 FINDINGS: The hypopharynx distended well. There was a tiny amount of laryngeal penetration of the barium which occurred intermittently but cleared with coughing. This did elicit a cough reflex. The proximal cervical esophagus distended well as did the thoracic esophagus down to a moderate-sized reducible hiatal hernia. Moderate narrowing at the GE junction was observed which was obstructive to passage of the barium tablet. The tablet was allowed to dissolve. There was no obstruction to passage of liquid barium. Mild tertiary  contractions were observed intermittently. IMPRESSION: Minimal laryngeal penetration of the barium occurs occasionally and elicits a cough reflex which clears the barium. Moderate-sized reducible hiatal hernia. Moderate narrowing at the GE junction which is obstructive to passage of the barium tablet. No ulceration or esophageal mass. No reflux was observed. Direct visualization of the esophagus is recommended with consideration for dilation. Electronically Signed   By: David  Martinique M.D.   On: 12/19/2017 10:53    ASSESSMENT: Iron deficiency anemia, MGUS.  PLAN:   1. Iron deficiency anemia: Patient's hemoglobin is 11.8 and essentially within normal limits.  Her iron stores also continues  to be within normal limits. She does not require additional Feraheme today.  She last received IV Feraheme on May 16, 2017.  Patient has been instructed to continue her oral iron supplementation.  No intervention is needed at this time.  Return to clinic in 4 months with repeat laboratory work and further evaluation. 2. MGUS: Patient is most recent M spike on August 14, 2017 was reported 0.9. This appears to be approximately her baseline ranging from 0.7-1.1.  Her IgG levels are normal, but she does have a suppressed IgA and IgM.  Kappa free light chains are also mildly elevated at 95.4.  Other than her anemia, she has no evidence of endorgan disease.  Repeat laboratory work at next clinic visit. 3. Superficial bladder cancer: Continue follow-up and treatment per urology. 4.  Elevated ferritin: Likely secondary to acute phase reactant.  Monitor.  Patient expressed understanding and was in agreement with this plan. She also understands that She can call clinic at any time with any questions, concerns, or complaints.    Lloyd Huger, MD 12/20/17 3:06 PM

## 2017-12-17 ENCOUNTER — Encounter: Payer: Self-pay | Admitting: Internal Medicine

## 2017-12-17 ENCOUNTER — Ambulatory Visit: Payer: Medicare Other | Admitting: Internal Medicine

## 2017-12-17 ENCOUNTER — Other Ambulatory Visit: Payer: Self-pay | Admitting: Gastroenterology

## 2017-12-17 VITALS — BP 185/72 | HR 57 | Temp 98.3°F | Ht 61.0 in | Wt 155.0 lb

## 2017-12-17 DIAGNOSIS — Z8551 Personal history of malignant neoplasm of bladder: Secondary | ICD-10-CM | POA: Diagnosis not present

## 2017-12-17 DIAGNOSIS — R1310 Dysphagia, unspecified: Secondary | ICD-10-CM

## 2017-12-17 DIAGNOSIS — K6812 Psoas muscle abscess: Secondary | ICD-10-CM | POA: Diagnosis not present

## 2017-12-17 NOTE — Progress Notes (Signed)
Patient ID: [name], female   DOB: 04-15-1933, 84 y.o.   MRN: [id number]  HPI [name] is an 84yo F that has previously seen dr fitzgerald for management of  Hx of psoas abscess, requiring repeat debridement due reaccumulation. cx were negative . Interestingly had BCG treatment for bladder cancer but stopped -- since having psoas m abscess.. She reports that she no longer has issues with the affected area . She is now scheduled to have further orthopedic surgery with  Dr Marry Guan - to do repair   left knee baker's cyst and left knee replacement. Se was referred to see if she is cleared for surgery. She states that she has been off of abtx since June. Her inflammatory markers had been trending down. No fevers, chills, nightsweats. Only arthritis to both knees.   Outpatient Encounter Medications as of 12/17/2017  Medication Sig  . calcium carbonate (OS-CAL - DOSED IN MG OF ELEMENTAL CALCIUM) 1250 (500 Ca) MG tablet Take 1 tablet by mouth.  . carvedilol (COREG) 12.5 MG tablet Take 12.5 mg by mouth 2 (two) times daily with a meal.  . Cyanocobalamin (B-12) 2000 MCG TABS Take 1 tablet by mouth daily.  [name] diltiazem (CARDIZEM CD) 240 MG 24 hr capsule Take 240 mg by mouth daily.  [name] latanoprost (XALATAN) 0.005 % ophthalmic solution Place 1 drop into both eyes at bedtime.  . pravastatin (PRAVACHOL) 40 MG tablet Take 40 mg by mouth at bedtime.   . raloxifene (EVISTA) 60 MG tablet Take 60 mg by mouth daily.  . [DISCONTINUED] sodium chloride flush (NS) 0.9 % injection 50 mL    No facility-administered encounter medications on file as of 12/17/2017.      Patient Active Problem List   Diagnosis Date Noted  . Monoclonal gammopathy of unknown significance (MGUS) 06/11/2016  . Abscess of gluteal region 02/21/2016  . Psoas abscess ([place]) 02/21/2016  . Malignant neoplasm of urinary bladder ([place]) 08/23/2015  . History of repair of rotator cuff 08/23/2015  . Encounter for general adult  medical examination without abnormal findings 08/04/2015  . Impingement syndrome of shoulder 03/30/2015  . Impingement syndrome of right shoulder 03/30/2015  . Anemia, iron deficiency 02/02/2015  . IDA (iron deficiency anemia) 01/26/2015  . Malignant neoplasm of bladder neck ([place]) 12/30/2014  . Colon polyp 12/17/2014  . Inflammation of sacroiliac joint ([place]) 09/22/2014  . Carotid arterial disease ([place]) 01/09/2014  . Combined fat and carbohydrate induced hyperlipemia 01/09/2014  . Accumulation of fluid in tissues 01/09/2014  . BP (high blood pressure) 01/09/2014  . Arthritis, degenerative 01/09/2014  . OP (osteoporosis) 01/09/2014  . Carotid artery disease ([place]) 01/09/2014  . Edema 01/09/2014  . Cervical osteoarthritis 10/28/2013  . DDD (degenerative disc disease), cervical 10/28/2013  . Acute blood loss anemia 05/16/2012  . Fracture of parietal bone ([place]) 05/16/2012  . Fall down stairs 05/16/2012  . Hemorrhage into subarachnoid space of neuraxis ([place]) 05/16/2012  . Subdural hematoma ([place]) 05/16/2012  . Fracture of temporal bone ([place]) 05/16/2012     Health Maintenance Due  Topic Date Due  . TETANUS/TDAP  11/08/1953  . DEXA SCAN  11/09/1999  . PNA vac Low Risk Adult (1 of 2 - PCV13) 11/09/1999    Breast Cancer-relatedfamily history is negative for Breast cancer. Social History   Tobacco Use  . Smoking status: Never Smoker  . Smokeless tobacco: Never Used  Substance Use Topics  . Alcohol use: No  . Drug use: No   Review of  Systems +left knee pain. 12 point ros Pnysical Exam   Ht 5\' 1"  (1.549 m)   Wt 155 lb (70.3 kg)   BMI 29.29 kg/m   Physical Exam  Constitutional:  oriented to person, place, and time. appears well-developed and well-nourished. No distress.  HENT: [place]/AT, PERRLA, no scleral icterus Mouth/Throat: Oropharynx is clear and moist. No oropharyngeal exudate.  Cardiovascular: Normal rate, regular rhythm and normal heart sounds. Exam reveals no gallop and no  friction rub.  No murmur heard.  Pulmonary/Chest: Effort normal and breath sounds normal. No respiratory distress.  has no wheezes.  Neck = supple, no nuchal rigidity Neurological: alert and oriented to person, place, and time.  Skin: Skin is warm and dry. No rash noted. No erythema.  Psychiatric: a normal mood and affect.  behavior is normal.   CBC Lab Results  Component Value Date   WBC 5.4 08/14/2017   RBC 3.38 (L) 08/14/2017   HGB 11.6 (L) 08/14/2017   HCT 34.3 (L) 08/14/2017   PLT 195 08/14/2017   MCV 101.6 (H) 08/14/2017   MCH 34.5 (H) 08/14/2017   MCHC 33.9 08/14/2017   RDW 12.4 08/14/2017   LYMPHSABS 1.4 08/14/2017   MONOABS 0.5 08/14/2017   EOSABS 0.1 08/14/2017    BMET Lab Results  Component Value Date   NA 139 08/14/2017   K 4.7 08/14/2017   CL 106 08/14/2017   CO2 24 08/14/2017   GLUCOSE 96 08/14/2017   BUN 22 (H) 08/14/2017   CREATININE 1.16 (H) 08/14/2017   CALCIUM 9.1 08/14/2017   GFRNONAA 43 (L) 08/14/2017   GFRAA 49 (L) 08/14/2017   Lab Results  Component Value Date   ESRSEDRATE 14 12/17/2017   Lab Results  Component Value Date   CRP 5.2 12/17/2017      Assessment and Plan 84yo F with culture negative psoas abscess after receiving BCG treatment. I wonder if her BCG treatments would cause sterile abscess -- appears to have resolved with broad spectrum abtx.   Check sed rate and crp --  Addendum - they are WNL and improved since previously measured in June. She is cleared for surgery from infectious diseases standpoint.  Recommend that she gets screened for mrsa if she needs to be decolonized

## 2017-12-18 ENCOUNTER — Inpatient Hospital Stay: Payer: Medicare Other

## 2017-12-18 ENCOUNTER — Other Ambulatory Visit: Payer: Self-pay

## 2017-12-18 ENCOUNTER — Inpatient Hospital Stay: Payer: Medicare Other | Attending: Oncology

## 2017-12-18 ENCOUNTER — Inpatient Hospital Stay: Payer: Medicare Other | Admitting: Oncology

## 2017-12-18 VITALS — BP 138/63 | HR 57 | Temp 97.3°F | Resp 18 | Wt 155.4 lb

## 2017-12-18 DIAGNOSIS — I10 Essential (primary) hypertension: Secondary | ICD-10-CM

## 2017-12-18 DIAGNOSIS — Z8551 Personal history of malignant neoplasm of bladder: Secondary | ICD-10-CM

## 2017-12-18 DIAGNOSIS — E079 Disorder of thyroid, unspecified: Secondary | ICD-10-CM

## 2017-12-18 DIAGNOSIS — M25569 Pain in unspecified knee: Secondary | ICD-10-CM | POA: Diagnosis not present

## 2017-12-18 DIAGNOSIS — K219 Gastro-esophageal reflux disease without esophagitis: Secondary | ICD-10-CM

## 2017-12-18 DIAGNOSIS — M5136 Other intervertebral disc degeneration, lumbar region: Secondary | ICD-10-CM | POA: Insufficient documentation

## 2017-12-18 DIAGNOSIS — H409 Unspecified glaucoma: Secondary | ICD-10-CM | POA: Diagnosis not present

## 2017-12-18 DIAGNOSIS — D509 Iron deficiency anemia, unspecified: Secondary | ICD-10-CM | POA: Diagnosis not present

## 2017-12-18 DIAGNOSIS — Z79899 Other long term (current) drug therapy: Secondary | ICD-10-CM

## 2017-12-18 DIAGNOSIS — D472 Monoclonal gammopathy: Secondary | ICD-10-CM | POA: Diagnosis not present

## 2017-12-18 DIAGNOSIS — R7989 Other specified abnormal findings of blood chemistry: Secondary | ICD-10-CM | POA: Insufficient documentation

## 2017-12-18 DIAGNOSIS — G8929 Other chronic pain: Secondary | ICD-10-CM | POA: Insufficient documentation

## 2017-12-18 LAB — CBC WITH DIFFERENTIAL/PLATELET
BASOS ABS: 28 {cells}/uL (ref 0–200)
Basophils Absolute: 0 10*3/uL (ref 0–0.1)
Basophils Relative: 0.5 %
Basophils Relative: 1 %
EOS ABS: 0.1 10*3/uL (ref 0–0.7)
EOS PCT: 2 %
EOS PCT: 2 %
Eosinophils Absolute: 110 cells/uL (ref 15–500)
HCT: 35.4 % (ref 35.0–45.0)
HCT: 34.8 % — ABNORMAL LOW (ref 35.0–47.0)
HEMOGLOBIN: 12.1 g/dL (ref 11.7–15.5)
HEMOGLOBIN: 11.8 g/dL — AB (ref 12.0–16.0)
Lymphocytes Relative: 29 %
Lymphs Abs: 1777 cells/uL (ref 850–3900)
Lymphs Abs: 1.5 10*3/uL (ref 1.0–3.6)
MCH: 32.7 pg (ref 27.0–33.0)
MCH: 33.4 pg (ref 26.0–34.0)
MCHC: 34.2 g/dL (ref 32.0–36.0)
MCHC: 34 g/dL (ref 32.0–36.0)
MCV: 95.7 fL (ref 80.0–100.0)
MCV: 98.3 fL (ref 80.0–100.0)
MONOS PCT: 13.3 %
MONOS PCT: 14 %
MPV: 11.1 fL (ref 7.5–12.5)
Monocytes Absolute: 0.7 10*3/uL (ref 0.2–0.9)
NEUTROS ABS: 2855 {cells}/uL (ref 1500–7800)
Neutro Abs: 2.8 10*3/uL (ref 1.4–6.5)
Neutrophils Relative %: 51.9 %
Neutrophils Relative %: 54 %
PLATELETS: 207 10*3/uL (ref 140–400)
Platelets: 199 10*3/uL (ref 150–440)
RBC: 3.7 10*6/uL — ABNORMAL LOW (ref 3.80–5.10)
RBC: 3.54 MIL/uL — ABNORMAL LOW (ref 3.80–5.20)
RDW: 11.6 % (ref 11.0–15.0)
RDW: 12.9 % (ref 11.5–14.5)
TOTAL LYMPHOCYTE: 32.3 %
WBC mixed population: 732 cells/uL (ref 200–950)
WBC: 5.5 10*3/uL (ref 3.8–10.8)
WBC: 5.2 10*3/uL (ref 3.6–11.0)

## 2017-12-18 LAB — IRON AND TIBC
Iron: 74 ug/dL (ref 28–170)
SATURATION RATIOS: 27 % (ref 10.4–31.8)
TIBC: 271 ug/dL (ref 250–450)
UIBC: 197 ug/dL

## 2017-12-18 LAB — SEDIMENTATION RATE: SED RATE: 14 mm/h (ref 0–30)

## 2017-12-18 LAB — C-REACTIVE PROTEIN: CRP: 5.2 mg/L (ref <8.0)

## 2017-12-18 LAB — FERRITIN: Ferritin: 324 ng/mL — ABNORMAL HIGH (ref 11–307)

## 2017-12-18 NOTE — Progress Notes (Signed)
Here for  follow up. Left ankle swollen 1 pls pitting edema-having 1 of 2 knee replacements next month-per pt has bakers cyst behind left knee. Followed by Dr Marry Guan. Per pt energy level better ( stated she had infection in back and on abx for 2 yrs she stated )

## 2017-12-19 ENCOUNTER — Ambulatory Visit
Admission: RE | Admit: 2017-12-19 | Discharge: 2017-12-19 | Disposition: A | Discharge status: Home / Self Care | Payer: Medicare Other | Source: Ambulatory Visit | Attending: Gastroenterology | Admitting: Gastroenterology

## 2017-12-19 DIAGNOSIS — R1310 Dysphagia, unspecified: Secondary | ICD-10-CM | POA: Insufficient documentation

## 2017-12-19 DIAGNOSIS — K449 Diaphragmatic hernia without obstruction or gangrene: Secondary | ICD-10-CM | POA: Insufficient documentation

## 2017-12-19 DIAGNOSIS — K222 Esophageal obstruction: Secondary | ICD-10-CM | POA: Diagnosis not present

## 2017-12-19 DIAGNOSIS — R05 Cough: Secondary | ICD-10-CM | POA: Insufficient documentation

## 2017-12-25 ENCOUNTER — Encounter
Admission: RE | Admit: 2017-12-25 | Discharge: 2017-12-25 | Disposition: A | Discharge status: Home / Self Care | Payer: Medicare Other | Source: Ambulatory Visit | Attending: Orthopedic Surgery | Admitting: Orthopedic Surgery

## 2017-12-25 ENCOUNTER — Other Ambulatory Visit: Payer: Self-pay

## 2017-12-25 DIAGNOSIS — I10 Essential (primary) hypertension: Secondary | ICD-10-CM | POA: Insufficient documentation

## 2017-12-25 DIAGNOSIS — Z01818 Encounter for other preprocedural examination: Secondary | ICD-10-CM | POA: Insufficient documentation

## 2017-12-25 DIAGNOSIS — Z0183 Encounter for blood typing: Secondary | ICD-10-CM | POA: Insufficient documentation

## 2017-12-25 DIAGNOSIS — Z01812 Encounter for preprocedural laboratory examination: Secondary | ICD-10-CM | POA: Insufficient documentation

## 2017-12-25 HISTORY — DX: Localized edema: R60.0

## 2017-12-25 LAB — URINALYSIS, ROUTINE W REFLEX MICROSCOPIC
Bilirubin Urine: NEGATIVE
Glucose, UA: NEGATIVE mg/dL
Ketones, ur: NEGATIVE mg/dL
Leukocytes, UA: NEGATIVE
Nitrite: NEGATIVE
PROTEIN: NEGATIVE mg/dL
Specific Gravity, Urine: 1.011 (ref 1.005–1.030)
pH: 6 (ref 5.0–8.0)

## 2017-12-25 LAB — COMPREHENSIVE METABOLIC PANEL
ALK PHOS: 82 U/L (ref 38–126)
ALT: 34 U/L (ref 0–44)
ANION GAP: 6 (ref 5–15)
AST: 41 U/L (ref 15–41)
Albumin: 3.8 g/dL (ref 3.5–5.0)
BUN: 23 mg/dL (ref 8–23)
CALCIUM: 9.4 mg/dL (ref 8.9–10.3)
CO2: 27 mmol/L (ref 22–32)
Chloride: 107 mmol/L (ref 98–111)
Creatinine, Ser: 0.98 mg/dL (ref 0.44–1.00)
GFR, EST NON AFRICAN AMERICAN: 52 mL/min — AB (ref >60)
Glucose, Bld: 180 mg/dL — ABNORMAL HIGH (ref 70–99)
Potassium: 3.5 mmol/L (ref 3.5–5.1)
SODIUM: 140 mmol/L (ref 135–145)
TOTAL PROTEIN: 7.4 g/dL (ref 6.5–8.1)
Total Bilirubin: 0.8 mg/dL (ref 0.3–1.2)

## 2017-12-25 LAB — TYPE AND SCREEN
ABO/RH(D): A POS
Antibody Screen: NEGATIVE

## 2017-12-25 LAB — PROTIME-INR
INR: 1.09
PROTHROMBIN TIME: 14 s (ref 11.4–15.2)

## 2017-12-25 LAB — SURGICAL PCR SCREEN
MRSA, PCR: NEGATIVE
STAPHYLOCOCCUS AUREUS: NEGATIVE

## 2017-12-25 LAB — APTT: aPTT: 32 seconds (ref 24–36)

## 2017-12-25 NOTE — Patient Instructions (Signed)
Your procedure is scheduled on: 01/10/18 Fri Report to Same Day Surgery 2nd floor medical mall [facility] Entrance-take elevator on left to 2nd floor.  Check in with surgery information desk.) To find out your arrival time please call [phone] between 1PM - 3PM on 01/09/18 Thurs  Remember: Instructions that are not followed completely may result in serious medical risk, up to and including death, or upon the discretion of your surgeon and anesthesiologist your surgery may need to be rescheduled.    _x___ 1. Do not eat food after midnight the night before your procedure. You may drink clear liquids up to 2 hours before you are scheduled to arrive at the hospital for your procedure.  Do not drink clear liquids within 2 hours of your scheduled arrival to the hospital.  Clear liquids include  --Water or Apple juice without pulp  --Clear carbohydrate beverage such as ClearFast or Gatorade  --Black Coffee or Clear Tea (No milk, no creamers, do not add anything to                  the coffee or Tea Type 1 and type 2 diabetics should only drink water.  No gum chewing or hard candies.     __x__ 2. No Alcohol for 24 hours before or after surgery.   __x__3. No Smoking or e-cigarettes for 24 prior to surgery.  Do not use any chewable tobacco products for at least 6 hour prior to surgery   ____  4. Bring all medications with you on the day of surgery if instructed.    __x__ 5. Notify your doctor if there is any change in your medical condition     (cold, fever, infections).    x___6. On the morning of surgery brush your teeth with toothpaste and water.  You may rinse your mouth with mouth wash if you wish.  Do not swallow any toothpaste or mouthwash.   Do not wear jewelry, make-up, hairpins, clips or nail polish.  Do not wear lotions, powders, or perfumes. You may wear deodorant.  Do not shave 48 hours prior to surgery. Men may shave face and neck.  Do not bring valuables to the hospital.     [facility] is not responsible for any belongings or valuables.               Contacts, dentures or bridgework may not be worn into surgery.  Leave your suitcase in the car. After surgery it may be brought to your room.  For patients admitted to the hospital, discharge time is determined by your                       treatment team.  _  Patients discharged the day of surgery will not be allowed to drive home.  You will need someone to drive you home and stay with you the night of your procedure.    Please read over the following fact sheets that you were given:   [facility] Preparing for Surgery and or MRSA Information   _x___ Take anti-hypertensive listed below, cardiac, seizure, asthma,     anti-reflux and psychiatric medicines. These include:  1. carvedilol (COREG) 12.5 MG tablet  2.diltiazem (CARDIZEM CD) 240 MG 24 hr capsule  3.  4.  5.  6.  ____Fleets enema or Magnesium Citrate as directed.   _x___ Use CHG Soap or sage wipes as directed on instruction sheet   ____ Use inhalers on the  day of surgery and bring to hospital day of surgery  ____ Stop Metformin and Janumet 2 days prior to surgery.    ____ Take 1/2 of usual insulin dose the night before surgery and none on the morning     surgery.   _x___ Follow recommendations from Cardiologist, Pulmonologist or PCP regarding          stopping Aspirin, Coumadin, Plavix ,Eliquis, Effient, or Pradaxa, and Pletal.  X____Stop Anti-inflammatories such as Advil, Aleve, Ibuprofen, Motrin, Naproxen, Naprosyn, Goodies powders or aspirin products. OK to take Tylenol and                          Celebrex.   _x___ Stop supplements until after surgery.  But may continue Vitamin D, Vitamin B,       and multivitamin.   ____ Bring C-Pap to the hospital.

## 2017-12-26 ENCOUNTER — Ambulatory Visit: Payer: Medicare Other | Admitting: Gastroenterology

## 2017-12-26 LAB — C-REACTIVE PROTEIN

## 2017-12-26 LAB — URINE CULTURE: SPECIAL REQUESTS: NORMAL

## 2017-12-26 NOTE — Pre-Procedure Instructions (Signed)
UCx results faxed to Dr. Clydell Hakim office.

## 2018-01-07 ENCOUNTER — Encounter: Payer: Self-pay | Admitting: *Deleted

## 2018-01-08 ENCOUNTER — Encounter
Admission: RE | Disposition: A | Discharge status: Home / Self Care | Payer: Self-pay | Source: Ambulatory Visit | Attending: Internal Medicine

## 2018-01-08 ENCOUNTER — Encounter: Payer: Self-pay | Admitting: Anesthesiology

## 2018-01-08 ENCOUNTER — Ambulatory Visit
Admission: RE | Admit: 2018-01-08 | Discharge: 2018-01-08 | Disposition: A | Discharge status: Home / Self Care | Payer: Medicare Other | Source: Ambulatory Visit | Attending: Internal Medicine | Admitting: Internal Medicine

## 2018-01-08 ENCOUNTER — Ambulatory Visit: Payer: Medicare Other | Admitting: Registered Nurse

## 2018-01-08 DIAGNOSIS — M810 Age-related osteoporosis without current pathological fracture: Secondary | ICD-10-CM | POA: Insufficient documentation

## 2018-01-08 DIAGNOSIS — Z79899 Other long term (current) drug therapy: Secondary | ICD-10-CM

## 2018-01-08 DIAGNOSIS — Z8711 Personal history of peptic ulcer disease: Secondary | ICD-10-CM

## 2018-01-08 DIAGNOSIS — I10 Essential (primary) hypertension: Secondary | ICD-10-CM | POA: Insufficient documentation

## 2018-01-08 DIAGNOSIS — K449 Diaphragmatic hernia without obstruction or gangrene: Secondary | ICD-10-CM

## 2018-01-08 DIAGNOSIS — I739 Peripheral vascular disease, unspecified: Secondary | ICD-10-CM

## 2018-01-08 DIAGNOSIS — R1310 Dysphagia, unspecified: Secondary | ICD-10-CM

## 2018-01-08 DIAGNOSIS — H409 Unspecified glaucoma: Secondary | ICD-10-CM | POA: Insufficient documentation

## 2018-01-08 DIAGNOSIS — Z8551 Personal history of malignant neoplasm of bladder: Secondary | ICD-10-CM | POA: Insufficient documentation

## 2018-01-08 DIAGNOSIS — K222 Esophageal obstruction: Secondary | ICD-10-CM

## 2018-01-08 HISTORY — DX: Low back pain, unspecified: M54.50

## 2018-01-08 HISTORY — DX: Ulcer of esophagus without bleeding: K22.10

## 2018-01-08 HISTORY — DX: Nontoxic multinodular goiter: E04.2

## 2018-01-08 HISTORY — DX: Malignant neoplasm of bladder, unspecified: C67.9

## 2018-01-08 HISTORY — DX: Personal history of other medical treatment: Z92.89

## 2018-01-08 HISTORY — PX: ESOPHAGOGASTRODUODENOSCOPY (EGD) WITH PROPOFOL: SHX5813

## 2018-01-08 HISTORY — DX: Osteomyelitis of vertebra, site unspecified: M46.20

## 2018-01-08 HISTORY — DX: Low back pain: M54.5

## 2018-01-08 HISTORY — DX: Personal history of other infectious and parasitic diseases: Z86.19

## 2018-01-08 SURGERY — ESOPHAGOGASTRODUODENOSCOPY (EGD) WITH PROPOFOL
Anesthesia: General

## 2018-01-08 MED ORDER — SODIUM CHLORIDE 0.9 % IV SOLN
INTRAVENOUS | Status: DC
  Administered 2018-01-08: 1000 mL via INTRAVENOUS

## 2018-01-08 MED ORDER — PROPOFOL 500 MG/50ML IV EMUL
INTRAVENOUS | Status: DC | PRN
  Administered 2018-01-08: 140 ug/kg/min via INTRAVENOUS

## 2018-01-08 MED ORDER — PROPOFOL 10 MG/ML IV BOLUS
INTRAVENOUS | Status: DC | PRN
  Administered 2018-01-08: 60 mg via INTRAVENOUS

## 2018-01-08 MED ORDER — GLYCOPYRROLATE 0.2 MG/ML IJ SOLN
INTRAMUSCULAR | Status: DC | PRN
  Administered 2018-01-08: 0.2 mg via INTRAVENOUS

## 2018-01-08 MED ORDER — LIDOCAINE HCL (CARDIAC) PF 100 MG/5ML IV SOSY
PREFILLED_SYRINGE | INTRAVENOUS | Status: DC | PRN
  Administered 2018-01-08: 40 mg via INTRAVENOUS

## 2018-01-08 NOTE — Op Note (Signed)
[facility] Gastroenterology Patient Name: [name] Procedure Date: 01/08/2018 10:02 AM MRN: [id number] Account #: [id number] Date of Birth: 1933-02-15 Admit Type: Outpatient Age: 84 Room: [facility] ENDO ROOM 4 Gender: Female Note Status: Finalized Procedure:            Upper GI endoscopy Indications:          Dysphagia Providers:            [name]. [place] MD, MD Medicines:            Propofol per Anesthesia Complications:        No immediate complications. Procedure:            Pre-Anesthesia Assessment:                       - The risks and benefits of the procedure and the                        sedation options and risks were discussed with the                        patient. All questions were answered and informed                        consent was obtained.                       - Patient identification and proposed procedure were                        verified prior to the procedure by the nurse. The                        procedure was verified in the procedure room.                       - ASA Grade Assessment: II - A patient with mild                        systemic disease.                       - After reviewing the risks and benefits, the patient                        was deemed in satisfactory condition to undergo the                        procedure.                       After obtaining informed consent, the endoscope was                        passed under direct vision. Throughout the procedure,                        the patient's blood pressure, pulse, and oxygen                        saturations were monitored continuously. The Endoscope  was introduced through the mouth, and advanced to the                        third part of duodenum. The upper GI endoscopy was                        accomplished without difficulty. The patient tolerated                        the procedure well. Findings:  Non-occlusive esophageal "B" ring      The scope was withdrawn. Dilation was performed in the distal esophagus       with a Maloney dilator with no resistance at 57 Fr.      A 1 cm hiatal hernia was present.      The examined duodenum was normal. Impression:           - 1 cm hiatal hernia.                       - Normal examined duodenum.                       - Dilation performed in the distal esophagus.                       - No specimens collected. Recommendation:       - Patient has a contact number available for                        emergencies. The signs and symptoms of potential                        delayed complications were discussed with the patient.                        Return to normal activities tomorrow. Written discharge                        instructions were provided to the patient.                       - Resume previous diet.                       - Continue present medications.                       - No repeat upper endoscopy.                       - Return to GI office PRN.                       - The findings and recommendations were discussed with                        the patient and their family. Procedure Code(s):    --- Professional ---                       [phone], Esophagogastroduodenoscopy, flexible, transoral;  diagnostic, including collection of specimen(s) by                        brushing or washing, when performed (separate procedure)                       43450, Dilation of esophagus, by unguided sound or                        bougie, single or multiple passes Diagnosis Code(s):    --- Professional ---                       R13.10, Dysphagia, unspecified                       K44.9, Diaphragmatic hernia without obstruction or                        gangrene CPT copyright 2017 American Medical Association. All rights reserved. The codes documented in this report are preliminary and upon coder review may  be revised to  meet current compliance requirements. Efrain Sella MD, MD 01/08/2018 10:31:56 AM This report has been signed electronically. Number of Addenda: 0 Note Initiated On: 01/08/2018 10:02 AM      [facility]

## 2018-01-08 NOTE — H&P (Signed)
Outpatient short stay form Pre-procedure 01/08/2018 10:06 AM [name] K. Alice Reichert, M.D.  Primary Physician: [name], M.D.  Reason for visit: Dysphagia  History of present illness: 84 year old female presents with intermittent solid food as well as liquid dysphagia.  She required the Heimlich maneuver be performed earlier in the summer after getting choked on a piece of meat.  Last month barium modified barium swallow was performed which was unremarkable.  Patient still has some pill dysphagia.    Current Facility-Administered Medications:  .  0.9 %  sodium chloride infusion, , Intravenous, Continuous, [name], [name], MD  Medications Prior to Admission  Medication Sig Dispense Refill Last Dose  . carvedilol (COREG) 12.5 MG tablet Take 12.5 mg by mouth 2 (two) times daily with a meal.   01/08/2018 at Unknown time  . Cholecalciferol 2000 units TABS Take 2,000 Units by mouth daily.     . calcium-vitamin D (OSCAL WITH D) 500-200 MG-UNIT tablet Take 1 tablet by mouth daily.     [name] diltiazem (CARDIZEM CD) 240 MG 24 hr capsule Take 240 mg by mouth daily.   Taking  . latanoprost (XALATAN) 0.005 % ophthalmic solution Place 1 drop into both eyes at bedtime.   Taking  . pravastatin (PRAVACHOL) 40 MG tablet Take 40 mg by mouth at bedtime.    Taking  . raloxifene (EVISTA) 60 MG tablet Take 60 mg by mouth daily.   Taking  . vitamin B-12 (CYANOCOBALAMIN) 1000 MCG tablet Take 2,000 mcg by mouth daily.        No Known Allergies   Past Medical History:  Diagnosis Date  . Anemia   . Anemia   . Bladder cancer ([place])   . Cancer [facility]) 2016   bladder tumor  . Carotid arterial disease ([place]) 01/09/2014   Overview:  Minimal on screening   . Chronic low back pain   . Colon polyp   . DDD (degenerative disc disease), cervical   . DDD (degenerative disc disease), lumbar   . Elevated lipids   . Erosive esophagitis   . Foot pain   . GERD (gastroesophageal reflux disease)   . Glaucoma   . H/O  concussion 04/2012   Due to fall at [facility]  . Heart murmur   . History of bone density study   . History of chickenpox   . HOH (hard of hearing)    Bilateral  Hearing Aids  . Hypertension   . Lower back pain   . Lower extremity edema   . Multinodular thyroid   . Multiple gastric ulcers   . Osteoporosis   . 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[place])   . Thyroid disease     Review of systems:  Otherwise negative.    Physical Exam  Gen: Alert, oriented. Appears stated age.  HEENT: [place]/AT. PERRLA. Lungs: CTA, no wheezes. CV: RR nl S1, S2. Abd: soft, benign, no masses. BS+ Ext: No edema. Pulses 2+    Planned procedures: Proceed with EGD with possible dilatation/possible biopsy. The patient understands the nature of the planned procedure, indications, risks, alternatives and potential complications including but not limited to bleeding, infection, perforation, damage to internal organs and possible oversedation/side effects from anesthesia. The patient agrees and gives consent to proceed.  Please refer to procedure notes for findings, recommendations and patient disposition/instructions.     [name] K. Alice Reichert, M.D. Gastroenterology 01/08/2018  10:06 AM

## 2018-01-08 NOTE — Interval H&P Note (Signed)
History and Physical Interval Note:  01/08/2018 10:08 AM  [name]  has presented today for surgery, with the diagnosis of DYSPHAGIA  The various methods of treatment have been discussed with the patient and family. After consideration of risks, benefits and other options for treatment, the patient has consented to  Procedure(s): ESOPHAGOGASTRODUODENOSCOPY (EGD) WITH PROPOFOL (N/A) as a surgical intervention .  The patient's history has been reviewed, patient examined, no change in status, stable for surgery.  I have reviewed the patient's chart and labs.  Questions were answered to the patient's satisfaction.     [place], [place]

## 2018-01-08 NOTE — Anesthesia Postprocedure Evaluation (Signed)
Anesthesia Post Note  Patient: [name]  Procedure(s) Performed: ESOPHAGOGASTRODUODENOSCOPY (EGD) WITH PROPOFOL (N/A )  Patient location during evaluation: Endoscopy Anesthesia Type: General Level of consciousness: awake and alert Pain management: pain level controlled Vital Signs Assessment: post-procedure vital signs reviewed and stable Respiratory status: spontaneous breathing, nonlabored ventilation, respiratory function stable and patient connected to nasal cannula oxygen Cardiovascular status: blood pressure returned to baseline and stable Postop Assessment: no apparent nausea or vomiting Anesthetic complications: no     Last Vitals:  Vitals:   01/08/18 0947 01/08/18 1033  BP: (!) 170/71 (!) 120/54  Pulse: 62 82  Resp: 18 19  Temp: 36.5 C (!) 36.1 C  SpO2: 99% 97%    Last Pain:  Vitals:   01/08/18 1033  TempSrc: Tympanic  PainSc:                  Martha Clan

## 2018-01-08 NOTE — Transfer of Care (Signed)
Immediate Anesthesia Transfer of Care Note  Patient: [name]  Procedure(s) Performed: ESOPHAGOGASTRODUODENOSCOPY (EGD) WITH PROPOFOL (N/A )  Patient Location: PACU  Anesthesia Type:General  Level of Consciousness: sedated  Airway & Oxygen Therapy: Patient Spontanous Breathing and Patient connected to nasal cannula oxygen  Post-op Assessment: Report given to RN and Post -op Vital signs reviewed and stable  Post vital signs: Reviewed and stable  Last Vitals:  Vitals Value Taken Time  BP 120/54 01/08/2018 10:33 AM  Temp 36.1 C 01/08/2018 10:33 AM  Pulse 83 01/08/2018 10:33 AM  Resp 19 01/08/2018 10:33 AM  SpO2 98 % 01/08/2018 10:33 AM  Vitals shown include unvalidated device data.  Last Pain:  Vitals:   01/08/18 1033  TempSrc: Tympanic  PainSc:          Complications: No apparent anesthesia complications

## 2018-01-08 NOTE — Anesthesia Post-op Follow-up Note (Signed)
Anesthesia QCDR form completed.        

## 2018-01-08 NOTE — Anesthesia Preprocedure Evaluation (Signed)
Anesthesia Evaluation  Patient identified by MRN, date of birth, ID band Patient awake    Reviewed: Allergy & Precautions, H&P , NPO status , Patient's Chart, lab work & pertinent test results  History of Anesthesia Complications Negative for: history of anesthetic complications  Airway Mallampati: III  TM Distance: <3 FB Neck ROM: limited    Dental  (+) Poor Dentition, Dental Advidsory Given   Pulmonary neg pulmonary ROS, neg shortness of breath,           Cardiovascular Exercise Tolerance: Good hypertension, (-) angina+ Peripheral Vascular Disease  (-) Past MI and (-) DOE + Valvular Problems/Murmurs      Neuro/Psych neg Seizures  Neuromuscular disease negative psych ROS   GI/Hepatic Neg liver ROS, PUD, GERD  ,  Endo/Other  negative endocrine ROS  Renal/GU negative Renal ROS  negative genitourinary   Musculoskeletal  (+) Arthritis ,   Abdominal   Peds  Hematology negative hematology ROS (+)   Anesthesia Other Findings Past Medical History:   Hypertension                                                 Anemia                                                       DDD (degenerative disc disease), cervical                    DDD (degenerative disc disease), lumbar                      Elevated lipids                                              Osteoporosis                                                 Glaucoma                                                     Multiple gastric ulcers                                      Foot pain                                                    Colon polyp  Chronic low back pain                                        Carotid arterial disease ([place])                  01/09/2014      Comment:Overview:  Minimal on screening    Cancer ([place])                                    2016           Comment:bladder tumor  Past Surgical  History:   BACK SURGERY                                                    Comment:x 6   CARPAL TUNNEL RELEASE                           Right              WRIST FRACTURE SURGERY                          Left              KNEE ARTHROSCOPY                                Right                Comment:x2   MANDIBLE SURGERY                                Bilateral                Comment:x2   CATARACT EXTRACTION                             Bilateral              TRANSURETHRAL RESECTION OF BLADDER TUMOR        N/A 09/29/2014       Comment:Procedure: TRANSURETHRAL RESECTION OF BLADDER               TUMOR (TURBT);  Surgeon: Hollice Espy, MD;                Location: [facility] ORS;  Service: Urology;                Laterality: N/A;   CYSTOSCOPY W/ RETROGRADES                       Bilateral 09/29/2014       Comment:Procedure: CYSTOSCOPY WITH RETROGRADE               PYELOGRAM;  Surgeon: Hollice Espy, MD;                Location: [facility] OR[facility];  Service: Urology;  Laterality: Bilateral;   EYE SURGERY                                                   CYSTOSCOPY WITH BIOPSY                          N/A 11/02/2014       Comment:Procedure: CYSTOSCOPY WITH BIOPSY/WITH               MITOMYCIN;  Surgeon: Hollice Espy, MD;                Location: [facility] OR[facility];  Service: Urology;                Laterality: N/A;   FOOT SURGERY                                    Right              ESOPHAGOGASTRODUODENOSCOPY (EGD) WITH PROPOFOL  N/A 05/10/2015     Comment:Procedure: ESOPHAGOGASTRODUODENOSCOPY (EGD)               WITH PROPOFOL;  Surgeon: Lollie Sails, MD;              Location: [facility]COPY;  Service: Endoscopy;               Laterality: N/A;   SHOULDER ARTHROSCOPY WITH ROTATOR CUFF REPAIR * Right 05/25/2015     Comment:Procedure: SHOULDER ARTHROSCOPY WITH ROTATOR               CUFF REPAIR AND SUBACROMIAL DECOMPRESSION,               release long head biceps tendon;  Surgeon:               Leanor Kail, MD;  Location: [facility] OR[facility];                Service: Orthopedics;  Laterality: Right;   ABDOMINAL HYSTERECTOMY                           1973        BMI    Body Mass Index   27.03 kg/m 2      Reproductive/Obstetrics negative OB ROS                             Anesthesia Physical  Anesthesia Plan  ASA: III  Anesthesia Plan: General   Post-op Pain Management:    Induction: Intravenous  PONV Risk Score and Plan: 2 and Propofol infusion and TIVA  Airway Management Planned: Nasal Cannula and Natural Airway  Additional Equipment:   Intra-op Plan:   Post-operative Plan:   Informed Consent: I have reviewed the patients History and Physical, chart, labs and discussed the procedure including the risks, benefits and alternatives for the proposed anesthesia with the patient or authorized representative who has indicated his/her understanding and acceptance.   Dental Advisory Given  Plan Discussed with: Anesthesiologist, CRNA and Surgeon  Anesthesia Plan Comments: (Patient consented for risks of anesthesia including but not limited to:  - adverse reactions to medications - damage to teeth,  lips or other oral mucosa - sore throat or hoarseness - Damage to heart, brain, lungs or loss of life  Patient voiced understanding.)        Anesthesia Quick Evaluation

## 2018-01-09 MED ORDER — CEFAZOLIN SODIUM-DEXTROSE 2-4 GM/100ML-% IV SOLN: 2.0000 g | INTRAVENOUS | Status: DC

## 2018-01-09 MED ORDER — TRANEXAMIC ACID 1000 MG/10ML IV SOLN
1000.0000 mg | INTRAVENOUS | Status: DC
  Filled 2018-01-09: qty 10

## 2018-01-10 ENCOUNTER — Inpatient Hospital Stay: Payer: Medicare Other

## 2018-01-10 ENCOUNTER — Inpatient Hospital Stay: Payer: Medicare Other | Admitting: Certified Registered Nurse Anesthetist

## 2018-01-10 ENCOUNTER — Inpatient Hospital Stay
Admission: RE | Admit: 2018-01-10 | Discharge: 2018-01-12 | DRG: 470 | Disposition: A | Discharge status: Home Health | Payer: Medicare Other | Attending: Orthopedic Surgery | Admitting: Orthopedic Surgery

## 2018-01-10 ENCOUNTER — Other Ambulatory Visit: Payer: Self-pay

## 2018-01-10 ENCOUNTER — Encounter
Admission: RE | Disposition: A | Discharge status: Home Health | Payer: Self-pay | Source: Ambulatory Visit | Attending: Orthopedic Surgery

## 2018-01-10 ENCOUNTER — Encounter: Payer: Self-pay | Admitting: Orthopedic Surgery

## 2018-01-10 DIAGNOSIS — Z96659 Presence of unspecified artificial knee joint: Secondary | ICD-10-CM

## 2018-01-10 DIAGNOSIS — M810 Age-related osteoporosis without current pathological fracture: Secondary | ICD-10-CM | POA: Diagnosis present

## 2018-01-10 DIAGNOSIS — H409 Unspecified glaucoma: Secondary | ICD-10-CM | POA: Diagnosis present

## 2018-01-10 DIAGNOSIS — H9190 Unspecified hearing loss, unspecified ear: Secondary | ICD-10-CM | POA: Diagnosis present

## 2018-01-10 DIAGNOSIS — K219 Gastro-esophageal reflux disease without esophagitis: Secondary | ICD-10-CM | POA: Diagnosis present

## 2018-01-10 DIAGNOSIS — Z8551 Personal history of malignant neoplasm of bladder: Secondary | ICD-10-CM | POA: Diagnosis not present

## 2018-01-10 DIAGNOSIS — I10 Essential (primary) hypertension: Secondary | ICD-10-CM | POA: Diagnosis present

## 2018-01-10 DIAGNOSIS — Z96652 Presence of left artificial knee joint: Secondary | ICD-10-CM

## 2018-01-10 DIAGNOSIS — M1712 Unilateral primary osteoarthritis, left knee: Secondary | ICD-10-CM | POA: Diagnosis present

## 2018-01-10 HISTORY — PX: KNEE ARTHROPLASTY: SHX992

## 2018-01-10 LAB — TYPE AND SCREEN
ABO/RH(D): A POS
Antibody Screen: NEGATIVE

## 2018-01-10 LAB — SEDIMENTATION RATE: SED RATE: 33 mm/h — AB (ref 0–30)

## 2018-01-10 SURGERY — ARTHROPLASTY, KNEE, TOTAL, USING IMAGELESS COMPUTER-ASSISTED NAVIGATION
Anesthesia: Spinal | Site: Knee | Laterality: Left | Wound class: Clean

## 2018-01-10 MED ORDER — SODIUM CHLORIDE 0.9 % IJ SOLN
INTRAMUSCULAR | Status: AC
  Filled 2018-01-10: qty 50

## 2018-01-10 MED ORDER — PROPOFOL 500 MG/50ML IV EMUL
INTRAVENOUS | Status: AC
  Filled 2018-01-10: qty 100

## 2018-01-10 MED ORDER — CEFAZOLIN SODIUM-DEXTROSE 2-4 GM/100ML-% IV SOLN
2.0000 g | INTRAVENOUS | Status: AC
  Administered 2018-01-10: 2 g via INTRAVENOUS

## 2018-01-10 MED ORDER — ONDANSETRON HCL 4 MG/2ML IJ SOLN: 4.0000 mg | Freq: Four times a day (QID) | INTRAMUSCULAR | Status: DC | PRN

## 2018-01-10 MED ORDER — MAGNESIUM HYDROXIDE 400 MG/5ML PO SUSP
30.0000 mL | Freq: Every day | ORAL | Status: DC
  Administered 2018-01-11 – 2018-01-12 (×2): 30 mL via ORAL
  Filled 2018-01-10 (×2): qty 30

## 2018-01-10 MED ORDER — BUPIVACAINE HCL (PF) 0.25 % IJ SOLN
INTRAMUSCULAR | Status: DC | PRN
  Administered 2018-01-10: 60 mL

## 2018-01-10 MED ORDER — ALUM & MAG HYDROXIDE-SIMETH 200-200-20 MG/5ML PO SUSP
30.0000 mL | ORAL | Status: DC | PRN
Start: 2018-01-10 — End: 2018-01-12

## 2018-01-10 MED ORDER — SUGAMMADEX SODIUM 200 MG/2ML IV SOLN
INTRAVENOUS | Status: DC | PRN
  Administered 2018-01-10: 150 mg via INTRAVENOUS

## 2018-01-10 MED ORDER — CALCIUM CARBONATE-VITAMIN D 500-200 MG-UNIT PO TABS
1.0000 | ORAL_TABLET | Freq: Every day | ORAL | Status: DC
  Administered 2018-01-11 – 2018-01-12 (×2): 1 via ORAL
  Filled 2018-01-10 (×2): qty 1

## 2018-01-10 MED ORDER — DIPHENHYDRAMINE HCL 12.5 MG/5ML PO ELIX: 12.5000 mg | ORAL_SOLUTION | ORAL | Status: DC | PRN

## 2018-01-10 MED ORDER — DILTIAZEM HCL ER COATED BEADS 240 MG PO CP24
240.0000 mg | ORAL_CAPSULE | Freq: Every day | ORAL | Status: DC
  Administered 2018-01-11 – 2018-01-12 (×2): 240 mg via ORAL
  Filled 2018-01-10 (×2): qty 1

## 2018-01-10 MED ORDER — CELECOXIB 200 MG PO CAPS
ORAL_CAPSULE | ORAL | Status: AC
  Administered 2018-01-10: 400 mg via ORAL
  Filled 2018-01-10: qty 2

## 2018-01-10 MED ORDER — BUPIVACAINE HCL (PF) 0.5 % IJ SOLN
INTRAMUSCULAR | Status: AC
  Filled 2018-01-10: qty 10

## 2018-01-10 MED ORDER — DEXAMETHASONE SODIUM PHOSPHATE 10 MG/ML IJ SOLN
8.0000 mg | Freq: Once | INTRAMUSCULAR | Status: AC
  Administered 2018-01-10: 8 mg via INTRAVENOUS

## 2018-01-10 MED ORDER — MEPERIDINE HCL 50 MG/ML IJ SOLN: 6.2500 mg | INTRAMUSCULAR | Status: DC | PRN

## 2018-01-10 MED ORDER — ENOXAPARIN SODIUM 30 MG/0.3ML ~~LOC~~ SOLN
30.0000 mg | Freq: Two times a day (BID) | SUBCUTANEOUS | Status: DC
  Administered 2018-01-11 – 2018-01-12 (×3): 30 mg via SUBCUTANEOUS
  Filled 2018-01-10 (×3): qty 0.3

## 2018-01-10 MED ORDER — SODIUM CHLORIDE FLUSH 0.9 % IV SOLN
INTRAVENOUS | Status: AC
  Filled 2018-01-10: qty 40

## 2018-01-10 MED ORDER — OXYCODONE HCL 5 MG PO TABS
5.0000 mg | ORAL_TABLET | ORAL | Status: DC | PRN
  Administered 2018-01-12: 5 mg via ORAL
  Filled 2018-01-10: qty 1

## 2018-01-10 MED ORDER — PHENOL 1.4 % MT LIQD
1.0000 | OROMUCOSAL | Status: DC | PRN
  Filled 2018-01-10: qty 177

## 2018-01-10 MED ORDER — MENTHOL 3 MG MT LOZG
1.0000 | LOZENGE | OROMUCOSAL | Status: DC | PRN
  Filled 2018-01-10: qty 9

## 2018-01-10 MED ORDER — CELECOXIB 200 MG PO CAPS
200.0000 mg | ORAL_CAPSULE | Freq: Two times a day (BID) | ORAL | Status: DC
  Administered 2018-01-10 – 2018-01-12 (×4): 200 mg via ORAL
  Filled 2018-01-10 (×4): qty 1

## 2018-01-10 MED ORDER — EPHEDRINE SULFATE 50 MG/ML IJ SOLN
INTRAMUSCULAR | Status: DC | PRN
  Administered 2018-01-10: 5 mg via INTRAVENOUS

## 2018-01-10 MED ORDER — SODIUM CHLORIDE 0.9 % IV SOLN
1000.0000 mg | INTRAVENOUS | Status: AC
  Administered 2018-01-10: 1000 mg via INTRAVENOUS
  Filled 2018-01-10: qty 1100

## 2018-01-10 MED ORDER — FERROUS SULFATE 325 (65 FE) MG PO TABS
325.0000 mg | ORAL_TABLET | Freq: Two times a day (BID) | ORAL | Status: DC
  Administered 2018-01-11 – 2018-01-12 (×3): 325 mg via ORAL
  Filled 2018-01-10 (×3): qty 1

## 2018-01-10 MED ORDER — METOCLOPRAMIDE HCL 5 MG/ML IJ SOLN: 5.0000 mg | Freq: Three times a day (TID) | INTRAMUSCULAR | Status: DC | PRN

## 2018-01-10 MED ORDER — PROPOFOL 500 MG/50ML IV EMUL
INTRAVENOUS | Status: DC | PRN
  Administered 2018-01-10: 50 ug/kg/min via INTRAVENOUS

## 2018-01-10 MED ORDER — BISACODYL 10 MG RE SUPP
10.0000 mg | Freq: Every day | RECTAL | Status: DC | PRN
  Administered 2018-01-12: 10 mg via RECTAL
  Filled 2018-01-10: qty 1

## 2018-01-10 MED ORDER — CELECOXIB 200 MG PO CAPS
400.0000 mg | ORAL_CAPSULE | Freq: Once | ORAL | Status: AC
  Administered 2018-01-10: 400 mg via ORAL

## 2018-01-10 MED ORDER — ACETAMINOPHEN 325 MG PO TABS: 325.0000 mg | ORAL_TABLET | Freq: Four times a day (QID) | ORAL | Status: DC | PRN

## 2018-01-10 MED ORDER — ACETAMINOPHEN 10 MG/ML IV SOLN
INTRAVENOUS | Status: DC | PRN
  Administered 2018-01-10: 1000 mg via INTRAVENOUS

## 2018-01-10 MED ORDER — CEFAZOLIN SODIUM-DEXTROSE 2-4 GM/100ML-% IV SOLN
INTRAVENOUS | Status: AC
  Filled 2018-01-10: qty 100

## 2018-01-10 MED ORDER — ONDANSETRON HCL 4 MG PO TABS: 4.0000 mg | ORAL_TABLET | Freq: Four times a day (QID) | ORAL | Status: DC | PRN

## 2018-01-10 MED ORDER — ACETAMINOPHEN 10 MG/ML IV SOLN
1000.0000 mg | Freq: Four times a day (QID) | INTRAVENOUS | Status: AC
  Administered 2018-01-11 (×3): 1000 mg via INTRAVENOUS
  Filled 2018-01-10 (×4): qty 100

## 2018-01-10 MED ORDER — SODIUM CHLORIDE 0.9 % IV SOLN
INTRAVENOUS | Status: DC
  Administered 2018-01-10: 17:00:00 via INTRAVENOUS

## 2018-01-10 MED ORDER — CARVEDILOL 12.5 MG PO TABS
12.5000 mg | ORAL_TABLET | Freq: Two times a day (BID) | ORAL | Status: DC
  Administered 2018-01-11 – 2018-01-12 (×3): 12.5 mg via ORAL
  Filled 2018-01-10 (×3): qty 1

## 2018-01-10 MED ORDER — NEOMYCIN-POLYMYXIN B GU 40-200000 IR SOLN
Status: DC | PRN
  Administered 2018-01-10: 14 mL

## 2018-01-10 MED ORDER — PROMETHAZINE HCL 25 MG/ML IJ SOLN: 6.2500 mg | INTRAMUSCULAR | Status: DC | PRN

## 2018-01-10 MED ORDER — BUPIVACAINE LIPOSOME 1.3 % IJ SUSP
INTRAMUSCULAR | Status: AC
  Filled 2018-01-10: qty 20

## 2018-01-10 MED ORDER — SODIUM CHLORIDE 0.9 % IV SOLN
INTRAVENOUS | Status: DC | PRN
  Administered 2018-01-10: 60 mL

## 2018-01-10 MED ORDER — METOCLOPRAMIDE HCL 10 MG PO TABS: 5.0000 mg | ORAL_TABLET | Freq: Three times a day (TID) | ORAL | Status: DC | PRN

## 2018-01-10 MED ORDER — FENTANYL CITRATE (PF) 100 MCG/2ML IJ SOLN
INTRAMUSCULAR | Status: DC | PRN
  Administered 2018-01-10 (×3): 50 ug via INTRAVENOUS

## 2018-01-10 MED ORDER — ONDANSETRON HCL 4 MG/2ML IJ SOLN
INTRAMUSCULAR | Status: DC | PRN
  Administered 2018-01-10: 4 mg via INTRAVENOUS

## 2018-01-10 MED ORDER — FENTANYL CITRATE (PF) 100 MCG/2ML IJ SOLN
INTRAMUSCULAR | Status: AC
  Filled 2018-01-10: qty 2

## 2018-01-10 MED ORDER — GABAPENTIN 300 MG PO CAPS
ORAL_CAPSULE | ORAL | Status: AC
  Administered 2018-01-10: 300 mg via ORAL
  Filled 2018-01-10: qty 1

## 2018-01-10 MED ORDER — TRANEXAMIC ACID 1000 MG/10ML IV SOLN
1000.0000 mg | Freq: Once | INTRAVENOUS | Status: AC
  Administered 2018-01-10: 1000 mg via INTRAVENOUS
  Filled 2018-01-10: qty 1000

## 2018-01-10 MED ORDER — CEFAZOLIN SODIUM-DEXTROSE 2-4 GM/100ML-% IV SOLN
2.0000 g | Freq: Four times a day (QID) | INTRAVENOUS | Status: AC
  Administered 2018-01-10 – 2018-01-11 (×4): 2 g via INTRAVENOUS
  Filled 2018-01-10 (×4): qty 100

## 2018-01-10 MED ORDER — FAMOTIDINE 20 MG PO TABS: 20.0000 mg | ORAL_TABLET | Freq: Once | ORAL | Status: DC

## 2018-01-10 MED ORDER — VITAMIN D 1000 UNITS PO TABS
2000.0000 [IU] | ORAL_TABLET | Freq: Every day | ORAL | Status: DC
  Administered 2018-01-11 – 2018-01-12 (×2): 2000 [IU] via ORAL
  Filled 2018-01-10 (×2): qty 2

## 2018-01-10 MED ORDER — MIDAZOLAM HCL 2 MG/2ML IJ SOLN
INTRAMUSCULAR | Status: AC
  Filled 2018-01-10: qty 2

## 2018-01-10 MED ORDER — ROCURONIUM BROMIDE 100 MG/10ML IV SOLN
INTRAVENOUS | Status: DC | PRN
  Administered 2018-01-10 (×3): 10 mg via INTRAVENOUS
  Administered 2018-01-10: 50 mg via INTRAVENOUS

## 2018-01-10 MED ORDER — PROPOFOL 10 MG/ML IV BOLUS
INTRAVENOUS | Status: DC | PRN
  Administered 2018-01-10: 130 mg via INTRAVENOUS

## 2018-01-10 MED ORDER — VITAMIN B-12 1000 MCG PO TABS
2000.0000 ug | ORAL_TABLET | Freq: Every day | ORAL | Status: DC
  Administered 2018-01-11 – 2018-01-12 (×2): 2000 ug via ORAL
  Filled 2018-01-10 (×2): qty 2

## 2018-01-10 MED ORDER — DEXAMETHASONE SODIUM PHOSPHATE 10 MG/ML IJ SOLN
INTRAMUSCULAR | Status: AC
  Administered 2018-01-10: 8 mg via INTRAVENOUS
  Filled 2018-01-10: qty 1

## 2018-01-10 MED ORDER — GABAPENTIN 300 MG PO CAPS
300.0000 mg | ORAL_CAPSULE | Freq: Once | ORAL | Status: AC
  Administered 2018-01-10: 300 mg via ORAL

## 2018-01-10 MED ORDER — OXYCODONE HCL 5 MG PO TABS: 5.0000 mg | ORAL_TABLET | Freq: Once | ORAL | Status: DC | PRN

## 2018-01-10 MED ORDER — TRAMADOL HCL 50 MG PO TABS
50.0000 mg | ORAL_TABLET | ORAL | Status: DC | PRN
  Administered 2018-01-10: 50 mg via ORAL
  Filled 2018-01-10: qty 1

## 2018-01-10 MED ORDER — NEOMYCIN-POLYMYXIN B GU 40-200000 IR SOLN
Status: AC
  Filled 2018-01-10: qty 20

## 2018-01-10 MED ORDER — METOCLOPRAMIDE HCL 10 MG PO TABS
10.0000 mg | ORAL_TABLET | Freq: Three times a day (TID) | ORAL | Status: DC
  Administered 2018-01-10 – 2018-01-12 (×6): 10 mg via ORAL
  Filled 2018-01-10 (×6): qty 1

## 2018-01-10 MED ORDER — LACTATED RINGERS IV SOLN
INTRAVENOUS | Status: DC
  Administered 2018-01-10 (×2): via INTRAVENOUS

## 2018-01-10 MED ORDER — LATANOPROST 0.005 % OP SOLN
1.0000 [drp] | Freq: Every day | OPHTHALMIC | Status: DC
  Administered 2018-01-10 – 2018-01-11 (×2): 1 [drp] via OPHTHALMIC
  Filled 2018-01-10: qty 2.5

## 2018-01-10 MED ORDER — SENNOSIDES-DOCUSATE SODIUM 8.6-50 MG PO TABS
1.0000 | ORAL_TABLET | Freq: Two times a day (BID) | ORAL | Status: DC
  Administered 2018-01-10 – 2018-01-12 (×4): 1 via ORAL
  Filled 2018-01-10 (×4): qty 1

## 2018-01-10 MED ORDER — HYDROMORPHONE HCL 1 MG/ML IJ SOLN: 0.5000 mg | INTRAMUSCULAR | Status: DC | PRN

## 2018-01-10 MED ORDER — OXYCODONE HCL 5 MG PO TABS: 10.0000 mg | ORAL_TABLET | ORAL | Status: DC | PRN

## 2018-01-10 MED ORDER — PANTOPRAZOLE SODIUM 40 MG PO TBEC
40.0000 mg | DELAYED_RELEASE_TABLET | Freq: Two times a day (BID) | ORAL | Status: DC
  Administered 2018-01-10 – 2018-01-12 (×4): 40 mg via ORAL
  Filled 2018-01-10 (×4): qty 1

## 2018-01-10 MED ORDER — BUPIVACAINE HCL (PF) 0.25 % IJ SOLN
INTRAMUSCULAR | Status: AC
  Filled 2018-01-10: qty 60

## 2018-01-10 MED ORDER — RALOXIFENE HCL 60 MG PO TABS
60.0000 mg | ORAL_TABLET | Freq: Every day | ORAL | Status: DC
  Administered 2018-01-11 – 2018-01-12 (×2): 60 mg via ORAL
  Filled 2018-01-10 (×2): qty 1

## 2018-01-10 MED ORDER — PROPOFOL 10 MG/ML IV BOLUS
INTRAVENOUS | Status: AC
  Filled 2018-01-10: qty 20

## 2018-01-10 MED ORDER — ACETAMINOPHEN 10 MG/ML IV SOLN
INTRAVENOUS | Status: AC
  Filled 2018-01-10: qty 100

## 2018-01-10 MED ORDER — MIDAZOLAM HCL 5 MG/5ML IJ SOLN
INTRAMUSCULAR | Status: DC | PRN
  Administered 2018-01-10: 2 mg via INTRAVENOUS

## 2018-01-10 MED ORDER — CHLORHEXIDINE GLUCONATE 4 % EX LIQD: 60.0000 mL | Freq: Once | CUTANEOUS | Status: DC

## 2018-01-10 MED ORDER — PRAVASTATIN SODIUM 20 MG PO TABS
40.0000 mg | ORAL_TABLET | Freq: Every day | ORAL | Status: DC
  Administered 2018-01-10 – 2018-01-11 (×2): 40 mg via ORAL
  Filled 2018-01-10 (×2): qty 2

## 2018-01-10 MED ORDER — LIDOCAINE HCL (CARDIAC) PF 100 MG/5ML IV SOSY
PREFILLED_SYRINGE | INTRAVENOUS | Status: DC | PRN
  Administered 2018-01-10: 80 mg via INTRAVENOUS

## 2018-01-10 MED ORDER — TETRACAINE HCL 1 % IJ SOLN
INTRAMUSCULAR | Status: AC
  Filled 2018-01-10: qty 2

## 2018-01-10 MED ORDER — GABAPENTIN 300 MG PO CAPS
300.0000 mg | ORAL_CAPSULE | Freq: Every day | ORAL | Status: DC
  Administered 2018-01-10 – 2018-01-11 (×2): 300 mg via ORAL
  Filled 2018-01-10 (×2): qty 1

## 2018-01-10 MED ORDER — FLEET ENEMA 7-19 GM/118ML RE ENEM: 1.0000 | ENEMA | Freq: Once | RECTAL | Status: DC | PRN

## 2018-01-10 MED ORDER — FENTANYL CITRATE (PF) 100 MCG/2ML IJ SOLN
25.0000 ug | INTRAMUSCULAR | Status: DC | PRN
  Administered 2018-01-10: 25 ug via INTRAVENOUS
  Administered 2018-01-10: 50 ug via INTRAVENOUS

## 2018-01-10 MED ORDER — OXYCODONE HCL 5 MG/5ML PO SOLN: 5.0000 mg | Freq: Once | ORAL | Status: DC | PRN

## 2018-01-10 SURGICAL SUPPLY — 72 items
ATTUNE PSFEM LTSZ4 NARCEM KNEE (Femur) ×2 IMPLANT
ATTUNE PSRP INSR SZ4 8 KNEE (Insert) ×1 IMPLANT
ATTUNE PSRP INSR SZ4 8MM KNEE (Insert) ×1 IMPLANT
BASEPLATE TIBIAL ROTATING SZ 4 (Knees) ×2 IMPLANT
BATTERY INSTRU NAVIGATION (MISCELLANEOUS) ×12 IMPLANT
BLADE SAW 70X12.5 (BLADE) ×3 IMPLANT
BLADE SAW 90X13X1.19 OSCILLAT (BLADE) ×3 IMPLANT
BLADE SAW 90X25X1.19 OSCILLAT (BLADE) ×3 IMPLANT
BONE CEMENT GENTAMICIN (Cement) ×6 IMPLANT
BSPLAT TIB 4 CMNT ROT PLAT STR (Knees) ×1 IMPLANT
BTRY SRG DRVR LF (MISCELLANEOUS) ×4
CANISTER SUCT 1200ML W/VALVE (MISCELLANEOUS) ×3 IMPLANT
CANISTER SUCT 3000ML PPV (MISCELLANEOUS) ×6 IMPLANT
CEMENT BONE GENTAMICIN 40 (Cement) IMPLANT
COOLER POLAR GLACIER W/PUMP (MISCELLANEOUS) ×3 IMPLANT
CUFF TOURN 24 STER (MISCELLANEOUS) IMPLANT
CUFF TOURN 30 STER DUAL PORT (MISCELLANEOUS) IMPLANT
DRAPE SHEET LG 3/4 BI-LAMINATE (DRAPES) ×3 IMPLANT
DRSG DERMACEA 8X12 NADH (GAUZE/BANDAGES/DRESSINGS) ×3 IMPLANT
DRSG OPSITE POSTOP 4X14 (GAUZE/BANDAGES/DRESSINGS) ×3 IMPLANT
DRSG TEGADERM 4X4.75 (GAUZE/BANDAGES/DRESSINGS) ×3 IMPLANT
DURAPREP 26ML APPLICATOR (WOUND CARE) ×6 IMPLANT
ELECT CAUTERY BLADE 6.4 (BLADE) ×3 IMPLANT
ELECT REM PT RETURN 9FT ADLT (ELECTROSURGICAL) ×3
ELECTRODE REM PT RTRN 9FT ADLT (ELECTROSURGICAL) ×1 IMPLANT
EX-PIN ORTHOLOCK NAV 4X150 (PIN) ×6 IMPLANT
GLOVE BIOGEL M STRL SZ7.5 (GLOVE) ×6 IMPLANT
GLOVE BIOGEL PI IND STRL 9 (GLOVE) ×1 IMPLANT
GLOVE BIOGEL PI INDICATOR 9 (GLOVE) ×2
GLOVE INDICATOR 8.0 STRL GRN (GLOVE) ×3 IMPLANT
GLOVE SURG SYN 9.0  PF PI (GLOVE) ×2
GLOVE SURG SYN 9.0 PF PI (GLOVE) ×1 IMPLANT
GOWN STRL REUS W/ TWL LRG LVL3 (GOWN DISPOSABLE) ×2 IMPLANT
GOWN STRL REUS W/TWL 2XL LVL3 (GOWN DISPOSABLE) ×3 IMPLANT
GOWN STRL REUS W/TWL LRG LVL3 (GOWN DISPOSABLE) ×6
HEMOVAC 400CC 10FR (MISCELLANEOUS) ×3 IMPLANT
HOLDER FOLEY CATH W/STRAP (MISCELLANEOUS) ×3 IMPLANT
HOOD PEEL AWAY FLYTE STAYCOOL (MISCELLANEOUS) ×6 IMPLANT
KIT TURNOVER KIT A (KITS) ×3 IMPLANT
KNIFE SCULPS 14X20 (INSTRUMENTS) ×3 IMPLANT
LABEL OR SOLS (LABEL) ×3 IMPLANT
NDL SAFETY ECLIPSE 18X1.5 (NEEDLE) ×1 IMPLANT
NDL SPNL 20GX3.5 QUINCKE YW (NEEDLE) ×2 IMPLANT
NEEDLE HYPO 18GX1.5 SHARP (NEEDLE) ×3
NEEDLE SPNL 20GX3.5 QUINCKE YW (NEEDLE) ×6 IMPLANT
NS IRRIG 500ML POUR BTL (IV SOLUTION) ×3 IMPLANT
PACK TOTAL KNEE (MISCELLANEOUS) ×3 IMPLANT
PAD WRAPON POLAR KNEE (MISCELLANEOUS) ×1 IMPLANT
PATELLA MEDIAL ATTUN 35MM KNEE (Knees) ×2 IMPLANT
PIN DRILL QUICK PACK ×3 IMPLANT
PIN FIXATION 1/8DIA X 3INL (PIN) ×9 IMPLANT
PULSAVAC PLUS IRRIG FAN TIP (DISPOSABLE) ×3
SOL .9 NS 3000ML IRR  AL (IV SOLUTION) ×2
SOL .9 NS 3000ML IRR AL (IV SOLUTION) ×1
SOL .9 NS 3000ML IRR UROMATIC (IV SOLUTION) ×1 IMPLANT
SOL PREP PVP 2OZ (MISCELLANEOUS) ×3
SOLUTION PREP PVP 2OZ (MISCELLANEOUS) ×1 IMPLANT
SPONGE DRAIN TRACH 4X4 STRL 2S (GAUZE/BANDAGES/DRESSINGS) ×3 IMPLANT
STAPLER SKIN PROX 35W (STAPLE) ×3 IMPLANT
STRAP TIBIA SHORT (MISCELLANEOUS) ×3 IMPLANT
SUCTION FRAZIER HANDLE 10FR (MISCELLANEOUS) ×2
SUCTION TUBE FRAZIER 10FR DISP (MISCELLANEOUS) ×1 IMPLANT
SUT VIC AB 0 CT1 36 (SUTURE) ×3 IMPLANT
SUT VIC AB 1 CT1 36 (SUTURE) ×6 IMPLANT
SUT VIC AB 2-0 CT2 27 (SUTURE) ×3 IMPLANT
SYR 20CC LL (SYRINGE) ×3 IMPLANT
SYR 30ML LL (SYRINGE) ×6 IMPLANT
TIP FAN IRRIG PULSAVAC PLUS (DISPOSABLE) ×1 IMPLANT
TOWEL OR 17X26 4PK STRL BLUE (TOWEL DISPOSABLE) ×3 IMPLANT
TOWER CARTRIDGE SMART MIX (DISPOSABLE) ×3 IMPLANT
TRAY FOLEY MTR SLVR 16FR STAT (SET/KITS/TRAYS/PACK) ×3 IMPLANT
WRAPON POLAR PAD KNEE (MISCELLANEOUS) ×3

## 2018-01-10 NOTE — Anesthesia Procedure Notes (Signed)
Procedure Name: Intubation Date/Time: 01/10/2018 12:06 PM Performed by: Lowry Bowl, CRNA Pre-anesthesia Checklist: Patient identified, Emergency Drugs available, Suction available, Patient being monitored and Timeout performed Patient Re-evaluated:Patient Re-evaluated prior to induction Oxygen Delivery Method: Circle system utilized Preoxygenation: Pre-oxygenation with 100% oxygen Induction Type: IV induction and Cricoid Pressure applied Ventilation: Mask ventilation without difficulty Laryngoscope Size: Mac and 3 Grade View: Grade II Tube type: Oral Tube size: 7.0 mm Number of attempts: 1 Airway Equipment and Method: Stylet Placement Confirmation: ETT inserted through vocal cords under direct vision,  positive ETCO2 and breath sounds checked- equal and bilateral Secured at: 21 cm Tube secured with: Tape Dental Injury: Teeth and Oropharynx as per pre-operative assessment

## 2018-01-10 NOTE — Transfer of Care (Signed)
Immediate Anesthesia Transfer of Care Note  Patient: [name]  Procedure(s) Performed: COMPUTER ASSISTED TOTAL KNEE ARTHROPLASTY (Left Knee)  Patient Location: PACU  Anesthesia Type:General  Level of Consciousness: awake, drowsy and patient cooperative  Airway & Oxygen Therapy: Patient Spontanous Breathing  Post-op Assessment: Report given to RN, Post -op Vital signs reviewed and stable and Patient moving all extremities  Post vital signs: Reviewed and stable  Last Vitals:  Vitals Value Taken Time  BP 122/53 01/10/2018  2:56 PM  Temp 36.5 C 01/10/2018  2:56 PM  Pulse 54 01/10/2018  3:01 PM  Resp 17 01/10/2018  3:01 PM  SpO2 95 % 01/10/2018  3:01 PM  Vitals shown include unvalidated device data.  Last Pain:  Vitals:   01/10/18 0955  TempSrc: Oral  PainSc: 0-No pain         Complications: No apparent anesthesia complications

## 2018-01-10 NOTE — Anesthesia Post-op Follow-up Note (Signed)
Anesthesia QCDR form completed.        

## 2018-01-10 NOTE — Anesthesia Preprocedure Evaluation (Addendum)
Anesthesia Evaluation  Patient identified by MRN, date of birth, ID band Patient awake    Reviewed: Allergy & Precautions, NPO status , Patient's Chart, lab work & pertinent test results, reviewed documented beta blocker date and time   History of Anesthesia Complications Negative for: history of anesthetic complications  Airway Mallampati: II  TM Distance: >3 FB Neck ROM: Full    Dental no notable dental hx.    Pulmonary neg pulmonary ROS, neg sleep apnea, neg COPD,    breath sounds clear to auscultation- rhonchi (-) wheezing      Cardiovascular hypertension, Pt. on medications (-) CAD, (-) Past MI, (-) Cardiac Stents and (-) CABG  Rhythm:Regular Rate:Normal - Systolic murmurs and - Diastolic murmurs    Neuro/Psych negative neurological ROS  negative psych ROS   GI/Hepatic Neg liver ROS, PUD, GERD  ,  Endo/Other  negative endocrine ROSneg diabetes  Renal/GU negative Renal ROS     Musculoskeletal  (+) Arthritis ,   Abdominal (+) - obese,   Peds  Hematology  (+) anemia ,   Anesthesia Other Findings Past Medical History: No date: Anemia No date: Anemia No date: Bladder cancer ([place]) 2016: Cancer ([place])     Comment:  bladder tumor 01/09/2014: Carotid arterial disease ([facility])     Comment:  Overview:  Minimal on screening  No date: Chronic low back pain No date: Colon polyp No date: DDD (degenerative disc disease), cervical No date: DDD (degenerative disc disease), lumbar No date: Elevated lipids No date: Erosive esophagitis No date: Foot pain No date: GERD (gastroesophageal reflux disease) No date: Glaucoma 04/2012: H/O concussion     Comment:  Due to fall at [facility] No date: Heart murmur No date: History of bone density study No date: History of chickenpox No date: HOH (hard of hearing)     Comment:  Bilateral  Hearing Aids No date: Hypertension No date: Lower back pain No date: Lower  extremity edema No date: Multinodular thyroid No date: Multiple gastric ulcers No date: Osteoporosis No date: 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[facility]) No date: Thyroid disease   Reproductive/Obstetrics                             Lab Results  Component Value Date   WBC 5.2 12/18/2017   HGB 11.8 (L) 12/18/2017   HCT 34.8 (L) 12/18/2017   MCV 98.3 12/18/2017   PLT 199 12/18/2017    Anesthesia Physical Anesthesia Plan  ASA: II  Anesthesia Plan: Spinal   Post-op Pain Management:    Induction:   PONV Risk Score and Plan: 2 and Propofol infusion, Midazolam and Ondansetron  Airway Management Planned: Natural Airway  Additional Equipment:   Intra-op Plan:   Post-operative Plan:   Informed Consent: I have reviewed the patients History and Physical, chart, labs and discussed the procedure including the risks, benefits and alternatives for the proposed anesthesia with the patient or authorized representative who has indicated his/her understanding and acceptance.   Dental advisory given  Plan Discussed with: CRNA and Anesthesiologist  Anesthesia Plan Comments:         Anesthesia Quick Evaluation

## 2018-01-10 NOTE — Discharge Instructions (Signed)
°  Instructions after Total Knee Replacement ° ° Dain Laseter P. Hendrick Pavich, Jr., M.D.    ° Dept. of Orthopaedics & Sports Medicine ° [facility] ° [address] ° [place], [place]  [address] ° Phone: [phone]   Fax: [phone] ° °  °DIET: °• Drink plenty of non-alcoholic fluids. °• Resume your normal diet. Include foods high in fiber. ° °ACTIVITY:  °• You may use crutches or a walker with weight-bearing as tolerated, unless instructed otherwise. °• You may be weaned off of the walker or crutches by your Physical Therapist.  °• Do NOT place pillows under the knee. Anything placed under the knee could limit your ability to straighten the knee.   °• Continue doing gentle exercises. Exercising will reduce the pain and swelling, increase motion, and prevent muscle weakness.   °• Please continue to use the TED compression stockings for 6 weeks. You may remove the stockings at night, but should reapply them in the morning. °• Do not drive or operate any equipment until instructed. ° °WOUND CARE:  °• Continue to use the PolarCare or ice packs periodically to reduce pain and swelling. °• You may bathe or shower after the staples are removed at the first office visit following surgery. ° °MEDICATIONS: °• You may resume your regular medications. °• Please take the pain medication as prescribed on the medication. °• Do not take pain medication on an empty stomach. °• You have been given a prescription for a blood thinner (Lovenox or Coumadin). Please take the medication as instructed. (NOTE: After completing a 2 week course of Lovenox, take one Enteric-coated aspirin once a day. This along with elevation will help reduce the possibility of phlebitis in your operated leg.) °• Do not drive or drink alcoholic beverages when taking pain medications. ° °CALL THE OFFICE FOR: °• Temperature above 101 degrees °• Excessive bleeding or drainage on the dressing. °• Excessive swelling, coldness, or paleness of the toes. °• Persistent  nausea and vomiting. ° °FOLLOW-UP:  °• You should have an appointment to return to the office in 10-14 days after surgery. °• Arrangements have been made for continuation of Physical Therapy (either home therapy or outpatient therapy). °  °

## 2018-01-10 NOTE — H&P (Signed)
The patient has been re-examined, and the chart reviewed, and there have been no interval changes to the documented history and physical.    The risks, benefits, and alternatives have been discussed at length. The patient expressed understanding of the risks benefits and agreed with plans for surgical intervention.  James P. Hooten, Jr. M.D.    

## 2018-01-10 NOTE — Op Note (Signed)
OPERATIVE NOTE  DATE OF SURGERY:  01/10/2018  PATIENT NAME:  [name]   DOB: Jun 04, 1932  MRN: [id number]  PRE-OPERATIVE DIAGNOSIS: Degenerative arthrosis of the left knee, primary  POST-OPERATIVE DIAGNOSIS:  Same  PROCEDURE:  Left total knee arthroplasty using computer-assisted navigation  SURGEON:  Marciano Sequin. M.D.  ASSISTANT:  Vance Peper, PA (present and scrubbed throughout the case, critical for assistance with exposure, retraction, instrumentation, and closure)  ANESTHESIA: general  ESTIMATED BLOOD LOSS: 50 mL  FLUIDS REPLACED: 1500 mL of crystalloid  TOURNIQUET TIME: 73 minutes  DRAINS: 2 medium Hemovac drains  SOFT TISSUE RELEASES: Anterior cruciate ligament, posterior cruciate ligament, deep and superficial medial collateral ligament, patellofemoral ligament  IMPLANTS UTILIZED: DePuy Attune size 4N posterior stabilized femoral component (cemented), size 4 rotating platform tibial component (cemented), 35 mm medialized dome patella (cemented), and an 8 mm stabilized rotating platform polyethylene insert.  INDICATIONS FOR SURGERY: [name] is a 84 y.o. year old female with a long history of progressive knee pain. X-rays demonstrated severe degenerative changes in tricompartmental fashion. The patient had not seen any significant improvement despite conservative nonsurgical intervention. After discussion of the risks and benefits of surgical intervention, the patient expressed understanding of the risks benefits and agree with plans for total knee arthroplasty.   The risks, benefits, and alternatives were discussed at length including but not limited to the risks of infection, bleeding, nerve injury, stiffness, blood clots, the need for revision surgery, cardiopulmonary complications, among others, and they were willing to proceed.  PROCEDURE IN DETAIL: The patient was brought into the operating room and, after adequate general anesthesia was  achieved, a tourniquet was placed on the patient's upper thigh. The patient's knee and leg were cleaned and prepped with alcohol and DuraPrep and draped in the usual sterile fashion. A "timeout" was performed as per usual protocol. The lower extremity was exsanguinated using an Esmarch, and the tourniquet was inflated to 300 mmHg. An anterior longitudinal incision was made followed by a standard mid vastus approach. The deep fibers of the medial collateral ligament were elevated in a subperiosteal fashion off of the medial flare of the tibia so as to maintain a continuous soft tissue sleeve. The patella was subluxed laterally and the patellofemoral ligament was incised. Inspection of the knee demonstrated severe degenerative changes with full-thickness loss of articular cartilage. Osteophytes were debrided using a rongeur. Anterior and posterior cruciate ligaments were excised. Two 4.0 mm Schanz pins were inserted in the femur and into the tibia for attachment of the array of trackers used for computer-assisted navigation. Hip center was identified using a circumduction technique. Distal landmarks were mapped using the computer. The distal femur and proximal tibia were mapped using the computer. The distal femoral cutting guide was positioned using computer-assisted navigation so as to achieve a 5 distal valgus cut. The femur was sized and it was felt that a size 4N femoral component was appropriate. A size 4 femoral cutting guide was positioned and the anterior cut was performed and verified using the computer. This was followed by completion of the posterior and chamfer cuts. Femoral cutting guide for the central box was then positioned in the center box cut was performed.  Attention was then directed to the proximal tibia. Medial and lateral menisci were excised. The extramedullary tibial cutting guide was positioned using computer-assisted navigation so as to achieve a 0 varus-valgus alignment and 3  posterior slope. The cut was performed and verified using the computer. The  proximal tibia was sized and it was felt that a size 4 tibial tray was appropriate. Tibial and femoral trials were inserted followed by insertion of a 5 mm polyethylene insert. The knee was felt to be tight medially. A Cobb elevator was used to elevate the superficial fibers of the medial collateral ligament.  The 5 mm polyethylene insert was replaced by an 8 mm polyethylene insert.  This allowed for excellent mediolateral soft tissue balancing both in flexion and in full extension. Finally, the patella was cut and prepared so as to accommodate a 35 mm medialized dome patella. A patella trial was placed and the knee was placed through a range of motion with excellent patellar tracking appreciated. The femoral trial was removed after debridement of posterior osteophytes. The central post-hole for the tibial component was reamed followed by insertion of a keel punch. Tibial trials were then removed. Cut surfaces of bone were irrigated with copious amounts of normal saline with antibiotic solution using pulsatile lavage and then suctioned dry. Polymethylmethacrylate cement with gentamicin was prepared in the usual fashion using a vacuum mixer. Cement was applied to the cut surface of the proximal tibia as well as along the undersurface of a size 4 rotating platform tibial component. Tibial component was positioned and impacted into place. Excess cement was removed using [employer] fast streamer. Cement was then applied to the cut surfaces of the femur as well as along the posterior flanges of the size 4N femoral component. The femoral component was positioned and impacted into place. Excess cement was removed using [employer] fast streamer. An 8 mm polyethylene trial was inserted and the knee was brought into full extension with steady axial compression applied. Finally, cement was applied to the backside of a 35 mm medialized dome patella and the patellar  component was positioned and patellar clamp applied. Excess cement was removed using [employer] fast streamer. After adequate curing of the cement, the tourniquet was deflated after a total tourniquet time of 73 minutes. Hemostasis was achieved using electrocautery. The knee was irrigated with copious amounts of normal saline with antibiotic solution using pulsatile lavage and then suctioned dry. 20 mL of 1.3% Exparel and 60 mL of 0.25% Marcaine in 40 mL of normal saline was injected along the posterior capsule, medial and lateral gutters, and along the arthrotomy site. An 8 mm stabilized rotating platform polyethylene insert was inserted and the knee was placed through a range of motion with excellent mediolateral soft tissue balancing appreciated and excellent patellar tracking noted. 2 medium drains were placed in the wound bed and brought out through separate stab incisions. The medial parapatellar portion of the incision was reapproximated using interrupted sutures of #1 Vicryl. Subcutaneous tissue was approximated in layers using first #0 Vicryl followed #2-0 Vicryl. The skin was approximated with skin staples. A sterile dressing was applied.  The patient tolerated the procedure well and was transported to the recovery room in stable condition.    Pascual Mantel P. Holley Bouche., M.D.

## 2018-01-10 NOTE — Anesthesia Postprocedure Evaluation (Signed)
Anesthesia Post Note  Patient: [name]  Procedure(s) Performed: COMPUTER ASSISTED TOTAL KNEE ARTHROPLASTY (Left Knee)  Patient location during evaluation: PACU Anesthesia Type: Spinal Level of consciousness: awake and alert and oriented Pain management: pain level controlled Vital Signs Assessment: post-procedure vital signs reviewed and stable Respiratory status: spontaneous breathing, nonlabored ventilation and respiratory function stable Cardiovascular status: blood pressure returned to baseline and stable Postop Assessment: no signs of nausea or vomiting Anesthetic complications: no     Last Vitals:  Vitals:   01/10/18 1528 01/10/18 1541  BP: (!) 130/51 (!) 135/52  Pulse: (!) 53 (!) 53  Resp: 18 12  Temp: (!) 36.2 C   SpO2: 99% 100%    Last Pain:  Vitals:   01/10/18 1456  TempSrc:   PainSc: 0-No pain                 Sofi Bryars

## 2018-01-10 NOTE — Progress Notes (Signed)
Patient admitted to room 149 from OR. A&O x4, but sleepy. Post-op dressing in place, CD&I. Hemovac, TEDs and foot pumps. Bone foam applied. IV fluids started. Bed alarm on for safety. Oriented to room, call light, TV and bed controls. Reviewed POC and orders.

## 2018-01-10 NOTE — Care Management (Signed)
Due to heavy workload, CM not able to meet wth patient and assess today.  Weekend CM will make it a priority

## 2018-01-11 MED ORDER — ENOXAPARIN SODIUM 40 MG/0.4ML ~~LOC~~ SOLN: 40.0000 mg | SUBCUTANEOUS | 0 refills | Status: DC

## 2018-01-11 MED ORDER — TRAMADOL HCL 50 MG PO TABS: 50.0000 mg | ORAL_TABLET | Freq: Four times a day (QID) | ORAL | 0 refills | Status: DC | PRN

## 2018-01-11 MED ORDER — OXYCODONE HCL 5 MG PO TABS: 5.0000 mg | ORAL_TABLET | ORAL | 0 refills | Status: DC | PRN

## 2018-01-11 NOTE — Clinical Social Work Note (Signed)
CSW received a consult for possible SNF placement. PT evaluation is pending. CSW will follow should PT recommend SNF. Currently, the plan is for discharge home with home health.  Santiago Bumpers, MSW, Latanya Presser [phone]

## 2018-01-11 NOTE — Progress Notes (Signed)
Physical Therapy Evaluation Patient Details Name: [name] MRN: [id number] DOB: 28-Mar-1933 Today's Date: 01/11/2018   History of Present Illness  Pt underwent L TKR without reported post-op complications. PT evaluation performed on POD#1. PMH includes bladder CA, DDD, GERD, osteoporosis, and hearing loss. Pt is eager to work with physical therapy  Clinical Impression  Pt admitted with above diagnosis. Pt currently with functional limitations due to the deficits listed below (see PT Problem List).  Pt is modified independent for bed mobility and CGA only for transfers. She requires cues for safe hand placement during transfers and takes two attempts to come to standing. Added time required to come to standing but once upright pt is steady with UE support on rolling walker. Pt is able to ambulate short distances out in the hallway. She is mildly unsteady with walker and somewhat impulsive but never requires external support from therapist. Pt requires repeated cues for proper sequencing with walker. She is able to complete all supine exercises as instructed with excellent LLE strength. L knee AAROM is 0-80 degrees. Pt will benefit from PT services to address deficits in strength, balance, and mobility in order to return to full function at home.     Follow Up Recommendations Home health PT    Equipment Recommendations  None recommended by PT;Other (comment)(Continue using rolling walker at discharge)    Recommendations for Other Services       Precautions / Restrictions Precautions Precautions: Knee Precaution Booklet Issued: Yes (comment) Required Braces or Orthoses: Knee Immobilizer - Left Knee Immobilizer - Left: Discontinue once straight leg raise with < 10 degree lag Restrictions Weight Bearing Restrictions: Yes LLE Weight Bearing: Weight bearing as tolerated      Mobility  Bed Mobility Overal bed mobility: Modified Independent             General bed mobility  comments: HOB elevated and use of bed rails. No external assist from therapist. Added time required to complete  Transfers Overall transfer level: Needs assistance Equipment used: Rolling walker (2 wheeled) Transfers: Sit to/from Stand Sit to Stand: Min guard         General transfer comment: Pt requires cues for safe hand placemetn and takes two attempts to come to standing. Added time required to come to standing but once upright pt is steady with UE support on rolling walker  Ambulation/Gait Ambulation/Gait assistance: Min guard Gait Distance (Feet): 50 Feet Assistive device: Rolling walker (2 wheeled)       General Gait Details: Pt is able to ambulate short distances out in the hallway. She is mildly unsteady with walker and somewhat impulsive but never requires externall support from therapist. Pt requires repeated cues for proper sequencing with walker  Stairs            Wheelchair Mobility    Modified Rankin (Stroke Patients Only)       Balance Overall balance assessment: Needs assistance Sitting-balance support: No upper extremity supported Sitting balance-Leahy Scale: Good     Standing balance support: Bilateral upper extremity supported Standing balance-Leahy Scale: Fair                               Pertinent Vitals/Pain Pain Assessment: 0-10 Pain Score: 4  Pain Location: L knee Pain Descriptors / Indicators: Operative site guarding Pain Intervention(s): Premedicated before session    Home Living Family/patient expects to be discharged to:: Private residence Living Arrangements: Alone Available Help  at Discharge: Family Type of Home: House Home Access: Stairs to enter Entrance Stairs-Rails: Right Entrance Stairs-Number of Steps: 3 Home Layout: One level        Prior Function Level of Independence: Needs assistance   Gait / Transfers Assistance Needed: Pt indepenet with ambulation using a rolling walker. 1 fall in the last 12  months.   ADL's / Homemaking Assistance Needed: Pt is independent with ADLs but receives assist with IADLs from sons. She still drives        Hand Dominance   Dominant Hand: Right    Extremity/Trunk Assessment   Upper Extremity Assessment Upper Extremity Assessment: Overall WFL for tasks assessed    Lower Extremity Assessment Lower Extremity Assessment: LLE deficits/detail LLE Deficits / Details: Pt is able to perform full SAQ and SLR x 15 without assistance. Reports intact sensation throughout LLE       Communication   Communication: HOH  Cognition Arousal/Alertness: Awake/alert Behavior During Therapy: WFL for tasks assessed/performed Overall Cognitive Status: Within Functional Limits for tasks assessed                                 General Comments: Pt struggles some to get the month but eventually states correctly without cues      General Comments      Exercises Total Joint Exercises Ankle Circles/Pumps: Both;15 reps Quad Sets: Both;15 reps Gluteal Sets: Both;15 reps Towel Squeeze: Both;15 reps Short Arc Quad: Left;15 reps Heel Slides: Left;15 reps Hip ABduction/ADduction: Left;15 reps Straight Leg Raises: Left;15 reps Goniometric ROM: 0-80 degrees AAROM, pain limited in flexion   Assessment/Plan    PT Assessment Patient needs continued PT services  PT Problem List Decreased strength;Decreased activity tolerance;Decreased balance       PT Treatment Interventions DME instruction;Therapeutic activities;Therapeutic exercise;Balance training    PT Goals (Current goals can be found in the Care Plan section)  Acute Rehab PT Goals Patient Stated Goal: Return to prior level of function  PT Goal Formulation: With patient Time For Goal Achievement: 01/25/18 Potential to Achieve Goals: Good    Frequency BID   Barriers to discharge Decreased caregiver support Lives alone but sons are nearby    Co-evaluation               AM-PAC  PT "6 Clicks" Daily Activity  Outcome Measure Difficulty turning over in bed (including adjusting bedclothes, sheets and blankets)?: None Difficulty moving from lying on back to sitting on the side of the bed? : A Little Difficulty sitting down on and standing up from a chair with arms (e.g., wheelchair, bedside commode, etc,.)?: A Little Help needed moving to and from a bed to chair (including a wheelchair)?: A Little Help needed walking in hospital room?: A Little Help needed climbing 3-5 steps with a railing? : A Lot 6 Click Score: 18    End of Session Equipment Utilized During Treatment: Gait belt Activity Tolerance: Patient tolerated treatment well Patient left: in chair;with call bell/phone within reach;with chair alarm set;with SCD's reapplied;Other (comment)(towel roll under heel, polar care in place)   PT Visit Diagnosis: Unsteadiness on feet (R26.81);Muscle weakness (generalized) (M62.81);History of falling (Z91.81)    Time: 1245-8099 PT Time Calculation (min) (ACUTE ONLY): 26 min   Charges:   PT Evaluation $PT Eval Low Complexity: 1 Low PT Treatments $Therapeutic Exercise: 8-22 mins        Laronn Devonshire D Obert Espindola PT, DPT, GCS  [name] 01/11/2018, 10:14 AM

## 2018-01-11 NOTE — Plan of Care (Signed)

## 2018-01-11 NOTE — Care Management Note (Signed)
Case Management Note  Patient Details  Name: [name] MRN: [id number] Date of Birth: 1932-06-15  Subjective/Objective:  RNCM consulted on patient for possible home health needs. Patient currently is POD1 from a knee replacement and lives alone. She has adult children who are close by to provide support. She has a walker and cane in the home. Patient still drives. Prefers [facility] for home health services at time of discharge. PCP is Ouida Sills. Uses [facility] for medications without issue.                    Action/Plan: RNCM to continue to follow for transition of care needs.    Expected Discharge Date:  01/12/18               Expected Discharge Plan:     In-House Referral:     Discharge planning Services  CM Consult  Post Acute Care Choice:  Home Health Choice offered to:  Patient  DME Arranged:    DME Agency:     HH Arranged:  PT [place]:  [facility] (formerly [facility])  Status of Service:  In process, will continue to follow  If discussed at Long Length of Stay Meetings, dates discussed:    Additional Comments:  Latanya Maudlin, RN 01/11/2018, 3:39 PM

## 2018-01-11 NOTE — Evaluation (Signed)
Occupational Therapy Evaluation Patient Details Name: [name] MRN: [id number] DOB: Nov 20, 1932 Today's Date: 01/11/2018    History of Present Illness Pt underwent L TKR without reported post-op complications. PT evaluation performed on POD#1. PMH includes bladder CA, DDD, GERD, osteoporosis, and hearing loss. Pt is eager to work with physical therapy   Clinical Impression   Pt present at OT eval up in chair - pt very motivated to participate on OT - pt need Min A for LB dressing  - did intiated this date education with reacher and sockaid - with pt having history of back surgery - pt ask about where to get AE - info provided - pt show min A to CG with functional mobilty to bathroom - no LOB - and good safety using RW - bilateral UE AROM WFL - did had bilateral shoulder surgery in past - decrease shoulder ROM and strength in R more than the L - pt report family close by to assist if needing help - have raised toilet seat. Pt can benefit from cont OT services in this setting     Follow Up Recommendations  Supervision/Assistance - 24 hour    Equipment Recommendations  Tub/shower seat;Toilet riser    Recommendations for Other Services       Precautions / Restrictions Precautions Precautions: None  Required Braces or Orthoses: Knee Immobilizer - Left Knee Immobilizer - Left: Discontinue once straight leg raise with < 10 degree lag Restrictions Weight Bearing Restrictions: Yes LLE Weight Bearing: Weight bearing as tolerated      Mobility             Balance Overall balance assessment: Needs assistance Sitting-balance support: No upper extremity supported Sitting balance-Leahy Scale: Good     Standing balance support: Bilateral upper extremity supported Standing balance-Leahy Scale: Fair                             ADL either performed or assessed with clinical judgement   ADL                                         General ADL  Comments: Min  A with LB dressing  -socks and shoes and toileting to bathroom   - independent  with setup for UB dressing ( pants) - Grooming and eating  - and then Bathing mod A      Vision Baseline Vision/History: Wears glasses       Perception     Praxis      Pertinent Vitals/Pain Pain Assessment: 0-10 Pain Score: 3  Pain Location: L knee Pain Descriptors / Indicators: Operative site guarding Pain Intervention(s): Premedicated before session     Hand Dominance Right   Extremity/Trunk Assessment          Communication Communication Communication: HOH   Cognition Arousal/Alertness: Awake/alert Behavior During Therapy: WFL for tasks assessed/performed Overall Cognitive Status: Within Functional Limits for tasks assessed                                Pt functional mobility to bathroom using RW - CG to min A - sit <> stand chair to RW min A to CG   no LOB - need min v/c for safety on and off commode in Bathroom - standing at  sink for washing hands - no LOB - good safety     General Comments       Exercises Other Exercises: Pt was ed on use of reacher and sockaid for socks and pants- pt had back surgery in past - and with L LE ROM impaired - pt request info where to get AE - provided    Shoulder Instructions      Home Living Family/patient expects to be discharged to:: Private residence Living Arrangements: Alone Available Help at Discharge: Family Type of Home: House Home Access: Stairs to enter Technical brewer of Steps: 3 Entrance Stairs-Rails: Right Home Layout: One level     Bathroom Shower/Tub: Occupational psychologist: (raised toiltet seats)                Prior Functioning/Environment Level of Independence: Needs assistance  Gait / Transfers Assistance Needed: Had 2-3 fall in the last 6 months  ADL's / Homemaking Assistance Needed: Pt is independent with ADLs but receives assist with IADLs from sons. She still  drives            OT Problem List: Decreased strength;Decreased knowledge of use of DME or AE;Decreased knowledge of precautions;Decreased activity tolerance;Impaired balance (sitting and/or standing);Decreased safety awareness      OT Treatment/Interventions: Self-care/ADL training;Balance training;DME and/or AE instruction;Patient/family education;Neuromuscular education    OT Goals(Current goals can be found in the care plan section) Acute Rehab OT Goals Patient Stated Goal: Return to prior level of function  OT Goal Formulation: With patient Time For Goal Achievement: 01/18/18 Potential to Achieve Goals: Good  OT Frequency: Min 4X/week   Barriers to D/C:            Co-evaluation              AM-PAC PT "6 Clicks" Daily Activity     Outcome Measure Help from another person eating meals?: A Little Help from another person taking care of personal grooming?: A Little Help from another person toileting, which includes using toliet, bedpan, or urinal?: A Little Help from another person bathing (including washing, rinsing, drying)?: A Lot Help from another person to put on and taking off regular upper body clothing?: A Little Help from another person to put on and taking off regular lower body clothing?: A Little 6 Click Score: 17   End of Session Equipment Utilized During Treatment: Gait belt  Activity Tolerance: Patient tolerated treatment well Patient left: in chair;with SCD's reapplied;with call bell/phone within reach;with chair alarm set  OT Visit Diagnosis: History of falling (Z91.81);Muscle weakness (generalized) (M62.81)       GOALS: 1; Pt to be modify ind using AE to donn and doff socks, shoes and pants  5 days  GOALS 2 : Functional mobility to BR/ toileting  to be close S to CG using RW with no LOB and good safety . 5 days                 Time: 1011-1040 OT Time Calculation (min): 29 min Charges:  OT General Charges $OT Visit: 1 Visit OT  Evaluation $OT Eval Low Complexity: 1 Low    Dusti Tetro OTR/L,CLT  01/11/2018, 12:21 PM

## 2018-01-11 NOTE — Progress Notes (Signed)
Physical Therapy Treatment Patient Details Name: [name] MRN: [id number] DOB: 09-14-1932 Today's Date: 01/11/2018    History of Present Illness Pt underwent L TKR without reported post-op complications. PT evaluation performed on POD#1. PMH includes bladder CA, DDD, GERD, osteoporosis, and hearing loss. Pt is eager to work with physical therapy    PT Comments    Pt demonstrates excellent performance with physical therapy this afternoon. She is able to complete all seated exercises with improving LLE strength noted. She is able to complete a full lap around RN station with therapist. She is much more stable this afternoon and does not require any external assist from therapist. Gait speed is functional for limited community ambulation. Pt reports mild DOE with ambulation but VSS throughout. Will attempts stair training in the AM. Pt will benefit from PT services to address deficits in strength, balance, and mobility in order to return to full function at home.   Follow Up Recommendations  Home health PT     Equipment Recommendations  None recommended by PT;Other (comment)(Continue using rolling walker at discharge)    Recommendations for Other Services       Precautions / Restrictions Precautions Precautions: None Precaution Booklet Issued: Yes (comment) Required Braces or Orthoses: Knee Immobilizer - Left Knee Immobilizer - Left: Discontinue once straight leg raise with < 10 degree lag Restrictions Weight Bearing Restrictions: Yes LLE Weight Bearing: Weight bearing as tolerated    Mobility  Bed Mobility               General bed mobility comments: Received and left upright in recliner  Transfers Overall transfer level: Needs assistance Equipment used: Rolling walker (2 wheeled) Transfers: Sit to/from Stand Sit to Stand: Min guard         General transfer comment: Safe hand placement and good sequencing. Able to self-correct for mild instability in  standing with use of rolling walker  Ambulation/Gait Ambulation/Gait assistance: Min guard Gait Distance (Feet): 200 Feet Assistive device: Rolling walker (2 wheeled)       General Gait Details: Pt is able to complete a full lap around RN station with therapist. She is much more stable this afternoon and does not require any external assist from therapist. Gait speed is functional for limited community ambulation. Pt reports mild DOE with ambulation   Stairs             Wheelchair Mobility    Modified Rankin (Stroke Patients Only)       Balance Overall balance assessment: Needs assistance Sitting-balance support: No upper extremity supported Sitting balance-Leahy Scale: Good     Standing balance support: Bilateral upper extremity supported Standing balance-Leahy Scale: Fair                              Cognition Arousal/Alertness: Awake/alert Behavior During Therapy: WFL for tasks assessed/performed Overall Cognitive Status: Within Functional Limits for tasks assessed                                        Exercises Total Joint Exercises Ankle Circles/Pumps: Both;15 reps(Heel raises) Hip ABduction/ADduction: Both;15 reps Long Arc Quad: Left;15 reps Knee Flexion: Left;15 reps Marching in Standing: Both;15 reps Other Exercises Other Exercises: Pt was ed on use of reacher and sockaid for socks and pants- pt had back surgery in past - and with L  LE ROM impaired - pt request info where to get AE - provided     General Comments        Pertinent Vitals/Pain Pain Assessment: 0-10 Pain Score: 3  Pain Location: L knee Pain Descriptors / Indicators: Operative site guarding Pain Intervention(s): Monitored during session    Home Living Family/patient expects to be discharged to:: Private residence Living Arrangements: Alone Available Help at Discharge: Family Type of Home: House Home Access: Stairs to enter Entrance Stairs-Rails:  Right Home Layout: One level        Prior Function    Gait / Transfers Assistance Needed: Had 2-3 fall in the last 6 months  ADL's / Homemaking Assistance Needed: Pt is independent with ADLs but receives assist with IADLs from sons. She still drives     PT Goals (current goals can now be found in the care plan section) Acute Rehab PT Goals Patient Stated Goal: Return to prior level of function  PT Goal Formulation: With patient Time For Goal Achievement: 01/25/18 Potential to Achieve Goals: Good Progress towards PT goals: Progressing toward goals    Frequency    BID      PT Plan Current plan remains appropriate    Co-evaluation              AM-PAC PT "6 Clicks" Daily Activity  Outcome Measure  Difficulty turning over in bed (including adjusting bedclothes, sheets and blankets)?: None Difficulty moving from lying on back to sitting on the side of the bed? : A Little Difficulty sitting down on and standing up from a chair with arms (e.g., wheelchair, bedside commode, etc,.)?: A Little Help needed moving to and from a bed to chair (including a wheelchair)?: None Help needed walking in hospital room?: None Help needed climbing 3-5 steps with a railing? : A Lot 6 Click Score: 20    End of Session Equipment Utilized During Treatment: Gait belt Activity Tolerance: Patient tolerated treatment well Patient left: in chair;with call bell/phone within reach;with chair alarm set;with SCD's reapplied;Other (comment)(towel roll under heel, polar care in place)   PT Visit Diagnosis: Unsteadiness on feet (R26.81);Muscle weakness (generalized) (M62.81);History of falling (Z91.81)     Time: 5102-5852 PT Time Calculation (min) (ACUTE ONLY): 15 min  Charges:  $Therapeutic Exercise: 8-22 mins                    Lyndel Safe Smrithi Pigford PT, DPT, GCS   Rosaleah Person 01/11/2018, 3:51 PM

## 2018-01-11 NOTE — Discharge Summary (Signed)
Physician Discharge Summary  Patient ID: [name] MRN: [id number] DOB/AGE: [birth year] 84 y.o.  Admit date: 01/10/2018 Discharge date: 01/12/2018  Admission Diagnoses:  PRIMARY OSTEOARTHRITIS OF LEFT KNEE   Discharge Diagnoses: Patient Active Problem List   Diagnosis Date Noted  . S/P total knee arthroplasty 01/10/2018  . Monoclonal gammopathy of unknown significance (MGUS) 06/11/2016  . Abscess of gluteal region 02/21/2016  . Psoas abscess ([place]) 02/21/2016  . Malignant neoplasm of urinary bladder ([place]) 08/23/2015  . History of repair of rotator cuff 08/23/2015  . Encounter for general adult medical examination without abnormal findings 08/04/2015  . Impingement syndrome of shoulder 03/30/2015  . Impingement syndrome of right shoulder 03/30/2015  . Anemia, iron deficiency 02/02/2015  . IDA (iron deficiency anemia) 01/26/2015  . Malignant neoplasm of bladder neck ([place]) 12/30/2014  . Colon polyp 12/17/2014  . Inflammation of sacroiliac joint ([place]) 09/22/2014  . Carotid arterial disease ([place]) 01/09/2014  . Combined fat and carbohydrate induced hyperlipemia 01/09/2014  . Accumulation of fluid in tissues 01/09/2014  . BP (high blood pressure) 01/09/2014  . Arthritis, degenerative 01/09/2014  . OP (osteoporosis) 01/09/2014  . Carotid artery disease ([place]) 01/09/2014  . Edema 01/09/2014  . Cervical osteoarthritis 10/28/2013  . DDD (degenerative disc disease), cervical 10/28/2013  . Acute blood loss anemia 05/16/2012  . Fracture of parietal bone ([place]) 05/16/2012  . Fall down stairs 05/16/2012  . Hemorrhage into subarachnoid space of neuraxis ([place]) 05/16/2012  . Subdural hematoma ([place]) 05/16/2012  . Fracture of temporal bone ([place]) 05/16/2012  . Traumatic subdural hemorrhage with loss of consciousness ([place]) 05/16/2012    Past Medical History:  Diagnosis Date  . Anemia   . Anemia   . Bladder cancer ([place])   . Cancer [facility]) 2016   bladder tumor  . Carotid arterial  disease ([place]) 01/09/2014   Overview:  Minimal on screening   . Chronic low back pain   . Colon polyp   . DDD (degenerative disc disease), cervical   . DDD (degenerative disc disease), lumbar   . Elevated lipids   . Erosive esophagitis   . Foot pain   . GERD (gastroesophageal reflux disease)   . Glaucoma   . H/O concussion 04/2012   Due to fall at [facility]  . Heart murmur   . History of bone density study   . History of chickenpox   . HOH (hard of hearing)    Bilateral  Hearing Aids  . Hypertension   . Lower back pain   . Lower extremity edema   . Multinodular thyroid   . Multiple gastric ulcers   . Osteoporosis   . 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[place])   . Thyroid disease      Transfusion: No transfusions on this admission   Consultants (if any):   Discharged Condition: Improved  Hospital Course: [name] is an 84 y.o. female who was admitted 01/10/2018 with a diagnosis of degenerative arthrosis left knee and went to the operating room on 01/10/2018 and underwent the above named procedures.    Surgeries:Procedure(s): COMPUTER ASSISTED TOTAL KNEE ARTHROPLASTY on 01/10/2018  PRE-OPERATIVE DIAGNOSIS: Degenerative arthrosis of the left knee, primary  POST-OPERATIVE DIAGNOSIS:  Same  PROCEDURE:  Left total knee arthroplasty using computer-assisted navigation  SURGEON:  Marciano Sequin. M.D.  ASSISTANT:  Vance Peper, PA (present and scrubbed throughout the case, critical for assistance with exposure, retraction, instrumentation, and closure)  ANESTHESIA: general  ESTIMATED BLOOD LOSS: 50 mL  FLUIDS REPLACED: 1500  mL of crystalloid  TOURNIQUET TIME: 73 minutes  DRAINS: 2 medium Hemovac drains  SOFT TISSUE RELEASES: Anterior cruciate ligament, posterior cruciate ligament, deep and superficial medial collateral ligament, patellofemoral ligament  IMPLANTS UTILIZED: DePuy Attune size 4N posterior stabilized femoral component (cemented), size 4  rotating platform tibial component (cemented), 35 mm medialized dome patella (cemented), and an 8 mm stabilized rotating platform polyethylene insert.  INDICATIONS FOR SURGERY: [name] is a 84 y.o. year old female with a long history of progressive knee pain. X-rays demonstrated severe degenerative changes in tricompartmental fashion. The patient had not seen any significant improvement despite conservative nonsurgical intervention. After discussion of the risks and benefits of surgical intervention, the patient expressed understanding of the risks benefits and agree with plans for total knee arthroplasty.   The risks, benefits, and alternatives were discussed at length including but not limited to the risks of infection, bleeding, nerve injury, stiffness, blood clots, the need for revision surgery, cardiopulmonary complications, among others, and they were willing to proceed. Patient tolerated the surgery well. No complications .Patient was taken to PACU where she was stabilized and then transferred to the orthopedic floor.  Patient started on Lovenox 30 mg q 12 hrs. Foot pumps applied bilaterally at 80 mm hgb. Heels elevated off bed with rolled towels. No evidence of DVT. Calves non tender. Negative [place]. Physical therapy started on day #1 for gait training and transfer with OT starting on  day #1 for ADL and assisted devices. Patient has done well with therapy. Ambulated greater than 200 feet upon being discharged.  Was able to ascend and descend 4 steps safely and independently  Patient's IV And Foley were discontinued on day #1 with Hemovac being discontinued on day #2. Dressing was changed on day 2 prior to patient being discharged   She was given perioperative antibiotics:  Anti-infectives (From admission, onward)   Start     Dose/Rate Route Frequency Ordered Stop   01/10/18 1800  ceFAZolin (ANCEF) IVPB 2g/100 mL premix     2 g 200 mL/hr over 30 Minutes Intravenous Every 6  hours 01/10/18 1554 01/11/18 1759   01/10/18 0945  ceFAZolin (ANCEF) IVPB 2g/100 mL premix     2 g 200 mL/hr over 30 Minutes Intravenous On call to O.R. 01/10/18 0944 01/10/18 1211   01/10/18 0929  ceFAZolin (ANCEF) 2-4 GM/100ML-% IVPB    Note to Pharmacy:  Norton Blizzard  : cabinet override      01/10/18 0929 01/10/18 1211   01/10/18 0600  ceFAZolin (ANCEF) IVPB 2g/100 mL premix  Status:  Discontinued     2 g 200 mL/hr over 30 Minutes Intravenous On call to O.R. 01/09/18 1957 01/10/18 0840    .  She was fitted with AV 1 compression foot pump devices, instructed on heel pumps, early ambulation, and fitted with TED stockings bilaterally for DVT prophylaxis.  She benefited maximally from the hospital stay and there were no complications.    Recent vital signs:  Vitals:   01/11/18 0041 01/11/18 0356  BP: (!) 144/58 (!) 135/59  Pulse: (!) 56 (!) 56  Resp: 17 17  Temp: (!) 97.5 F (36.4 C) 97.9 F (36.6 C)  SpO2: 98% 99%    Recent laboratory studies:  Lab Results  Component Value Date   HGB 11.8 (L) 12/18/2017   HGB 12.1 12/17/2017   HGB 11.6 (L) 08/14/2017   Lab Results  Component Value Date   WBC 5.2 12/18/2017   PLT 199 12/18/2017  Lab Results  Component Value Date   INR 1.09 12/25/2017   Lab Results  Component Value Date   NA 140 12/25/2017   K 3.5 12/25/2017   CL 107 12/25/2017   CO2 27 12/25/2017   BUN 23 12/25/2017   CREATININE 0.98 12/25/2017   GLUCOSE 180 (H) 12/25/2017    Discharge Medications:   Allergies as of 01/11/2018   No Known Allergies     Medication List    TAKE these medications   calcium-vitamin D 500-200 MG-UNIT tablet Commonly known as:  OSCAL WITH D Take 1 tablet by mouth daily.   carvedilol 12.5 MG tablet Commonly known as:  COREG Take 12.5 mg by mouth 2 (two) times daily with a meal.   Cholecalciferol 2000 units Tabs Take 2,000 Units by mouth daily.   diltiazem 240 MG 24 hr capsule Commonly known as:  CARDIZEM  CD Take 240 mg by mouth daily.   enoxaparin 40 MG/0.4ML injection Commonly known as:  LOVENOX Inject 0.4 mLs (40 mg total) into the skin daily for 14 days. Start taking on:  01/13/2018   latanoprost 0.005 % ophthalmic solution Commonly known as:  XALATAN Place 1 drop into both eyes at bedtime.   oxyCODONE 5 MG immediate release tablet Commonly known as:  Oxy IR/ROXICODONE Take 1 tablet (5 mg total) by mouth every 4 (four) hours as needed for moderate pain (pain score 4-6).   pravastatin 40 MG tablet Commonly known as:  PRAVACHOL Take 40 mg by mouth at bedtime.   raloxifene 60 MG tablet Commonly known as:  EVISTA Take 60 mg by mouth daily.   traMADol 50 MG tablet Commonly known as:  ULTRAM Take 1-2 tablets (50-100 mg total) by mouth every 6 (six) hours as needed for moderate pain.   vitamin B-12 1000 MCG tablet Commonly known as:  CYANOCOBALAMIN Take 2,000 mcg by mouth daily.            Durable Medical Equipment  (From admission, onward)         Start     Ordered   01/10/18 1555  DME Walker rolling  Once    Question:  Patient needs a walker to treat with the following condition  Answer:  Total knee replacement status   01/10/18 1554   01/10/18 1555  DME Bedside commode  Once    Question:  Patient needs a bedside commode to treat with the following condition  Answer:  Total knee replacement status   01/10/18 1554          Diagnostic Studies: Dg Esophagus  Result Date: 12/19/2017 CLINICAL DATA:  Difficulty swallowing liquids solids and pills for the past 5 months. The patient gets choked and has strangling sensation. History of previous esophagram in 2016. In the past esophageal dilation has been recommended. EXAM: ESOPHOGRAM / BARIUM SWALLOW / BARIUM TABLET STUDY TECHNIQUE: Combined double contrast and single contrast examination performed using effervescent crystals, thick barium liquid, and thin barium liquid. The patient was observed with fluoroscopy swallowing  a 13 mm barium sulphate tablet. FLUOROSCOPY TIME:  Fluoroscopy Time:  2 minutes, 12 seconds Radiation Exposure Index (if provided by the fluoroscopic device): 36.1 mGy Number of Acquired Spot Images: 11+ 1 video loop. COMPARISON:  Modified barium swallow of November 21, 2017 and standard barium esophagram of March 17, 2015 FINDINGS: The hypopharynx distended well. There was a tiny amount of laryngeal penetration of the barium which occurred intermittently but cleared with coughing. This did elicit a cough reflex. The  proximal cervical esophagus distended well as did the thoracic esophagus down to a moderate-sized reducible hiatal hernia. Moderate narrowing at the GE junction was observed which was obstructive to passage of the barium tablet. The tablet was allowed to dissolve. There was no obstruction to passage of liquid barium. Mild tertiary contractions were observed intermittently. IMPRESSION: Minimal laryngeal penetration of the barium occurs occasionally and elicits a cough reflex which clears the barium. Moderate-sized reducible hiatal hernia. Moderate narrowing at the GE junction which is obstructive to passage of the barium tablet. No ulceration or esophageal mass. No reflux was observed. Direct visualization of the esophagus is recommended with consideration for dilation. Electronically Signed   By: David  Martinique M.D.   On: 12/19/2017 10:53   Dg Knee Left Port  Result Date: 01/10/2018 CLINICAL DATA:  Status post total knee replacement EXAM: PORTABLE LEFT KNEE - 1-2 VIEW COMPARISON:  None. FINDINGS: Frontal and lateral views were obtained. There is evidence of total knee replacement with femoral and tibial prosthetic components well-seated. No fracture or dislocation. There is a small joint effusion. There is a drain in the knee joint. There are overlying skin staples. IMPRESSION: Status post total knee replacement with femoral and tibial prosthetic components well-seated. No fracture or dislocation.  Small joint effusion. Electronically Signed   By: Lowella Grip III M.D.   On: 01/10/2018 15:16    Disposition:   Discharge Instructions    Increase activity slowly   Complete by:  As directed       Follow-up Information    Watt Climes, PA On 01/24/2018.   Specialty:  Physician Assistant Why:  at 9:15am Contact information: [place] [address] [phone]        Dereck Leep, MD On 03/04/2018.   Specialty:  Orthopedic Surgery Why:  at 3:45pm Contact information: [place] [address] [phone]            Signed: Watt Climes 01/11/2018, 7:36 AM

## 2018-01-11 NOTE — Progress Notes (Signed)
   Subjective: 1 Day Post-Op Procedure(s) (LRB): COMPUTER ASSISTED TOTAL KNEE ARTHROPLASTY (Left) Patient reports pain as 5 on 0-10 scale.   Patient is well, and has had no acute complaints or problems We will start therapy today.  Plan is to go Home after hospital stay. no nausea and no vomiting Patient denies any chest pains or shortness of breath. Well during the night.  Voicing no complaints this morning.  Objective: Vital signs in last 24 hours: Temp:  [97.1 F (36.2 C)-97.9 F (36.6 C)] 97.9 F (36.6 C) (08/17 0356) Pulse Rate:  [47-72] 56 (08/17 0356) Resp:  [12-18] 17 (08/17 0356) BP: (122-158)/(50-90) 135/59 (08/17 0356) SpO2:  [94 %-100 %] 99 % (08/17 0356) Weight:  [70.4 kg] 70.4 kg (08/16 1600) Heels are non tender and elevated off the bed using rolled towels along with bone foam under operative heel  Intake/Output from previous day: 08/16 0701 - 08/17 0700 In: 5705 [I.V.:2751.7; IV Piggyback:2953.3] Out: 2320 [Urine:2150; Drains:120; Blood:50] Intake/Output this shift: Total I/O In: 236.7 [I.V.:236.7] Out: -   No results for input(s): HGB in the last 72 hours. No results for input(s): WBC, RBC, HCT, PLT in the last 72 hours. No results for input(s): NA, K, CL, CO2, BUN, CREATININE, GLUCOSE, CALCIUM in the last 72 hours. No results for input(s): LABPT, INR in the last 72 hours.  EXAM General - Patient is Alert, Appropriate and Oriented Extremity - Neurologically intact Neurovascular intact Sensation intact distally Intact pulses distally Dorsiflexion/Plantar flexion intact Compartment soft Dressing - dressing C/D/I Motor Function - intact, moving foot and toes well on exam.  Able to do straight leg raise on her own  Past Medical History:  Diagnosis Date  . Anemia   . Anemia   . Bladder cancer ([place])   . Cancer [facility]) 2016   bladder tumor  . Carotid arterial disease ([place]) 01/09/2014   Overview:  Minimal on screening   . Chronic low back pain   .  Colon polyp   . DDD (degenerative disc disease), cervical   . DDD (degenerative disc disease), lumbar   . Elevated lipids   . Erosive esophagitis   . Foot pain   . GERD (gastroesophageal reflux disease)   . Glaucoma   . H/O concussion 04/2012   Due to fall at [facility]  . Heart murmur   . History of bone density study   . History of chickenpox   . HOH (hard of hearing)    Bilateral  Hearing Aids  . Hypertension   . Lower back pain   . Lower extremity edema   . Multinodular thyroid   . Multiple gastric ulcers   . Osteoporosis   . 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[place])   . Thyroid disease     Assessment/Plan: 1 Day Post-Op Procedure(s) (LRB): COMPUTER ASSISTED TOTAL KNEE ARTHROPLASTY (Left) Active Problems:   S/P total knee arthroplasty  Estimated body mass index is 29.33 kg/m as calculated from the following:   Height as of this encounter: 5\' 1"  (1.549 m).   Weight as of this encounter: 70.4 kg. Advance diet Up with therapy D/C IV fluids Plan for discharge tomorrow Discharge home with home health  Labs: None DVT Prophylaxis - Lovenox, Foot Pumps and TED hose Weight-Bearing as tolerated to left leg D/C O2 and Pulse OX and try on Room Air Begin working on bowel movement  Bayron Dalto R. [place] 01/11/2018, 7:31 AM

## 2018-01-12 ENCOUNTER — Encounter: Payer: Self-pay | Admitting: Orthopedic Surgery

## 2018-01-12 NOTE — Progress Notes (Signed)
Patient discharging home. IV removed. Instructions given to patient, Prescriptions given. Waiting on son to come transport patient home. Dressing changed on left knee.

## 2018-01-12 NOTE — Progress Notes (Signed)
Physical Therapy Treatment Patient Details Name: [name] MRN: [id number] DOB: Feb 01, 1933 Today's Date: 01/12/2018    History of Present Illness Pt underwent L TKR without reported post-op complications. PT evaluation performed on POD#1. PMH includes bladder CA, DDD, GERD, osteoporosis, and hearing loss. Pt is eager to work with physical therapy    PT Comments    Pt demonstrates excellent performance with physical therapy this morning. She is able to complete all supine and seated exercises with improving LLE strength noted. Pt is able to complete a full lap around RN station with therapist as well as walk into rehab gym to practice stairs. Gait speed is improved and pt denies pain today during ambulation. Pt denies DOE and no signs of respiratory distress. Pt is able to ambulate with reciprocal pattern. Education and cues provided for proper stair sequencing. Pt is able to safely and confidently complete stair training. Education provided about how to perform steps with only one railing. Pt has met all PT goals for discharge and is safe to return home with family support and [facility] PT when medically appropriate. Pt will benefit from PT services to address deficits in strength, balance, and mobility in order to return to full function at home.     Follow Up Recommendations  Home health PT     Equipment Recommendations  None recommended by PT;Other (comment)(Continue using rolling walker at discharge)    Recommendations for Other Services       Precautions / Restrictions Precautions Precautions: None Precaution Booklet Issued: Yes (comment) Required Braces or Orthoses: Knee Immobilizer - Left Knee Immobilizer - Left: Discontinue once straight leg raise with < 10 degree lag Restrictions Weight Bearing Restrictions: Yes LLE Weight Bearing: Weight bearing as tolerated    Mobility  Bed Mobility Overal bed mobility: Modified Independent             General bed mobility  comments: Good speed and sequencing without external assistance  Transfers Overall transfer level: Needs assistance Equipment used: Rolling walker (2 wheeled) Transfers: Sit to/from Stand Sit to Stand: Supervision         General transfer comment: Safe hand placement and good sequencing. Improved stability today.  Ambulation/Gait Ambulation/Gait assistance: Min guard Gait Distance (Feet): 220 Feet Assistive device: Rolling walker (2 wheeled)       General Gait Details: Pt is able to complete a full lap around RN station with therapist as well as walk into rehab gym to practice stairs. Gait speed is improved and pt denies pain today during ambulation. Pt denies DOE and no signs of respiratory distress. Pt is able to ambulate with reciprocal pattern.   Stairs Stairs: Yes Stairs assistance: Min guard Stair Management: Two rails;Step to pattern Number of Stairs: 4 General stair comments: Education and cues provided for proper stair sequencing. Pt is able to safely and confidently complete stair training. Education provided about how to perform steps with only one railing.    Wheelchair Mobility    Modified Rankin (Stroke Patients Only)       Balance Overall balance assessment: Needs assistance Sitting-balance support: No upper extremity supported Sitting balance-Leahy Scale: Good     Standing balance support: Bilateral upper extremity supported Standing balance-Leahy Scale: Fair                              Cognition Arousal/Alertness: Awake/alert Behavior During Therapy: WFL for tasks assessed/performed Overall Cognitive Status: Within Functional Limits for tasks  assessed                                        Exercises Total Joint Exercises Ankle Circles/Pumps: Both;15 reps Quad Sets: Both;15 reps Gluteal Sets: Both;15 reps Towel Squeeze: Both;15 reps Heel Slides: Left;15 reps Hip ABduction/ADduction: 15 reps;Left Straight Leg  Raises: Left;15 reps Long Arc Quad: Left;15 reps Knee Flexion: Left;15 reps Goniometric ROM: 0-100 degrees AAROM, pain limited in flexion    General Comments        Pertinent Vitals/Pain Pain Assessment: No/denies pain    Home Living                      Prior Function            PT Goals (current goals can now be found in the care plan section) Acute Rehab PT Goals Patient Stated Goal: Return to prior level of function  PT Goal Formulation: With patient Time For Goal Achievement: 01/25/18 Potential to Achieve Goals: Good Progress towards PT goals: Progressing toward goals    Frequency    BID      PT Plan Current plan remains appropriate    Co-evaluation              AM-PAC PT "6 Clicks" Daily Activity  Outcome Measure  Difficulty turning over in bed (including adjusting bedclothes, sheets and blankets)?: None Difficulty moving from lying on back to sitting on the side of the bed? : None Difficulty sitting down on and standing up from a chair with arms (e.g., wheelchair, bedside commode, etc,.)?: None Help needed moving to and from a bed to chair (including a wheelchair)?: None Help needed walking in hospital room?: None Help needed climbing 3-5 steps with a railing? : A Little 6 Click Score: 23    End of Session Equipment Utilized During Treatment: Gait belt Activity Tolerance: Patient tolerated treatment well Patient left: in chair;with call bell/phone within reach;with chair alarm set;with SCD's reapplied;Other (comment)(towel roll under heel, polar care in place)   PT Visit Diagnosis: Unsteadiness on feet (R26.81);Muscle weakness (generalized) (M62.81);History of falling (Z91.81)     Time: 6063-0160 PT Time Calculation (min) (ACUTE ONLY): 24 min  Charges:  $Gait Training: 8-22 mins $Therapeutic Exercise: 8-22 mins                     Lyndel Safe Huprich PT, DPT, GCS    Huprich,[name] 01/12/2018, 9:33 AM

## 2018-01-12 NOTE — Care Management Note (Signed)
Case Management Note  Patient Details  Name: ESTLE SABELLA MRN: [id number] Date of Birth: 30-May-1932  Subjective/Objective:   RNCM team met with Allayne Stack in yesterday in anticipation of discharge with home health needs. Referral placed yesterday with Helene Kelp from [facility] who was made aware today of discharge. Patient accept by [facility] for PT services. Family to transport home. No further RNCM needs.                  Action/Plan:   Expected Discharge Date:  01/12/18               Expected Discharge Plan:  [place]  In-House Referral:     Discharge planning Services  CM Consult  Post Acute Care Choice:  Home Health Choice offered to:  Patient  DME Arranged:    DME Agency:     HH Arranged:  PT [place]:  [facility] (formerly [employer])  Status of Service:  Completed, signed off  If discussed at [employer], dates discussed:    Additional Comments:  [place] A Jakyri Brunkhorst, RN 01/12/2018, 8:25 AM

## 2018-01-12 NOTE — Progress Notes (Signed)
   Subjective: 2 Days Post-Op Procedure(s) (LRB): COMPUTER ASSISTED TOTAL KNEE ARTHROPLASTY (Left) Patient reports pain as mild.   Patient is well, and has had no acute complaints or problems Patient is very well with physical therapy yesterday.  Was able to ambulate around the nurses this.  Still needs to do stairs prior to being discharged Plan is to go Home after hospital stay. no nausea and no vomiting Patient denies any chest pains or shortness of breath. She states that she rested much better last night  Objective: Vital signs in last 24 hours: Temp:  [97.8 F (36.6 C)-98.6 F (37 C)] 98.6 F (37 C) (08/18 0732) Pulse Rate:  [53-56] 53 (08/18 0732) Resp:  [12-18] 15 (08/18 0732) BP: (135-160)/(53-62) 160/53 (08/18 0732) SpO2:  [94 %-98 %] 95 % (08/18 0732) well approximated incision Heels are non tender and elevated off the bed using rolled towels Intake/Output from previous day: 08/17 0701 - 08/18 0700 In: 1480 [P.O.:480; I.V.:600; IV Piggyback:400] Out: 150 [Drains:150] Intake/Output this shift: No intake/output data recorded.  No results for input(s): HGB in the last 72 hours. No results for input(s): WBC, RBC, HCT, PLT in the last 72 hours. No results for input(s): NA, K, CL, CO2, BUN, CREATININE, GLUCOSE, CALCIUM in the last 72 hours. No results for input(s): LABPT, INR in the last 72 hours.  EXAM General - Patient is Alert, Appropriate and Oriented Extremity - Neurologically intact Neurovascular intact Sensation intact distally Intact pulses distally Dorsiflexion/Plantar flexion intact No cellulitis present Compartment soft Dressing - dressing C/D/I Motor Function - intact, moving foot and toes well on exam.    Past Medical History:  Diagnosis Date  . Anemia   . Anemia   . Bladder cancer ([place])   . Cancer [facility]) 2016   bladder tumor  . Carotid arterial disease ([place]) 01/09/2014   Overview:  Minimal on screening   . Chronic low back pain   . Colon  polyp   . DDD (degenerative disc disease), cervical   . DDD (degenerative disc disease), lumbar   . Elevated lipids   . Erosive esophagitis   . Foot pain   . GERD (gastroesophageal reflux disease)   . Glaucoma   . H/O concussion 04/2012   Due to fall at [facility]  . Heart murmur   . History of bone density study   . History of chickenpox   . HOH (hard of hearing)    Bilateral  Hearing Aids  . Hypertension   . Lower back pain   . Lower extremity edema   . Multinodular thyroid   . Multiple gastric ulcers   . Osteoporosis   . 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[place])   . Thyroid disease     Assessment/Plan: 2 Days Post-Op Procedure(s) (LRB): COMPUTER ASSISTED TOTAL KNEE ARTHROPLASTY (Left) Active Problems:   S/P total knee arthroplasty  Estimated body mass index is 29.33 kg/m as calculated from the following:   Height as of this encounter: 5\' 1"  (1.549 m).   Weight as of this encounter: 70.4 kg. Up with therapy Discharge home with home health  Labs: None DVT Prophylaxis - Lovenox, Foot Pumps and TED hose Weight-Bearing as tolerated to left leg Hemovac discontinued today.  Into the drain appeared to be intact. Please wash operative leg, change dressing and apply TED stockings to both legs Please give the patient 2 extra honeycomb dressings to take home  [place]. Dixon Sweetwater 01/12/2018, 7:56 AM

## 2018-01-13 ENCOUNTER — Encounter: Payer: Self-pay | Admitting: Internal Medicine

## 2018-02-06 ENCOUNTER — Other Ambulatory Visit: Payer: Self-pay | Admitting: Internal Medicine

## 2018-02-06 DIAGNOSIS — Z1231 Encounter for screening mammogram for malignant neoplasm of breast: Secondary | ICD-10-CM

## 2018-03-10 ENCOUNTER — Ambulatory Visit
Admission: RE | Admit: 2018-03-10 | Discharge: 2018-03-10 | Disposition: A | Discharge status: Home / Self Care | Payer: Medicare Other | Source: Ambulatory Visit | Attending: Internal Medicine | Admitting: Internal Medicine

## 2018-03-10 DIAGNOSIS — Z1231 Encounter for screening mammogram for malignant neoplasm of breast: Secondary | ICD-10-CM | POA: Insufficient documentation

## 2018-03-10 HISTORY — DX: Personal history of antineoplastic chemotherapy: Z92.21

## 2018-04-08 ENCOUNTER — Ambulatory Visit (INDEPENDENT_AMBULATORY_CARE_PROVIDER_SITE_OTHER): Payer: Medicare Other | Admitting: Vascular Surgery

## 2018-04-08 ENCOUNTER — Encounter (INDEPENDENT_AMBULATORY_CARE_PROVIDER_SITE_OTHER): Payer: Self-pay | Admitting: Vascular Surgery

## 2018-04-08 VITALS — BP 181/79 | HR 58 | Resp 16 | Ht 61.0 in | Wt 153.6 lb

## 2018-04-08 DIAGNOSIS — I10 Essential (primary) hypertension: Secondary | ICD-10-CM | POA: Diagnosis not present

## 2018-04-08 DIAGNOSIS — I83893 Varicose veins of bilateral lower extremities with other complications: Secondary | ICD-10-CM | POA: Diagnosis not present

## 2018-04-08 DIAGNOSIS — Z96652 Presence of left artificial knee joint: Secondary | ICD-10-CM | POA: Diagnosis not present

## 2018-04-08 DIAGNOSIS — R609 Edema, unspecified: Secondary | ICD-10-CM | POA: Diagnosis not present

## 2018-04-08 NOTE — Assessment & Plan Note (Signed)
Left leg is little more swollen and painful.

## 2018-04-08 NOTE — Patient Instructions (Signed)

## 2018-04-08 NOTE — Assessment & Plan Note (Signed)
blood pressure control important in reducing the progression of atherosclerotic disease. On appropriate oral medications.  

## 2018-04-08 NOTE — Progress Notes (Signed)
Patient ID: ZAFIRAH VANZEE, female   DOB: Feb 11, 1933, 84 y.o.   MRN: [id number]  Chief Complaint  Patient presents with  . New Patient (Initial Visit)    ref for varicose veins    HPI [name] is a 84 y.o. female. The patient presents with complaints of symptomatic varicosities of the lower extremities. The patient reports a long standing history of varicosities and they have become painful over time. There was no clear inciting event or causative factor that started the symptoms.  The left leg is more severly affected. The patient elevates the legs for relief. The Hahn is described as tiredness and aching in the legs. The symptoms are generally most severe in the evening, particularly when they have been on their feet for long periods of time.  Compression stockings has been used to try to improve the symptoms with mid to success. The patient complains of worsening swelling as an associated symptom.  Both legs well but the left is a little worse.  The left leg is also the leg that has already had a knee replacement.  The right leg needs a knee replacement as well.  The patient has no previous history of deep venous thrombosis or superficial thrombophlebitis to their knowledge.     Past Medical History:  Diagnosis Date  . Anemia   . Anemia   . Bladder cancer ([place])   . Cancer [facility]) 2016   bladder tumor  . Carotid arterial disease ([place]) 01/09/2014   Overview:  Minimal on screening   . Chronic low back Hahn   . Colon polyp   . DDD (degenerative disc disease), cervical   . DDD (degenerative disc disease), lumbar   . Elevated lipids   . Erosive esophagitis   . Foot Hahn   . GERD (gastroesophageal reflux disease)   . Glaucoma   . H/O concussion 04/2012   Due to fall at [place]  . Heart murmur   . History of bone density study   . History of chickenpox   . HOH (hard of hearing)    Bilateral  Hearing Aids  . Hypertension   . Lower back Hahn   .  Lower extremity edema   . Multinodular thyroid   . Multiple gastric ulcers   . Osteoporosis   . Personal history of chemotherapy    bladder  . 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[place])   . Thyroid disease     Past Surgical History:  Procedure Laterality Date  . ABDOMINAL HYSTERECTOMY  1973  . APPLICATION OF WOUND VAC Right 01/17/2017   Procedure: APPLICATION OF WOUND VAC;  Surgeon: Hessie Knows, MD;  Location: [facility] ORS;  Service: Orthopedics;  Laterality: Right;  . BACK SURGERY     03/1975-ruptured disk, 11/1983-ruptured disk, 11/1984-ruptured disk, 10/1996-ruptured disk, 08/1997-spinal fusion, 02/10/07-spinal fusion [facility]  . BACK SURGERY     Spinal Fusion X 2  . CARPAL TUNNEL RELEASE Right   . CATARACT EXTRACTION Bilateral   . CYSTOSCOPY W/ RETROGRADES Bilateral 09/29/2014   Procedure: CYSTOSCOPY WITH RETROGRADE PYELOGRAM;  Surgeon: Hollice Espy, MD;  Location: [facility] ORS;  Service: Urology;  Laterality: Bilateral;  . CYSTOSCOPY WITH BIOPSY N/A 11/02/2014   Procedure: CYSTOSCOPY WITH BIOPSY/WITH MITOMYCIN;  Surgeon: Hollice Espy, MD;  Location: [facility] ORS;  Service: Urology;  Laterality: N/A;  . ESOPHAGOGASTRODUODENOSCOPY (EGD) WITH PROPOFOL N/A 05/10/2015   Procedure: ESOPHAGOGASTRODUODENOSCOPY (EGD) WITH PROPOFOL;  Surgeon: Lollie Sails, MD;  Location: [facility] ENDOSCOPY;  Service: Endoscopy;  Laterality:  N/A;  . ESOPHAGOGASTRODUODENOSCOPY (EGD) WITH PROPOFOL N/A 01/08/2018   Procedure: ESOPHAGOGASTRODUODENOSCOPY (EGD) WITH PROPOFOL;  Surgeon: [place], [name], MD;  Location: [facility] ENDOSCOPY;  Service: Gastroenterology;  Laterality: N/A;  . EYE SURGERY Bilateral    Cataract Extraction with IOL  . FOOT SURGERY Right   . INCISION AND DRAINAGE ABSCESS Right 11/20/2016   Procedure: INCISION AND DRAINAGE ABSCESS GLUTEAL;  Surgeon: Christene Lye, MD;  Location: [facility] ORS;  Service: General;  Laterality: Right;  . INCISION AND DRAINAGE ABSCESS Right 01/17/2017   Procedure: INCISION AND DRAINAGE GLUTEAL  ABSCESS;  Surgeon: Hessie Knows, MD;  Location: [facility] ORS;  Service: Orthopedics;  Laterality: Right;  . IRRIGATION AND DEBRIDEMENT BUTTOCKS Right 10/21/2015   Procedure: DRAINAGE GLUTEAL ABSCESS;  Surgeon: Christene Lye, MD;  Location: [facility] ORS;  Service: General;  Laterality: Right;  . KNEE ARTHROPLASTY Left 01/10/2018   Procedure: COMPUTER ASSISTED TOTAL KNEE ARTHROPLASTY;  Surgeon: Dereck Leep, MD;  Location: [facility] ORS;  Service: Orthopedics;  Laterality: Left;  . KNEE ARTHROSCOPY Right    x2  . MANDIBLE SURGERY Bilateral    x2  . PICC LINE INSERTION Right 01/08/2017  . SHOULDER ARTHROSCOPY WITH ROTATOR CUFF REPAIR AND SUBACROMIAL DECOMPRESSION Right 05/25/2015   Procedure: SHOULDER ARTHROSCOPY WITH ROTATOR CUFF REPAIR AND SUBACROMIAL DECOMPRESSION, release long head biceps tendon;  Surgeon: Leanor Kail, MD;  Location: [facility] ORS;  Service: Orthopedics;  Laterality: Right;  . TRANSURETHRAL RESECTION OF BLADDER TUMOR N/A 09/29/2014   Procedure: TRANSURETHRAL RESECTION OF BLADDER TUMOR (TURBT);  Surgeon: Hollice Espy, MD;  Location: [facility] ORS;  Service: Urology;  Laterality: N/A;  . WRIST FRACTURE SURGERY Left     Family History  Problem Relation Age of Onset  . Heart attack Father   . Emphysema Father   . Heart disease Mother   . Hypertension Mother   . Diabetes type II Sister   . Kidney disease Neg Hx   . Bladder Cancer Neg Hx   . Breast cancer Neg Hx     Social History Social History   Tobacco Use  . Smoking status: Never Smoker  . Smokeless tobacco: Never Used  Substance Use Topics  . Alcohol use: No  . Drug use: No     No Known Allergies  Current Outpatient Medications  Medication Sig Dispense Refill  . calcium-vitamin D (OSCAL WITH D) 500-200 MG-UNIT tablet Take 1 tablet by mouth daily.    . carvedilol (COREG) 12.5 MG tablet Take 12.5 mg by mouth 2 (two) times daily with a meal.    . diltiazem (CARDIZEM CD) 240 MG 24 hr capsule Take 240 mg by mouth  daily.    [name] latanoprost (XALATAN) 0.005 % ophthalmic solution Place 1 drop into both eyes at bedtime.    . pravastatin (PRAVACHOL) 40 MG tablet Take 40 mg by mouth at bedtime.     . raloxifene (EVISTA) 60 MG tablet Take 60 mg by mouth daily.    . vitamin B-12 (CYANOCOBALAMIN) 1000 MCG tablet Take 2,000 mcg by mouth daily.    . Cholecalciferol 2000 units TABS Take 2,000 Units by mouth daily.    [name] enoxaparin (LOVENOX) 40 MG/0.4ML injection Inject 0.4 mLs (40 mg total) into the skin daily for 14 days. 14 Syringe 0  . oxyCODONE (OXY IR/ROXICODONE) 5 MG immediate release tablet Take 1 tablet (5 mg total) by mouth every 4 (four) hours as needed for moderate Hahn (Hahn score 4-6). (Patient not taking: Reported on 04/08/2018) 30 tablet 0  .  traMADol (ULTRAM) 50 MG tablet Take 1-2 tablets (50-100 mg total) by mouth every 6 (six) hours as needed for moderate Hahn. (Patient not taking: Reported on 04/08/2018) 60 tablet 0   No current facility-administered medications for this visit.       REVIEW OF SYSTEMS (Negative unless checked)  Constitutional: [] Weight loss  [] Fever  [] Chills Cardiac: [] Chest Hahn   [] Chest pressure   [x] Palpitations   [] Shortness of breath when laying flat   [] Shortness of breath at rest   [x] Shortness of breath with exertion. Vascular:  [] Hahn in legs with walking   [] Hahn in legs at rest   [] Hahn in legs when laying flat   [] Claudication   [] Hahn in feet when walking  [] Hahn in feet at rest  [] Hahn in feet when laying flat   [] History of DVT   [] Phlebitis   [x] Swelling in legs   [x] Varicose veins   [] Non-healing ulcers Pulmonary:   [] Uses home oxygen   [] Productive cough   [] Hemoptysis   [] Wheeze  [] COPD   [] Asthma Neurologic:  [] Dizziness  [] Blackouts   [] Seizures   [] History of stroke   [] History of TIA  [] Aphasia   [] Temporary blindness   [] Dysphagia   [] Weakness or numbness in arms   [] Weakness or numbness in legs Musculoskeletal:  [x] Arthritis   [] Joint swelling   [x] Joint  Hahn   [] Low back Hahn Hematologic:  [] Easy bruising  [] Easy bleeding   [] Hypercoagulable state   [] Anemic  [] Hepatitis Gastrointestinal:  [] Blood in stool   [] Vomiting blood  [] Gastroesophageal reflux/heartburn   [] Abdominal Hahn Genitourinary:  [] Chronic kidney disease   [] Difficult urination  [] Frequent urination  [] Burning with urination   [] Hematuria Skin:  [] Rashes   [] Ulcers   [] Wounds Psychological:  [] History of anxiety   []  History of major depression.    Physical Exam BP (!) 181/79 (BP Location: Left Arm)   Pulse (!) 58   Resp 16   Ht 5\' 1"  (1.549 m)   Wt 153 lb 9.6 oz (69.7 kg)   BMI 29.02 kg/m  Gen:  WD/WN, NAD.  Appears younger than stated age Head: [place]/AT, No temporalis wasting.  Ear/Nose/Throat: Hearing grossly intact, dentition good Eyes: Sclera non-icteric. Conjunctiva clear Neck: Supple. Trachea midline Pulmonary:  Good air movement, no use of accessory muscles, respirations not labored.  Cardiac: RRR, No JVD Vascular: Varicosities diffuse and measuring up to 3 mm in the right lower extremity        Varicosities extensive and measuring up to 3-4 mm in the left lower extremity Vessel Right Left  Radial Palpable Palpable                          PT  1+ palpable  1+ palpable  DP Palpable Palpable   Gastrointestinal: soft, non-tender/non-distended.  Musculoskeletal: M/S 5/5 throughout.   1 + RLE edema.  1-2 + LLE edema Neurologic: Sensation grossly intact in extremities.  Symmetrical.  Speech is fluent.  Psychiatric: Judgment intact, Mood & affect appropriate for pt's clinical situation. Dermatologic: No rashes or ulcers noted.  No cellulitis or open wounds.    Radiology Mm 3d Screen Breast Bilateral  Result Date: 03/10/2018 CLINICAL DATA:  Screening. EXAM: DIGITAL SCREENING BILATERAL MAMMOGRAM WITH TOMO AND CAD COMPARISON:  Previous exam(s). ACR Breast Density Category c: The breast tissue is heterogeneously dense, which may obscure small masses.  FINDINGS: There are no findings suspicious for malignancy. Images were processed with CAD. IMPRESSION: No mammographic evidence of  malignancy. A result letter of this screening mammogram will be mailed directly to the patient. RECOMMENDATION: Screening mammogram in one year. (Code:SM-B-01Y) BI-RADS CATEGORY  1: Negative. Electronically Signed   By: Franki Cabot M.D.   On: 03/10/2018 10:36    Labs Recent Results (from the past 2160 hour(s))  Type and screen [place]     Status: None   Collection Time: 01/10/18 10:04 AM  Result Value Ref Range   ABO/RH(D) A POS    Antibody Screen NEG    Sample Expiration      01/13/2018 Performed at [facility], [address] [place], [place] [address]   Sedimentation rate     Status: Abnormal   Collection Time: 01/10/18 10:04 AM  Result Value Ref Range   Sed Rate 33 (H) 0 - 30 mm/hr    Comment: Performed at [facility], [address] [place],  [address]    Assessment/Plan:  S/P total knee arthroplasty Left leg is little more swollen and painful.  Edema Venous disease likely playing a major contributing factor.  Compression stockings, elevation, and increasing activity would be of benefit.  BP (high blood pressure) blood pressure control important in reducing the progression of atherosclerotic disease. On appropriate oral medications.   Varicose veins of leg with swelling, bilateral   The patient has symptoms consistent with chronic venous insufficiency. We discussed the natural history and treatment options for venous disease. I recommended the regular use of 20 - 30 mm Hg compression stockings, and prescribed these today. I recommended leg elevation and anti-inflammatories as needed for Hahn. I have also recommended a complete venous duplex to assess the venous system for reflux or thrombotic issues. This can be done at the patient's convenience. I will see the patient back in 3 months to  assess the response to conservative management, and determine further treatment options.     [name] 04/08/2018, 11:35 AM   This note was created with Dragon medical transcription system.  Any errors from dictation are unintentional.

## 2018-04-08 NOTE — Assessment & Plan Note (Signed)
Venous disease likely playing a major contributing factor.  Compression stockings, elevation, and increasing activity would be of benefit.

## 2018-04-12 NOTE — Progress Notes (Signed)
[place]  [phone]  [phone]     [name] DOB: Feb 22, 1933  MR#: [id number]  VEL#:[id number]  Patient Care Team: Kirk Ruths, MD as PCP - General (Internal Medicine) Hollice Espy, MD as Consulting Physician (Urology) Kirk Ruths, MD (Internal Medicine) Christene Lye, MD (General Surgery) Hooten, Laurice Record, MD (Orthopedic Surgery)  CHIEF COMPLAINT:  Iron deficiency anemia, unspecified. MGUS.  INTERVAL HISTORY: Patient returns to clinic today for repeat laboratory can further evaluation.  She currently feels well and is asymptomatic.  She does not complain of back pain or joint pain today.  She has no neurologic complaints. She denies any recent fevers or illnesses. She has no chest pain or shortness of breath.  She denies any nausea, vomiting, constipation, or diarrhea. She has noted no melena or hematochezia. She has no urinary complaints.  Patient feels at her baseline offers no specific complaints today.  REVIEW OF SYSTEMS:   Review of Systems  Constitutional: Negative.  Negative for fever, malaise/fatigue and weight loss.  HENT: Negative.  Negative for congestion and sinus pain.   Respiratory: Negative.  Negative for cough and shortness of breath.   Cardiovascular: Negative.  Negative for chest pain and leg swelling.  Gastrointestinal: Negative.  Negative for abdominal pain, blood in stool and melena.  Genitourinary: Negative.  Negative for dysuria.  Musculoskeletal: Negative.  Negative for back pain and joint pain.  Skin: Negative.  Negative for rash.  Neurological: Negative.  Negative for dizziness, tingling, weakness and headaches.  Psychiatric/Behavioral: Negative.  The patient is not nervous/anxious and does not have insomnia.     As per HPI. Otherwise, a complete review of systems is negative.   PAST MEDICAL HISTORY: Past Medical History:  Diagnosis Date  . Anemia   . Anemia   . Bladder  cancer ([place])   . Cancer [facility]) 2016   bladder tumor  . Carotid arterial disease ([place]) 01/09/2014   Overview:  Minimal on screening   . Chronic low back pain   . Colon polyp   . DDD (degenerative disc disease), cervical   . DDD (degenerative disc disease), lumbar   . Elevated lipids   . Erosive esophagitis   . Foot pain   . GERD (gastroesophageal reflux disease)   . Glaucoma   . H/O concussion 04/2012   Due to fall at [facility]  . Heart murmur   . History of bone density study   . History of chickenpox   . HOH (hard of hearing)    Bilateral  Hearing Aids  . Hypertension   . Lower back pain   . Lower extremity edema   . Multinodular thyroid   . Multiple gastric ulcers   . Osteoporosis   . Personal history of chemotherapy    bladder  . 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[place])   . Thyroid disease     PAST SURGICAL HISTORY: Past Surgical History:  Procedure Laterality Date  . ABDOMINAL HYSTERECTOMY  1973  . APPLICATION OF WOUND VAC Right 01/17/2017   Procedure: APPLICATION OF WOUND VAC;  Surgeon: Hessie Knows, MD;  Location: [facility] ORS;  Service: Orthopedics;  Laterality: Right;  . BACK SURGERY     03/1975-ruptured disk, 11/1983-ruptured disk, 11/1984-ruptured disk, 10/1996-ruptured disk, 08/1997-spinal fusion, 02/10/07-spinal fusion [facility]  . BACK SURGERY     Spinal Fusion X 2  . CARPAL TUNNEL RELEASE Right   . CATARACT EXTRACTION Bilateral   . CYSTOSCOPY W/ RETROGRADES Bilateral 09/29/2014   Procedure:  CYSTOSCOPY WITH RETROGRADE PYELOGRAM;  Surgeon: Hollice Espy, MD;  Location: [facility] ORS;  Service: Urology;  Laterality: Bilateral;  . CYSTOSCOPY WITH BIOPSY N/A 11/02/2014   Procedure: CYSTOSCOPY WITH BIOPSY/WITH MITOMYCIN;  Surgeon: Hollice Espy, MD;  Location: [facility] ORS;  Service: Urology;  Laterality: N/A;  . ESOPHAGOGASTRODUODENOSCOPY (EGD) WITH PROPOFOL N/A 05/10/2015   Procedure: ESOPHAGOGASTRODUODENOSCOPY (EGD) WITH PROPOFOL;  Surgeon: Lollie Sails, MD;  Location: [facility]  ENDOSCOPY;  Service: Endoscopy;  Laterality: N/A;  . ESOPHAGOGASTRODUODENOSCOPY (EGD) WITH PROPOFOL N/A 01/08/2018   Procedure: ESOPHAGOGASTRODUODENOSCOPY (EGD) WITH PROPOFOL;  Surgeon: Toledo, [name], MD;  Location: [facility] ENDOSCOPY;  Service: Gastroenterology;  Laterality: N/A;  . EYE SURGERY Bilateral    Cataract Extraction with IOL  . FOOT SURGERY Right   . INCISION AND DRAINAGE ABSCESS Right 11/20/2016   Procedure: INCISION AND DRAINAGE ABSCESS GLUTEAL;  Surgeon: Christene Lye, MD;  Location: [facility] ORS;  Service: General;  Laterality: Right;  . INCISION AND DRAINAGE ABSCESS Right 01/17/2017   Procedure: INCISION AND DRAINAGE GLUTEAL ABSCESS;  Surgeon: Hessie Knows, MD;  Location: [facility] ORS;  Service: Orthopedics;  Laterality: Right;  . IRRIGATION AND DEBRIDEMENT BUTTOCKS Right 10/21/2015   Procedure: DRAINAGE GLUTEAL ABSCESS;  Surgeon: Christene Lye, MD;  Location: [facility] ORS;  Service: General;  Laterality: Right;  . KNEE ARTHROPLASTY Left 01/10/2018   Procedure: COMPUTER ASSISTED TOTAL KNEE ARTHROPLASTY;  Surgeon: Dereck Leep, MD;  Location: [facility] ORS;  Service: Orthopedics;  Laterality: Left;  . KNEE ARTHROSCOPY Right    x2  . MANDIBLE SURGERY Bilateral    x2  . PICC LINE INSERTION Right 01/08/2017  . SHOULDER ARTHROSCOPY WITH ROTATOR CUFF REPAIR AND SUBACROMIAL DECOMPRESSION Right 05/25/2015   Procedure: SHOULDER ARTHROSCOPY WITH ROTATOR CUFF REPAIR AND SUBACROMIAL DECOMPRESSION, release long head biceps tendon;  Surgeon: Leanor Kail, MD;  Location: [facility] ORS;  Service: Orthopedics;  Laterality: Right;  . TRANSURETHRAL RESECTION OF BLADDER TUMOR N/A 09/29/2014   Procedure: TRANSURETHRAL RESECTION OF BLADDER TUMOR (TURBT);  Surgeon: Hollice Espy, MD;  Location: [facility] ORS;  Service: Urology;  Laterality: N/A;  . WRIST FRACTURE SURGERY Left     FAMILY HISTORY Family History  Problem Relation Age of Onset  . Heart attack Father   . Emphysema Father   . Heart disease  Mother   . Hypertension Mother   . Diabetes type II Sister   . Kidney disease Neg Hx   . Bladder Cancer Neg Hx   . Breast cancer Neg Hx     GYNECOLOGIC HISTORY:  No LMP recorded. Patient has had a hysterectomy.     ADVANCED DIRECTIVES:    HEALTH MAINTENANCE: Social History   Tobacco Use  . Smoking status: Never Smoker  . Smokeless tobacco: Never Used  Substance Use Topics  . Alcohol use: No  . Drug use: No   No Known Allergies  Current Outpatient Medications  Medication Sig Dispense Refill  . calcium-vitamin D (OSCAL WITH D) 500-200 MG-UNIT tablet Take 1 tablet by mouth daily.    . carvedilol (COREG) 12.5 MG tablet Take 12.5 mg by mouth 2 (two) times daily with a meal.    . Cholecalciferol 2000 units TABS Take 2,000 Units by mouth daily.    [name] diltiazem (CARDIZEM CD) 240 MG 24 hr capsule Take 240 mg by mouth daily.    [name] latanoprost (XALATAN) 0.005 % ophthalmic solution Place 1 drop into both eyes at bedtime.    . pravastatin (PRAVACHOL) 40 MG tablet Take 40 mg by mouth at bedtime.     [name]  raloxifene (EVISTA) 60 MG tablet Take 60 mg by mouth daily.    . traMADol (ULTRAM) 50 MG tablet Take 1-2 tablets (50-100 mg total) by mouth every 6 (six) hours as needed for moderate pain. 60 tablet 0  . vitamin B-12 (CYANOCOBALAMIN) 1000 MCG tablet Take 2,000 mcg by mouth daily.    [name] enoxaparin (LOVENOX) 40 MG/0.4ML injection Inject 0.4 mLs (40 mg total) into the skin daily for 14 days. 14 Syringe 0  . oxyCODONE (OXY IR/ROXICODONE) 5 MG immediate release tablet Take 1 tablet (5 mg total) by mouth every 4 (four) hours as needed for moderate pain (pain score 4-6). (Patient not taking: Reported on 04/14/2018) 30 tablet 0   No current facility-administered medications for this visit.     OBJECTIVE: BP (!) 176/75 (BP Location: Right Arm, Patient Position: Sitting)   Pulse 60   Temp 97.7 F (36.5 C) (Oral)   Resp 18   Wt 155 lb 9.6 oz (70.6 kg)   BMI 29.40 kg/m    Body mass index is 29.4  kg/m.    ECOG FS:1 - Symptomatic but completely ambulatory  General: Well-developed, well-nourished, no acute distress. Eyes: Pink conjunctiva, anicteric sclera. HEENT: Normocephalic, moist mucous membranes. Lungs: Clear to auscultation bilaterally. Heart: Regular rate and rhythm. No rubs, murmurs, or gallops. Abdomen: Soft, nontender, nondistended. No organomegaly noted, normoactive bowel sounds. Musculoskeletal: No edema, cyanosis, or clubbing. Neuro: Alert, answering all questions appropriately. Cranial nerves grossly intact. Skin: No rashes or petechiae noted. Psych: Normal affect.  LAB RESULTS:  CBC    Component Value Date/Time   WBC 5.2 04/14/2018 1259   RBC 3.66 (L) 04/14/2018 1259   HGB 11.9 (L) 04/14/2018 1259   HGB 8.2 (L) 09/05/2015 0900   HCT 36.9 04/14/2018 1259   HCT 27.6 (L) 09/05/2015 0900   PLT 208 04/14/2018 1259   PLT 389 (H) 09/05/2015 0900   MCV 100.8 (H) 04/14/2018 1259   MCV 83 09/05/2015 0900   MCH 32.5 04/14/2018 1259   MCHC 32.2 04/14/2018 1259   RDW 12.8 04/14/2018 1259   RDW 17.6 (H) 09/05/2015 0900   LYMPHSABS 1.7 04/14/2018 1259   LYMPHSABS 1.8 09/05/2015 0900   MONOABS 0.6 04/14/2018 1259   EOSABS 0.1 04/14/2018 1259   EOSABS 0.1 09/05/2015 0900   BASOSABS 0.0 04/14/2018 1259   BASOSABS 0.0 09/05/2015 0900   Lab Results  Component Value Date   IRON 86 04/14/2018   TIBC 274 04/14/2018   IRONPCTSAT 31 04/14/2018   Lab Results  Component Value Date   FERRITIN 277 04/14/2018   Lab Results  Component Value Date   TOTALPROTELP 6.8 08/14/2017   ALBUMINELP 3.4 08/14/2017   A1GS 0.3 08/14/2017   A2GS 0.8 08/14/2017   BETS 1.0 08/14/2017   GAMS 1.3 08/14/2017   MSPIKE 0.9 (H) 08/14/2017   SPEI Comment 08/14/2017     STUDIES: No results found.  ASSESSMENT: Iron deficiency anemia, MGUS.  PLAN:   1. Iron deficiency anemia: Patient's hemoglobin is 11.9 today which is unchanged and essentially within normal limits.  Iron stores  continue to be within normal limits as well. She does not require additional Feraheme today.  She last received IV Feraheme on May 16, 2017.  Patient has been instructed to continue her oral iron supplementation.  No intervention is needed at this time.  Return to clinic in 6 months with repeat laboratory work and further evaluation.   2. MGUS: Patient is most recent M spike on August 14, 2017 was  reported 0.9, today's result is pending. This appears to be approximately her baseline ranging from 0.7-1.1.  Her IgG levels are normal, but she does have a suppressed IgA and IgM.  Kappa free light chains are also mildly elevated at 95.4.  She has no evidence of endorgan damage.  Repeat laboratory work in 6 months and if everything remains stable, can consider discharging patient to clinic. 3. Superficial bladder cancer: Continue follow-up and treatment per urology. 4.  Elevated ferritin: Resolved.  Patient expressed understanding and was in agreement with this plan. She also understands that She can call clinic at any time with any questions, concerns, or complaints.    Lloyd Huger, MD 04/15/18 6:42 AM

## 2018-04-14 ENCOUNTER — Inpatient Hospital Stay: Payer: Medicare Other

## 2018-04-14 ENCOUNTER — Inpatient Hospital Stay: Payer: Medicare Other | Attending: Oncology

## 2018-04-14 ENCOUNTER — Inpatient Hospital Stay: Payer: Medicare Other | Admitting: Oncology

## 2018-04-14 ENCOUNTER — Other Ambulatory Visit: Payer: Self-pay

## 2018-04-14 VITALS — BP 176/75 | HR 60 | Temp 97.7°F | Resp 18 | Wt 155.6 lb

## 2018-04-14 DIAGNOSIS — I10 Essential (primary) hypertension: Secondary | ICD-10-CM

## 2018-04-14 DIAGNOSIS — D509 Iron deficiency anemia, unspecified: Secondary | ICD-10-CM

## 2018-04-14 DIAGNOSIS — G8929 Other chronic pain: Secondary | ICD-10-CM | POA: Diagnosis not present

## 2018-04-14 DIAGNOSIS — D472 Monoclonal gammopathy: Secondary | ICD-10-CM | POA: Diagnosis not present

## 2018-04-14 DIAGNOSIS — Z8551 Personal history of malignant neoplasm of bladder: Secondary | ICD-10-CM | POA: Insufficient documentation

## 2018-04-14 DIAGNOSIS — K219 Gastro-esophageal reflux disease without esophagitis: Secondary | ICD-10-CM | POA: Diagnosis not present

## 2018-04-14 DIAGNOSIS — M5030 Other cervical disc degeneration, unspecified cervical region: Secondary | ICD-10-CM | POA: Diagnosis not present

## 2018-04-14 DIAGNOSIS — Z79899 Other long term (current) drug therapy: Secondary | ICD-10-CM | POA: Diagnosis not present

## 2018-04-14 DIAGNOSIS — M5136 Other intervertebral disc degeneration, lumbar region: Secondary | ICD-10-CM | POA: Insufficient documentation

## 2018-04-14 DIAGNOSIS — H409 Unspecified glaucoma: Secondary | ICD-10-CM | POA: Diagnosis not present

## 2018-04-14 LAB — IRON AND TIBC
Iron: 86 ug/dL (ref 28–170)
SATURATION RATIOS: 31 % (ref 10.4–31.8)
TIBC: 274 ug/dL (ref 250–450)
UIBC: 188 ug/dL

## 2018-04-14 LAB — CBC WITH DIFFERENTIAL/PLATELET
ABS IMMATURE GRANULOCYTES: 0.01 10*3/uL (ref 0.00–0.07)
Basophils Absolute: 0 10*3/uL (ref 0.0–0.1)
Basophils Relative: 0 %
EOS ABS: 0.1 10*3/uL (ref 0.0–0.5)
EOS PCT: 2 %
HEMATOCRIT: 36.9 % (ref 36.0–46.0)
HEMOGLOBIN: 11.9 g/dL — AB (ref 12.0–15.0)
IMMATURE GRANULOCYTES: 0 %
LYMPHS PCT: 33 %
Lymphs Abs: 1.7 10*3/uL (ref 0.7–4.0)
MCH: 32.5 pg (ref 26.0–34.0)
MCHC: 32.2 g/dL (ref 30.0–36.0)
MCV: 100.8 fL — AB (ref 80.0–100.0)
Monocytes Absolute: 0.6 10*3/uL (ref 0.1–1.0)
Monocytes Relative: 11 %
NRBC: 0 % (ref 0.0–0.2)
Neutro Abs: 2.8 10*3/uL (ref 1.7–7.7)
Neutrophils Relative %: 54 %
Platelets: 208 10*3/uL (ref 150–400)
RBC: 3.66 MIL/uL — ABNORMAL LOW (ref 3.87–5.11)
RDW: 12.8 % (ref 11.5–15.5)
WBC: 5.2 10*3/uL (ref 4.0–10.5)

## 2018-04-14 LAB — FERRITIN: FERRITIN: 277 ng/mL (ref 11–307)

## 2018-04-14 NOTE — Progress Notes (Signed)
Here for follow up. Per pt energy level " good, no sob and no fatigue "

## 2018-04-15 ENCOUNTER — Ambulatory Visit: Payer: Medicare Other | Admitting: Urology

## 2018-04-15 ENCOUNTER — Other Ambulatory Visit: Payer: Self-pay

## 2018-04-15 ENCOUNTER — Encounter: Payer: Self-pay | Admitting: Urology

## 2018-04-15 VITALS — BP 168/91 | HR 87 | Wt 153.0 lb

## 2018-04-15 DIAGNOSIS — Z8551 Personal history of malignant neoplasm of bladder: Secondary | ICD-10-CM

## 2018-04-15 LAB — URINALYSIS, COMPLETE
Bilirubin, UA: NEGATIVE
GLUCOSE, UA: NEGATIVE
KETONES UA: NEGATIVE
Nitrite, UA: NEGATIVE
SPEC GRAV UA: 1.025 (ref 1.005–1.030)
Urobilinogen, Ur: 0.2 mg/dL (ref 0.2–1.0)
pH, UA: 5.5 (ref 5.0–7.5)

## 2018-04-15 LAB — PROTEIN ELECTROPHORESIS, SERUM
A/G Ratio: 1.2 (ref 0.7–1.7)
ALBUMIN ELP: 3.6 g/dL (ref 2.9–4.4)
Alpha-1-Globulin: 0.2 g/dL (ref 0.0–0.4)
Alpha-2-Globulin: 0.7 g/dL (ref 0.4–1.0)
Beta Globulin: 0.9 g/dL (ref 0.7–1.3)
GAMMA GLOBULIN: 1.1 g/dL (ref 0.4–1.8)
Globulin, Total: 2.9 g/dL (ref 2.2–3.9)
M-SPIKE, %: 0.7 g/dL — AB
TOTAL PROTEIN ELP: 6.5 g/dL (ref 6.0–8.5)

## 2018-04-15 LAB — MICROSCOPIC EXAMINATION

## 2018-04-15 LAB — KAPPA/LAMBDA LIGHT CHAINS
KAPPA FREE LGHT CHN: 96.5 mg/L — AB (ref 3.3–19.4)
KAPPA, LAMDA LIGHT CHAIN RATIO: 13.04 — AB (ref 0.26–1.65)
Lambda free light chains: 7.4 mg/L (ref 5.7–26.3)

## 2018-04-15 LAB — IGG, IGA, IGM
IGM (IMMUNOGLOBULIN M), SRM: 31 mg/dL (ref 26–217)
IgA: 19 mg/dL — ABNORMAL LOW (ref 64–422)
IgG (Immunoglobin G), Serum: 1289 mg/dL (ref 700–1600)

## 2018-04-15 NOTE — Progress Notes (Signed)
10:09 AM  04/15/18   [name] 01/29/1933 [id number]  Referring provider: Kirk Ruths, MD [facility] [place], [place] [address]  Chief Complaint  Patient presents with  . Procedure    cystoscopy    HPI: 84 year old nonsmoker with  incidental 11 mm papillary neoplasm near the RIGHT bladder neck. She was taken to the operating room on 09/29/2014 for TURBT, Bilateral retrograde pyelogramat which time a 2 cm extremely spherical mass was identified just proximal to the RIGHT UO. This was resected along with deeper biopsies of the base. Of note, bilateral retrograde pyelogram was negative for any obvious upper tract filling defects or hydronephrosis.   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 The tumor had an inverted architecture. Deeper biopsies did contain muscle after speaking with the pathologist today and the muscle was negative for any tumor. Additonally, CIS was noted.  She returned to the operating room on 11/02/2014 for restaging TURBT. Pathology showed no evidence of residual tumor.  She completed BCG x 6 for induction and first course of maintenence bcg x 3 doses  X 2.  Her last maintainance dose completed in 07/2015.     She developed recurrent gluteal abscess requiring multiple rounds of IV antibiotics and surgical intervention.  After this, additional BCG was deferred.  Most recent cross sectional imaging 10/2016 in the form of CT abd/ pelvis with contrast.  She returns today for surveillance cystoscopy.  She denies dysuria or gross hematuria.  Physical Exam: BP (!) 168/91   Pulse 87   Wt 153 lb (69.4 kg)   BMI 28.91 kg/m   Constitutional:  Alert and oriented, No acute distress. HEENT: [place] AT, moist mucus membranes.  Trachea midline, no masses. Cardiovascular: No clubbing, cyanosis, or edema. Respiratory: Normal respiratory effort, no increased work of  breathing. GI: Abdomen is soft, nontender, nondistended, no abdominal masses  GU:  Normal external genitalia. Normal urethral meatus. Skin: No rashes, bruises or suspicious lesions. Neurologic: Grossly intact, no focal deficits, moving all 4 extremities. Psychiatric: Normal mood and affect.   Urinalysis UA reviewed, no evidence of infection   Cystoscopy Procedure Note  Patient identification was confirmed, informed consent was obtained, and patient was prepped using Betadine solution.  Lidocaine jelly was administered per urethral meatus.    Preoperative abx where received prior to procedure.    Procedure: - Flexible cystoscope introduced, without any difficulty.   - Thorough search of the bladder revealed:    normal urethral meatus    normal urothelium    no stones    no ulcers     no tumors    no urethral polyps    no trabeculation  - Ureteral orifices were normal in position and appearance.  Post-Procedure: - Patient tolerated the procedure well  Assessment & Plan:  84 year old female with a history of high-grade T1, Tis of the right bladder neck dx 09/2014 followed by induction BCG x 6, and most recently  maintenance BCG completed 04/2015 and 07/2015.  She returns today for surveillance cystoscopy.   1. Malignant neoplasm of bladder neck Cystoscopy today negative  Cytology sent today Agreed to continue to abstain from BCG in the absence of recurrence and above history of recurrent gluteal abscess formation Continue q6 cystoscopy  Return in about 6 months (around 10/14/2018) for cysto.     Hollice Espy, MD  [facility] [address], [place], [place] [address] [phone]  [phone]  

## 2018-04-18 ENCOUNTER — Other Ambulatory Visit: Payer: Self-pay | Admitting: Urology

## 2018-05-11 NOTE — Discharge Instructions (Signed)
°  Instructions after Total Knee Replacement ° ° Oniel Meleski P. Rivers Gassmann, Jr., M.D.    ° Dept. of Orthopaedics & Sports Medicine ° [facility] ° [address] ° [place], [place]  [address] ° Phone: [phone]   Fax: [phone] ° °  °DIET: °• Drink plenty of non-alcoholic fluids. °• Resume your normal diet. Include foods high in fiber. ° °ACTIVITY:  °• You may use crutches or a walker with weight-bearing as tolerated, unless instructed otherwise. °• You may be weaned off of the walker or crutches by your Physical Therapist.  °• Do NOT place pillows under the knee. Anything placed under the knee could limit your ability to straighten the knee.   °• Continue doing gentle exercises. Exercising will reduce the pain and swelling, increase motion, and prevent muscle weakness.   °• Please continue to use the TED compression stockings for 6 weeks. You may remove the stockings at night, but should reapply them in the morning. °• Do not drive or operate any equipment until instructed. ° °WOUND CARE:  °• Continue to use the PolarCare or ice packs periodically to reduce pain and swelling. °• You may bathe or shower after the staples are removed at the first office visit following surgery. ° °MEDICATIONS: °• You may resume your regular medications. °• Please take the pain medication as prescribed on the medication. °• Do not take pain medication on an empty stomach. °• You have been given a prescription for a blood thinner (Lovenox or Coumadin). Please take the medication as instructed. (NOTE: After completing a 2 week course of Lovenox, take one Enteric-coated aspirin once a day. This along with elevation will help reduce the possibility of phlebitis in your operated leg.) °• Do not drive or drink alcoholic beverages when taking pain medications. ° °CALL THE OFFICE FOR: °• Temperature above 101 degrees °• Excessive bleeding or drainage on the dressing. °• Excessive swelling, coldness, or paleness of the toes. °• Persistent  nausea and vomiting. ° °FOLLOW-UP:  °• You should have an appointment to return to the office in 10-14 days after surgery. °• Arrangements have been made for continuation of Physical Therapy (either home therapy or outpatient therapy). °  °

## 2018-06-03 ENCOUNTER — Other Ambulatory Visit: Payer: Self-pay

## 2018-06-03 ENCOUNTER — Encounter
Admission: RE | Admit: 2018-06-03 | Discharge: 2018-06-03 | Disposition: A | Discharge status: Home / Self Care | Payer: Medicare Other | Source: Ambulatory Visit | Attending: Orthopedic Surgery | Admitting: Orthopedic Surgery

## 2018-06-03 DIAGNOSIS — Z01812 Encounter for preprocedural laboratory examination: Secondary | ICD-10-CM | POA: Diagnosis present

## 2018-06-03 LAB — SURGICAL PCR SCREEN
MRSA, PCR: NEGATIVE
Staphylococcus aureus: NEGATIVE

## 2018-06-03 LAB — COMPREHENSIVE METABOLIC PANEL
ALT: 34 U/L (ref 0–44)
AST: 58 U/L — ABNORMAL HIGH (ref 15–41)
Albumin: 3.8 g/dL (ref 3.5–5.0)
Alkaline Phosphatase: 98 U/L (ref 38–126)
Anion gap: 7 (ref 5–15)
BILIRUBIN TOTAL: 1.6 mg/dL — AB (ref 0.3–1.2)
BUN: 25 mg/dL — ABNORMAL HIGH (ref 8–23)
CO2: 25 mmol/L (ref 22–32)
Calcium: 9.3 mg/dL (ref 8.9–10.3)
Chloride: 107 mmol/L (ref 98–111)
Creatinine, Ser: 0.9 mg/dL (ref 0.44–1.00)
GFR calc Af Amer: >60 mL/min (ref >60)
GFR calc non Af Amer: 59 mL/min — ABNORMAL LOW (ref >60)
Glucose, Bld: 100 mg/dL — ABNORMAL HIGH (ref 70–99)
Potassium: 3.9 mmol/L (ref 3.5–5.1)
Sodium: 139 mmol/L (ref 135–145)
TOTAL PROTEIN: 7.5 g/dL (ref 6.5–8.1)

## 2018-06-03 LAB — URINALYSIS, ROUTINE W REFLEX MICROSCOPIC
Bilirubin Urine: NEGATIVE
Glucose, UA: NEGATIVE mg/dL
Hgb urine dipstick: NEGATIVE
KETONES UR: NEGATIVE mg/dL
Nitrite: NEGATIVE
Protein, ur: 30 mg/dL — AB
Specific Gravity, Urine: 1.02 (ref 1.005–1.030)
pH: 6 (ref 5.0–8.0)

## 2018-06-03 LAB — CBC
HEMATOCRIT: 38.6 % (ref 36.0–46.0)
Hemoglobin: 12.3 g/dL (ref 12.0–15.0)
MCH: 33.1 pg (ref 26.0–34.0)
MCHC: 31.9 g/dL (ref 30.0–36.0)
MCV: 103.8 fL — ABNORMAL HIGH (ref 80.0–100.0)
Platelets: 232 10*3/uL (ref 150–400)
RBC: 3.72 MIL/uL — ABNORMAL LOW (ref 3.87–5.11)
RDW: 12 % (ref 11.5–15.5)
WBC: 5.3 10*3/uL (ref 4.0–10.5)
nRBC: 0 % (ref 0.0–0.2)

## 2018-06-03 LAB — SEDIMENTATION RATE: Sed Rate: 14 mm/hr (ref 0–30)

## 2018-06-03 LAB — APTT: aPTT: 34 seconds (ref 24–36)

## 2018-06-03 LAB — PROTIME-INR
INR: 1.1
Prothrombin Time: 14.1 seconds (ref 11.4–15.2)

## 2018-06-03 LAB — C-REACTIVE PROTEIN: CRP: <0.8 mg/dL (ref <1.0)

## 2018-06-03 NOTE — Pre-Procedure Instructions (Signed)
LABS FAXED TO DR HOOTEN 

## 2018-06-03 NOTE — Patient Instructions (Signed)
Your procedure is scheduled on: Monday 06/16/2018 Report to [facility]. To find out your arrival time please call [phone] between 1PM - 3PM on Friday 06/13/2018.  Remember: Instructions that are not followed completely may result in serious medical risk, up to and including death, or upon the discretion of your surgeon and anesthesiologist your surgery may need to be rescheduled.     _X__ 1. Do not eat food after midnight the night before your procedure.                 No gum chewing or hard candies. You may drink clear liquids up to 2 hours                 before you are scheduled to arrive for your surgery- DO not drink clear                 liquids within 2 hours of the start of your surgery.                 Clear Liquids include:  water, apple juice without pulp, clear carbohydrate                 drink such as Clearfast or Gatorade, Black Coffee or Tea (Do not add                 anything to coffee or tea).  __X__2.  On the morning of surgery brush your teeth with toothpaste and water, you                 may rinse your mouth with mouthwash if you wish.  Do not swallow any              toothpaste of mouthwash.     _X__ 3.  No Alcohol for 24 hours before or after surgery.   _X__ 4.  Do Not Smoke or use e-cigarettes For 24 Hours Prior to Your Surgery.                 Do not use any chewable tobacco products for at least 6 hours prior to                 surgery.  ____  5.  Bring all medications with you on the day of surgery if instructed.   __X__  6.  Notify your doctor if there is any change in your medical condition      (cold, fever, infections).     Do not wear jewelry, make-up, hairpins, clips or nail polish. Do not wear lotions, powders, or perfumes.  Do not shave 48 hours prior to surgery. Men may shave face and neck. Do not bring valuables to the hospital.    [facility] is not responsible for any belongings or  valuables.  Contacts, dentures/partials or body piercings may not be worn into surgery. Bring a case for your contacts, glasses or hearing aids, a denture cup will be supplied. Leave your suitcase in the car. After surgery it may be brought to your room. For patients admitted to the hospital, discharge time is determined by your treatment team.   Patients discharged the day of surgery will not be allowed to drive home.   Please read over the following fact sheets that you were given:   MRSA Information  __X__ Take these medicines the morning of surgery with A SIP OF WATER:  1. carvedilol (COREG)  2. diltiazem (CARDIZEM   3.   4.  5.  6.  ____ Fleet Enema (as directed)   __X__ Use CHG Soap/SAGE wipes as directed  ____ Use inhalers on the day of surgery  ____ Stop metformin/Janumet/Farxiga 2 days prior to surgery    ____ Take 1/2 of usual insulin dose the night before surgery. No insulin the morning          of surgery.   ____ Stop Blood Thinners Coumadin/Plavix/Xarelto/Pleta/Pradaxa/Eliquis/Effient/Aspirin  on   Or contact your Surgeon, Cardiologist or Medical Doctor regarding  ability to stop your blood thinners  __X__ Stop Anti-inflammatories 7 days before surgery such as Advil, Ibuprofen, Motrin,  BC or Goodies Powder, Naprosyn, Naproxen, Aleve, Aspirin   You may take tylenol or acetaminophen if needed for aches or pain  __X__ Stop all herbal supplements, fish oil or vitamin E until after surgery.  Calcium, Vitamin d and b12 are OK to continue  ____ Bring C-Pap to the hospital.

## 2018-06-05 LAB — URINE CULTURE
Culture: 80000 — AB
SPECIAL REQUESTS: NORMAL

## 2018-06-05 NOTE — Pre-Procedure Instructions (Signed)
UC FAXED TO DR HOOTEN 

## 2018-06-15 MED ORDER — TRANEXAMIC ACID-NACL 1000-0.7 MG/100ML-% IV SOLN
1000.0000 mg | INTRAVENOUS | Status: AC
  Administered 2018-06-16: 1000 mg via INTRAVENOUS
  Filled 2018-06-15: qty 100

## 2018-06-15 MED ORDER — CEFAZOLIN SODIUM-DEXTROSE 2-4 GM/100ML-% IV SOLN
2.0000 g | INTRAVENOUS | Status: AC
  Administered 2018-06-16: 2 g via INTRAVENOUS

## 2018-06-16 ENCOUNTER — Encounter
Admission: RE | Disposition: A | Discharge status: Home Health | Payer: Self-pay | Source: Home / Self Care | Attending: Orthopedic Surgery

## 2018-06-16 ENCOUNTER — Inpatient Hospital Stay
Admission: RE | Admit: 2018-06-16 | Discharge: 2018-06-18 | DRG: 470 | Disposition: A | Discharge status: Home Health | Payer: Medicare Other | Attending: Orthopedic Surgery | Admitting: Orthopedic Surgery

## 2018-06-16 ENCOUNTER — Inpatient Hospital Stay: Payer: Medicare Other

## 2018-06-16 ENCOUNTER — Inpatient Hospital Stay: Payer: Medicare Other | Admitting: Anesthesiology

## 2018-06-16 ENCOUNTER — Encounter: Payer: Self-pay | Admitting: Orthopedic Surgery

## 2018-06-16 ENCOUNTER — Other Ambulatory Visit: Payer: Self-pay

## 2018-06-16 DIAGNOSIS — K219 Gastro-esophageal reflux disease without esophagitis: Secondary | ICD-10-CM | POA: Diagnosis present

## 2018-06-16 DIAGNOSIS — Z8551 Personal history of malignant neoplasm of bladder: Secondary | ICD-10-CM

## 2018-06-16 DIAGNOSIS — M1711 Unilateral primary osteoarthritis, right knee: Principal | ICD-10-CM | POA: Diagnosis present

## 2018-06-16 DIAGNOSIS — Z96652 Presence of left artificial knee joint: Secondary | ICD-10-CM | POA: Diagnosis present

## 2018-06-16 DIAGNOSIS — M810 Age-related osteoporosis without current pathological fracture: Secondary | ICD-10-CM | POA: Diagnosis present

## 2018-06-16 DIAGNOSIS — H409 Unspecified glaucoma: Secondary | ICD-10-CM | POA: Diagnosis present

## 2018-06-16 DIAGNOSIS — N183 Chronic kidney disease, stage 3 (moderate): Secondary | ICD-10-CM | POA: Diagnosis present

## 2018-06-16 DIAGNOSIS — I129 Hypertensive chronic kidney disease with stage 1 through stage 4 chronic kidney disease, or unspecified chronic kidney disease: Secondary | ICD-10-CM | POA: Diagnosis present

## 2018-06-16 DIAGNOSIS — H9190 Unspecified hearing loss, unspecified ear: Secondary | ICD-10-CM | POA: Diagnosis present

## 2018-06-16 DIAGNOSIS — Z96659 Presence of unspecified artificial knee joint: Secondary | ICD-10-CM

## 2018-06-16 HISTORY — PX: KNEE ARTHROPLASTY: SHX992

## 2018-06-16 SURGERY — ARTHROPLASTY, KNEE, TOTAL, USING IMAGELESS COMPUTER-ASSISTED NAVIGATION
Anesthesia: General | Site: Knee | Laterality: Right

## 2018-06-16 MED ORDER — LIDOCAINE HCL (PF) 2 % IJ SOLN
INTRAMUSCULAR | Status: AC
  Filled 2018-06-16: qty 10

## 2018-06-16 MED ORDER — FAMOTIDINE 20 MG PO TABS
ORAL_TABLET | ORAL | Status: AC
  Administered 2018-06-16: 20 mg via ORAL
  Filled 2018-06-16: qty 1

## 2018-06-16 MED ORDER — GABAPENTIN 300 MG PO CAPS
ORAL_CAPSULE | ORAL | Status: AC
  Administered 2018-06-16: 300 mg via ORAL
  Filled 2018-06-16: qty 1

## 2018-06-16 MED ORDER — ALUM & MAG HYDROXIDE-SIMETH 200-200-20 MG/5ML PO SUSP: 30.0000 mL | ORAL | Status: DC | PRN

## 2018-06-16 MED ORDER — GABAPENTIN 300 MG PO CAPS
300.0000 mg | ORAL_CAPSULE | Freq: Once | ORAL | Status: AC
  Administered 2018-06-16: 300 mg via ORAL

## 2018-06-16 MED ORDER — CHLORHEXIDINE GLUCONATE 4 % EX LIQD: 60.0000 mL | Freq: Once | CUTANEOUS | Status: DC

## 2018-06-16 MED ORDER — CEFAZOLIN SODIUM-DEXTROSE 2-4 GM/100ML-% IV SOLN
INTRAVENOUS | Status: AC
  Filled 2018-06-16: qty 100

## 2018-06-16 MED ORDER — ROCURONIUM BROMIDE 50 MG/5ML IV SOLN
INTRAVENOUS | Status: AC
  Filled 2018-06-16: qty 1

## 2018-06-16 MED ORDER — PROPOFOL 10 MG/ML IV BOLUS
INTRAVENOUS | Status: AC
  Filled 2018-06-16: qty 20

## 2018-06-16 MED ORDER — METOCLOPRAMIDE HCL 10 MG PO TABS
10.0000 mg | ORAL_TABLET | Freq: Three times a day (TID) | ORAL | Status: DC
  Administered 2018-06-16 – 2018-06-17 (×5): 10 mg via ORAL
  Filled 2018-06-16 (×6): qty 1

## 2018-06-16 MED ORDER — GABAPENTIN 300 MG PO CAPS
300.0000 mg | ORAL_CAPSULE | Freq: Every day | ORAL | Status: DC
  Administered 2018-06-16 – 2018-06-17 (×2): 300 mg via ORAL
  Filled 2018-06-16 (×2): qty 1

## 2018-06-16 MED ORDER — OXYCODONE HCL 5 MG/5ML PO SOLN: 5.0000 mg | Freq: Once | ORAL | Status: DC | PRN

## 2018-06-16 MED ORDER — FENTANYL CITRATE (PF) 100 MCG/2ML IJ SOLN
25.0000 ug | INTRAMUSCULAR | Status: DC | PRN
  Administered 2018-06-16: 50 ug via INTRAVENOUS

## 2018-06-16 MED ORDER — LACTATED RINGERS IV SOLN
INTRAVENOUS | Status: DC
  Administered 2018-06-16 (×2): via INTRAVENOUS

## 2018-06-16 MED ORDER — ENOXAPARIN SODIUM 30 MG/0.3ML ~~LOC~~ SOLN
30.0000 mg | Freq: Two times a day (BID) | SUBCUTANEOUS | Status: DC
  Administered 2018-06-17 – 2018-06-18 (×3): 30 mg via SUBCUTANEOUS
  Filled 2018-06-16 (×3): qty 0.3

## 2018-06-16 MED ORDER — HYDROMORPHONE HCL 1 MG/ML IJ SOLN: 0.5000 mg | INTRAMUSCULAR | Status: DC | PRN

## 2018-06-16 MED ORDER — TRAMADOL HCL 50 MG PO TABS: 50.0000 mg | ORAL_TABLET | ORAL | Status: DC | PRN

## 2018-06-16 MED ORDER — FENTANYL CITRATE (PF) 100 MCG/2ML IJ SOLN
INTRAMUSCULAR | Status: DC | PRN
  Administered 2018-06-16: 100 ug via INTRAVENOUS
  Administered 2018-06-16: 50 ug via INTRAVENOUS
  Administered 2018-06-16 (×2): 100 ug via INTRAVENOUS

## 2018-06-16 MED ORDER — GENTAMICIN SULFATE 40 MG/ML IJ SOLN
INTRAMUSCULAR | Status: AC
  Filled 2018-06-16: qty 8

## 2018-06-16 MED ORDER — CEFAZOLIN SODIUM-DEXTROSE 2-4 GM/100ML-% IV SOLN
2.0000 g | Freq: Four times a day (QID) | INTRAVENOUS | Status: AC
  Administered 2018-06-16 – 2018-06-17 (×4): 2 g via INTRAVENOUS
  Filled 2018-06-16 (×5): qty 100

## 2018-06-16 MED ORDER — LATANOPROST 0.005 % OP SOLN
1.0000 [drp] | Freq: Every day | OPHTHALMIC | Status: DC
  Administered 2018-06-16: 1 [drp] via OPHTHALMIC
  Filled 2018-06-16: qty 2.5

## 2018-06-16 MED ORDER — OXYCODONE HCL 5 MG PO TABS: 10.0000 mg | ORAL_TABLET | ORAL | Status: DC | PRN

## 2018-06-16 MED ORDER — PRAVASTATIN SODIUM 20 MG PO TABS
40.0000 mg | ORAL_TABLET | Freq: Every day | ORAL | Status: DC
  Administered 2018-06-16 – 2018-06-17 (×2): 40 mg via ORAL
  Filled 2018-06-16 (×2): qty 2

## 2018-06-16 MED ORDER — SODIUM CHLORIDE 0.9 % IV SOLN
INTRAVENOUS | Status: DC | PRN
  Administered 2018-06-16: 60 mL

## 2018-06-16 MED ORDER — CALCIUM CARBONATE-VITAMIN D 500-200 MG-UNIT PO TABS
1.0000 | ORAL_TABLET | Freq: Every day | ORAL | Status: DC
  Administered 2018-06-16 – 2018-06-18 (×3): 1 via ORAL
  Filled 2018-06-16 (×3): qty 1

## 2018-06-16 MED ORDER — FENTANYL CITRATE (PF) 250 MCG/5ML IJ SOLN
INTRAMUSCULAR | Status: AC
  Filled 2018-06-16: qty 5

## 2018-06-16 MED ORDER — FENTANYL CITRATE (PF) 100 MCG/2ML IJ SOLN
INTRAMUSCULAR | Status: AC
  Filled 2018-06-16: qty 2

## 2018-06-16 MED ORDER — CELECOXIB 200 MG PO CAPS
ORAL_CAPSULE | ORAL | Status: AC
  Administered 2018-06-16: 400 mg via ORAL
  Filled 2018-06-16: qty 2

## 2018-06-16 MED ORDER — ONDANSETRON HCL 4 MG/2ML IJ SOLN: 4.0000 mg | Freq: Four times a day (QID) | INTRAMUSCULAR | Status: DC | PRN

## 2018-06-16 MED ORDER — SODIUM CHLORIDE 0.9 % IV SOLN
INTRAVENOUS | Status: DC | PRN
  Administered 2018-06-16: 1000 mL

## 2018-06-16 MED ORDER — BUPIVACAINE HCL (PF) 0.25 % IJ SOLN
INTRAMUSCULAR | Status: AC
  Filled 2018-06-16: qty 30

## 2018-06-16 MED ORDER — DILTIAZEM HCL ER COATED BEADS 240 MG PO CP24
240.0000 mg | ORAL_CAPSULE | Freq: Every day | ORAL | Status: DC
  Administered 2018-06-18: 240 mg via ORAL
  Filled 2018-06-16: qty 1

## 2018-06-16 MED ORDER — MENTHOL 3 MG MT LOZG
1.0000 | LOZENGE | OROMUCOSAL | Status: DC | PRN
  Filled 2018-06-16: qty 9

## 2018-06-16 MED ORDER — SUGAMMADEX SODIUM 200 MG/2ML IV SOLN
INTRAVENOUS | Status: DC | PRN
  Administered 2018-06-16: 200 mg via INTRAVENOUS

## 2018-06-16 MED ORDER — MAGNESIUM HYDROXIDE 400 MG/5ML PO SUSP
30.0000 mL | Freq: Every day | ORAL | Status: DC
  Administered 2018-06-16 – 2018-06-17 (×2): 30 mL via ORAL
  Filled 2018-06-16 (×3): qty 30

## 2018-06-16 MED ORDER — CARVEDILOL 12.5 MG PO TABS
12.5000 mg | ORAL_TABLET | Freq: Two times a day (BID) | ORAL | Status: DC
  Administered 2018-06-16 – 2018-06-18 (×2): 12.5 mg via ORAL
  Filled 2018-06-16 (×2): qty 1

## 2018-06-16 MED ORDER — BISACODYL 10 MG RE SUPP: 10.0000 mg | Freq: Every day | RECTAL | Status: DC | PRN

## 2018-06-16 MED ORDER — EPHEDRINE SULFATE 50 MG/ML IJ SOLN
INTRAMUSCULAR | Status: AC
  Filled 2018-06-16: qty 1

## 2018-06-16 MED ORDER — ROCURONIUM BROMIDE 100 MG/10ML IV SOLN
INTRAVENOUS | Status: DC | PRN
  Administered 2018-06-16: 80 mg via INTRAVENOUS

## 2018-06-16 MED ORDER — ACETAMINOPHEN 325 MG PO TABS: 325.0000 mg | ORAL_TABLET | Freq: Four times a day (QID) | ORAL | Status: DC | PRN

## 2018-06-16 MED ORDER — DEXAMETHASONE SODIUM PHOSPHATE 10 MG/ML IJ SOLN
8.0000 mg | Freq: Once | INTRAMUSCULAR | Status: AC
  Administered 2018-06-16: 8 mg via INTRAVENOUS

## 2018-06-16 MED ORDER — TRANEXAMIC ACID-NACL 1000-0.7 MG/100ML-% IV SOLN
1000.0000 mg | Freq: Once | INTRAVENOUS | Status: AC
  Administered 2018-06-16: 1000 mg via INTRAVENOUS
  Filled 2018-06-16: qty 100

## 2018-06-16 MED ORDER — DIPHENHYDRAMINE HCL 12.5 MG/5ML PO ELIX: 12.5000 mg | ORAL_SOLUTION | ORAL | Status: DC | PRN

## 2018-06-16 MED ORDER — BUPIVACAINE HCL (PF) 0.25 % IJ SOLN
INTRAMUSCULAR | Status: DC | PRN
  Administered 2018-06-16: 30 mL

## 2018-06-16 MED ORDER — RALOXIFENE HCL 60 MG PO TABS
60.0000 mg | ORAL_TABLET | Freq: Every day | ORAL | Status: DC
  Administered 2018-06-16 – 2018-06-18 (×3): 60 mg via ORAL
  Filled 2018-06-16 (×4): qty 1

## 2018-06-16 MED ORDER — PROPOFOL 10 MG/ML IV BOLUS
INTRAVENOUS | Status: DC | PRN
  Administered 2018-06-16: 130 mg via INTRAVENOUS

## 2018-06-16 MED ORDER — SENNOSIDES-DOCUSATE SODIUM 8.6-50 MG PO TABS
1.0000 | ORAL_TABLET | Freq: Two times a day (BID) | ORAL | Status: DC
  Administered 2018-06-16 – 2018-06-17 (×3): 1 via ORAL
  Filled 2018-06-16 (×4): qty 1

## 2018-06-16 MED ORDER — SUGAMMADEX SODIUM 200 MG/2ML IV SOLN
INTRAVENOUS | Status: AC
  Filled 2018-06-16: qty 2

## 2018-06-16 MED ORDER — LIDOCAINE IN D5W 4-5 MG/ML-% IV SOLN
INTRAVENOUS | Status: DC | PRN
  Administered 2018-06-16: 25 ug/kg/min via INTRAVENOUS

## 2018-06-16 MED ORDER — VITAMIN D 25 MCG (1000 UNIT) PO TABS
1000.0000 [IU] | ORAL_TABLET | Freq: Every day | ORAL | Status: DC
  Administered 2018-06-16 – 2018-06-18 (×3): 1000 [IU] via ORAL
  Filled 2018-06-16 (×3): qty 1

## 2018-06-16 MED ORDER — ACETAMINOPHEN 10 MG/ML IV SOLN
INTRAVENOUS | Status: AC
  Filled 2018-06-16: qty 100

## 2018-06-16 MED ORDER — DEXAMETHASONE SODIUM PHOSPHATE 10 MG/ML IJ SOLN
INTRAMUSCULAR | Status: AC
  Administered 2018-06-16: 8 mg via INTRAVENOUS
  Filled 2018-06-16: qty 1

## 2018-06-16 MED ORDER — GLYCOPYRROLATE 0.2 MG/ML IJ SOLN
INTRAMUSCULAR | Status: DC | PRN
  Administered 2018-06-16: 0.2 mg via INTRAVENOUS

## 2018-06-16 MED ORDER — PANTOPRAZOLE SODIUM 40 MG PO TBEC
40.0000 mg | DELAYED_RELEASE_TABLET | Freq: Two times a day (BID) | ORAL | Status: DC
  Administered 2018-06-16 – 2018-06-18 (×4): 40 mg via ORAL
  Filled 2018-06-16 (×4): qty 1

## 2018-06-16 MED ORDER — ONDANSETRON HCL 4 MG PO TABS: 4.0000 mg | ORAL_TABLET | Freq: Four times a day (QID) | ORAL | Status: DC | PRN

## 2018-06-16 MED ORDER — SODIUM CHLORIDE (PF) 0.9 % IJ SOLN
INTRAMUSCULAR | Status: AC
  Filled 2018-06-16: qty 50

## 2018-06-16 MED ORDER — METOCLOPRAMIDE HCL 10 MG PO TABS: 5.0000 mg | ORAL_TABLET | Freq: Three times a day (TID) | ORAL | Status: DC | PRN

## 2018-06-16 MED ORDER — BUPIVACAINE LIPOSOME 1.3 % IJ SUSP
INTRAMUSCULAR | Status: AC
  Filled 2018-06-16: qty 20

## 2018-06-16 MED ORDER — FAMOTIDINE 20 MG PO TABS
20.0000 mg | ORAL_TABLET | Freq: Once | ORAL | Status: AC
  Administered 2018-06-16: 20 mg via ORAL

## 2018-06-16 MED ORDER — METOCLOPRAMIDE HCL 5 MG/ML IJ SOLN: 5.0000 mg | Freq: Three times a day (TID) | INTRAMUSCULAR | Status: DC | PRN

## 2018-06-16 MED ORDER — PHENOL 1.4 % MT LIQD
1.0000 | OROMUCOSAL | Status: DC | PRN
  Filled 2018-06-16: qty 177

## 2018-06-16 MED ORDER — LIDOCAINE HCL (CARDIAC) PF 100 MG/5ML IV SOSY
PREFILLED_SYRINGE | INTRAVENOUS | Status: DC | PRN
  Administered 2018-06-16: 60 mg via INTRAVENOUS
  Administered 2018-06-16: 100 mg via INTRAVENOUS

## 2018-06-16 MED ORDER — VITAMIN B-12 1000 MCG PO TABS
2000.0000 ug | ORAL_TABLET | Freq: Every day | ORAL | Status: DC
  Administered 2018-06-16 – 2018-06-18 (×3): 2000 ug via ORAL
  Filled 2018-06-16 (×3): qty 2

## 2018-06-16 MED ORDER — OXYCODONE HCL 5 MG PO TABS: 5.0000 mg | ORAL_TABLET | ORAL | Status: DC | PRN

## 2018-06-16 MED ORDER — CELECOXIB 200 MG PO CAPS
400.0000 mg | ORAL_CAPSULE | Freq: Once | ORAL | Status: AC
  Administered 2018-06-16: 400 mg via ORAL

## 2018-06-16 MED ORDER — CELECOXIB 200 MG PO CAPS
200.0000 mg | ORAL_CAPSULE | Freq: Two times a day (BID) | ORAL | Status: DC
  Administered 2018-06-16 – 2018-06-18 (×4): 200 mg via ORAL
  Filled 2018-06-16 (×4): qty 1

## 2018-06-16 MED ORDER — ACETAMINOPHEN 10 MG/ML IV SOLN
1000.0000 mg | Freq: Four times a day (QID) | INTRAVENOUS | Status: AC
  Administered 2018-06-16 – 2018-06-17 (×4): 1000 mg via INTRAVENOUS
  Filled 2018-06-16 (×5): qty 100

## 2018-06-16 MED ORDER — SODIUM CHLORIDE 0.9 % IV SOLN
INTRAVENOUS | Status: DC
  Administered 2018-06-16: 19:00:00 via INTRAVENOUS

## 2018-06-16 MED ORDER — OXYCODONE HCL 5 MG PO TABS: 5.0000 mg | ORAL_TABLET | Freq: Once | ORAL | Status: DC | PRN

## 2018-06-16 MED ORDER — FERROUS SULFATE 325 (65 FE) MG PO TABS
325.0000 mg | ORAL_TABLET | Freq: Two times a day (BID) | ORAL | Status: DC
  Administered 2018-06-16 – 2018-06-18 (×4): 325 mg via ORAL
  Filled 2018-06-16 (×4): qty 1

## 2018-06-16 MED ORDER — FLEET ENEMA 7-19 GM/118ML RE ENEM: 1.0000 | ENEMA | Freq: Once | RECTAL | Status: DC | PRN

## 2018-06-16 SURGICAL SUPPLY — 67 items
ATTUNE PS FEM RT SZ 3 CEM KNEE (Femur) ×2 IMPLANT
ATTUNE PSRP INSR SZ3 6 KNEE (Insert) ×1 IMPLANT
ATTUNE PSRP INSR SZ3 6MM KNEE (Insert) ×1 IMPLANT
BASEPLATE TIBIAL ROTATING SZ 4 (Knees) ×2 IMPLANT
BATTERY INSTRU NAVIGATION (MISCELLANEOUS) ×12 IMPLANT
BLADE SAW 70X12.5 (BLADE) ×3 IMPLANT
BLADE SAW 90X13X1.19 OSCILLAT (BLADE) ×3 IMPLANT
BLADE SAW 90X25X1.19 OSCILLAT (BLADE) ×3 IMPLANT
BSPLAT TIB 4 CMNT ROT PLAT STR (Knees) ×1 IMPLANT
BTRY SRG DRVR LF (MISCELLANEOUS) ×4
CANISTER SUCT 1200ML W/VALVE (MISCELLANEOUS) ×3 IMPLANT
CANISTER SUCT 3000ML PPV (MISCELLANEOUS) ×6 IMPLANT
CEMENT HV SMART SET (Cement) ×6 IMPLANT
COOLER POLAR GLACIER W/PUMP (MISCELLANEOUS) ×3 IMPLANT
COVER WAND RF STERILE (DRAPES) ×3 IMPLANT
CUFF TOURN 24 STER (MISCELLANEOUS) ×2 IMPLANT
CUFF TOURN 30 STER DUAL PORT (MISCELLANEOUS) IMPLANT
DRAPE SHEET LG 3/4 BI-LAMINATE (DRAPES) ×3 IMPLANT
DRSG DERMACEA 8X12 NADH (GAUZE/BANDAGES/DRESSINGS) ×3 IMPLANT
DRSG OPSITE POSTOP 4X14 (GAUZE/BANDAGES/DRESSINGS) ×3 IMPLANT
DRSG TEGADERM 4X4.75 (GAUZE/BANDAGES/DRESSINGS) ×3 IMPLANT
DURAPREP 26ML APPLICATOR (WOUND CARE) ×6 IMPLANT
ELECT CAUTERY BLADE 6.4 (BLADE) ×3 IMPLANT
ELECT REM PT RETURN 9FT ADLT (ELECTROSURGICAL) ×3
ELECTRODE REM PT RTRN 9FT ADLT (ELECTROSURGICAL) ×1 IMPLANT
EX-PIN ORTHOLOCK NAV 4X150 (PIN) ×6 IMPLANT
GLOVE BIOGEL M STRL SZ7.5 (GLOVE) ×6 IMPLANT
GLOVE INDICATOR 8.0 STRL GRN (GLOVE) ×3 IMPLANT
GOWN STRL REUS W/ TWL LRG LVL3 (GOWN DISPOSABLE) ×2 IMPLANT
GOWN STRL REUS W/TWL LRG LVL3 (GOWN DISPOSABLE) ×6
HEMOVAC 400CC 10FR (MISCELLANEOUS) ×3 IMPLANT
HOLDER FOLEY CATH W/STRAP (MISCELLANEOUS) ×3 IMPLANT
HOOD PEEL AWAY FLYTE STAYCOOL (MISCELLANEOUS) ×8 IMPLANT
KIT TURNOVER KIT A (KITS) ×3 IMPLANT
KNIFE SCULPS 14X20 (INSTRUMENTS) ×3 IMPLANT
LABEL OR SOLS (LABEL) ×3 IMPLANT
NDL SAFETY ECLIPSE 18X1.5 (NEEDLE) ×1 IMPLANT
NDL SPNL 20GX3.5 QUINCKE YW (NEEDLE) ×2 IMPLANT
NEEDLE HYPO 18GX1.5 SHARP (NEEDLE) ×3
NEEDLE SPNL 20GX3.5 QUINCKE YW (NEEDLE) ×6 IMPLANT
NS IRRIG 500ML POUR BTL (IV SOLUTION) ×3 IMPLANT
PACK TOTAL KNEE (MISCELLANEOUS) ×3 IMPLANT
PAD WRAPON POLAR KNEE (MISCELLANEOUS) ×1 IMPLANT
PATELLA MEDIAL ATTUN 35MM KNEE (Knees) ×2 IMPLANT
PENCIL SMOKE ULTRAEVAC 22 CON (MISCELLANEOUS) ×3 IMPLANT
PIN FIXATION 1/8DIA X 3INL (PIN) ×9 IMPLANT
PULSAVAC PLUS IRRIG FAN TIP (DISPOSABLE) ×3
SOL .9 NS 3000ML IRR  AL (IV SOLUTION) ×2
SOL .9 NS 3000ML IRR AL (IV SOLUTION) ×1
SOL .9 NS 3000ML IRR UROMATIC (IV SOLUTION) ×1 IMPLANT
SOL PREP PVP 2OZ (MISCELLANEOUS) ×3
SOLUTION PREP PVP 2OZ (MISCELLANEOUS) ×1 IMPLANT
SPONGE DRAIN TRACH 4X4 STRL 2S (GAUZE/BANDAGES/DRESSINGS) ×3 IMPLANT
STAPLER SKIN PROX 35W (STAPLE) ×3 IMPLANT
STRAP TIBIA SHORT (MISCELLANEOUS) ×3 IMPLANT
SUCTION FRAZIER HANDLE 10FR (MISCELLANEOUS) ×2
SUCTION TUBE FRAZIER 10FR DISP (MISCELLANEOUS) ×1 IMPLANT
SUT VIC AB 0 CT1 36 (SUTURE) ×7 IMPLANT
SUT VIC AB 1 CT1 36 (SUTURE) ×6 IMPLANT
SUT VIC AB 2-0 CT2 27 (SUTURE) ×3 IMPLANT
SYR 20CC LL (SYRINGE) ×3 IMPLANT
SYR 30ML LL (SYRINGE) ×6 IMPLANT
TIP FAN IRRIG PULSAVAC PLUS (DISPOSABLE) ×1 IMPLANT
TOWEL OR 17X26 4PK STRL BLUE (TOWEL DISPOSABLE) ×3 IMPLANT
TOWER CARTRIDGE SMART MIX (DISPOSABLE) ×3 IMPLANT
TRAY FOLEY MTR SLVR 16FR STAT (SET/KITS/TRAYS/PACK) ×3 IMPLANT
WRAPON POLAR PAD KNEE (MISCELLANEOUS) ×3

## 2018-06-16 NOTE — Anesthesia Preprocedure Evaluation (Signed)
Anesthesia Evaluation  Patient identified by MRN, date of birth, ID band Patient awake    Reviewed: Allergy & Precautions, H&P , NPO status , Patient's Chart, lab work & pertinent test results  History of Anesthesia Complications Negative for: history of anesthetic complications  Airway Mallampati: III  TM Distance: <3 FB Neck ROM: limited    Dental  (+) Chipped, Caps   Pulmonary neg pulmonary ROS, neg shortness of breath,           Cardiovascular Exercise Tolerance: Good hypertension, (-) angina(-) Past MI and (-) DOE      Neuro/Psych  Neuromuscular disease negative psych ROS   GI/Hepatic Neg liver ROS, PUD, GERD  Medicated and Controlled,  Endo/Other  negative endocrine ROS  Renal/GU Renal disease     Musculoskeletal  (+) Arthritis ,   Abdominal   Peds  Hematology negative hematology ROS (+)   Anesthesia Other Findings Past Medical History: No date: Anemia No date: Anemia No date: Bladder cancer ([place]) 2016: Cancer ([place])     Comment:  bladder tumor 01/09/2014: Carotid arterial disease ([facility])     Comment:  Overview:  Minimal on screening  No date: Chronic low back pain No date: Colon polyp No date: DDD (degenerative disc disease), cervical No date: DDD (degenerative disc disease), lumbar No date: Elevated lipids No date: Erosive esophagitis No date: Foot pain No date: GERD (gastroesophageal reflux disease) No date: Glaucoma 04/2012: H/O concussion     Comment:  Due to fall at [facility] No date: Heart murmur No date: History of bone density study No date: History of chickenpox No date: HOH (hard of hearing)     Comment:  Bilateral  Hearing Aids No date: Hypertension No date: Lower back pain No date: Lower extremity edema No date: Multinodular thyroid No date: Multiple gastric ulcers No date: Osteoporosis No date: Personal history of chemotherapy     Comment:  bladder No date:  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[place]) No date: Thyroid disease  Past Surgical History: 1973: ABDOMINAL HYSTERECTOMY 2/94/7654: APPLICATION OF WOUND VAC; Right     Comment:  Procedure: APPLICATION OF WOUND VAC;  Surgeon: Hessie Knows, MD;  Location: [facility] ORS;  Service: Orthopedics;               Laterality: Right; No date: BACK SURGERY     Comment:  03/1975-ruptured disk, 11/1983-ruptured disk,               11/1984-ruptured disk, 10/1996-ruptured disk,               08/1997-spinal fusion, 02/10/07-spinal fusion [facility] No date: BACK SURGERY     Comment:  Spinal Fusion X 2 No date: CARPAL TUNNEL RELEASE; Right No date: CATARACT EXTRACTION; Bilateral 09/29/2014: CYSTOSCOPY W/ RETROGRADES; Bilateral     Comment:  Procedure: CYSTOSCOPY WITH RETROGRADE PYELOGRAM;                Surgeon: Hollice Espy, MD;  Location: [facility] ORS;                Service: Urology;  Laterality: Bilateral; 11/02/2014: CYSTOSCOPY WITH BIOPSY; N/A     Comment:  Procedure: CYSTOSCOPY WITH BIOPSY/WITH MITOMYCIN;                Surgeon: Hollice Espy, MD;  Location: [facility] ORS;                Service: Urology;  Laterality:  N/A; 05/10/2015: ESOPHAGOGASTRODUODENOSCOPY (EGD) WITH PROPOFOL; N/A     Comment:  Procedure: ESOPHAGOGASTRODUODENOSCOPY (EGD) WITH               PROPOFOL;  Surgeon: Lollie Sails, MD;  Location:               [facility] ENDOSCOPY;  Service: Endoscopy;  Laterality: N/A; 01/08/2018: ESOPHAGOGASTRODUODENOSCOPY (EGD) WITH PROPOFOL; N/A     Comment:  Procedure: ESOPHAGOGASTRODUODENOSCOPY (EGD) WITH               PROPOFOL;  Surgeon: Tol[place], [name], MD;  Location:               [facility] ENDOSCOPY;  Service: Gastroenterology;  Laterality:               N/A; No date: EYE SURGERY; Bilateral     Comment:  Cataract Extraction with IOL No date: FOOT SURGERY; Right 11/20/2016: INCISION AND DRAINAGE ABSCESS; Right     Comment:  Procedure: INCISION AND DRAINAGE ABSCESS GLUTEAL;                Surgeon: Christene Lye, MD;  Location: [facility] ORS;              Service: General;  Laterality: Right; 01/17/2017: INCISION AND DRAINAGE ABSCESS; Right     Comment:  Procedure: INCISION AND DRAINAGE GLUTEAL ABSCESS;                Surgeon: Hessie Knows, MD;  Location: [facility] ORS;                Service: Orthopedics;  Laterality: Right; 10/21/2015: IRRIGATION AND DEBRIDEMENT BUTTOCKS; Right     Comment:  Procedure: DRAINAGE GLUTEAL ABSCESS;  Surgeon:               Christene Lye, MD;  Location: [facility] ORS;  Service:              General;  Laterality: Right; 01/10/2018: KNEE ARTHROPLASTY; Left     Comment:  Procedure: COMPUTER ASSISTED TOTAL KNEE ARTHROPLASTY;                Surgeon: Dereck Leep, MD;  Location: [facility] ORS;                Service: Orthopedics;  Laterality: Left; No date: KNEE ARTHROSCOPY; Right     Comment:  x2 No date: MANDIBLE SURGERY; Bilateral     Comment:  x2 01/08/2017: PICC LINE INSERTION; Right 05/25/2015: SHOULDER ARTHROSCOPY WITH ROTATOR CUFF REPAIR AND  SUBACROMIAL DECOMPRESSION; Right     Comment:  Procedure: SHOULDER ARTHROSCOPY WITH ROTATOR CUFF REPAIR              AND SUBACROMIAL DECOMPRESSION, release long head biceps               tendon;  Surgeon: Leanor Kail, MD;  Location: [facility]               ORS;  Service: Orthopedics;  Laterality: Right; 09/29/2014: TRANSURETHRAL RESECTION OF BLADDER TUMOR; N/A     Comment:  Procedure: TRANSURETHRAL RESECTION OF BLADDER TUMOR               (TURBT);  Surgeon: Hollice Espy, MD;  Location: [facility]               ORS;  Service: Urology;  Laterality: N/A; No date: WRIST FRACTURE SURGERY; Left  BMI    Body Mass Index:  29.08 kg/m  Reproductive/Obstetrics negative OB ROS                             Anesthesia Physical Anesthesia Plan  ASA: III  Anesthesia Plan: General ETT   Post-op Pain Management:    Induction: Intravenous  PONV Risk Score and Plan: Ondansetron, Dexamethasone, Midazolam and  Treatment may vary due to age or medical condition  Airway Management Planned: Oral ETT  Additional Equipment:   Intra-op Plan:   Post-operative Plan: Extubation in OR  Informed Consent: I have reviewed the patients History and Physical, chart, labs and discussed the procedure including the risks, benefits and alternatives for the proposed anesthesia with the patient or authorized representative who has indicated his/her understanding and acceptance.     Dental Advisory Given  Plan Discussed with: Anesthesiologist, CRNA and Surgeon  Anesthesia Plan Comments: (History of patient with multiple lumbar back surgeries, spinal hardware infection and failed spinal so plan for GA.   Patient consented for risks of anesthesia including but not limited to:  - adverse reactions to medications - damage to teeth, lips or other oral mucosa - sore throat or hoarseness - Damage to heart, brain, lungs or loss of life  Patient voiced understanding.)        Anesthesia Quick Evaluation

## 2018-06-16 NOTE — Op Note (Signed)
OPERATIVE NOTE  DATE OF SURGERY:  06/16/2018  PATIENT NAME:  [name]   DOB: 03-15-1933  MRN: [id number]  PRE-OPERATIVE DIAGNOSIS: Degenerative arthrosis of the right knee, primary  POST-OPERATIVE DIAGNOSIS:  Same  PROCEDURE:  Right total knee arthroplasty using computer-assisted navigation  SURGEON:  Marciano Sequin. M.D.  ASSISTANT:  Liliane Bade, PA-C (present and scrubbed throughout the case, critical for assistance with exposure, retraction, instrumentation, and closure)  ANESTHESIA: general  ESTIMATED BLOOD LOSS: 20 mL  FLUIDS REPLACED: 1000 mL of crystalloid  TOURNIQUET TIME: 86 minutes  DRAINS: 2 medium Hemovac drains  SOFT TISSUE RELEASES: Anterior cruciate ligament, posterior cruciate ligament, deep medial collateral ligament, patellofemoral ligament  IMPLANTS UTILIZED: DePuy Attune size 3 posterior stabilized femoral component (cemented), size 4 rotating platform tibial component (cemented), 35 mm medialized dome patella (cemented), and a 6 mm stabilized rotating platform polyethylene insert.  INDICATIONS FOR SURGERY: [name] is a 85 y.o. year old female with a long history of progressive knee pain. X-rays demonstrated severe degenerative changes in tricompartmental fashion. The patient had not seen any significant improvement despite conservative nonsurgical intervention. After discussion of the risks and benefits of surgical intervention, the patient expressed understanding of the risks benefits and agree with plans for total knee arthroplasty.   The risks, benefits, and alternatives were discussed at length including but not limited to the risks of infection, bleeding, nerve injury, stiffness, blood clots, the need for revision surgery, cardiopulmonary complications, among others, and they were willing to proceed.  PROCEDURE IN DETAIL: The patient was brought into the operating room and, after adequate general anesthesia was achieved, a  tourniquet was placed on the patient's upper thigh. The patient's knee and leg were cleaned and prepped with alcohol and DuraPrep and draped in the usual sterile fashion. A "timeout" was performed as per usual protocol. The lower extremity was exsanguinated using an Esmarch, and the tourniquet was inflated to 300 mmHg. An anterior longitudinal incision was made followed by a standard mid vastus approach. The deep fibers of the medial collateral ligament were elevated in a subperiosteal fashion off of the medial flare of the tibia so as to maintain a continuous soft tissue sleeve. The patella was subluxed laterally and the patellofemoral ligament was incised. Inspection of the knee demonstrated severe degenerative changes with full-thickness loss of articular cartilage. Osteophytes were debrided using a rongeur. Anterior and posterior cruciate ligaments were excised. Two 4.0 mm Schanz pins were inserted in the femur and into the tibia for attachment of the array of trackers used for computer-assisted navigation. Hip center was identified using a circumduction technique. Distal landmarks were mapped using the computer. The distal femur and proximal tibia were mapped using the computer. The distal femoral cutting guide was positioned using computer-assisted navigation so as to achieve a 5 distal valgus cut. The femur was sized and it was felt that a size 3 femoral component was appropriate. A size 3 femoral cutting guide was positioned and the anterior cut was performed and verified using the computer. This was followed by completion of the posterior and chamfer cuts. Femoral cutting guide for the central box was then positioned in the center box cut was performed.  Attention was then directed to the proximal tibia. Medial and lateral menisci were excised. The extramedullary tibial cutting guide was positioned using computer-assisted navigation so as to achieve a 0 varus-valgus alignment and 3 posterior slope. The  cut was performed and verified using the computer. The proximal tibia  was sized and it was felt that a size 4 tibial tray was appropriate. Tibial and femoral trials were inserted followed by insertion of a 6 mm polyethylene insert. This allowed for excellent mediolateral soft tissue balancing both in flexion and in full extension. Finally, the patella was cut and prepared so as to accommodate a 35 mm medialized dome patella. A patella trial was placed and the knee was placed through a range of motion with excellent patellar tracking appreciated. The femoral trial was removed after debridement of posterior osteophytes. The central post-hole for the tibial component was reamed followed by insertion of a keel punch. Tibial trials were then removed. Cut surfaces of bone were irrigated with copious amounts of normal saline with antibiotic solution using pulsatile lavage and then suctioned dry. Polymethylmethacrylate cement was prepared in the usual fashion using a vacuum mixer. Cement was applied to the cut surface of the proximal tibia as well as along the undersurface of a size 4 rotating platform tibial component. Tibial component was positioned and impacted into place. Excess cement was removed using [employer] fast streamer. Cement was then applied to the cut surfaces of the femur as well as along the posterior flanges of the size 3 femoral component. The femoral component was positioned and impacted into place. Excess cement was removed using [employer] fast streamer. A 6 mm polyethylene trial was inserted and the knee was brought into full extension with steady axial compression applied. Finally, cement was applied to the backside of a 35 mm medialized dome patella and the patellar component was positioned and patellar clamp applied. Excess cement was removed using [employer] fast streamer. After adequate curing of the cement, the tourniquet was deflated after a total tourniquet time of 86 minutes. Hemostasis was achieved using  electrocautery. The knee was irrigated with copious amounts of normal saline with antibiotic solution using pulsatile lavage and then suctioned dry. 20 mL of 1.3% Exparel and 60 mL of 0.25% Marcaine in 40 mL of normal saline was injected along the posterior capsule, medial and lateral gutters, and along the arthrotomy site. A 6 mm stabilized rotating platform polyethylene insert was inserted and the knee was placed through a range of motion with excellent mediolateral soft tissue balancing appreciated and excellent patellar tracking noted. 2 medium drains were placed in the wound bed and brought out through separate stab incisions. The medial parapatellar portion of the incision was reapproximated using interrupted sutures of #1 Vicryl. Subcutaneous tissue was approximated in layers using first #0 Vicryl followed #2-0 Vicryl. The skin was approximated with skin staples. A sterile dressing was applied.  The patient tolerated the procedure well and was transported to the recovery room in stable condition.    James P. Holley Bouche., M.D.

## 2018-06-16 NOTE — H&P (Signed)
The patient has been re-examined, and the chart reviewed, and there have been no interval changes to the documented history and physical.    The risks, benefits, and alternatives have been discussed at length. The patient expressed understanding of the risks benefits and agreed with plans for surgical intervention.  Lenford Beddow P. Karlis Cregg, Jr. M.D.    

## 2018-06-16 NOTE — Anesthesia Post-op Follow-up Note (Signed)
Anesthesia QCDR form completed.        

## 2018-06-16 NOTE — Anesthesia Procedure Notes (Signed)
Procedure Name: Intubation Date/Time: 06/16/2018 12:59 PM Performed by: Bernardo Heater, CRNA Pre-anesthesia Checklist: Patient identified, Emergency Drugs available, Suction available and Patient being monitored Patient Re-evaluated:Patient Re-evaluated prior to induction Oxygen Delivery Method: Circle system utilized Preoxygenation: Pre-oxygenation with 100% oxygen Induction Type: IV induction Laryngoscope Size: Mac and 3 Grade View: Grade II Tube size: 7.0 mm Number of attempts: 1 Airway Equipment and Method: Stylet Placement Confirmation: ETT inserted through vocal cords under direct vision,  positive ETCO2 and breath sounds checked- equal and bilateral Secured at: 22 cm Tube secured with: Tape Dental Injury: Teeth and Oropharynx as per pre-operative assessment

## 2018-06-16 NOTE — NC FL2 (Signed)
[place] LEVEL OF CARE SCREENING TOOL     IDENTIFICATION  Patient Name: [name] Birthdate: [birth year] Sex: female Admission Date (Current Location): 06/16/2018  [place] and [place] Number:  Engineering geologist and Address:  [facility], [address], [place], [place] [address]      Provider Number: [id number]  Attending Physician Name and Address:  Dereck Leep, MD  Relative Name and Phone Number:       Current Level of Care: Hospital Recommended Level of Care: [place] Prior Approval Number:    Date Approved/Denied:   PASRR Number: [id number])  Discharge Plan: SNF    Current Diagnoses: Patient Active Problem List   Diagnosis Date Noted  . Total knee replacement status 06/16/2018  . Varicose veins of leg with swelling, bilateral 04/08/2018  . Status post total left knee replacement 01/10/2018  . Monoclonal gammopathy of unknown significance (MGUS) 06/11/2016  . Abscess of gluteal region 02/21/2016  . Psoas abscess ([place]) 02/21/2016  . Malignant neoplasm of urinary bladder ([place]) 08/23/2015  . History of repair of rotator cuff 08/23/2015  . Impingement syndrome of shoulder 03/30/2015  . Impingement syndrome of right shoulder 03/30/2015  . Anemia, iron deficiency 02/02/2015  . IDA (iron deficiency anemia) 01/26/2015  . Malignant neoplasm of bladder neck ([facility]) 12/30/2014  . Colon polyp 12/17/2014  . Inflammation of sacroiliac joint ([place]) 09/22/2014  . Carotid arterial disease ([place]) 01/09/2014  . Combined fat and carbohydrate induced hyperlipemia 01/09/2014  . Accumulation of fluid in tissues 01/09/2014  . Benign hypertension with CKD (chronic kidney disease) stage III ([place]) 01/09/2014  . Arthritis, degenerative 01/09/2014  . OP (osteoporosis) 01/09/2014  . Carotid artery disease ([place]) 01/09/2014  . Edema 01/09/2014  . Cervical osteoarthritis 10/28/2013  . DDD (degenerative disc disease),  cervical 10/28/2013  . Acute blood loss anemia 05/16/2012  . Fracture of parietal bone ([place]) 05/16/2012  . Fall down stairs 05/16/2012  . Hemorrhage into subarachnoid space of neuraxis ([place]) 05/16/2012  . Subdural hematoma ([place]) 05/16/2012  . Fracture of temporal bone ([place]) 05/16/2012  . Traumatic subdural hemorrhage with loss of consciousness ([place]) 05/16/2012    Orientation RESPIRATION BLADDER Height & Weight     Self, Time, Situation, Place  Normal Continent Weight: 153 lb 14.1 oz (69.8 kg) Height:  5\' 1"  (154.9 cm)  BEHAVIORAL SYMPTOMS/MOOD NEUROLOGICAL BOWEL NUTRITION STATUS      Continent Diet(Diet: NPO for surgery to be advanced. )  AMBULATORY STATUS COMMUNICATION OF NEEDS Skin   Extensive Assist Verbally Surgical wounds(Incision: Right Knee. )                       Personal Care Assistance Level of Assistance  Bathing, Feeding, Dressing Bathing Assistance: Limited assistance Feeding assistance: Independent Dressing Assistance: Limited assistance     Functional Limitations Info  Sight, Hearing, Speech Sight Info: Adequate Hearing Info: Adequate Speech Info: Adequate    SPECIAL CARE FACTORS FREQUENCY  PT (By licensed PT), OT (By licensed OT)     PT Frequency: (5) OT Frequency: (5)            Contractures      Additional Factors Info  Code Status, Allergies Code Status Info: (Full Code. ) Allergies Info: (No Known Allergies. )           Current Medications (06/16/2018):  This is the current hospital active medication list Current Facility-Administered Medications  Medication Dose Route Frequency Provider Last Rate  Last Dose  . chlorhexidine (HIBICLENS) 4 % liquid 4 application  60 mL Topical Once Hooten, Laurice Record, MD      . fentaNYL (SUBLIMAZE) 100 MCG/2ML injection           . fentaNYL (SUBLIMAZE) injection 25-50 mcg  25-50 mcg Intravenous Q5 min PRN Piscitello, Precious Haws, MD   50 mcg at 06/16/18 1636  . lactated ringers infusion   Intravenous  Continuous Martha Clan, MD 125 mL/hr at 06/16/18 1030    . oxyCODONE (Oxy IR/ROXICODONE) immediate release tablet 5 mg  5 mg Oral Once PRN Piscitello, Precious Haws, MD       Or  . oxyCODONE (ROXICODONE) 5 MG/5ML solution 5 mg  5 mg Oral Once PRN Piscitello, Precious Haws, MD      . tranexamic acid (CYKLOKAPRON) IVPB 1,000 mg  1,000 mg Intravenous Once Hooten, Laurice Record, MD         Discharge Medications: Please see discharge summary for a list of discharge medications.  Relevant Imaging Results:  Relevant Lab Results:   Additional Information (SSN: [id number])  Autry Prust, Veronia Beets, LCSW

## 2018-06-16 NOTE — Transfer of Care (Signed)
Immediate Anesthesia Transfer of Care Note  Patient: [name]  Procedure(s) Performed: COMPUTER ASSISTED TOTAL KNEE ARTHROPLASTY (Right Knee)  Patient Location: PACU  Anesthesia Type:General  Level of Consciousness: sedated  Airway & Oxygen Therapy: Patient Spontanous Breathing and Patient connected to face mask oxygen  Post-op Assessment: Report given to RN and Post -op Vital signs reviewed and stable  Post vital signs: Reviewed and stable  Last Vitals:  Vitals Value Taken Time  BP    Temp    Pulse 59 06/16/2018  4:21 PM  Resp 12 06/16/2018  4:21 PM  SpO2 100 % 06/16/2018  4:21 PM  Vitals shown include unvalidated device data.  Last Pain:  Vitals:   06/16/18 1003  PainSc: 3          Complications: No apparent anesthesia complications

## 2018-06-17 ENCOUNTER — Encounter: Payer: Self-pay | Admitting: Orthopedic Surgery

## 2018-06-17 LAB — TYPE AND SCREEN
ABO/RH(D): A POS
Antibody Screen: POSITIVE
Unit division: 0
Unit division: 0

## 2018-06-17 LAB — BPAM RBC
BLOOD PRODUCT EXPIRATION DATE: 202002232359
Blood Product Expiration Date: 202002242359
Unit Type and Rh: 5100
Unit Type and Rh: 5100

## 2018-06-17 NOTE — Care Management (Signed)
[name] with [name] has accepted the patient for HH PT 

## 2018-06-17 NOTE — Care Management Note (Signed)
Case Management Note  Patient Details  Name: [name] MRN: [id number] Date of Birth: 1933-05-06  Subjective/Objective:                  Met with the patient to discuss DC needs Patient lives alone Patient has transportation when needed with her son who lives next door [name] will also help as needed Patient provided with [name] list and has asked to use [name] for PT Patient has all DME at home from previous knee surgery and has no other DME needs Patient uses [facility] as pharmacy Patient can afford medications Patient Has [name] as PCP Let [name] know that patient chooses them for [name]   Action/Plan:  [name] list provided to patient per CMS. GOV Notified [name] [name] of Patient choice  Expected Discharge Date:  06/19/18               Expected Discharge Plan:     In-House Referral:     Discharge planning Services     Post Acute Care Choice:    Choice offered to:     DME Arranged:    DME Agency:     HH Arranged:  PT HH Agency:  [place]  Status of Service:  In process, will continue to follow  If discussed at Long Length of Stay Meetings, dates discussed:    Additional Comments:  Elizabeth Hilt, RN 06/17/2018, 10:10 AM

## 2018-06-17 NOTE — Anesthesia Postprocedure Evaluation (Signed)
Anesthesia Post Note  Patient: [name]  Procedure(s) Performed: COMPUTER ASSISTED TOTAL KNEE ARTHROPLASTY (Right Knee)  Patient location during evaluation: PACU Anesthesia Type: General Level of consciousness: awake and alert Pain management: pain level controlled Vital Signs Assessment: post-procedure vital signs reviewed and stable Respiratory status: spontaneous breathing, nonlabored ventilation, respiratory function stable and patient connected to nasal cannula oxygen Cardiovascular status: blood pressure returned to baseline and stable Postop Assessment: no apparent nausea or vomiting Anesthetic complications: no     Last Vitals:  Vitals:   06/16/18 2052 06/17/18 0429  BP: (!) 137/56 134/60  Pulse: (!) 53 60  Resp: 18 18  Temp: (!) 36.4 C (!) 36.4 C  SpO2: 98% 97%    Last Pain:  Vitals:   06/17/18 0429  TempSrc: Oral  PainSc:                  [name]

## 2018-06-17 NOTE — Progress Notes (Signed)
   Subjective: 1 Day Post-Op Procedure(s) (LRB): COMPUTER ASSISTED TOTAL KNEE ARTHROPLASTY (Right) Patient reports pain as 0 on 0-10 scale.   Patient is well, and has had no acute complaints or problems We will start therapy today.  Patient voicing no complaints.  Slept most of the night. Plan is to go Home after hospital stay. no nausea and no vomiting Patient denies any chest pains or shortness of breath. Objective: Vital signs in last 24 hours: Temp:  [97.1 F (36.2 C)-97.7 F (36.5 C)] 97.5 F (36.4 C) (01/21 0429) Pulse Rate:  [47-60] 60 (01/21 0429) Resp:  [10-18] 18 (01/21 0429) BP: (130-151)/(53-64) 134/60 (01/21 0429) SpO2:  [94 %-100 %] 97 % (01/21 0429) Weight:  [69.8 kg] 69.8 kg (01/20 1003) Heels are non tender and elevated off the bed using rolled towels as well as bone foam under operative heel Intake/Output from previous day: 01/20 0701 - 01/21 0700 In: 1703.2 [I.V.:1303.2; IV Piggyback:400] Out: 1670 [Urine:1550; Drains:70; Blood:50] Intake/Output this shift: No intake/output data recorded.  No results for input(s): HGB in the last 72 hours. No results for input(s): WBC, RBC, HCT, PLT in the last 72 hours. No results for input(s): NA, K, CL, CO2, BUN, CREATININE, GLUCOSE, CALCIUM in the last 72 hours. No results for input(s): LABPT, INR in the last 72 hours.  EXAM General - Patient is Alert, Appropriate and Oriented Extremity - Neurologically intact Neurovascular intact Sensation intact distally Intact pulses distally Dorsiflexion/Plantar flexion intact Compartment soft Dressing - dressing C/D/I Motor Function - intact, moving foot and toes well on exam.  Patient able to do straight leg raise on her own  Past Medical History:  Diagnosis Date  . Anemia   . Anemia   . Bladder cancer ([place])   . Cancer [facility]) 2016   bladder tumor  . Carotid arterial disease ([place]) 01/09/2014   Overview:  Minimal on screening   . Chronic low back pain   . Colon polyp    . DDD (degenerative disc disease), cervical   . DDD (degenerative disc disease), lumbar   . Elevated lipids   . Erosive esophagitis   . Foot pain   . GERD (gastroesophageal reflux disease)   . Glaucoma   . H/O concussion 04/2012   Due to fall at [facility]  . Heart murmur   . History of bone density study   . History of chickenpox   . HOH (hard of hearing)    Bilateral  Hearing Aids  . Hypertension   . Lower back pain   . Lower extremity edema   . Multinodular thyroid   . Multiple gastric ulcers   . Osteoporosis   . Personal history of chemotherapy    bladder  . 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 Thyroid disease     Assessment/Plan: 1 Day Post-Op Procedure(s) (LRB): COMPUTER ASSISTED TOTAL KNEE ARTHROPLASTY (Right) Active Problems:   Total knee replacement status  Estimated body mass index is 29.08 kg/m as calculated from the following:   Height as of this encounter: 5\' 1"  (1.549 m).   Weight as of this encounter: 69.8 kg. Advance diet Up with therapy D/C IV fluids Plan for discharge tomorrow Discharge home with home health  Labs: None DVT Prophylaxis - Lovenox, Foot Pumps and TED hose Weight-Bearing as tolerated to right leg D/C O2 and Pulse OX and try on Room Air Begin working on bowel movement  Lynnex Fulp R. [place] 06/17/2018, 7:46 AM

## 2018-06-17 NOTE — Discharge Summary (Signed)
Physician Discharge Summary  Patient ID: [name] MRN: [id number] DOB/AGE: Dec 11, 1932 85 y.o.  Admit date: 06/16/2018 Discharge date: 06/18/2018  Admission Diagnoses:  PRIMARY OSTEOARTHRITIS OF RIGHT KNEE   Discharge Diagnoses: Patient Active Problem List   Diagnosis Date Noted  . Total knee replacement status 06/16/2018  . Varicose veins of leg with swelling, bilateral 04/08/2018  . Status post total left knee replacement 01/10/2018  . Monoclonal gammopathy of unknown significance (MGUS) 06/11/2016  . Abscess of gluteal region 02/21/2016  . Psoas abscess ([place]) 02/21/2016  . Malignant neoplasm of urinary bladder ([place]) 08/23/2015  . History of repair of rotator cuff 08/23/2015  . Impingement syndrome of shoulder 03/30/2015  . Impingement syndrome of right shoulder 03/30/2015  . Anemia, iron deficiency 02/02/2015  . IDA (iron deficiency anemia) 01/26/2015  . Malignant neoplasm of bladder neck ([place]) 12/30/2014  . Colon polyp 12/17/2014  . Inflammation of sacroiliac joint ([place]) 09/22/2014  . Carotid arterial disease ([place]) 01/09/2014  . Combined fat and carbohydrate induced hyperlipemia 01/09/2014  . Accumulation of fluid in tissues 01/09/2014  . Benign hypertension with CKD (chronic kidney disease) stage III ([place]) 01/09/2014  . Arthritis, degenerative 01/09/2014  . OP (osteoporosis) 01/09/2014  . Carotid artery disease ([place]) 01/09/2014  . Edema 01/09/2014  . Cervical osteoarthritis 10/28/2013  . DDD (degenerative disc disease), cervical 10/28/2013  . Acute blood loss anemia 05/16/2012  . Fracture of parietal bone ([place]) 05/16/2012  . Fall down stairs 05/16/2012  . Hemorrhage into subarachnoid space of neuraxis ([place]) 05/16/2012  . Subdural hematoma ([place]) 05/16/2012  . Fracture of temporal bone ([place]) 05/16/2012  . Traumatic subdural hemorrhage with loss of consciousness ([place]) 05/16/2012    Past Medical History:  Diagnosis Date  . Anemia   . Anemia   . Bladder  cancer ([place])   . Cancer [facility]) 2016   bladder tumor  . Carotid arterial disease ([place]) 01/09/2014   Overview:  Minimal on screening   . Chronic low back pain   . Colon polyp   . DDD (degenerative disc disease), cervical   . DDD (degenerative disc disease), lumbar   . Elevated lipids   . Erosive esophagitis   . Foot pain   . GERD (gastroesophageal reflux disease)   . Glaucoma   . H/O concussion 04/2012   Due to fall at [facility]  . Heart murmur   . History of bone density study   . History of chickenpox   . HOH (hard of hearing)    Bilateral  Hearing Aids  . Hypertension   . Lower back pain   . Lower extremity edema   . Multinodular thyroid   . Multiple gastric ulcers   . Osteoporosis   . Personal history of chemotherapy    bladder  . 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 Thyroid disease      Transfusion: No transfusions during this admission   Consultants (if any):   Discharged Condition: Improved  Hospital Course: [name] is an 85 y.o. female who was admitted 06/16/2018 with a diagnosis of degenerative arthrosis right knee and went to the operating room on 06/16/2018 and underwent the above named procedures.    Surgeries:Procedure(s): COMPUTER ASSISTED TOTAL KNEE ARTHROPLASTY on 06/16/2018  PRE-OPERATIVE DIAGNOSIS: Degenerative arthrosis of the right knee, primary  POST-OPERATIVE DIAGNOSIS:  Same  PROCEDURE:  Right total knee arthroplasty using computer-assisted navigation  SURGEON:  Marciano Sequin. M.D.  ASSISTANT:  Liliane Bade, PA-C (present and scrubbed throughout the  case, critical for assistance with exposure, retraction, instrumentation, and closure)  ANESTHESIA: general  ESTIMATED BLOOD LOSS: 20 mL  FLUIDS REPLACED: 1000 mL of crystalloid  TOURNIQUET TIME: 86 minutes  DRAINS: 2 medium Hemovac drains  SOFT TISSUE RELEASES: Anterior cruciate ligament, posterior cruciate ligament, deep medial collateral ligament,  patellofemoral ligament  IMPLANTS UTILIZED: DePuy Attune size 3 posterior stabilized femoral component (cemented), size 4 rotating platform tibial component (cemented), 35 mm medialized dome patella (cemented), and a 6 mm stabilized rotating platform polyethylene insert.  INDICATIONS FOR SURGERY: [name] is a 85 y.o. year old female with a long history of progressive knee pain. X-rays demonstrated severe degenerative changes in tricompartmental fashion. The patient had not seen any significant improvement despite conservative nonsurgical intervention. After discussion of the risks and benefits of surgical intervention, the patient expressed understanding of the risks benefits and agree with plans for total knee arthroplasty.   The risks, benefits, and alternatives were discussed at length including but not limited to the risks of infection, bleeding, nerve injury, stiffness, blood clots, the need for revision surgery, cardiopulmonary complications, among others, and they were willing to proceed. Patient tolerated the surgery well. No complications .Patient was taken to PACU where she was stabilized and then transferred to the orthopedic floor.  Patient started on Lovenox 30 mg q 12 hrs. Foot pumps applied bilaterally at 80 mm hgb. Heels elevated off bed with rolled towels. No evidence of DVT. Calves non tender. Negative [place]. Physical therapy started on day #1 for gait training and transfer with OT starting on  day #1 for ADL and assisted devices. Patient has done well with therapy. Ambulated greater than 200 feet upon being discharged.  Was able to ascend and descend 4 steps safely and independently  Patient's IV And Foley were discontinued on day #1 with Hemovac being discontinued on day #2. Dressing was changed on day 2 prior to patient being discharged   She was given perioperative antibiotics:  Anti-infectives (From admission, onward)   Start     Dose/Rate Route Frequency  Ordered Stop   06/16/18 1900  ceFAZolin (ANCEF) IVPB 2g/100 mL premix     2 g 200 mL/hr over 30 Minutes Intravenous Every 6 hours 06/16/18 1757 06/17/18 1859   06/16/18 1441  gentamicin (GARAMYCIN) 80 mg in sodium chloride 0.9 % 500 mL irrigation  Status:  Discontinued       As needed 06/16/18 1441 06/16/18 1613   06/16/18 1008  ceFAZolin (ANCEF) 2-4 GM/100ML-% IVPB    Note to Pharmacy:  Milinda Cave   : cabinet override      06/16/18 1008 06/16/18 1308   06/16/18 0600  ceFAZolin (ANCEF) IVPB 2g/100 mL premix     2 g 200 mL/hr over 30 Minutes Intravenous On call to O.R. 06/15/18 2352 06/16/18 1320    .  She was fitted with AV 1 compression foot pump devices, instructed on heel pumps, early ambulation, and fitted with TED stockings bilaterally for DVT prophylaxis.  She benefited maximally from the hospital stay and there were no complications.    Recent vital signs:  Vitals:   06/17/18 0429 06/17/18 0746  BP: 134/60 (!) 139/49  Pulse: 60 (!) 47  Resp: 18 16  Temp: (!) 97.5 F (36.4 C) 97.8 F (36.6 C)  SpO2: 97% 98%    Recent laboratory studies:  Lab Results  Component Value Date   HGB 12.3 06/03/2018   HGB 11.9 (L) 04/14/2018   HGB 11.8 (L) 12/18/2017  Lab Results  Component Value Date   WBC 5.3 06/03/2018   PLT 232 06/03/2018   Lab Results  Component Value Date   INR 1.10 06/03/2018   Lab Results  Component Value Date   NA 139 06/03/2018   K 3.9 06/03/2018   CL 107 06/03/2018   CO2 25 06/03/2018   BUN 25 (H) 06/03/2018   CREATININE 0.90 06/03/2018   GLUCOSE 100 (H) 06/03/2018    Discharge Medications:   Allergies as of 06/18/2018   No Known Allergies     Medication List    TAKE these medications   calcium-vitamin D 500-200 MG-UNIT tablet Commonly known as:  OSCAL WITH D Take 1 tablet by mouth daily.   carvedilol 12.5 MG tablet Commonly known as:  COREG Take 12.5 mg by mouth 2 (two) times daily with a meal.   diltiazem 240 MG 24 hr  capsule Commonly known as:  CARDIZEM CD Take 240 mg by mouth daily.   enoxaparin 40 MG/0.4ML injection Commonly known as:  LOVENOX Inject 0.4 mLs (40 mg total) into the skin daily for 14 days. Start taking on:  June 19, 2018   latanoprost 0.005 % ophthalmic solution Commonly known as:  XALATAN Place 1 drop into both eyes at bedtime.   pravastatin 40 MG tablet Commonly known as:  PRAVACHOL Take 40 mg by mouth at bedtime.   raloxifene 60 MG tablet Commonly known as:  EVISTA Take 60 mg by mouth daily.   traMADol 50 MG tablet Commonly known as:  ULTRAM Take 1-2 tablets (50-100 mg total) by mouth every 4 (four) hours as needed for moderate pain.   vitamin B-12 1000 MCG tablet Commonly known as:  CYANOCOBALAMIN Take 2,000 mcg by mouth daily.   Vitamin D3 25 MCG (1000 UT) Caps Take 1,000 Units by mouth daily.            Durable Medical Equipment  (From admission, onward)         Start     Ordered   06/16/18 1758  DME Walker rolling  Once    Question:  Patient needs a walker to treat with the following condition  Answer:  Total knee replacement status   06/16/18 1757   06/16/18 1758  DME Bedside commode  Once    Question:  Patient needs a bedside commode to treat with the following condition  Answer:  Total knee replacement status   06/16/18 1757          Diagnostic Studies: Dg Knee Right Port  Result Date: 06/16/2018 CLINICAL DATA:  85 year old female post knee replacement. Initial encounter. EXAM: PORTABLE RIGHT KNEE - 1-2 VIEW COMPARISON:  09/06/2009 MR. FINDINGS: Post total right knee replacement. On lateral view, lucency posterior aspect of the distal femur. This may be related to projection but can not exclude fracture. Drain is in place. Lucencies of the distal femur and proximal tibia consistent with prior external fixation device. IMPRESSION: Post total right knee replacement. On lateral view, lucency posterior aspect of the distal femur. This may be  related to projection/rotation however, cannot exclude fracture. This can be reassessed on close follow-up plain film exam without rotation. These results will be called to the ordering clinician or representative by the Radiologist Assistant, and communication documented in the PACS or zVision Dashboard. Electronically Signed   By: Genia Del M.D.   On: 06/16/2018 16:52    Disposition:     Follow-up Information    Watt Climes, PA On 07/01/2018.  Specialty:  Physician Assistant Why:  att 9:45am Contact information: [place] [address] [phone]        Dereck Leep, MD On 07/29/2018.   Specialty:  Orthopedic Surgery Why:  at 9:45am Contact information: [place] [address] [phone]            Signed: Watt Climes 06/17/2018, 7:48 AM

## 2018-06-17 NOTE — Evaluation (Signed)
Physical Therapy Evaluation Patient Details Name: [name] MRN: [id number] DOB: March 28, 1933 Today's Date: 06/17/2018   History of Present Illness  Pt is an 85 yo female pt diagnosed with degenerative arthrosis of the right knee and is s/p elective R TKA. PMH includes L TKA (8/19), bladder CA, DDD, GERD, osteoporosis, HTN, R shoulder RTC repair (9/17), and hearing loss.     Clinical Impression  Pt presents with min deficits in strength, transfers, mobility, gait, balance, R knee ROM, and activity tolerance but overall did very well during the session especially considering POD#1 status.  Pt was Mod Ind with bed mobility tasks and CGA with transfers demonstrating good eccentric and concentric control.  Pt was able to amb 125' with good cadence and step-through pattern with no instability noted.  Pt will benefit from HHPT services upon discharge to safely address above deficits for decreased caregiver assistance and eventual return to PLOF.       Follow Up Recommendations Home health PT;Supervision for mobility/OOB    Equipment Recommendations  None recommended by PT    Recommendations for Other Services       Precautions / Restrictions Precautions Precautions: Fall;Knee Precaution Booklet Issued: No Required Braces or Orthoses: Other Brace Other Brace: No KI required, pt able to perform Ind RLE SLRs without extensor lag Restrictions Weight Bearing Restrictions: Yes RLE Weight Bearing: Weight bearing as tolerated      Mobility  Bed Mobility Overal bed mobility: Modified Independent                Transfers Overall transfer level: Needs assistance Equipment used: Rolling walker (2 wheeled) Transfers: Sit to/from Stand Sit to Stand: Min guard         General transfer comment: Min verbal cues for sequencing with good eccentric and concentric control  Ambulation/Gait Ambulation/Gait assistance: Min guard Gait Distance (Feet): 125 Feet Assistive device:  Rolling walker (2 wheeled) Gait Pattern/deviations: Step-through pattern;Decreased step length - right;Decreased step length - left     General Gait Details: Good cadence considering POD#1 with step-through gait pattern and no LOB; mod verbal cues for amb closer to the RW.  Stairs Stairs: (Deferred)          Wheelchair Mobility    Modified Rankin (Stroke Patients Only)       Balance Overall balance assessment: History of Falls;Needs assistance   Sitting balance-Leahy Scale: Good     Standing balance support: Bilateral upper extremity supported Standing balance-Leahy Scale: Fair                               Pertinent Vitals/Pain Pain Assessment: No/denies pain    Home Living Family/patient expects to be discharged to:: Private residence Living Arrangements: Alone Available Help at Discharge: Family;Available PRN/intermittently(Son lives two houses down; pt has life alert system) Type of Home: House Home Access: Stairs to enter Entrance Stairs-Rails: Right Entrance Stairs-Number of Steps: 3 Home Layout: One level Home Equipment: Environmental consultant - 2 wheels;Cane - single point;Toilet riser;Shower seat - built Investment banker, operational      Prior Function Level of Independence: Needs assistance   Gait / Transfers Assistance Needed: Pt ambulating with RW the past 3 wks due to L hamstring "pull"/pain; per chart review 2-3 falls in past 12 months  ADL's / Homemaking Assistance Needed: pt indep with ADL, assist for IADL as needed, drives        Hand Dominance   Dominant Hand: Right  Extremity/Trunk Assessment   Upper Extremity Assessment Upper Extremity Assessment: Defer to OT evaluation    Lower Extremity Assessment Lower Extremity Assessment: Generalized weakness;RLE deficits/detail RLE: Unable to fully assess due to pain RLE Sensation: WNL    Cervical / Trunk Assessment Cervical / Trunk Assessment: Normal  Communication   Communication: HOH   Cognition Arousal/Alertness: Awake/alert Behavior During Therapy: WFL for tasks assessed/performed Overall Cognitive Status: Within Functional Limits for tasks assessed                                        General Comments      Exercises Total Joint Exercises Ankle Circles/Pumps: AROM;Strengthening;Both;10 reps Quad Sets: AROM;Strengthening;Right;5 reps;10 reps Gluteal Sets: Strengthening;Both;10 reps Heel Slides: AROM;Right;5 reps Straight Leg Raises: AROM;Both;10 reps Long Arc Quad: AROM;Strengthening;Right;10 reps;15 reps Knee Flexion: AROM;Strengthening;Right;10 reps;15 reps Goniometric ROM: R knee AROM: 3-80 deg Marching in Standing: AROM;Both;10 reps Other Exercises Other Exercises: HEP education for BLE APs, GS, LAQs, and QS per handout Other Exercises: Positioning education to encourage R knee ext PROM   Assessment/Plan    PT Assessment Patient needs continued PT services  PT Problem List Decreased strength;Decreased range of motion;Decreased activity tolerance;Decreased balance       PT Treatment Interventions DME instruction;Gait training;Stair training;Functional mobility training;Therapeutic activities;Therapeutic exercise;Balance training;Patient/family education    PT Goals (Current goals can be found in the Care Plan section)  Acute Rehab PT Goals Patient Stated Goal: To walk better PT Goal Formulation: With patient Time For Goal Achievement: 06/30/18 Potential to Achieve Goals: Good    Frequency BID   Barriers to discharge        Co-evaluation               AM-PAC PT "6 Clicks" Mobility  Outcome Measure Help needed turning from your back to your side while in a flat bed without using bedrails?: None Help needed moving from lying on your back to sitting on the side of a flat bed without using bedrails?: None Help needed moving to and from a bed to a chair (including a wheelchair)?: A Little Help needed standing up from a  chair using your arms (e.g., wheelchair or bedside chair)?: A Little Help needed to walk in hospital room?: A Little Help needed climbing 3-5 steps with a railing? : A Little 6 Click Score: 20    End of Session Equipment Utilized During Treatment: Gait belt Activity Tolerance: Patient tolerated treatment well Patient left: in chair;with chair alarm set;with call bell/phone within reach;with SCD's reapplied;Other (comment)(Polar care donned to R knee) Nurse Communication: Mobility status PT Visit Diagnosis: Muscle weakness (generalized) (M62.81);Other abnormalities of gait and mobility (R26.89)    Time: 0925-1006 PT Time Calculation (min) (ACUTE ONLY): 41 min   Charges:   PT Evaluation $PT Eval Low Complexity: 1 Low PT Treatments $Therapeutic Exercise: 8-22 mins $Therapeutic Activity: 8-22 mins        D. Scott Graig [place] PT, DPT 06/17/18, 10:29 AM

## 2018-06-17 NOTE — Progress Notes (Signed)
Physical Therapy Treatment Patient Details Name: [name] MRN: [id number] DOB: Jun 22, 1932 Today's Date: 06/17/2018    History of Present Illness Pt is an 85 yo female pt diagnosed with degenerative arthrosis of the right knee and is s/p elective R TKA. PMH includes L TKA (8/19), bladder CA, DDD, GERD, osteoporosis, HTN, R shoulder RTC repair (9/17), and hearing loss.     PT Comments    Pt presents with min deficits in strength, transfers, mobility, gait, balance, R knee ROM, and activity tolerance but continues to make good progress towards goals.  Pt was able to amb 2 x 125' with good cadence and stability with step-through pattern.  Pt was able to ascend and descend one step x 3 with bilateral rails and CGA with good eccentric and concentric control but did require extensive verbal cues for proper sequencing.  Pt will benefit from HHPT services upon discharge to safely address above deficits for decreased caregiver assistance and eventual return to PLOF.     Follow Up Recommendations  Home health PT;Supervision for mobility/OOB     Equipment Recommendations  None recommended by PT    Recommendations for Other Services       Precautions / Restrictions Precautions Precautions: Fall;Knee Precaution Booklet Issued: No Required Braces or Orthoses: Other Brace Other Brace: No KI required, pt able to perform Ind RLE SLRs without extensor lag Restrictions Weight Bearing Restrictions: Yes RLE Weight Bearing: Weight bearing as tolerated    Mobility  Bed Mobility               General bed mobility comments: NT, pt in recliner  Transfers Overall transfer level: Needs assistance Equipment used: Rolling walker (2 wheeled) Transfers: Sit to/from Stand Sit to Stand: Min guard         General transfer comment: Min verbal cues for sequencing with good eccentric and concentric control  Ambulation/Gait Ambulation/Gait assistance: Min guard Gait Distance (Feet): 125  feet x 2 Assistive device: Rolling walker (2 wheeled) Gait Pattern/deviations: Step-through pattern;Decreased step length - right;Decreased step length - left     General Gait Details: Good cadence with step-through gait pattern and no LOB; min verbal cues for amb closer to the RW.   Stairs Stairs: Yes Stairs assistance: Min guard Stair Management: Two rails Number of Stairs: 1 General stair comments: Ascend/descend 1 step x 3 with mod-max verbal cues for sequencing   Wheelchair Mobility    Modified Rankin (Stroke Patients Only)       Balance Overall balance assessment: History of Falls;Needs assistance   Sitting balance-Leahy Scale: Good     Standing balance support: Bilateral upper extremity supported Standing balance-Leahy Scale: Fair                              Cognition Arousal/Alertness: Awake/alert Behavior During Therapy: WFL for tasks assessed/performed Overall Cognitive Status: Within Functional Limits for tasks assessed                                        Exercises Total Joint Exercises Ankle Circles/Pumps: AROM;Strengthening;Both;10 reps Quad Sets: AROM;Strengthening;Right;10 reps Gluteal Sets: Strengthening;Both;10 reps Long Arc Quad: AROM;Strengthening;Right;10 reps;15 reps Knee Flexion: AROM;Strengthening;Right;10 reps;15 reps Other Exercises Other Exercises: HEP education for BLE APs, GS, LAQs, and QS per handout    General Comments        Pertinent Vitals/Pain  Pain Assessment: No/denies pain    Home Living                      Prior Function            PT Goals (current goals can now be found in the care plan section) Progress towards PT goals: Progressing toward goals    Frequency    BID      PT Plan Current plan remains appropriate    Co-evaluation              AM-PAC PT "6 Clicks" Mobility   Outcome Measure  Help needed turning from your back to your side while in a flat  bed without using bedrails?: None Help needed moving from lying on your back to sitting on the side of a flat bed without using bedrails?: None Help needed moving to and from a bed to a chair (including a wheelchair)?: A Little Help needed standing up from a chair using your arms (e.g., wheelchair or bedside chair)?: A Little Help needed to walk in hospital room?: A Little Help needed climbing 3-5 steps with a railing? : A Little 6 Click Score: 20    End of Session Equipment Utilized During Treatment: Gait belt Activity Tolerance: Patient tolerated treatment well Patient left: in chair;with chair alarm set;with call bell/phone within reach;with SCD's reapplied;Other (comment)(Polar care donned to R knee) Nurse Communication: Mobility status PT Visit Diagnosis: Muscle weakness (generalized) (M62.81);Other abnormalities of gait and mobility (R26.89)     Time: 6237-6283 PT Time Calculation (min) (ACUTE ONLY): 27 min  Charges:  $Gait Training: 8-22 mins $Therapeutic Exercise: 8-22 mins                     D. Scott Dwayna Kentner PT, DPT 06/17/18, 2:29 PM

## 2018-06-17 NOTE — Evaluation (Signed)
Occupational Therapy Evaluation Patient Details Name: [name] MRN: [id number] DOB: 19-May-1933 Today's Date: 06/17/2018    History of Present Illness 85yo female pt POD1 s/p R TKA with PMHx of L TKA (8/19), bladder CA, DDD, GERD, osteoporosis, HTN, R shoulder RTC repair (9/17), and hearing loss.    Clinical Impression   Pt seen for OT evaluation this date, POD#1 from above surgery. Pt was independent in all ADLs prior to surgery, however using a 2WW for past 3 weeks 2/2 L hamstring pain/"pull." Pt is eager to return to PLOF with less pain and improved safety and independence. Pt reports "easy recovery" after previous L TKA in August. Pt currently requires PRN minimal assist for LB dressing and bathing due to limited AROM of R knee. CGA for mobility with RW. Pt denies pain t/o session. Pt instructed in AE/DME for ADL tasks, falls prevention strategies, compression stocking mgt, and polar care mgt - pt verbalized understanding of all training and verbalized plan already implemented in preparation for surgery in coordination with family for safe return home. Pt benefited maximally from OT services. No further OT services indicated at this time. Will sign off. Please re-consult if additional needs arise.      Follow Up Recommendations  No OT follow up    Equipment Recommendations  None recommended by OT    Recommendations for Other Services       Precautions / Restrictions Precautions Precautions: Fall;Knee Precaution Booklet Issued: No Required Braces or Orthoses: (no KI needed, pt able to perform SLR independently) Restrictions Weight Bearing Restrictions: Yes RLE Weight Bearing: Weight bearing as tolerated      Mobility Bed Mobility Overal bed mobility: Modified Independent                Transfers Overall transfer level: Needs assistance Equipment used: Rolling walker (2 wheeled) Transfers: Sit to/from Stand Sit to Stand: Min guard         General  transfer comment: no difficulty performing    Balance Overall balance assessment: No apparent balance deficits (not formally assessed);History of Falls                                         ADL either performed or assessed with clinical judgement   ADL Overall ADL's : Needs assistance/impaired                     Lower Body Dressing: Sit to/from stand;Minimal assistance Lower Body Dressing Details (indicate cue type and reason): Min a to thread clothing over R foot, but pt able to otherwise perform with CGA once in standing             Functional mobility during ADLs: Min guard;Rolling walker       Vision Baseline Vision/History: Wears glasses Wears Glasses: Reading only Patient Visual Report: No change from baseline       Perception     Praxis      Pertinent Vitals/Pain Pain Assessment: No/denies pain     Hand Dominance Right   Extremity/Trunk Assessment Upper Extremity Assessment Upper Extremity Assessment: Overall WFL for tasks assessed(strength WFL, hx impaired R shoulder flexion to approx 90 degrees 2/2 old RTC injury/repair, functionally able to do most things)   Lower Extremity Assessment Lower Extremity Assessment: Overall WFL for tasks assessed(better than expected post-op strength/ROM deficits)   Cervical / Trunk Assessment Cervical /  Trunk Assessment: Normal   Communication Communication Communication: HOH   Cognition Arousal/Alertness: Awake/alert Behavior During Therapy: WFL for tasks assessed/performed Overall Cognitive Status: Within Functional Limits for tasks assessed                                     General Comments       Exercises Other Exercises Other Exercises: pt edu in AE/DME for ADL tasks, falls prevention strategies, compression stocking mgt, and polar care mgt - pt verbalized understanding of all training and verbalized plan already implemented in preparation for surgery in  coordination with family for safe return home   Shoulder Instructions      Home Living Family/patient expects to be discharged to:: Private residence Living Arrangements: Alone Available Help at Discharge: Family;Available PRN/intermittently(3 sons live nearby can assist as needed) Type of Home: House Home Access: Stairs to enter [employer] of Steps: 3 Entrance Stairs-Rails: Right Home Layout: One level     Bathroom Shower/Tub: Occupational psychologist: Handicapped height(raised toilets, 1 w/ a riser)     Home Equipment: Environmental consultant - 2 wheels;Cane - single point;Toilet riser;Shower seat - built in;Adaptive equipment(life alert pendant) Adaptive Equipment: Reacher        Prior Functioning/Environment Level of Independence: Needs assistance  Gait / Transfers Assistance Needed: Pt ambulating with RW the past 3 wks 2/2 L hamstring "pull"/pain; per chart review 2-3 falls in past 12 months ADL's / Homemaking Assistance Needed: pt indep with ADL, assist for IADL as needed, drives            OT Problem List: Decreased range of motion;Decreased strength      OT Treatment/Interventions:      OT Goals(Current goals can be found in the care plan section) Acute Rehab OT Goals Patient Stated Goal: to recover at home and get back to PLOF with less pain OT Goal Formulation: All assessment and education complete, DC therapy  OT Frequency:     Barriers to D/C:            Co-evaluation              AM-PAC OT "6 Clicks" Daily Activity     Outcome Measure Help from another person eating meals?: None Help from another person taking care of personal grooming?: None Help from another person toileting, which includes using toliet, bedpan, or urinal?: A Little Help from another person bathing (including washing, rinsing, drying)?: A Little Help from another person to put on and taking off regular upper body clothing?: None Help from another person to put on and  taking off regular lower body clothing?: A Little 6 Click Score: 21   End of Session Equipment Utilized During Treatment: Rolling walker  Activity Tolerance: Patient tolerated treatment well Patient left: in chair;with call bell/phone within reach;with chair alarm set;Other (comment)(PT in room)  OT Visit Diagnosis: Other abnormalities of gait and mobility (R26.89)                Time: 3235-5732 OT Time Calculation (min): 24 min Charges:  OT General Charges $OT Visit: 1 Visit OT Evaluation $OT Eval Low Complexity: 1 Low OT Treatments $Self Care/Home Management : 8-22 mins  Jeni Salles, MPH, MS, OTR/L ascom [phone] 06/17/18, 9:50 AM

## 2018-06-17 NOTE — Progress Notes (Signed)
Clinical Social Worker (CSW) received SNF consult. PT is recommending home health. RN case manager aware of above. Please reconsult if future social work needs arise. CSW signing off.   Shaylie Eklund, LCSW [phone] 

## 2018-06-18 MED ORDER — TRAMADOL HCL 50 MG PO TABS: 50.0000 mg | ORAL_TABLET | ORAL | 0 refills | Status: DC | PRN

## 2018-06-18 MED ORDER — ENOXAPARIN SODIUM 40 MG/0.4ML ~~LOC~~ SOLN: 40.0000 mg | SUBCUTANEOUS | 0 refills | Status: DC

## 2018-06-18 NOTE — Progress Notes (Signed)
Discharge instructions reviewed with patient. IV removed prior to RN arrival. Surgical site cleansed and new dressing applied. Patient awaiting her sons arrival to transport home. Nurse Tech notified to assist with dressing and packing of belongings.

## 2018-06-18 NOTE — Progress Notes (Signed)
Physical Therapy Treatment Patient Details Name: [name] MRN: [id number] DOB: [birth year] Today's Date: 06/18/2018    History of Present Illness Pt is an 85 yo female pt diagnosed with degenerative arthrosis of the right knee and is s/p elective R TKA. PMH includes L TKA (8/19), bladder CA, DDD, GERD, osteoporosis, HTN, R shoulder RTC repair (9/17), and hearing loss.     PT Comments    Pt agreeable to PT; denies pain R knee (only mild discomfort with stretching). Pt progressing all mobility with I bed mobility, Mod I STS transfers, Min guard to supervision ambulation with progressed distance to 250 ft and demonstrates learning of stair climbing sequence from prior session. Range improved this session in R knee to 1-92 degrees. Pt receives up in chair and is ready for discharge from PT stand point. Nursing notified.    Follow Up Recommendations  Home health PT;Supervision for mobility/OOB     Equipment Recommendations  None recommended by PT    Recommendations for Other Services       Precautions / Restrictions Precautions Precautions: Fall;Knee Restrictions Weight Bearing Restrictions: Yes RLE Weight Bearing: Weight bearing as tolerated    Mobility  Bed Mobility Overal bed mobility: Independent                Transfers Overall transfer level: Modified independent Equipment used: Rolling walker (2 wheeled) Transfers: Sit to/from Stand Sit to Stand: Modified independent (Device/Increase time)         General transfer comment: good use of hands; good speed, no LOB/unsteadiness. Slightly increased use of LLE versus RLE  Ambulation/Gait Ambulation/Gait assistance: Min guard;Supervision Gait Distance (Feet): 250 Feet Assistive device: Rolling walker (2 wheeled) Gait Pattern/deviations: Step-through pattern     General Gait Details: Excellent cadence/fluidity. No pain.    Stairs Stairs: (reviewed sequence; pt demonstrates learning from prior rx)           Wheelchair Mobility    Modified Rankin (Stroke Patients Only)       Balance Overall balance assessment: History of Falls                                          Cognition Arousal/Alertness: Awake/alert Behavior During Therapy: WFL for tasks assessed/performed Overall Cognitive Status: Within Functional Limits for tasks assessed                                        Exercises Total Joint Exercises Quad Sets: Strengthening;Both;20 reps Knee Flexion: AROM;Right;10 reps;Seated(3 positions each rep with hold) Goniometric ROM: 1-92    General Comments        Pertinent Vitals/Pain Pain Assessment: No/denies pain(only mild with stretching)    Home Living                      Prior Function            PT Goals (current goals can now be found in the care plan section) Progress towards PT goals: Progressing toward goals    Frequency    BID      PT Plan Current plan remains appropriate    Co-evaluation              AM-PAC PT "6 Clicks" Mobility   Outcome Measure  Help needed turning from  your back to your side while in a flat bed without using bedrails?: None Help needed moving from lying on your back to sitting on the side of a flat bed without using bedrails?: None Help needed moving to and from a bed to a chair (including a wheelchair)?: A Little Help needed standing up from a chair using your arms (e.g., wheelchair or bedside chair)?: None Help needed to walk in hospital room?: A Little Help needed climbing 3-5 steps with a railing? : A Little 6 Click Score: 21    End of Session Equipment Utilized During Treatment: Gait belt Activity Tolerance: Patient tolerated treatment well Patient left: in chair;with call bell/phone within reach;Other (comment)(polar care in place; declines alarm, will call for A)   PT Visit Diagnosis: Muscle weakness (generalized) (M62.81);Other abnormalities of gait and  mobility (R26.89)     Time: 1000-1031 PT Time Calculation (min) (ACUTE ONLY): 31 min  Charges:  $Gait Training: 8-22 mins $Therapeutic Exercise: 8-22 mins                      Larae Grooms, PTA 06/18/2018, 10:41 AM

## 2018-06-18 NOTE — Progress Notes (Signed)
Subjective: 2 Days Post-Op Procedure(s) (LRB): COMPUTER ASSISTED TOTAL KNEE ARTHROPLASTY (Right) Patient reports pain as 0 on 0-10 scale.  Has had no pain since surgery. Patient is well, and has had no acute complaints or problems Patient did very well with therapy yesterday.  Met all goals to go home.  Did the lap around station as well steps Plan is to go Home after hospital stay. no nausea and no vomiting Patient denies any chest pains or shortness of breath. Objective: Vital signs in last 24 hours: Temp:  [97.5 F (36.4 C)-97.8 F (36.6 C)] 97.5 F (36.4 C) (01/21 1527) Pulse Rate:  [47-48] 48 (01/22 0043) Resp:  [16-19] 19 (01/22 0043) BP: (135-179)/(49-64) 179/64 (01/22 0043) SpO2:  [98 %-100 %] 99 % (01/22 0043) well approximated incision Heels are non tender and elevated off the bed using rolled towels Intake/Output from previous day: 01/21 0701 - 01/22 0700 In: 1140 [P.O.:840; IV Piggyback:300] Out: 205 [Drains:205] Intake/Output this shift: No intake/output data recorded.  No results for input(s): HGB in the last 72 hours. No results for input(s): WBC, RBC, HCT, PLT in the last 72 hours. No results for input(s): NA, K, CL, CO2, BUN, CREATININE, GLUCOSE, CALCIUM in the last 72 hours. No results for input(s): LABPT, INR in the last 72 hours.  EXAM General - Patient is Alert, Appropriate and Oriented Extremity - Neurologically intact Neurovascular intact Sensation intact distally Intact pulses distally Dorsiflexion/Plantar flexion intact No cellulitis present Compartment soft Dressing - dressing C/D/I Motor Function - intact, moving foot and toes well on exam.  Able to do straight leg raise on own  Past Medical History:  Diagnosis Date  . Anemia   . Anemia   . Bladder cancer ([place])   . Cancer [facility]) 2016   bladder tumor  . Carotid arterial disease ([place]) 01/09/2014   Overview:  Minimal on screening   . Chronic low back pain   . Colon polyp   . DDD  (degenerative disc disease), cervical   . DDD (degenerative disc disease), lumbar   . Elevated lipids   . Erosive esophagitis   . Foot pain   . GERD (gastroesophageal reflux disease)   . Glaucoma   . H/O concussion 04/2012   Due to fall at [facility]  . Heart murmur   . History of bone density study   . History of chickenpox   . HOH (hard of hearing)    Bilateral  Hearing Aids  . Hypertension   . Lower back pain   . Lower extremity edema   . Multinodular thyroid   . Multiple gastric ulcers   . Osteoporosis   . Personal history of chemotherapy    bladder  . 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[place])   . Thyroid disease     Assessment/Plan: 2 Days Post-Op Procedure(s) (LRB): COMPUTER ASSISTED TOTAL KNEE ARTHROPLASTY (Right) Active Problems:   Total knee replacement status  Estimated body mass index is 29.08 kg/m as calculated from the following:   Height as of this encounter: _0  (1.549 m).   Weight as of this encounter: 69.8 kg. Up with therapy Discharge home with home health  Labs: None DVT Prophylaxis - Lovenox, Foot Pumps and TED hose Weight-Bearing as tolerated to right leg Hemovac was discontinued.  Ends of the drain appeared to be intact Please wash operative leg, change dressing and apply TED stockings to both legs prior to being discharged Please give the patient 2 extra honeycomb dressings take home  Wille Glaser  Lindon Romp PA [place] 06/18/2018, 7:37 AM

## 2018-06-18 NOTE — Care Management (Signed)
Checked the price of the Lovenox for the Patient and notified the patient of the price $121.89, Patient says she can afford it. No DME needs has everything she needs at home already, HHPT set up with [facility]

## 2018-07-01 IMAGING — CT CT HEAD W/O CM
3 of 7 series · 15 of 47 positions shown, 18 images · non-contrast
Comparison: None.

CLINICAL DATA: Fall, hit nose with bleeding

EXAM:
CT HEAD WITHOUT CONTRAST
CT MAXILLOFACIAL WITHOUT CONTRAST
TECHNIQUE: Multidetector CT imaging of the head and maxillofacial structures
were performed using the standard protocol without intravenous
contrast. Multiplanar CT image reconstructions of the maxillofacial
structures were also generated.

[Series 4: max soft · axial · 0.33mm/px · z∈[-241,-97]mm · 11 of 80 slices shown, 14 images]
[im 4/80  brain]
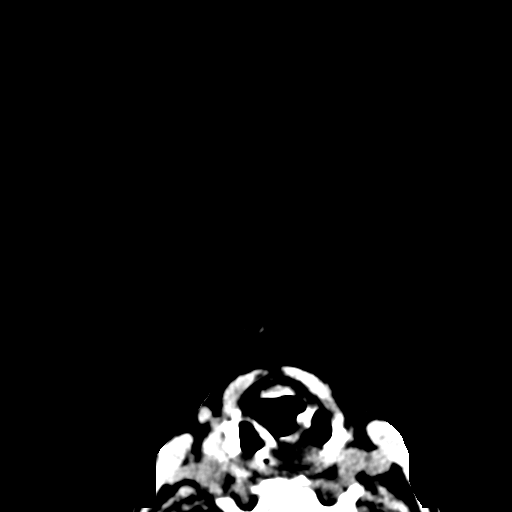
[im 4/80  bone]
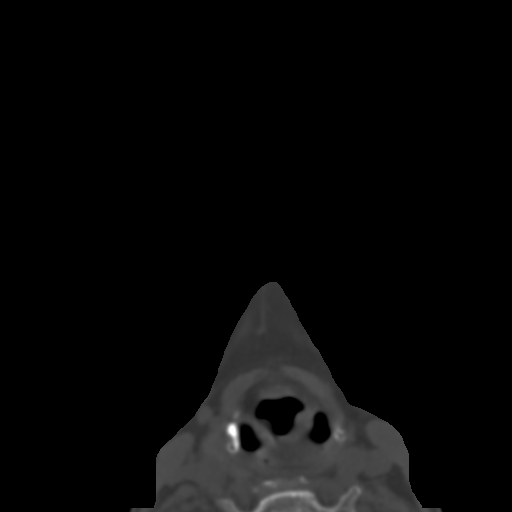
[im 12/80  brain]
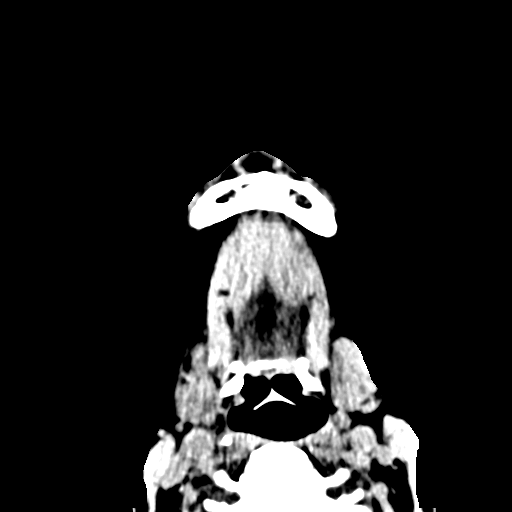
[im 19/80  brain]
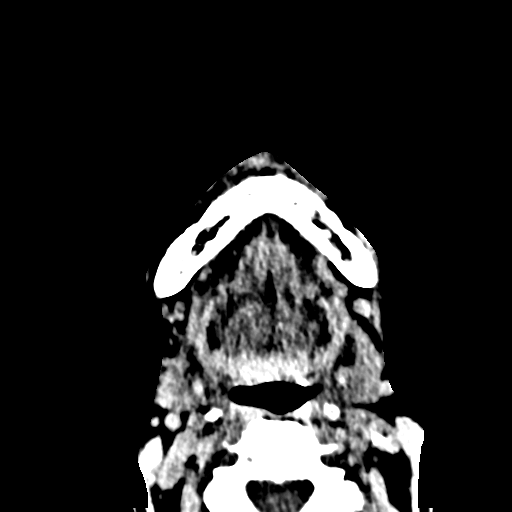
[im 27/80  brain]
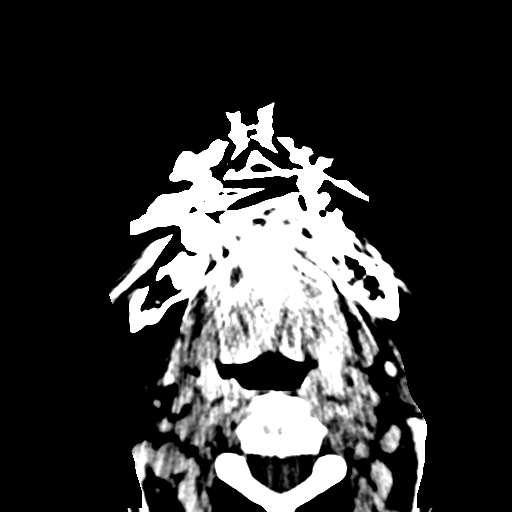
[im 34/80  brain]
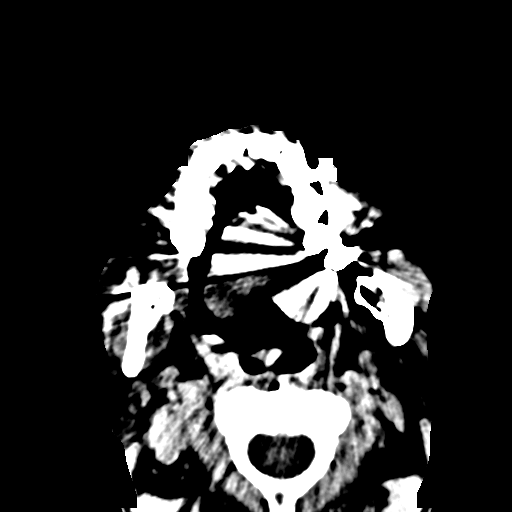
[im 34/80  bone]
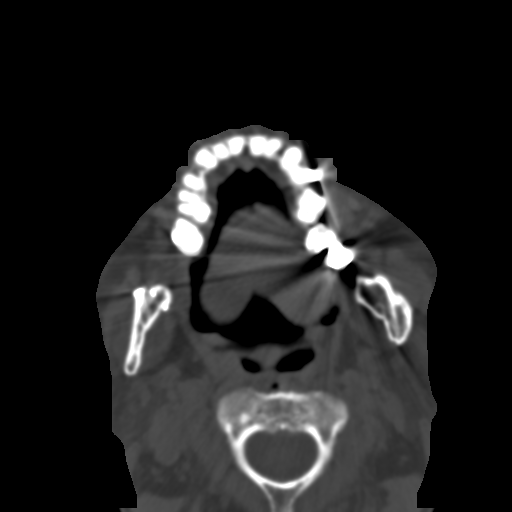
[im 42/80  brain]
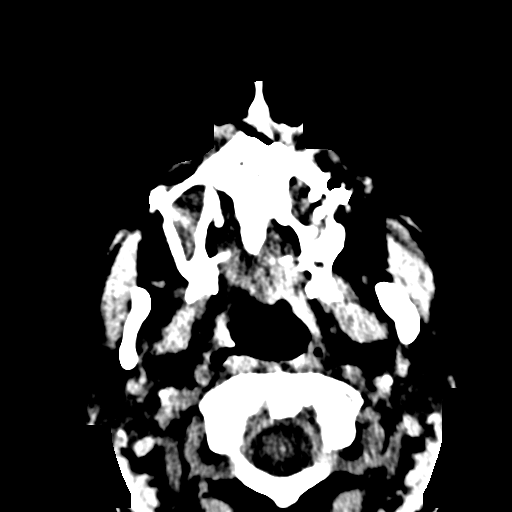
[im 46/80  brain]
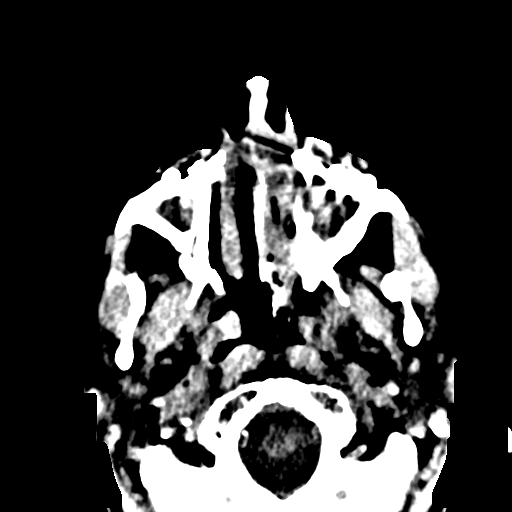
[im 53/80  brain]
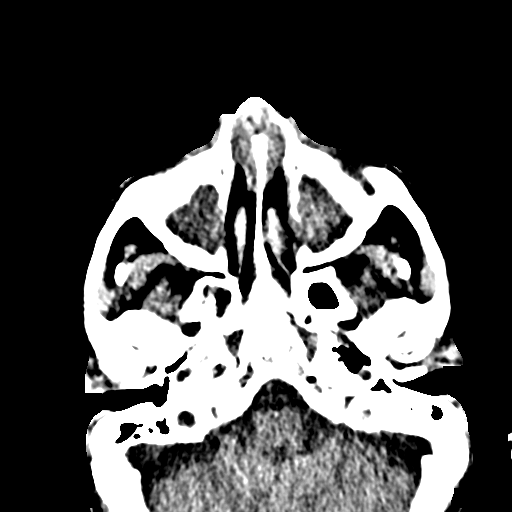
[im 61/80  brain]
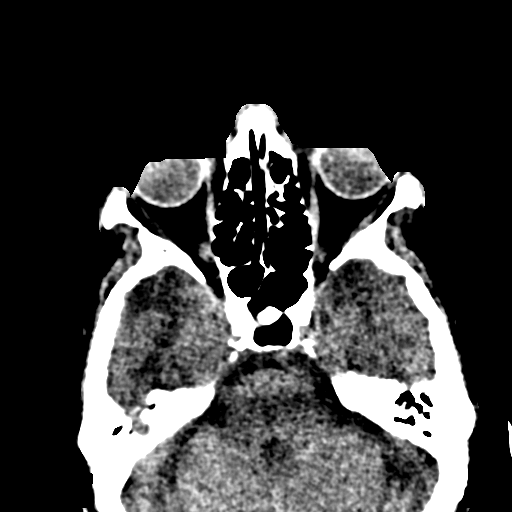
[im 61/80  bone]
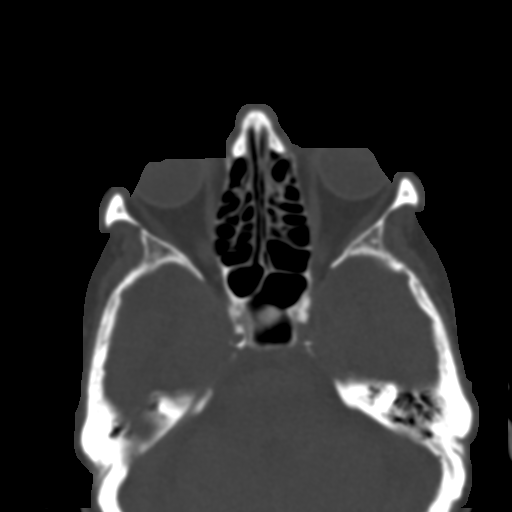
[im 68/80  brain]
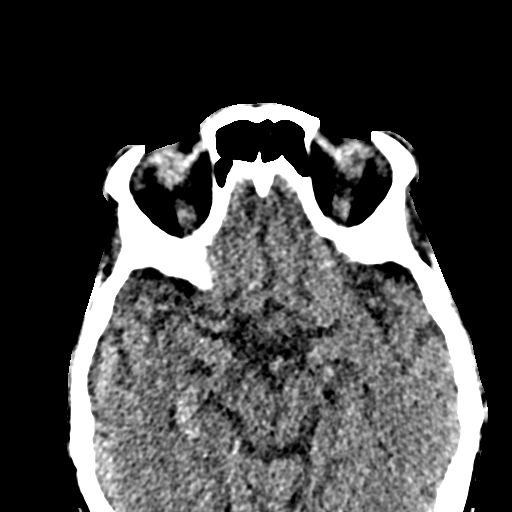
[im 76/80  brain]
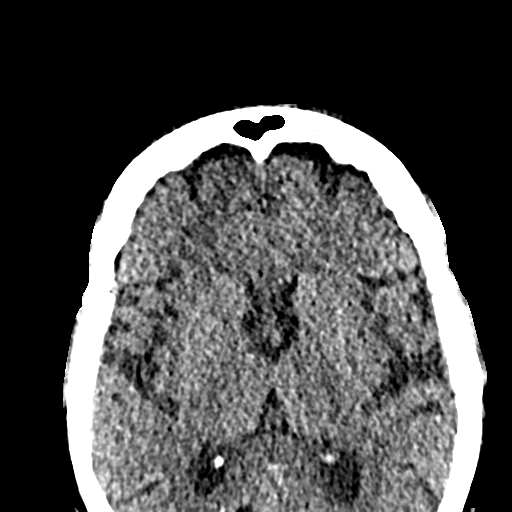

[Series 6: coronal soft tissue · coronal · 0.27mm/px · 3 of 61 slices shown]
[im 16/61  brain]
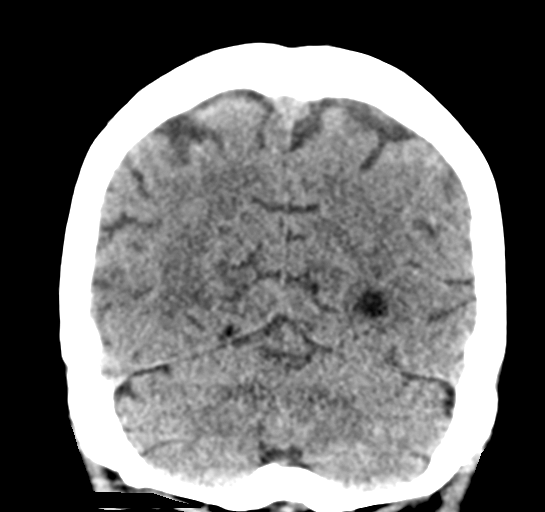
[im 31/61  brain]
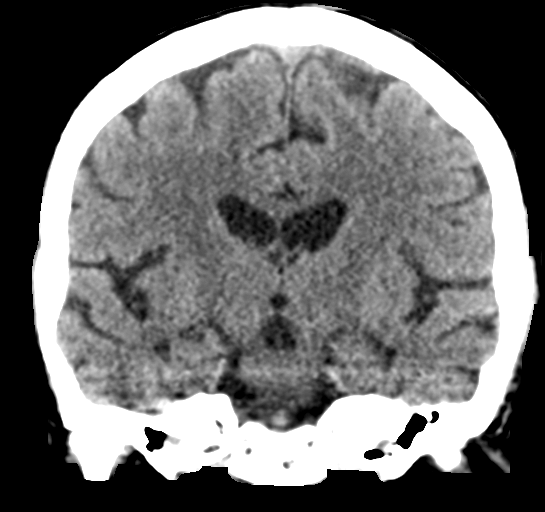
[im 46/61  brain]
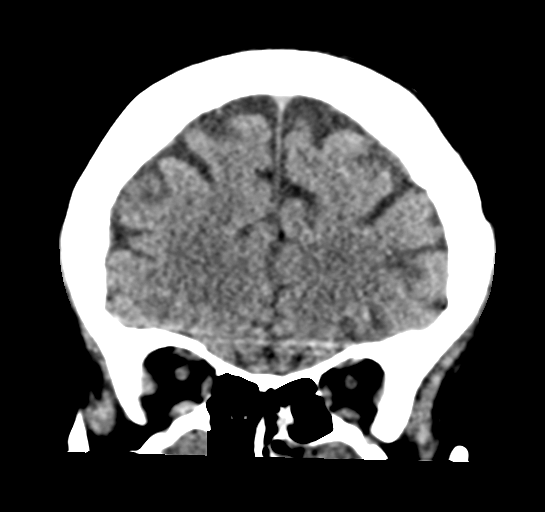

[Series 11: sagittal soft · sagittal · 0.31mm/px · 1 of 74 slices shown]
[im 37/74  brain]
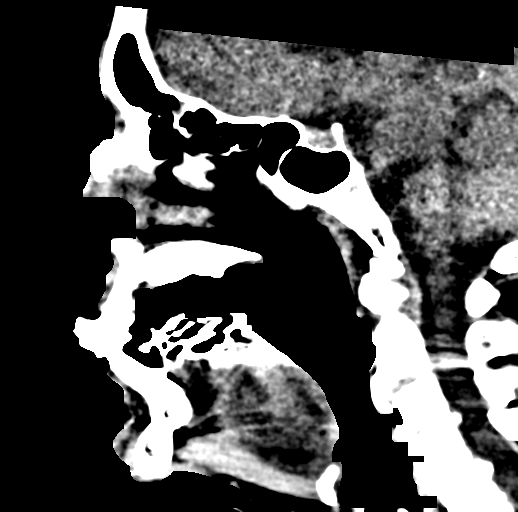

[15 of 47 positions shown; findings below may reference images not displayed]

FINDINGS: CT HEAD FINDINGS

Brain: No acute territorial infarction, hemorrhage or intracranial
mass is visualized. Probable small old right temporal lobe infarct.
Mild atrophy. Normal ventricle size

Vascular: No hyperdense vessel. Carotid artery calcification.
Vertebral artery calcifications

Skull: No fracture

Other: None

CT MAXILLOFACIAL FINDINGS

Osseous: Depression of the tip of the bilateral anterior nasal bones
consistent with fracture. Adjacent foci of soft tissue. Bilateral
mandibular heads are normally position. Mastoid air cells are clear.
No acute mandibular fracture is seen. Postsurgical changes of the
anterior mandible and at the angle of the right mandible. Pterygoid
plates and zygomatic arches are intact.

Orbits: Globes are intact. No intra or extraconal soft tissue
abnormality. No orbital fracture

Sinuses: Completely opacified maxillary sinuses with high density
secretions and calcifications. Moderate thickening in the ethmoid
sinuses. Evidence of prior maxillary sinus fracture with multiple
plate and screw fixation across the anterior and inferior walls of
the maxillary sinuses. Bony thickening and remodeling, consistent
with old trauma. Defects in the medial walls of both maxillary
sinuses are presumably due to old trauma deformity. There is a
linear lucency through the anterior maxillary bone which involves
the alveolar ridge, between the right and left central incisors,
this extends to the anterior nasal process of maxilla.

Soft tissues: Soft tissue swelling is present over the nose.
IMPRESSION: 1. No CT evidence for acute intracranial abnormality.  Mild atrophy
2. Acute, mildly depressed fracture at the anterior tip of the nasal
bones. Overlying soft tissue swelling
3. Evidence of prior maxillary sinus and mandibular fractures with
extensive presumed chronic deformity of the walls of the maxillary
sinus. Linear lucency through the anterior maxillary bone, through
the alveolar ridge and extending to the anterior nasal process of
the maxilla suspicious for a fracture, not certain if this is acute
or chronic.
4. Opacified maxillary sinuses containing high density secretion and
calcification.

## 2018-07-11 ENCOUNTER — Encounter (INDEPENDENT_AMBULATORY_CARE_PROVIDER_SITE_OTHER): Payer: Medicare Other

## 2018-07-11 ENCOUNTER — Ambulatory Visit (INDEPENDENT_AMBULATORY_CARE_PROVIDER_SITE_OTHER): Payer: Medicare Other | Admitting: Vascular Surgery

## 2018-10-13 ENCOUNTER — Inpatient Hospital Stay: Payer: Medicare Other

## 2018-10-14 ENCOUNTER — Other Ambulatory Visit: Payer: Medicare Other | Admitting: Urology

## 2018-10-16 ENCOUNTER — Ambulatory Visit: Payer: Medicare Other | Admitting: Oncology

## 2018-10-16 ENCOUNTER — Ambulatory Visit: Payer: Medicare Other

## 2018-10-29 ENCOUNTER — Encounter: Payer: Self-pay | Admitting: Urology

## 2018-10-29 ENCOUNTER — Ambulatory Visit: Payer: Medicare Other | Admitting: Urology

## 2018-10-29 ENCOUNTER — Other Ambulatory Visit: Payer: Self-pay

## 2018-10-29 VITALS — BP 160/94 | HR 83 | Ht 61.0 in | Wt 154.0 lb

## 2018-10-29 DIAGNOSIS — Z8551 Personal history of malignant neoplasm of bladder: Secondary | ICD-10-CM | POA: Diagnosis not present

## 2018-10-29 LAB — MICROSCOPIC EXAMINATION: Bacteria, UA: NONE SEEN

## 2018-10-29 LAB — URINALYSIS, COMPLETE
Bilirubin, UA: NEGATIVE
Glucose, UA: NEGATIVE
Ketones, UA: NEGATIVE
Nitrite, UA: NEGATIVE
Specific Gravity, UA: 1.02 (ref 1.005–1.030)
Urobilinogen, Ur: 0.2 mg/dL (ref 0.2–1.0)
pH, UA: 7 (ref 5.0–7.5)

## 2018-10-29 NOTE — Progress Notes (Signed)
8:26 AM  10/30/18   [name] 1932-10-04 [id number]  Referring provider: Kirk Ruths, MD [facility] [place], [place] [address]  Chief Complaint  Patient presents with  . Cysto    HPI: 85 year old nonsmoker with  incidental 11 mm papillary neoplasm near the RIGHT bladder neck. She was taken to the operating room on 09/29/2014 for TURBT, Bilateral retrograde pyelogramat which time a 2 cm extremely spherical mass was identified just proximal to the RIGHT UO. This was resected along with deeper biopsies of the base. Of note, bilateral retrograde pyelogram was negative for any obvious upper tract filling defects or hydronephrosis.   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 The tumor had an inverted architecture. Deeper biopsies did contain muscle after speaking with the pathologist today and the muscle was negative for any tumor. Additonally, CIS was noted.  She returned to the operating room on 11/02/2014 for restaging TURBT. Pathology showed no evidence of residual tumor.  She completed BCG x 6 for induction and first course of maintenence bcg x 3 doses  X 2.  Her last maintainance dose completed in 07/2015.     She developed recurrent gluteal abscess requiring multiple rounds of IV antibiotics and surgical intervention.  After this, additional BCG was deferred.  Most recent cross sectional imaging 10/2016 in the form of CT abd/ pelvis with contrast.  She returns today for surveillance cystoscopy.  She denies dysuria or gross hematuria.  Physical Exam: BP (!) 160/94   Pulse 83   Ht 5\' 1"  (1.549 m)   Wt 154 lb (69.9 kg)   BMI 29.10 kg/m   Constitutional:  Alert and oriented, No acute distress. HEENT: [place] AT, moist mucus membranes.  Trachea midline, no masses. Cardiovascular: No clubbing, cyanosis, or edema. Respiratory: Normal respiratory effort, no increased work of  breathing. GI: Abdomen is soft, nontender, nondistended, no abdominal masses  GU:  Normal external genitalia. Normal urethral meatus. Skin: No rashes, bruises or suspicious lesions. Neurologic: Grossly intact, no focal deficits, moving all 4 extremities. Psychiatric: Normal mood and affect.   Urinalysis UA reviewed, no evidence of infection   Cystoscopy Procedure Note  Patient identification was confirmed, informed consent was obtained, and patient was prepped using Betadine solution.  Lidocaine jelly was administered per urethral meatus.    Preoperative abx where received prior to procedure.    Procedure: - Flexible cystoscope introduced, without any difficulty.   - Thorough search of the bladder revealed:    normal urethral meatus    normal urothelium    no stones    no ulcers     no tumors    no urethral polyps    no trabeculation  - Ureteral orifices were normal in position and appearance.  Post-Procedure: - Patient tolerated the procedure well  Assessment & Plan:  85 year old female with a history of high-grade T1, Tis of the right bladder neck dx 09/2014 followed by induction BCG x 6, and most recently  maintenance BCG completed 04/2015 and 07/2015.  She returns today for surveillance cystoscopy.   1. Malignant neoplasm of bladder neck Cystoscopy today negative  Cytology sent today Agreed to continue to abstain from BCG in the absence of recurrence and above history of recurrent gluteal abscess formation Continue q6 cystoscopy  Return in about 6 months (around 04/30/2019) for cysto.     Hollice Espy, MD  [facility] [place], [place]  [place], [place] [address] [phone]

## 2018-11-03 ENCOUNTER — Other Ambulatory Visit: Payer: Self-pay | Admitting: Urology

## 2018-11-21 ENCOUNTER — Telehealth: Payer: Self-pay | Admitting: *Deleted

## 2018-11-21 NOTE — Telephone Encounter (Signed)
Patient answer No to all pre screening COVD19 questions.

## 2018-11-23 NOTE — Progress Notes (Deleted)
[facility]  [phone]  [phone]     YESENNIA HIROTA DOB: 20-Aug-1932  MR#: [id number]  KVQ#:[id number]  Patient Care Team: Kirk Ruths, MD as PCP - General (Internal Medicine) Hollice Espy, MD as Consulting Physician (Urology) Kirk Ruths, MD (Internal Medicine) Christene Lye, MD (General Surgery) Hooten, Laurice Record, MD (Orthopedic Surgery)  CHIEF COMPLAINT:  Iron deficiency anemia, unspecified. MGUS.  INTERVAL HISTORY: Patient returns to clinic today for repeat laboratory can further evaluation.  She currently feels well and is asymptomatic.  She does not complain of back pain or joint pain today.  She has no neurologic complaints. She denies any recent fevers or illnesses. She has no chest pain or shortness of breath.  She denies any nausea, vomiting, constipation, or diarrhea. She has noted no melena or hematochezia. She has no urinary complaints.  Patient feels at her baseline offers no specific complaints today.  REVIEW OF SYSTEMS:   Review of Systems  Constitutional: Negative.  Negative for fever, malaise/fatigue and weight loss.  HENT: Negative.  Negative for congestion and sinus pain.   Respiratory: Negative.  Negative for cough and shortness of breath.   Cardiovascular: Negative.  Negative for chest pain and leg swelling.  Gastrointestinal: Negative.  Negative for abdominal pain, blood in stool and melena.  Genitourinary: Negative.  Negative for dysuria.  Musculoskeletal: Negative.  Negative for back pain and joint pain.  Skin: Negative.  Negative for rash.  Neurological: Negative.  Negative for dizziness, tingling, weakness and headaches.  Psychiatric/Behavioral: Negative.  The patient is not nervous/anxious and does not have insomnia.     As per HPI. Otherwise, a complete review of systems is negative.   PAST MEDICAL HISTORY: Past Medical History:  Diagnosis Date  . Anemia   . Anemia   . Bladder  cancer ([place])   . Cancer [facility]) 2016   bladder tumor  . Carotid arterial disease ([place]) 01/09/2014   Overview:  Minimal on screening   . Chronic low back pain   . Colon polyp   . DDD (degenerative disc disease), cervical   . DDD (degenerative disc disease), lumbar   . Elevated lipids   . Erosive esophagitis   . Foot pain   . GERD (gastroesophageal reflux disease)   . Glaucoma   . H/O concussion 04/2012   Due to fall at [facility]  . Heart murmur   . History of bone density study   . History of chickenpox   . HOH (hard of hearing)    Bilateral  Hearing Aids  . Hypertension   . Lower back pain   . Lower extremity edema   . Multinodular thyroid   . Multiple gastric ulcers   . Osteoporosis   . Personal history of chemotherapy    bladder  . 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[place])   . Thyroid disease     PAST SURGICAL HISTORY: Past Surgical History:  Procedure Laterality Date  . ABDOMINAL HYSTERECTOMY  1973  . APPLICATION OF WOUND VAC Right 01/17/2017   Procedure: APPLICATION OF WOUND VAC;  Surgeon: Hessie Knows, MD;  Location: [facility] ORS;  Service: Orthopedics;  Laterality: Right;  . BACK SURGERY     03/1975-ruptured disk, 11/1983-ruptured disk, 11/1984-ruptured disk, 10/1996-ruptured disk, 08/1997-spinal fusion, 02/10/07-spinal fusion [facility]  . BACK SURGERY     Spinal Fusion X 2  . CARPAL TUNNEL RELEASE Right   . CATARACT EXTRACTION Bilateral   . CYSTOSCOPY W/ RETROGRADES Bilateral 09/29/2014   Procedure:  CYSTOSCOPY WITH RETROGRADE PYELOGRAM;  Surgeon: Hollice Espy, MD;  Location: [facility] ORS;  Service: Urology;  Laterality: Bilateral;  . CYSTOSCOPY WITH BIOPSY N/A 11/02/2014   Procedure: CYSTOSCOPY WITH BIOPSY/WITH MITOMYCIN;  Surgeon: Hollice Espy, MD;  Location: [facility] ORS;  Service: Urology;  Laterality: N/A;  . ESOPHAGOGASTRODUODENOSCOPY (EGD) WITH PROPOFOL N/A 05/10/2015   Procedure: ESOPHAGOGASTRODUODENOSCOPY (EGD) WITH PROPOFOL;  Surgeon: Lollie Sails, MD;  Location: [facility]  ENDOSCOPY;  Service: Endoscopy;  Laterality: N/A;  . ESOPHAGOGASTRODUODENOSCOPY (EGD) WITH PROPOFOL N/A 01/08/2018   Procedure: ESOPHAGOGASTRODUODENOSCOPY (EGD) WITH PROPOFOL;  Surgeon: Toledo, [name], MD;  Location: [facility] ENDOSCOPY;  Service: Gastroenterology;  Laterality: N/A;  . EYE SURGERY Bilateral    Cataract Extraction with IOL  . FOOT SURGERY Right   . INCISION AND DRAINAGE ABSCESS Right 11/20/2016   Procedure: INCISION AND DRAINAGE ABSCESS GLUTEAL;  Surgeon: Christene Lye, MD;  Location: [facility] ORS;  Service: General;  Laterality: Right;  . INCISION AND DRAINAGE ABSCESS Right 01/17/2017   Procedure: INCISION AND DRAINAGE GLUTEAL ABSCESS;  Surgeon: Hessie Knows, MD;  Location: [facility] ORS;  Service: Orthopedics;  Laterality: Right;  . IRRIGATION AND DEBRIDEMENT BUTTOCKS Right 10/21/2015   Procedure: DRAINAGE GLUTEAL ABSCESS;  Surgeon: Christene Lye, MD;  Location: [facility] ORS;  Service: General;  Laterality: Right;  . KNEE ARTHROPLASTY Left 01/10/2018   Procedure: COMPUTER ASSISTED TOTAL KNEE ARTHROPLASTY;  Surgeon: Dereck Leep, MD;  Location: [facility] ORS;  Service: Orthopedics;  Laterality: Left;  . KNEE ARTHROPLASTY Right 06/16/2018   Procedure: COMPUTER ASSISTED TOTAL KNEE ARTHROPLASTY;  Surgeon: Dereck Leep, MD;  Location: [facility] ORS;  Service: Orthopedics;  Laterality: Right;  . KNEE ARTHROSCOPY Right    x2  . MANDIBLE SURGERY Bilateral    x2  . PICC LINE INSERTION Right 01/08/2017  . SHOULDER ARTHROSCOPY WITH ROTATOR CUFF REPAIR AND SUBACROMIAL DECOMPRESSION Right 05/25/2015   Procedure: SHOULDER ARTHROSCOPY WITH ROTATOR CUFF REPAIR AND SUBACROMIAL DECOMPRESSION, release long head biceps tendon;  Surgeon: Leanor Kail, MD;  Location: [facility] ORS;  Service: Orthopedics;  Laterality: Right;  . TRANSURETHRAL RESECTION OF BLADDER TUMOR N/A 09/29/2014   Procedure: TRANSURETHRAL RESECTION OF BLADDER TUMOR (TURBT);  Surgeon: Hollice Espy, MD;  Location: [facility] ORS;  Service:  Urology;  Laterality: N/A;  . WRIST FRACTURE SURGERY Left     FAMILY HISTORY Family History  Problem Relation Age of Onset  . Heart attack Father   . Emphysema Father   . Heart disease Mother   . Hypertension Mother   . Diabetes type II Sister   . Kidney disease Neg Hx   . Bladder Cancer Neg Hx   . Breast cancer Neg Hx     GYNECOLOGIC HISTORY:  No LMP recorded. Patient has had a hysterectomy.     ADVANCED DIRECTIVES:    HEALTH MAINTENANCE: Social History   Tobacco Use  . Smoking status: Never Smoker  . Smokeless tobacco: Never Used  Substance Use Topics  . Alcohol use: No  . Drug use: No   Allergies  Allergen Reactions  . Tramadol Nausea And Vomiting    Current Outpatient Medications  Medication Sig Dispense Refill  . calcium-vitamin D (OSCAL WITH D) 500-200 MG-UNIT tablet Take 1 tablet by mouth daily.    . carvedilol (COREG) 12.5 MG tablet Take 12.5 mg by mouth 2 (two) times daily with a meal.    . Cholecalciferol (VITAMIN D3) 25 MCG (1000 UT) CAPS Take 1,000 Units by mouth daily.    [name] diltiazem (CARDIZEM CD) 240 MG  24 hr capsule Take 240 mg by mouth daily.    [name] latanoprost (XALATAN) 0.005 % ophthalmic solution Place 1 drop into both eyes at bedtime.    . pravastatin (PRAVACHOL) 40 MG tablet Take 40 mg by mouth at bedtime.     . raloxifene (EVISTA) 60 MG tablet Take 60 mg by mouth daily.    . traMADol (ULTRAM) 50 MG tablet Take 1-2 tablets (50-100 mg total) by mouth every 4 (four) hours as needed for moderate pain. 60 tablet 0  . vitamin B-12 (CYANOCOBALAMIN) 1000 MCG tablet Take 2,000 mcg by mouth daily.     No current facility-administered medications for this visit.     OBJECTIVE: There were no vitals taken for this visit.   There is no height or weight on file to calculate BMI.    ECOG FS:1 - Symptomatic but completely ambulatory  General: Well-developed, well-nourished, no acute distress. Eyes: Pink conjunctiva, anicteric sclera. HEENT: Normocephalic,  moist mucous membranes. Lungs: Clear to auscultation bilaterally. Heart: Regular rate and rhythm. No rubs, murmurs, or gallops. Abdomen: Soft, nontender, nondistended. No organomegaly noted, normoactive bowel sounds. Musculoskeletal: No edema, cyanosis, or clubbing. Neuro: Alert, answering all questions appropriately. Cranial nerves grossly intact. Skin: No rashes or petechiae noted. Psych: Normal affect.  LAB RESULTS:  CBC    Component Value Date/Time   WBC 5.3 06/03/2018 1057   RBC 3.72 (L) 06/03/2018 1057   HGB 12.3 06/03/2018 1057   HGB 8.2 (L) 09/05/2015 0900   HCT 38.6 06/03/2018 1057   HCT 27.6 (L) 09/05/2015 0900   PLT 232 06/03/2018 1057   PLT 389 (H) 09/05/2015 0900   MCV 103.8 (H) 06/03/2018 1057   MCV 83 09/05/2015 0900   MCH 33.1 06/03/2018 1057   MCHC 31.9 06/03/2018 1057   RDW 12.0 06/03/2018 1057   RDW 17.6 (H) 09/05/2015 0900   LYMPHSABS 1.7 04/14/2018 1259   LYMPHSABS 1.8 09/05/2015 0900   MONOABS 0.6 04/14/2018 1259   EOSABS 0.1 04/14/2018 1259   EOSABS 0.1 09/05/2015 0900   BASOSABS 0.0 04/14/2018 1259   BASOSABS 0.0 09/05/2015 0900   Lab Results  Component Value Date   IRON 86 04/14/2018   TIBC 274 04/14/2018   IRONPCTSAT 31 04/14/2018   Lab Results  Component Value Date   FERRITIN 277 04/14/2018   Lab Results  Component Value Date   TOTALPROTELP 6.5 04/14/2018   ALBUMINELP 3.6 04/14/2018   A1GS 0.2 04/14/2018   A2GS 0.7 04/14/2018   BETS 0.9 04/14/2018   GAMS 1.1 04/14/2018   MSPIKE 0.7 (H) 04/14/2018   SPEI Comment 04/14/2018     STUDIES: No results found.  ASSESSMENT: Iron deficiency anemia, MGUS.  PLAN:   1. Iron deficiency anemia: Patient's hemoglobin is 11.9 today which is unchanged and essentially within normal limits.  Iron stores continue to be within normal limits as well. She does not require additional Feraheme today.  She last received IV Feraheme on May 16, 2017.  Patient has been instructed to continue her oral  iron supplementation.  No intervention is needed at this time.  Return to clinic in 6 months with repeat laboratory work and further evaluation.   2. MGUS: Patient is most recent M spike on August 14, 2017 was reported 0.9, today's result is pending. This appears to be approximately her baseline ranging from 0.7-1.1.  Her IgG levels are normal, but she does have a suppressed IgA and IgM.  Kappa free light chains are also mildly elevated at 95.4.  She has no evidence of endorgan damage.  Repeat laboratory work in 6 months and if everything remains stable, can consider discharging patient to clinic. 3. Superficial bladder cancer: Continue follow-up and treatment per urology. 4.  Elevated ferritin: Resolved.  Patient expressed understanding and was in agreement with this plan. She also understands that She can call clinic at any time with any questions, concerns, or complaints.    Lloyd Huger, MD 11/23/18 10:19 AM

## 2018-11-24 ENCOUNTER — Other Ambulatory Visit: Payer: Self-pay

## 2018-11-24 ENCOUNTER — Inpatient Hospital Stay: Payer: Medicare Other | Attending: Oncology

## 2018-11-24 DIAGNOSIS — Z79899 Other long term (current) drug therapy: Secondary | ICD-10-CM | POA: Insufficient documentation

## 2018-11-24 DIAGNOSIS — D472 Monoclonal gammopathy: Secondary | ICD-10-CM | POA: Diagnosis not present

## 2018-11-24 DIAGNOSIS — I10 Essential (primary) hypertension: Secondary | ICD-10-CM | POA: Insufficient documentation

## 2018-11-24 DIAGNOSIS — D509 Iron deficiency anemia, unspecified: Secondary | ICD-10-CM | POA: Diagnosis not present

## 2018-11-24 DIAGNOSIS — Z8551 Personal history of malignant neoplasm of bladder: Secondary | ICD-10-CM | POA: Insufficient documentation

## 2018-11-24 LAB — BASIC METABOLIC PANEL
Anion gap: 9 (ref 5–15)
BUN: 25 mg/dL — ABNORMAL HIGH (ref 8–23)
CO2: 24 mmol/L (ref 22–32)
Calcium: 9.1 mg/dL (ref 8.9–10.3)
Chloride: 109 mmol/L (ref 98–111)
Creatinine, Ser: 0.84 mg/dL (ref 0.44–1.00)
GFR calc Af Amer: >60 mL/min (ref >60)
GFR calc non Af Amer: >60 mL/min (ref >60)
Glucose, Bld: 126 mg/dL — ABNORMAL HIGH (ref 70–99)
Potassium: 4.1 mmol/L (ref 3.5–5.1)
Sodium: 142 mmol/L (ref 135–145)

## 2018-11-24 LAB — CBC WITH DIFFERENTIAL/PLATELET
Abs Immature Granulocytes: 0.01 10*3/uL (ref 0.00–0.07)
Basophils Absolute: 0 10*3/uL (ref 0.0–0.1)
Basophils Relative: 1 %
Eosinophils Absolute: 0.1 10*3/uL (ref 0.0–0.5)
Eosinophils Relative: 2 %
HCT: 36.1 % (ref 36.0–46.0)
Hemoglobin: 11.9 g/dL — ABNORMAL LOW (ref 12.0–15.0)
Immature Granulocytes: 0 %
Lymphocytes Relative: 31 %
Lymphs Abs: 1.3 10*3/uL (ref 0.7–4.0)
MCH: 33.2 pg (ref 26.0–34.0)
MCHC: 33 g/dL (ref 30.0–36.0)
MCV: 100.8 fL — ABNORMAL HIGH (ref 80.0–100.0)
Monocytes Absolute: 0.5 10*3/uL (ref 0.1–1.0)
Monocytes Relative: 12 %
Neutro Abs: 2.2 10*3/uL (ref 1.7–7.7)
Neutrophils Relative %: 54 %
Platelets: 158 10*3/uL (ref 150–400)
RBC: 3.58 MIL/uL — ABNORMAL LOW (ref 3.87–5.11)
RDW: 12.4 % (ref 11.5–15.5)
WBC: 4.1 10*3/uL (ref 4.0–10.5)
nRBC: 0 % (ref 0.0–0.2)

## 2018-11-25 LAB — PROTEIN ELECTROPHORESIS, SERUM
A/G Ratio: 1.3 (ref 0.7–1.7)
Albumin ELP: 3.8 g/dL (ref 2.9–4.4)
Alpha-1-Globulin: 0.3 g/dL (ref 0.0–0.4)
Alpha-2-Globulin: 0.7 g/dL (ref 0.4–1.0)
Beta Globulin: 0.7 g/dL (ref 0.7–1.3)
Gamma Globulin: 1.3 g/dL (ref 0.4–1.8)
Globulin, Total: 3 g/dL (ref 2.2–3.9)
M-Spike, %: 0.8 g/dL — ABNORMAL HIGH
Total Protein ELP: 6.8 g/dL (ref 6.0–8.5)

## 2018-11-25 LAB — IGG, IGA, IGM
IgA: 19 mg/dL — ABNORMAL LOW (ref 64–422)
IgG (Immunoglobin G), Serum: 1388 mg/dL (ref 586–1602)
IgM (Immunoglobulin M), Srm: 24 mg/dL — ABNORMAL LOW (ref 26–217)

## 2018-11-25 LAB — KAPPA/LAMBDA LIGHT CHAINS
Kappa free light chain: 102.6 mg/L — ABNORMAL HIGH (ref 3.3–19.4)
Kappa, lambda light chain ratio: 14.87 — ABNORMAL HIGH (ref 0.26–1.65)
Lambda free light chains: 6.9 mg/L (ref 5.7–26.3)

## 2018-11-27 ENCOUNTER — Ambulatory Visit: Payer: Medicare Other

## 2018-11-27 ENCOUNTER — Other Ambulatory Visit: Payer: Self-pay

## 2018-11-27 ENCOUNTER — Ambulatory Visit: Payer: Medicare Other | Admitting: Oncology

## 2018-11-29 NOTE — Progress Notes (Signed)
[place]  [phone]) [phone]  Fax:([phone]) [phone]     [name] DOB: 01/28/1933  MR#: [id number]  [id number]#:[id number]  Patient Care Team: Kirk Ruths, MD as PCP - General (Internal Medicine) Hollice Espy, MD as Consulting Physician (Urology) Kirk Ruths, MD (Internal Medicine) Christene Lye, MD (General Surgery) Hooten, Laurice Record, MD (Orthopedic Surgery)  CHIEF COMPLAINT:  Iron deficiency anemia, unspecified. MGUS.  INTERVAL HISTORY: Patient returns to clinic today for repeat laboratory work and routine 53-monthfollow-up.  She continues to feel well and remains asymptomatic, although she does report occasional weakness and fatigue. She has no neurologic complaints. She denies any recent fevers or illnesses.  She denies any chest pain, shortness of breath, cough, or hemoptysis.  She denies any nausea, vomiting, constipation, or diarrhea. She has noted no melena or hematochezia. She has no urinary complaints.  Patient offers no specific complaints today.  REVIEW OF SYSTEMS:   Review of Systems  Constitutional: Negative.  Negative for fever, malaise/fatigue and weight loss.  HENT: Negative.  Negative for congestion and sinus pain.   Respiratory: Negative.  Negative for cough and shortness of breath.   Cardiovascular: Negative.  Negative for chest pain and leg swelling.  Gastrointestinal: Negative.  Negative for abdominal pain, blood in stool and melena.  Genitourinary: Negative.  Negative for dysuria.  Musculoskeletal: Negative.  Negative for back pain and joint pain.  Skin: Negative.  Negative for rash.  Neurological: Negative.  Negative for dizziness, tingling, weakness and headaches.  Psychiatric/Behavioral: Negative.  The patient is not nervous/anxious and does not have insomnia.     As per HPI. Otherwise, a complete review of systems is negative.   PAST MEDICAL HISTORY: Past Medical History:  Diagnosis Date  . Anemia   .  Anemia   . [place]   . Cancer [facility] 2016   bladder tumor  . [place]id arterial [place] 01[place]   Overview:  Minimal on screening   . Chronic low back pain   . Colon polyp   . DDD (degenerative disc disease), cervical   . DDD (degenerative disc disease), lumbar   . Elevated lipids   . Erosive esophagitis   . Foot pain   . GERD (gastroesophageal reflux disease)   . Glaucoma   . H/O concussion 04/2012   [facility] Heart murmur   . History of bone density study   . History of chickenpox   . HOH (hard of hearing)    Bilateral  Hearing Aids  . Hypertension   . Lower back pain   . Lower extremity edema   . Multinodular thyroid   . Multiple gastric ulcers   . Osteoporosis   . Personal history of chemotherapy    bladder  . 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   . Thyroid disease     PAST SURGICAL HISTORY: Past Surgical History:  Procedure Laterality Date  . ABDOMINAL HYSTERECTOMY  1973  . APPLICATION OF WOUND VAC Right 01/17/2017   Procedure: APPLICATION OF WOUND VAC;  Surgeon: MHessie Knows MD;  Location: [facility] ORS;  Service: Orthopedics;  Laterality: Right;  . BACK SURGERY     03/1975-ruptured disk, 11/1983-ruptured disk, 11/1984-ruptured disk, 10/1996-ruptured disk, 08/1997-spinal fusion, 02/10/07-spinal fusion [facility]  . BACK SURGERY     Spinal Fusion X 2  . CARPAL TUNNEL RELEASE Right   . CATARACT EXTRACTION Bilateral   . CYSTOSCOPY W/ RETROGRADES Bilateral 09/29/2014   Procedure: CYSTOSCOPY WITH RETROGRADE  PYELOGRAM;  Surgeon: Hollice Espy, MD;  Location: [facility] ORS;  Service: Urology;  Laterality: Bilateral;  . CYSTOSCOPY WITH BIOPSY N/A 11/02/2014   Procedure: CYSTOSCOPY WITH BIOPSY/WITH MITOMYCIN;  Surgeon: Hollice Espy, MD;  Location: [facility] ORS;  Service: Urology;  Laterality: N/A;  . ESOPHAGOGASTRODUODENOSCOPY (EGD) WITH PROPOFOL N/A 05/10/2015   Procedure: ESOPHAGOGASTRODUODENOSCOPY (EGD) WITH PROPOFOL;  Surgeon: Lollie Sails,  MD;  Location: [facility] ENDOSCOPY;  Service: Endoscopy;  Laterality: N/A;  . ESOPHAGOGASTRODUODENOSCOPY (EGD) WITH PROPOFOL N/A 01/08/2018   Procedure: ESOPHAGOGASTRODUODENOSCOPY (EGD) WITH PROPOFOL;  Surgeon: Toledo, [name], MD;  Location: [facility] ENDOSCOPY;  Service: Gastroenterology;  Laterality: N/A;  . EYE SURGERY Bilateral    Cataract Extraction with IOL  . FOOT SURGERY Right   . INCISION AND DRAINAGE ABSCESS Right 11/20/2016   Procedure: INCISION AND DRAINAGE ABSCESS GLUTEAL;  Surgeon: Christene Lye, MD;  Location: [facility] ORS;  Service: General;  Laterality: Right;  . INCISION AND DRAINAGE ABSCESS Right 01/17/2017   Procedure: INCISION AND DRAINAGE GLUTEAL ABSCESS;  Surgeon: Hessie Knows, MD;  Location: [facility] ORS;  Service: Orthopedics;  Laterality: Right;  . IRRIGATION AND DEBRIDEMENT BUTTOCKS Right 10/21/2015   Procedure: DRAINAGE GLUTEAL ABSCESS;  Surgeon: Christene Lye, MD;  Location: [facility] ORS;  Service: General;  Laterality: Right;  . KNEE ARTHROPLASTY Left 01/10/2018   Procedure: COMPUTER ASSISTED TOTAL KNEE ARTHROPLASTY;  Surgeon: Dereck Leep, MD;  Location: [facility] ORS;  Service: Orthopedics;  Laterality: Left;  . KNEE ARTHROPLASTY Right 06/16/2018   Procedure: COMPUTER ASSISTED TOTAL KNEE ARTHROPLASTY;  Surgeon: Dereck Leep, MD;  Location: [facility] ORS;  Service: Orthopedics;  Laterality: Right;  . KNEE ARTHROSCOPY Right    x2  . MANDIBLE SURGERY Bilateral    x2  . PICC LINE INSERTION Right 01/08/2017  . SHOULDER ARTHROSCOPY WITH ROTATOR CUFF REPAIR AND SUBACROMIAL DECOMPRESSION Right 05/25/2015   Procedure: SHOULDER ARTHROSCOPY WITH ROTATOR CUFF REPAIR AND SUBACROMIAL DECOMPRESSION, release long head biceps tendon;  Surgeon: Leanor Kail, MD;  Location: [facility] ORS;  Service: Orthopedics;  Laterality: Right;  . TRANSURETHRAL RESECTION OF BLADDER TUMOR N/A 09/29/2014   Procedure: TRANSURETHRAL RESECTION OF BLADDER TUMOR (TURBT);  Surgeon: Hollice Espy, MD;  Location:  [facility] ORS;  Service: Urology;  Laterality: N/A;  . WRIST FRACTURE SURGERY Left     FAMILY HISTORY Family History  Problem Relation Age of Onset  . Heart attack Father   . Emphysema Father   . Heart disease Mother   . Hypertension Mother   . Diabetes type II Sister   . Kidney disease Neg Hx   . Bladder Cancer Neg Hx   . Breast cancer Neg Hx     GYNECOLOGIC HISTORY:  No LMP recorded. Patient has had a hysterectomy.     ADVANCED DIRECTIVES:    HEALTH MAINTENANCE: Social History   Tobacco Use  . Smoking status: Never Smoker  . Smokeless tobacco: Never Used  Substance Use Topics  . Alcohol use: No  . Drug use: No   Allergies  Allergen Reactions  . Tramadol Nausea And Vomiting    Current Outpatient Medications  Medication Sig Dispense Refill  . calcium-vitamin D (OSCAL WITH D) 500-200 MG-UNIT tablet Take 1 tablet by mouth daily.    . carvedilol (COREG) 12.5 MG tablet Take 12.5 mg by mouth 2 (two) times daily with a meal.    . Cholecalciferol (VITAMIN D3) 25 MCG (1000 UT) CAPS Take 1,000 Units by mouth daily.    [name] diltiazem (CARDIZEM CD) 240 MG 24 hr capsule  Take 240 mg by mouth daily.    [name] latanoprost (XALATAN) 0.005 % ophthalmic solution Place 1 drop into both eyes at bedtime.    . pravastatin (PRAVACHOL) 40 MG tablet Take 40 mg by mouth at bedtime.     . raloxifene (EVISTA) 60 MG tablet Take 60 mg by mouth daily.    . vitamin B-12 (CYANOCOBALAMIN) 1000 MCG tablet Take 2,000 mcg by mouth daily.     No current facility-administered medications for this visit.     OBJECTIVE: BP (!) 174/84 (BP Location: Left Arm, Patient Position: Sitting)   Pulse 69   Temp (!) 96.4 F (35.8 C) (Tympanic)   Ht '5\' 1"'  (1.549 m)   Wt 155 lb 14.4 oz (70.7 kg)   BMI 29.46 kg/m    Body mass index is 29.46 kg/m.    ECOG FS:0 - Asymptomatic  General: Well-developed, well-nourished, no acute distress. Eyes: Pink conjunctiva, anicteric sclera. HEENT: Normocephalic, moist mucous  membranes. Lungs: Clear to auscultation bilaterally. Heart: Regular rate and rhythm. No rubs, murmurs, or gallops. Abdomen: Soft, nontender, nondistended. No organomegaly noted, normoactive bowel sounds. Musculoskeletal: No edema, cyanosis, or clubbing. Neuro: Alert, answering all questions appropriately. Cranial nerves grossly intact. Skin: No rashes or petechiae noted. Psych: Normal affect.  LAB RESULTS:  CBC    Component Value Date/Time   WBC 4.1 11/24/2018 1025   RBC 3.58 (L) 11/24/2018 1025   HGB 11.9 (L) 11/24/2018 1025   HGB 8.2 (L) 09/05/2015 0900   HCT 36.1 11/24/2018 1025   HCT 27.6 (L) 09/05/2015 0900   PLT 158 11/24/2018 1025   PLT 389 (H) 09/05/2015 0900   MCV 100.8 (H) 11/24/2018 1025   MCV 83 09/05/2015 0900   MCH 33.2 11/24/2018 1025   MCHC 33.0 11/24/2018 1025   RDW 12.4 11/24/2018 1025   RDW 17.6 (H) 09/05/2015 0900   LYMPHSABS 1.3 11/24/2018 1025   LYMPHSABS 1.8 09/05/2015 0900   MONOABS 0.5 11/24/2018 1025   EOSABS 0.1 11/24/2018 1025   EOSABS 0.1 09/05/2015 0900   BASOSABS 0.0 11/24/2018 1025   BASOSABS 0.0 09/05/2015 0900   Lab Results  Component Value Date   IRON 86 04/14/2018   TIBC 274 04/14/2018   IRONPCTSAT 31 04/14/2018   Lab Results  Component Value Date   FERRITIN 277 04/14/2018   Lab Results  Component Value Date   TOTALPROTELP 6.8 11/24/2018   ALBUMINELP 3.8 11/24/2018   A1GS 0.3 11/24/2018   A2GS 0.7 11/24/2018   BETS 0.7 11/24/2018   GAMS 1.3 11/24/2018   MSPIKE 0.8 (H) 11/24/2018   SPEI Comment 11/24/2018     STUDIES: No results found.  ASSESSMENT: Iron deficiency anemia, MGUS.  PLAN:   1. Iron deficiency anemia: Patient's hemoglobin remained stable at 11.9 and her iron stores continue to be within normal limits.  She does not require additional Feraheme today.  She last received IV Feraheme on May 16, 2017.  Continue oral iron supplementation.  After lengthy discussion with the patient, is agreed upon that no  further follow-up is necessary.  Please refer patient back if there are any questions or concerns. 2. MGUS: Patient's most recent M spike on November 21, 2018 was reported at 0.8 which is chronic and unchanged.  Her IgG levels continue to be within normal limits with only mildly suppressed IgA and IgM.  Kappa free light chains are mildly elevated at 102.6, but this is also relatively unchanged.  Patient has no evidence of endorgan damage.  She is at  very low risk of progression to multiple myeloma over the next 10 years.  Given her advanced age, there is no need to continue to monitor.  No follow-up has been scheduled as above.   3. Superficial bladder cancer: Continue follow-up and treatment per urology. 4.  Elevated ferritin: Resolved.  Patient expressed understanding and was in agreement with this plan. She also understands that She can call clinic at any time with any questions, concerns, or complaints.    Lloyd Huger, MD 12/04/18 6:20 AM

## 2018-12-01 ENCOUNTER — Other Ambulatory Visit: Payer: Self-pay

## 2018-12-01 ENCOUNTER — Encounter: Payer: Self-pay | Admitting: Oncology

## 2018-12-01 ENCOUNTER — Inpatient Hospital Stay: Payer: Medicare Other

## 2018-12-01 ENCOUNTER — Inpatient Hospital Stay: Payer: Medicare Other | Attending: Oncology | Admitting: Oncology

## 2018-12-01 VITALS — BP 174/84 | HR 69 | Temp 96.4°F | Ht 61.0 in | Wt 155.9 lb

## 2018-12-01 DIAGNOSIS — Z79899 Other long term (current) drug therapy: Secondary | ICD-10-CM | POA: Insufficient documentation

## 2018-12-01 DIAGNOSIS — D472 Monoclonal gammopathy: Secondary | ICD-10-CM | POA: Diagnosis present

## 2018-12-01 DIAGNOSIS — I10 Essential (primary) hypertension: Secondary | ICD-10-CM | POA: Diagnosis not present

## 2018-12-01 DIAGNOSIS — D509 Iron deficiency anemia, unspecified: Secondary | ICD-10-CM | POA: Diagnosis not present

## 2018-12-01 DIAGNOSIS — Z8551 Personal history of malignant neoplasm of bladder: Secondary | ICD-10-CM

## 2018-12-01 DIAGNOSIS — Z803 Family history of malignant neoplasm of breast: Secondary | ICD-10-CM | POA: Diagnosis not present

## 2018-12-01 DIAGNOSIS — E079 Disorder of thyroid, unspecified: Secondary | ICD-10-CM | POA: Insufficient documentation

## 2018-12-01 NOTE — Progress Notes (Signed)
Patient stated that she had been feeling tired and weak at times. Patient had not noticed any blood loss.

## 2019-01-13 ENCOUNTER — Ambulatory Visit (INDEPENDENT_AMBULATORY_CARE_PROVIDER_SITE_OTHER): Payer: Medicare Other | Admitting: Vascular Surgery

## 2019-01-13 ENCOUNTER — Encounter (INDEPENDENT_AMBULATORY_CARE_PROVIDER_SITE_OTHER): Payer: Self-pay | Admitting: Vascular Surgery

## 2019-01-13 ENCOUNTER — Other Ambulatory Visit: Payer: Self-pay

## 2019-01-13 ENCOUNTER — Encounter (INDEPENDENT_AMBULATORY_CARE_PROVIDER_SITE_OTHER): Payer: Self-pay

## 2019-01-13 ENCOUNTER — Ambulatory Visit (INDEPENDENT_AMBULATORY_CARE_PROVIDER_SITE_OTHER): Payer: Medicare Other

## 2019-01-13 VITALS — BP 147/73 | HR 69 | Resp 12 | Ht 61.0 in | Wt 158.0 lb

## 2019-01-13 DIAGNOSIS — I83893 Varicose veins of bilateral lower extremities with other complications: Secondary | ICD-10-CM

## 2019-01-13 DIAGNOSIS — I83892 Varicose veins of left lower extremities with other complications: Secondary | ICD-10-CM

## 2019-01-13 DIAGNOSIS — I83891 Varicose veins of right lower extremities with other complications: Secondary | ICD-10-CM | POA: Diagnosis not present

## 2019-01-13 DIAGNOSIS — N183 Chronic kidney disease, stage 3 unspecified: Secondary | ICD-10-CM

## 2019-01-13 DIAGNOSIS — I129 Hypertensive chronic kidney disease with stage 1 through stage 4 chronic kidney disease, or unspecified chronic kidney disease: Secondary | ICD-10-CM | POA: Diagnosis not present

## 2019-01-13 NOTE — Assessment & Plan Note (Signed)
blood pressure control important in reducing the progression of atherosclerotic disease. On appropriate oral medications.  

## 2019-01-13 NOTE — Patient Instructions (Signed)
Nonsurgical Procedures for Varicose Veins Various nonsurgical procedures can be used to treat varicose veins. Varicose veins are swollen, twisted veins that are visible under the skin. They occur most often in the legs. These veins may appear blue and bulging. Varicose veins are caused by damage to the valves in veins. All veins have a valve that makes blood flow in only one direction. If a valve gets weak or damaged, blood can pool and cause varicose veins. You may need a procedure to treat your varicose veins if they are causing symptoms or complications, or if lifestyle changes have not helped. These procedures can reduce pain, aching, and the risk of bleeding and blood clots. They can also improve the way the affected area looks (cosmetic appearance). The three common nonsurgical procedures are:  Sclerotherapy. A chemical is injected to close off a vein.  Laser treatment. Light energy is applied to close off the vein.  Radiofrequency vein ablation. Electrical energy is used to produce heat that closes off the vein. Your health care provider will discuss the method that is best for you based on your condition. Tell a health care provider about:  Any allergies you have.  All medicines you are taking, including vitamins, herbs, eye drops, creams, and over-the-counter medicines.  Any problems you or family members have had with anesthetic medicines.  Any blood disorders you have.  Any surgeries you have had.  Any medical conditions you have.  Whether you are pregnant or may be pregnant. What are the risks? Generally, this is a safe procedure. However, problems may occur, including:  Damage to nearby nerves, tissues, or veins.  Skin irritation, sores, or dark spots.  Numbness.  Clotting.  Infection.  Allergic reactions to medicines.  Scarring.  Leg swelling.  Need for additional treatments.  Bruising. What happens before the procedure?  Ask your health care provider  about: ? Changing or stopping your regular medicines. This is especially important if you are taking diabetes medicines or blood thinners. ? Taking over-the-counter medicines, vitamins, herbs, and supplements. ? Taking medicines such as aspirin and ibuprofen. These medicines can thin your blood. Do not take these medicines unless your health care provider tells you to take them.  You may have an exam or testing. This can include a tests to: ? Check for clots and check blood flow using sound waves (Doppler ultrasound). ? Observe how blood flows through your veins by injecting a dye that outlines your veins on X-rays (angiogram). This test is used in rare cases. What happens during the procedure? One of the following procedures will be performed: Sclerotherapy This procedure is often used for small to medium veins.  A chemical (sclerosant) that irritates the lining of the vein will be injected into the vein. This will cause the varicose vein to be closed off. Sclerosants in different amounts and strengths can be used, depending on the size and location of the vein.  All of the varicose vein sites will be injected. You may need more than one treatment because new varicose veins may develop, or more than one injection may be needed for each varicose vein.  Laser treatment There are two ways that lasers are used to treat varicose veins:  Light energy from a laser may be directed onto the vein through the skin.  A needle may be used to pass a thin laser catheter into the vein to cause it to close. You may need more than one treatment if the vein re-opens. In some cases,   laser treatment may be combined with sclerotherapy. Radiofrequency vein ablation   You will be given a medicine that numbs the area (local anesthetic).  A small incision will be made near the varicose vein.  A thin tube (catheter) will be threaded into your vein.  The tip of the catheter will deploy electrodes.  The  electrodes will deliver electrical energy to produce heat that closes off the vein. What happens after the procedure?  A bandage (dressing) may be used to cover the injection site or incisions.  You may have to wear compression stockings. These stockings help to prevent blood clots and reduce swelling in your legs.  Return to your normal activities as told by your health care provider. Summary  Varicose veins are swollen, twisted veins that are visible under the skin. They occur most often in the legs.  Various procedures can be used to treat varicose veins. You may need a procedure to treat your varicose veins if they are causing symptoms or complications, or if lifestyle changes have not helped.  Your health care provider will discuss the method that is best for you based on your condition. This information is not intended to replace advice given to you by your health care provider. Make sure you discuss any questions you have with your health care provider. Document Released: 08/24/2016 Document Revised: 09/05/2018 Document Reviewed: 08/24/2016 Elsevier Patient Education  2020 [employer]  

## 2019-01-13 NOTE — Progress Notes (Signed)
Patient ID: [name], female   DOB: 1932-08-27, 85 y.o.   MRN: [id number]  Chief Complaint  Patient presents with  . Follow-up    HPI [name] is a 85 y.o. female.  Patient returns in follow up of their venous disease.  They have done their best to comply with the prescribed conservative therapies of compression stockings, leg elevation, exercise, and still requires anti-inflammatories for discomfort and has symptoms that are persistent and bothersome on a daily basis, affecting their activities of daily living and normal activities.  Her swelling remains very prominent with the left leg being worse than the right.  The venous reflux study demonstrates bilateral great saphenous vein reflux with no evidence of DVT or superficial thrombophlebitis.    Past Medical History:  Diagnosis Date  . Anemia   . Anemia   . Bladder cancer ([place])   . Cancer [facility]) 2016   bladder tumor  . Carotid arterial disease ([place]) 01/09/2014   Overview:  Minimal on screening   . Chronic low back pain   . Colon polyp   . DDD (degenerative disc disease), cervical   . DDD (degenerative disc disease), lumbar   . Elevated lipids   . Erosive esophagitis   . Foot pain   . GERD (gastroesophageal reflux disease)   . Glaucoma   . H/O concussion 04/2012   Due to fall [facility]  . Heart murmur   . History of bone density study   . History of chickenpox   . HOH (hard of hearing)    Bilateral  Hearing Aids  . Hypertension   . Lower back pain   . Lower extremity edema   . Multinodular thyroid   . Multiple gastric ulcers   . Osteoporosis   . Personal history of chemotherapy    bladder  . 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[place])   . Thyroid disease     Past Surgical History:  Procedure Laterality Date  . ABDOMINAL HYSTERECTOMY  1973  . APPLICATION OF WOUND VAC Right 01/17/2017   Procedure: APPLICATION OF WOUND VAC;  Surgeon: Hessie Knows, MD;  Location: [facility] ORS;  Service:  Orthopedics;  Laterality: Right;  . BACK SURGERY     03/1975-ruptured disk, 11/1983-ruptured disk, 11/1984-ruptured disk, 10/1996-ruptured disk, 08/1997-spinal fusion, 02/10/07-spinal fusion [facility]  . BACK SURGERY     Spinal Fusion X 2  . CARPAL TUNNEL RELEASE Right   . CATARACT EXTRACTION Bilateral   . CYSTOSCOPY W/ RETROGRADES Bilateral 09/29/2014   Procedure: CYSTOSCOPY WITH RETROGRADE PYELOGRAM;  Surgeon: Hollice [place], MD;  Location: [facility] ORS;  Service: Urology;  Laterality: Bilateral;  . CYSTOSCOPY WITH BIOPSY N/A 11/02/2014   Procedure: CYSTOSCOPY WITH BIOPSY/WITH MITOMYCIN;  Surgeon: Hollice [place], MD;  Location: [facility] ORS;  Service: Urology;  Laterality: N/A;  . ESOPHAGOGASTRODUODENOSCOPY (EGD) WITH PROPOFOL N/A 05/10/2015   Procedure: ESOPHAGOGASTRODUODENOSCOPY (EGD) WITH PROPOFOL;  Surgeon: Lollie Sails, MD;  Location: [facility] ENDOSCOPY;  Service: Endoscopy;  Laterality: N/A;  . ESOPHAGOGASTRODUODENOSCOPY (EGD) WITH PROPOFOL N/A 01/08/2018   Procedure: ESOPHAGOGASTRODUODENOSCOPY (EGD) WITH PROPOFOL;  Surgeon: Toledo, [name], MD;  Location: [facility] ENDOSCOPY;  Service: Gastroenterology;  Laterality: N/A;  . EYE SURGERY Bilateral    Cataract Extraction with IOL  . FOOT SURGERY Right   . INCISION AND DRAINAGE ABSCESS Right 11/20/2016   Procedure: INCISION AND DRAINAGE ABSCESS GLUTEAL;  Surgeon: Christene Lye, MD;  Location: [facility] ORS;  Service: General;  Laterality: Right;  . INCISION AND DRAINAGE ABSCESS Right  01/17/2017   Procedure: INCISION AND DRAINAGE GLUTEAL ABSCESS;  Surgeon: Hessie Knows, MD;  Location: [facility] ORS;  Service: Orthopedics;  Laterality: Right;  . IRRIGATION AND DEBRIDEMENT BUTTOCKS Right 10/21/2015   Procedure: DRAINAGE GLUTEAL ABSCESS;  Surgeon: Christene Lye, MD;  Location: [facility] ORS;  Service: General;  Laterality: Right;  . KNEE ARTHROPLASTY Left 01/10/2018   Procedure: COMPUTER ASSISTED TOTAL KNEE ARTHROPLASTY;  Surgeon: Dereck Leep, MD;   Location: [facility] ORS;  Service: Orthopedics;  Laterality: Left;  . KNEE ARTHROPLASTY Right 06/16/2018   Procedure: COMPUTER ASSISTED TOTAL KNEE ARTHROPLASTY;  Surgeon: Dereck Leep, MD;  Location: [facility] ORS;  Service: Orthopedics;  Laterality: Right;  . KNEE ARTHROSCOPY Right    x2  . MANDIBLE SURGERY Bilateral    x2  . PICC LINE INSERTION Right 01/08/2017  . SHOULDER ARTHROSCOPY WITH ROTATOR CUFF REPAIR AND SUBACROMIAL DECOMPRESSION Right 05/25/2015   Procedure: SHOULDER ARTHROSCOPY WITH ROTATOR CUFF REPAIR AND SUBACROMIAL DECOMPRESSION, release long head biceps tendon;  Surgeon: Leanor Kail, MD;  Location: [facility] ORS;  Service: Orthopedics;  Laterality: Right;  . TRANSURETHRAL RESECTION OF BLADDER TUMOR N/A 09/29/2014   Procedure: TRANSURETHRAL RESECTION OF BLADDER TUMOR (TURBT);  Surgeon: Hollice [place], MD;  Location: [facility] ORS;  Service: Urology;  Laterality: N/A;  . WRIST FRACTURE SURGERY Left     Family History  Problem Relation Age of Onset  . Heart attack Father   . Emphysema Father   . Heart disease Mother   . Hypertension Mother   . Diabetes type II Sister   . Kidney disease Neg Hx   . Bladder Cancer Neg Hx   . Breast cancer Neg Hx     Social History Social History   Tobacco Use  . Smoking status: Never Smoker  . Smokeless tobacco: Never Used  Substance Use Topics  . Alcohol use: No  . Drug use: No     Allergies  Allergen Reactions  . Tramadol Nausea And Vomiting    Current Outpatient Medications  Medication Sig Dispense Refill  . calcium-vitamin D (OSCAL WITH D) 500-200 MG-UNIT tablet Take 1 tablet by mouth daily.    . carvedilol (COREG) 12.5 MG tablet Take 12.5 mg by mouth 2 (two) times daily with a meal.    . Cholecalciferol (VITAMIN D3) 25 MCG (1000 UT) CAPS Take 1,000 Units by mouth daily.    [name] diltiazem (CARDIZEM CD) 240 MG 24 hr capsule Take 240 mg by mouth daily.    [name] latanoprost (XALATAN) 0.005 % ophthalmic solution Place 1 drop into both eyes at  bedtime.    . pravastatin (PRAVACHOL) 40 MG tablet Take 40 mg by mouth at bedtime.     . raloxifene (EVISTA) 60 MG tablet Take 60 mg by mouth daily.    . vitamin B-12 (CYANOCOBALAMIN) 1000 MCG tablet Take 2,000 mcg by mouth daily.     No current facility-administered medications for this visit.       REVIEW OF SYSTEMS (Negative unless checked)  Constitutional: [] Weight loss  [] Fever  [] Chills Cardiac: [] Chest pain   [] Chest pressure   [] Palpitations   [] Shortness of breath when laying flat   [] Shortness of breath at rest   [] Shortness of breath with exertion. Vascular:  [] Pain in legs with walking   [] Pain in legs at rest   [] Pain in legs when laying flat   [] Claudication   [] Pain in feet when walking  [] Pain in feet at rest  [] Pain in feet when laying flat   [] History of  DVT   [] Phlebitis   [x] Swelling in legs   [x] Varicose veins   [] Non-healing ulcers Pulmonary:   [] Uses home oxygen   [] Productive cough   [] Hemoptysis   [] Wheeze  [] COPD   [] Asthma Neurologic:  [] Dizziness  [] Blackouts   [] Seizures   [] History of stroke   [] History of TIA  [] Aphasia   [] Temporary blindness   [] Dysphagia   [] Weakness or numbness in arms   [] Weakness or numbness in legs Musculoskeletal:  [x] Arthritis   [] Joint swelling   [x] Joint pain   [] Low back pain Hematologic:  [] Easy bruising  [] Easy bleeding   [] Hypercoagulable state   [] Anemic  [] Hepatitis Gastrointestinal:  [] Blood in stool   [] Vomiting blood  [x] Gastroesophageal reflux/heartburn   [] Abdominal pain Genitourinary:  [] Chronic kidney disease   [] Difficult urination  [] Frequent urination  [] Burning with urination   [] Hematuria Skin:  [] Rashes   [] Ulcers   [] Wounds Psychological:  [] History of anxiety   []  History of major depression.    Physical Exam BP (!) 147/73 (BP Location: Left Wrist, Patient Position: Sitting, Cuff Size: Normal)   Pulse 69   Resp 12   Ht 5\' 1"  (1.549 m)   Wt 158 lb (71.7 kg)   BMI 29.85 kg/m  Gen:  WD/WN, NAD.  Appears  younger than stated age Head: [place]/AT, No temporalis wasting.  Ear/Nose/Throat: Hearing grossly intact, dentition good Eyes: Sclera non-icteric. Conjunctiva clear Neck: Supple. Trachea midline Pulmonary:  Good air movement, no use of accessory muscles, respirations not labored.  Cardiac: RRR, No JVD Vascular: Varicosities extensive and measuring up to 3-4 mm in the right lower extremity        Varicosities extensive and measuring up to 3-4 mm in the left lower extremity Vessel Right Left  Radial Palpable Palpable                          PT  trace palpable  not palpable  DP  1+ palpable  1+ palpable    Musculoskeletal: M/S 5/5 throughout.   1 + RLE edema.  2 + LLE edema.  Moderate to severe stasis dermatitis changes present bilaterally Neurologic: Sensation grossly intact in extremities.  Symmetrical.  Speech is fluent.  Psychiatric: Judgment intact, Mood & affect appropriate for pt's clinical situation. Dermatologic: No rashes or ulcers noted.  No cellulitis or open wounds.  Radiology No results found.  Labs Recent Results (from the past 2160 hour(s))  Urinalysis, Complete     Status: Abnormal   Collection Time: 10/29/18 11:40 AM  Result Value Ref Range   Specific Gravity, UA 1.020 1.005 - 1.030   pH, UA 7.0 5.0 - 7.5   Color, UA Yellow Yellow   Appearance Ur Clear Clear   Leukocytes,UA 1+ (A) Negative   Protein,UA Trace (A) Negative/Trace   Glucose, UA Negative Negative   Ketones, UA Negative Negative   RBC, UA Trace (A) Negative   Bilirubin, UA Negative Negative   Urobilinogen, Ur 0.2 0.2 - 1.0 mg/dL   Nitrite, UA Negative Negative   Microscopic Examination See below:   Microscopic Examination     Status: Abnormal   Collection Time: 10/29/18 11:40 AM   URINE  Result Value Ref Range   WBC, UA 0-5 0 - 5 /hpf   RBC 0-2 0 - 2 /hpf   Epithelial Cells (non renal) 0-10 0 - 10 /hpf   Renal Epithel, UA 0-10 (A) None seen /hpf   Bacteria, UA None seen None seen/Few  Protein electrophoresis, serum     Status: Abnormal   Collection Time: 11/24/18 10:25 AM  Result Value Ref Range   Total Protein ELP 6.8 6.0 - 8.5 g/dL   Albumin ELP 3.8 2.9 - 4.4 g/dL   Alpha-1-Globulin 0.3 0.0 - 0.4 g/dL   Alpha-2-Globulin 0.7 0.4 - 1.0 g/dL   Beta Globulin 0.7 0.7 - 1.3 g/dL   Gamma Globulin 1.3 0.4 - 1.8 g/dL   M-Spike, % 0.8 (H) Not Observed g/dL   SPE Interp. Comment     Comment: (NOTE) The SPE pattern demonstrates a single peak (M-spike) in the gamma region which may represent monoclonal protein. This peak may also be caused by circulating immune complexes, cryoglobulins, C-reactive protein, fibrinogen or hemolysis.  If clinically indicated, the presence of a monoclonal gammopathy may be confirmed by immuno- fixation, as well as an evaluation of the urine for the presence of Bence-Jones protein. Performed At: [facility] [place], [place] [id number] Rush Farmer MD WU:[id number]    Comment Comment     Comment: (NOTE) Protein electrophoresis scan will follow via computer, mail, or courier delivery.    Globulin, Total 3.0 2.2 - 3.9 g/dL   A/G Ratio 1.3 0.7 - 1.7  IgG, IgA, IgM     Status: Abnormal   Collection Time: 11/24/18 10:25 AM  Result Value Ref Range   IgG (Immunoglobin G), Serum 1,388 586 - 1,602 mg/dL   IgA 19 (L) 64 - 422 mg/dL    Comment: Result confirmed on concentration.   IgM (Immunoglobulin M), Srm 24 (L) 26 - 217 mg/dL    Comment: (NOTE) Result confirmed on concentration. Performed At: [facility] [place], [place] [id number] Rush Farmer MD VH:[id number]   Kappa/lambda light chains     Status: Abnormal   Collection Time: 11/24/18 10:25 AM  Result Value Ref Range   Kappa free light chain 102.6 (H) 3.3 - 19.4 mg/L   Lamda free light chains 6.9 5.7 - 26.3 mg/L   Kappa, lamda light chain ratio 14.87 (H) 0.26 - 1.65    Comment: (NOTE) Performed At: [facility] [place], [place] [id number] Rush Farmer MD UV:[id number]   Basic metabolic panel     Status: Abnormal   Collection Time: 11/24/18 10:25 AM  Result Value Ref Range   Sodium 142 135 - 145 mmol/L   Potassium 4.1 3.5 - 5.1 mmol/L   Chloride 109 98 - 111 mmol/L   CO2 24 22 - 32 mmol/L   Glucose, Bld 126 (H) 70 - 99 mg/dL   BUN 25 (H) 8 - 23 mg/dL   Creatinine, Ser 0.84 0.44 - 1.00 mg/dL   Calcium 9.1 8.9 - 10.3 mg/dL   GFR calc non Af Amer >60 >60 mL/min   GFR calc Af Amer >60 >60 mL/min   Anion gap 9 5 - 15    Comment: Performed at [facility], [address] [place], [place] [address]  CBC with Differential     Status: Abnormal   Collection Time: 11/24/18 10:25 AM  Result Value Ref Range   WBC 4.1 4.0 - 10.5 K/uL   RBC 3.58 (L) 3.87 - 5.11 MIL/uL   Hemoglobin 11.9 (L) 12.0 - 15.0 g/dL   HCT 36.1 36.0 - 46.0 %   MCV 100.8 (H) 80.0 - 100.0 fL   MCH 33.2 26.0 - 34.0 pg   MCHC 33.0 30.0 - 36.0 g/dL   RDW 12.4 11.5 - 15.5 %  Platelets 158 150 - 400 K/uL   nRBC 0.0 0.0 - 0.2 %   Neutrophils Relative % 54 %   Neutro Abs 2.2 1.7 - 7.7 K/uL   Lymphocytes Relative 31 %   Lymphs Abs 1.3 0.7 - 4.0 K/uL   Monocytes Relative 12 %   Monocytes Absolute 0.5 0.1 - 1.0 K/uL   Eosinophils Relative 2 %   Eosinophils Absolute 0.1 0.0 - 0.5 K/uL   Basophils Relative 1 %   Basophils Absolute 0.0 0.0 - 0.1 K/uL   Immature Granulocytes 0 %   Abs Immature Granulocytes 0.01 0.00 - 0.07 K/uL    Comment: Performed at [facility], [address] [place], [place] [address]    Assessment/Plan:  Benign hypertension with CKD (chronic kidney disease) stage III (HCC) blood pressure control important in reducing the progression of atherosclerotic disease. On appropriate oral medications.   Varicose veins of leg with swelling, bilateral   The patient has done their best to comply with conservative therapy of 20-30 mm Hg compression stockings, leg elevation, exercise, and  anti-inflammatories as needed for discomfort.  Despite this, they continue to have daily and persistent symptoms from their venous disease.  A venous reflux study demonstrates bilateral GSV reflux.  As such, the patient is likely to benefit from endovenous laser ablation of the great saphenous veins bilaterally in a staged fashion.  Risks and benefits of the procedure including bleeding, infection, recanalization, DVT, and need for further therapy for residual varicosities were discussed.  The patient voices their understanding and is agreeable to proceed with bilateral GSV laser ablation.     Leotis Pain 01/13/2019, 2:29 PM

## 2019-02-04 ENCOUNTER — Other Ambulatory Visit: Payer: Self-pay | Admitting: Internal Medicine

## 2019-02-04 DIAGNOSIS — Z1231 Encounter for screening mammogram for malignant neoplasm of breast: Secondary | ICD-10-CM

## 2019-02-24 IMAGING — DX DG KNEE 1-2V PORT*L*
2 series · 2 of 2 positions shown · non-contrast
Comparison: None.

CLINICAL DATA: Status post total knee replacement

EXAM:
PORTABLE LEFT KNEE - 1-2 VIEW

[knee ap]
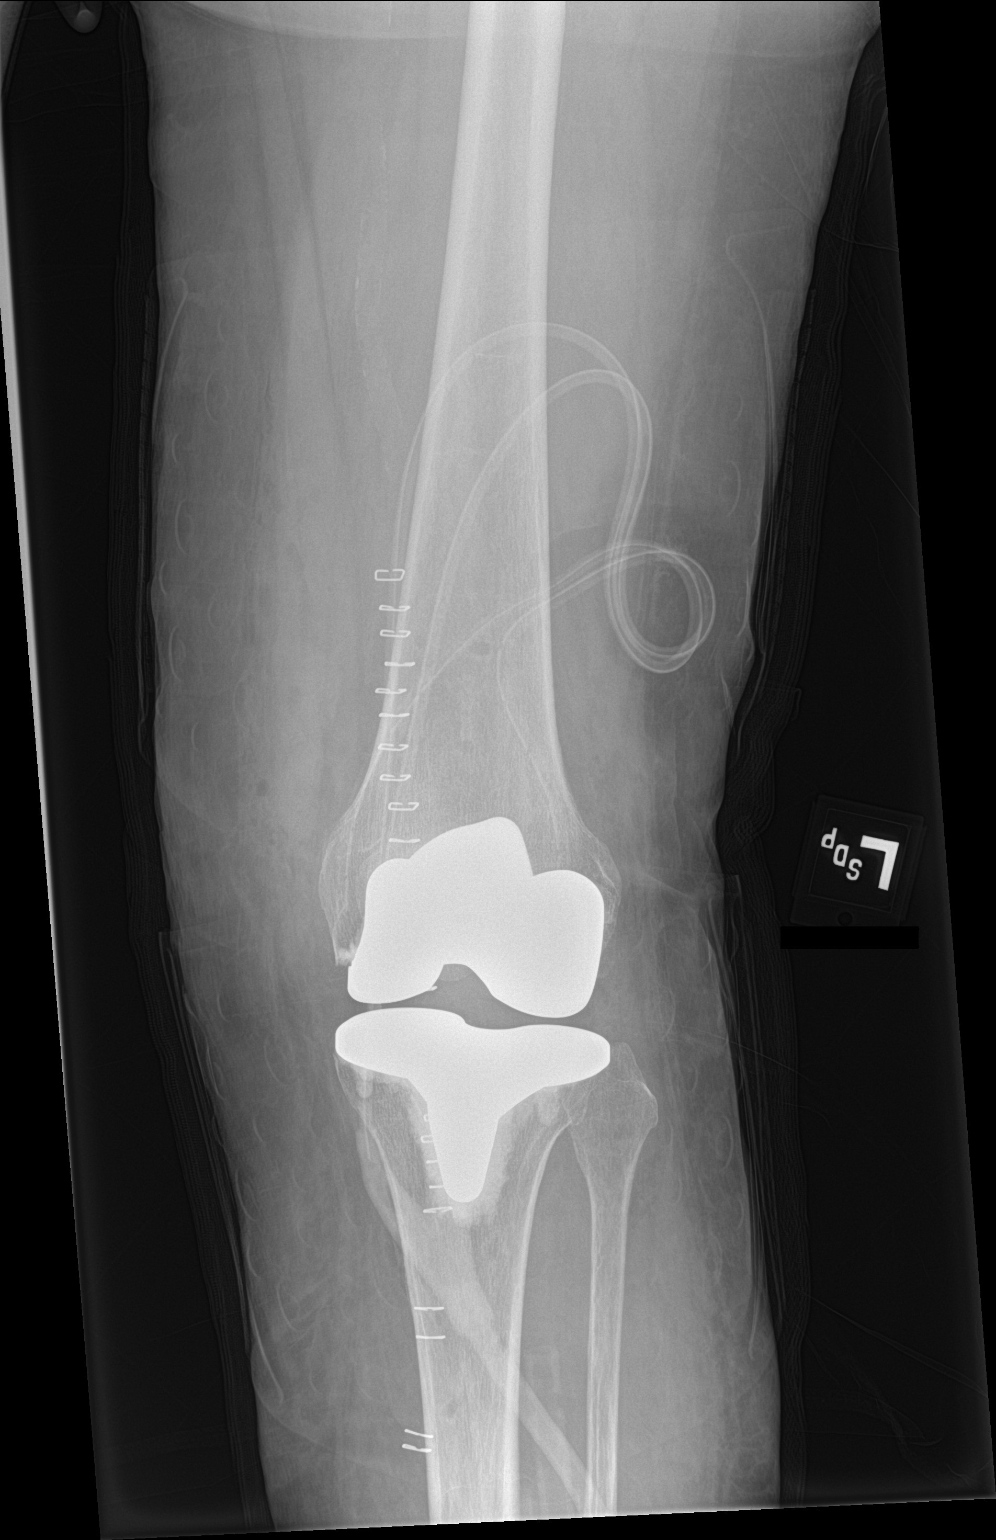

[knee lat]
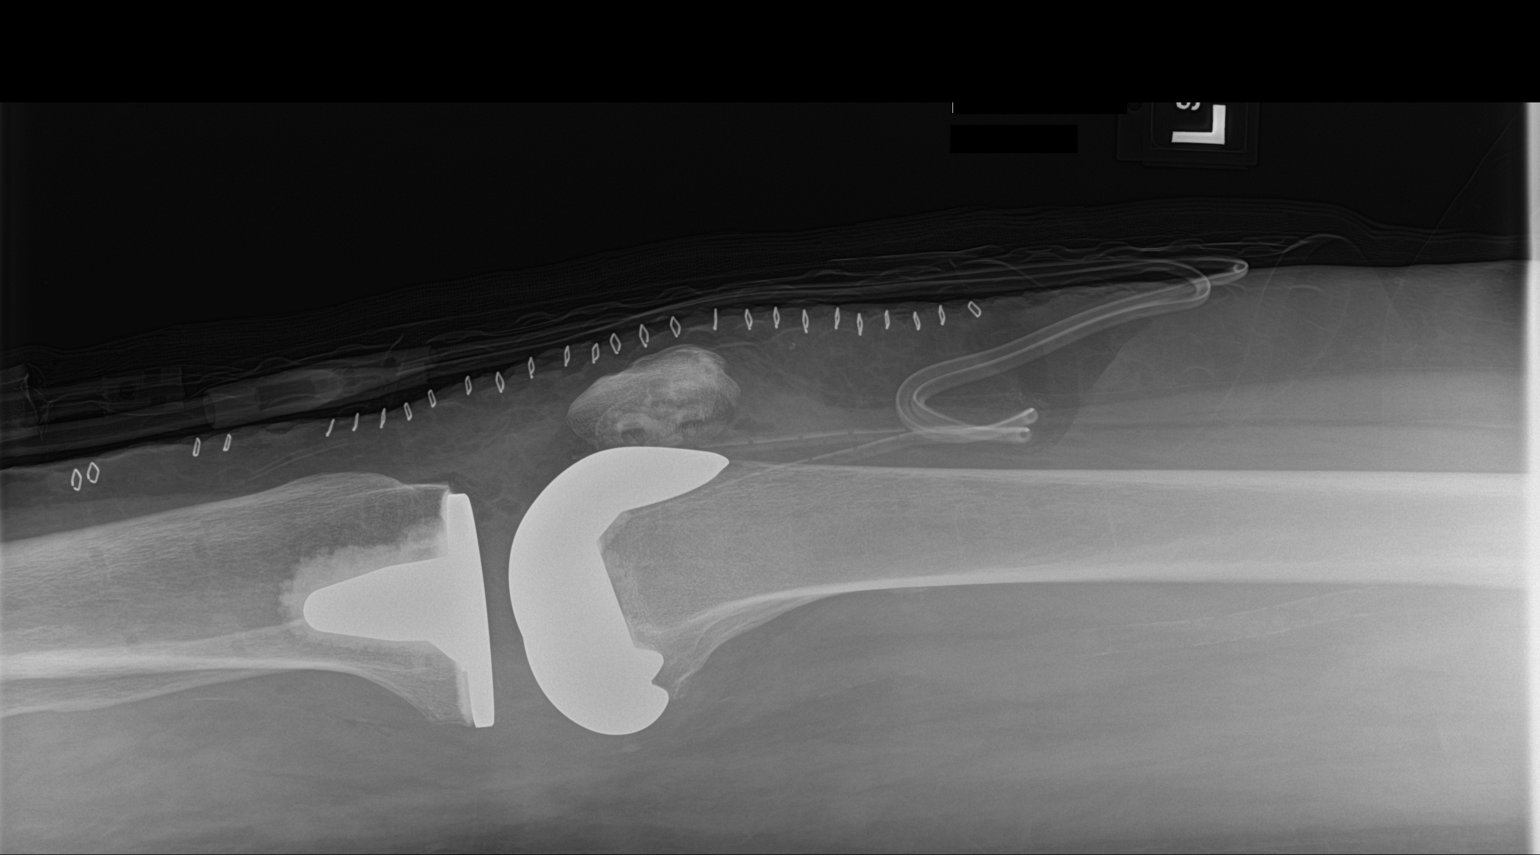

[2 of 2 positions shown; findings below may reference images not displayed]

FINDINGS: Frontal and lateral views were obtained. There is evidence of total
knee replacement with femoral and tibial prosthetic components
well-seated. No fracture or dislocation. There is a small joint
effusion. There is a drain in the knee joint. There are overlying
skin staples.
IMPRESSION: Status post total knee replacement with femoral and tibial
prosthetic components well-seated. No fracture or dislocation. Small
joint effusion.

## 2019-02-27 ENCOUNTER — Other Ambulatory Visit: Payer: Self-pay

## 2019-02-27 ENCOUNTER — Ambulatory Visit (INDEPENDENT_AMBULATORY_CARE_PROVIDER_SITE_OTHER): Payer: Medicare Other | Admitting: Vascular Surgery

## 2019-02-27 ENCOUNTER — Encounter (INDEPENDENT_AMBULATORY_CARE_PROVIDER_SITE_OTHER): Payer: Self-pay | Admitting: Vascular Surgery

## 2019-02-27 VITALS — BP 145/78 | HR 59 | Resp 16 | Wt 158.0 lb

## 2019-02-27 DIAGNOSIS — I83892 Varicose veins of left lower extremities with other complications: Secondary | ICD-10-CM | POA: Diagnosis not present

## 2019-02-27 DIAGNOSIS — I83893 Varicose veins of bilateral lower extremities with other complications: Secondary | ICD-10-CM

## 2019-02-27 NOTE — Progress Notes (Signed)
[name] is a 85 y.o. female who presents with symptomatic venous reflux  Past Medical History:  Diagnosis Date  . Anemia   . Anemia   . Bladder cancer ([place])   . Cancer [facility]) 2016   bladder tumor  . Carotid arterial disease ([place]) 01/09/2014   Overview:  Minimal on screening   . Chronic low back pain   . Colon polyp   . DDD (degenerative disc disease), cervical   . DDD (degenerative disc disease), lumbar   . Elevated lipids   . Erosive esophagitis   . Foot pain   . GERD (gastroesophageal reflux disease)   . Glaucoma   . H/O concussion 04/2012   Due to fall at [facility]  . Heart murmur   . History of bone density study   . History of chickenpox   . HOH (hard of hearing)    Bilateral  Hearing Aids  . Hypertension   . Lower back pain   . Lower extremity edema   . Multinodular thyroid   . Multiple gastric ulcers   . Osteoporosis   . Personal history of chemotherapy    bladder  . 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[place])   . Thyroid disease     Past Surgical History:  Procedure Laterality Date  . ABDOMINAL HYSTERECTOMY  1973  . APPLICATION OF WOUND VAC Right 01/17/2017   Procedure: APPLICATION OF WOUND VAC;  Surgeon: Hessie Knows, MD;  Location: [facility] ORS;  Service: Orthopedics;  Laterality: Right;  . BACK SURGERY     03/1975-ruptured disk, 11/1983-ruptured disk, 11/1984-ruptured disk, 10/1996-ruptured disk, 08/1997-spinal fusion, 02/10/07-spinal fusion [facility]  . BACK SURGERY     Spinal Fusion X 2  . CARPAL TUNNEL RELEASE Right   . CATARACT EXTRACTION Bilateral   . CYSTOSCOPY W/ RETROGRADES Bilateral 09/29/2014   Procedure: CYSTOSCOPY WITH RETROGRADE PYELOGRAM;  Surgeon: Hollice [place], MD;  Location: [facility] ORS;  Service: Urology;  Laterality: Bilateral;  . CYSTOSCOPY WITH BIOPSY N/A 11/02/2014   Procedure: CYSTOSCOPY WITH BIOPSY/WITH MITOMYCIN;  Surgeon: Hollice [place]y, MD;  Location: [facility] ORS;  Service: Urology;  Laterality: N/A;  . ESOPHAGOGASTRODUODENOSCOPY (EGD)  WITH PROPOFOL N/A 05/10/2015   Procedure: ESOPHAGOGASTRODUODENOSCOPY (EGD) WITH PROPOFOL;  Surgeon: Lollie Sails, MD;  Location: [facility] ENDOSCOPY;  Service: Endoscopy;  Laterality: N/A;  . ESOPHAGOGASTRODUODENOSCOPY (EGD) WITH PROPOFOL N/A 01/08/2018   Procedure: ESOPHAGOGASTRODUODENOSCOPY (EGD) WITH PROPOFOL;  Surgeon: Toledo, [name], MD;  Location: [facility] ENDOSCOPY;  Service: Gastroenterology;  Laterality: N/A;  . EYE SURGERY Bilateral    Cataract Extraction with IOL  . FOOT SURGERY Right   . INCISION AND DRAINAGE ABSCESS Right 11/20/2016   Procedure: INCISION AND DRAINAGE ABSCESS GLUTEAL;  Surgeon: Christene Lye, MD;  Location: [facility] ORS;  Service: General;  Laterality: Right;  . INCISION AND DRAINAGE ABSCESS Right 01/17/2017   Procedure: INCISION AND DRAINAGE GLUTEAL ABSCESS;  Surgeon: Hessie Knows, MD;  Location: [facility] ORS;  Service: Orthopedics;  Laterality: Right;  . IRRIGATION AND DEBRIDEMENT BUTTOCKS Right 10/21/2015   Procedure: DRAINAGE GLUTEAL ABSCESS;  Surgeon: Christene Lye, MD;  Location: [facility] ORS;  Service: General;  Laterality: Right;  . KNEE ARTHROPLASTY Left 01/10/2018   Procedure: COMPUTER ASSISTED TOTAL KNEE ARTHROPLASTY;  Surgeon: Dereck Leep, MD;  Location: [facility] ORS;  Service: Orthopedics;  Laterality: Left;  . KNEE ARTHROPLASTY Right 06/16/2018   Procedure: COMPUTER ASSISTED TOTAL KNEE ARTHROPLASTY;  Surgeon: Dereck Leep, MD;  Location: [facility] ORS;  Service: Orthopedics;  Laterality: Right;  . KNEE ARTHROSCOPY  Right    x2  . MANDIBLE SURGERY Bilateral    x2  . PICC LINE INSERTION Right 01/08/2017  . SHOULDER ARTHROSCOPY WITH ROTATOR CUFF REPAIR AND SUBACROMIAL DECOMPRESSION Right 05/25/2015   Procedure: SHOULDER ARTHROSCOPY WITH ROTATOR CUFF REPAIR AND SUBACROMIAL DECOMPRESSION, release long head biceps tendon;  Surgeon: Leanor Kail, MD;  Location: [facility] ORS;  Service: Orthopedics;  Laterality: Right;  . TRANSURETHRAL RESECTION OF BLADDER TUMOR  N/A 09/29/2014   Procedure: TRANSURETHRAL RESECTION OF BLADDER TUMOR (TURBT);  Surgeon: Hollice [place], MD;  Location: [facility] ORS;  Service: Urology;  Laterality: N/A;  . WRIST FRACTURE SURGERY Left      Current Outpatient Medications:  .  calcium-vitamin D (OSCAL WITH D) 500-200 MG-UNIT tablet, Take 1 tablet by mouth daily., Disp: , Rfl:  .  carvedilol (COREG) 12.5 MG tablet, Take 12.5 mg by mouth 2 (two) times daily with a meal., Disp: , Rfl:  .  Cholecalciferol (VITAMIN D3) 25 MCG (1000 UT) CAPS, Take 1,000 Units by mouth daily., Disp: , Rfl:  .  diltiazem (CARDIZEM CD) 240 MG 24 hr capsule, Take 240 mg by mouth daily., Disp: , Rfl:  .  latanoprost (XALATAN) 0.005 % ophthalmic solution, Place 1 drop into both eyes at bedtime., Disp: , Rfl:  .  pravastatin (PRAVACHOL) 40 MG tablet, Take 40 mg by mouth at bedtime. , Disp: , Rfl:  .  raloxifene (EVISTA) 60 MG tablet, Take 60 mg by mouth daily., Disp: , Rfl:  .  vitamin B-12 (CYANOCOBALAMIN) 1000 MCG tablet, Take 2,000 mcg by mouth daily., Disp: , Rfl:   Allergies  Allergen Reactions  . Tramadol Nausea And Vomiting     Varicose veins of leg with swelling, bilateral     PLAN: The patient's left lower extremity was sterilely prepped and draped. The ultrasound machine was used to visualize the saphenous vein throughout its course. A segment in the GSV at the level of the knee was selected for access. The saphenous vein was accessed without difficulty using ultrasound guidance with a micropuncture needle. A 0.018 wire was then placed beyond the saphenofemoral junction and the needle was removed. The 65 cm sheath was then placed over the wire and the wire and dilator were removed. The laser fiber was then placed through the sheath and its tip was placed approximately 4 centimeters below the saphenofemoral junction. Tumescent anesthesia was then created with a dilute lidocaine solution. Laser energy was then delivered with constant withdrawal of the  sheath and laser fiber. Approximately 1231 joules of energy were delivered over a length of 28 centimeters using a 1470 Hz VenaCure machine at 7 W. Sterile dressings were placed. The patient tolerated the procedure well without obvious complications.   Follow-up in 1 week with post-laser duplex.

## 2019-03-02 ENCOUNTER — Other Ambulatory Visit (INDEPENDENT_AMBULATORY_CARE_PROVIDER_SITE_OTHER): Payer: Medicare Other

## 2019-03-02 ENCOUNTER — Other Ambulatory Visit: Payer: Self-pay

## 2019-03-02 DIAGNOSIS — I83893 Varicose veins of bilateral lower extremities with other complications: Secondary | ICD-10-CM

## 2019-03-12 ENCOUNTER — Ambulatory Visit
Admission: RE | Admit: 2019-03-12 | Discharge: 2019-03-12 | Disposition: A | Discharge status: Home / Self Care | Payer: Medicare Other | Source: Ambulatory Visit | Attending: Internal Medicine | Admitting: Internal Medicine

## 2019-03-12 DIAGNOSIS — Z1231 Encounter for screening mammogram for malignant neoplasm of breast: Secondary | ICD-10-CM | POA: Insufficient documentation

## 2019-03-13 ENCOUNTER — Ambulatory Visit (INDEPENDENT_AMBULATORY_CARE_PROVIDER_SITE_OTHER): Payer: Medicare Other | Admitting: Vascular Surgery

## 2019-03-13 ENCOUNTER — Encounter (INDEPENDENT_AMBULATORY_CARE_PROVIDER_SITE_OTHER): Payer: Self-pay | Admitting: Vascular Surgery

## 2019-03-13 ENCOUNTER — Other Ambulatory Visit: Payer: Self-pay

## 2019-03-13 VITALS — BP 157/84 | HR 52 | Resp 16 | Wt 161.0 lb

## 2019-03-13 DIAGNOSIS — I83893 Varicose veins of bilateral lower extremities with other complications: Secondary | ICD-10-CM

## 2019-03-13 DIAGNOSIS — I83891 Varicose veins of right lower extremities with other complications: Secondary | ICD-10-CM

## 2019-03-13 NOTE — Progress Notes (Signed)
[name] is a 85 y.o. female who presents with symptomatic venous reflux  Past Medical History:  Diagnosis Date  . Anemia   . Anemia   . Bladder cancer ([place])   . Cancer [facility]) 2016   bladder tumor  . Carotid arterial disease ([place]) 01/09/2014   Overview:  Minimal on screening   . Chronic low back pain   . Colon polyp   . DDD (degenerative disc disease), cervical   . DDD (degenerative disc disease), lumbar   . Elevated lipids   . Erosive esophagitis   . Foot pain   . GERD (gastroesophageal reflux disease)   . Glaucoma   . H/O concussion 04/2012   Due to fall at [facility]  . Heart murmur   . History of bone density study   . History of chickenpox   . HOH (hard of hearing)    Bilateral  Hearing Aids  . Hypertension   . Lower back pain   . Lower extremity edema   . Multinodular thyroid   . Multiple gastric ulcers   . Osteoporosis   . Personal history of chemotherapy    bladder  . 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[place])   . Thyroid disease     Past Surgical History:  Procedure Laterality Date  . ABDOMINAL HYSTERECTOMY  1973  . APPLICATION OF WOUND VAC Right 01/17/2017   Procedure: APPLICATION OF WOUND VAC;  Surgeon: Hessie Knows, MD;  Location: [facility] ORS;  Service: Orthopedics;  Laterality: Right;  . BACK SURGERY     03/1975-ruptured disk, 11/1983-ruptured disk, 11/1984-ruptured disk, 10/1996-ruptured disk, 08/1997-spinal fusion, 02/10/07-spinal fusion [facility]  . BACK SURGERY     Spinal Fusion X 2  . CARPAL TUNNEL RELEASE Right   . CATARACT EXTRACTION Bilateral   . CYSTOSCOPY W/ RETROGRADES Bilateral 09/29/2014   Procedure: CYSTOSCOPY WITH RETROGRADE PYELOGRAM;  Surgeon: Hollice Espy, MD;  Location: [facility] ORS;  Service: Urology;  Laterality: Bilateral;  . CYSTOSCOPY WITH BIOPSY N/A 11/02/2014   Procedure: CYSTOSCOPY WITH BIOPSY/WITH MITOMYCIN;  Surgeon: Hollice Espy, MD;  Location: [facility] ORS;  Service: Urology;  Laterality: N/A;  . ESOPHAGOGASTRODUODENOSCOPY (EGD)  WITH PROPOFOL N/A 05/10/2015   Procedure: ESOPHAGOGASTRODUODENOSCOPY (EGD) WITH PROPOFOL;  Surgeon: Lollie Sails, MD;  Location: [facility] ENDOSCOPY;  Service: Endoscopy;  Laterality: N/A;  . ESOPHAGOGASTRODUODENOSCOPY (EGD) WITH PROPOFOL N/A 01/08/2018   Procedure: ESOPHAGOGASTRODUODENOSCOPY (EGD) WITH PROPOFOL;  Surgeon: Toledo, [name], MD;  Location: [facility] ENDOSCOPY;  Service: Gastroenterology;  Laterality: N/A;  . EYE SURGERY Bilateral    Cataract Extraction with IOL  . FOOT SURGERY Right   . INCISION AND DRAINAGE ABSCESS Right 11/20/2016   Procedure: INCISION AND DRAINAGE ABSCESS GLUTEAL;  Surgeon: Christene Lye, MD;  Location: [facility] ORS;  Service: General;  Laterality: Right;  . INCISION AND DRAINAGE ABSCESS Right 01/17/2017   Procedure: INCISION AND DRAINAGE GLUTEAL ABSCESS;  Surgeon: Hessie Knows, MD;  Location: [facility] ORS;  Service: Orthopedics;  Laterality: Right;  . IRRIGATION AND DEBRIDEMENT BUTTOCKS Right 10/21/2015   Procedure: DRAINAGE GLUTEAL ABSCESS;  Surgeon: Christene Lye, MD;  Location: [facility] ORS;  Service: General;  Laterality: Right;  . KNEE ARTHROPLASTY Left 01/10/2018   Procedure: COMPUTER ASSISTED TOTAL KNEE ARTHROPLASTY;  Surgeon: Dereck Leep, MD;  Location: [facility] ORS;  Service: Orthopedics;  Laterality: Left;  . KNEE ARTHROPLASTY Right 06/16/2018   Procedure: COMPUTER ASSISTED TOTAL KNEE ARTHROPLASTY;  Surgeon: Dereck Leep, MD;  Location: [facility] ORS;  Service: Orthopedics;  Laterality: Right;  . KNEE ARTHROSCOPY  Right    x2  . MANDIBLE SURGERY Bilateral    x2  . PICC LINE INSERTION Right 01/08/2017  . SHOULDER ARTHROSCOPY WITH ROTATOR CUFF REPAIR AND SUBACROMIAL DECOMPRESSION Right 05/25/2015   Procedure: SHOULDER ARTHROSCOPY WITH ROTATOR CUFF REPAIR AND SUBACROMIAL DECOMPRESSION, release long head biceps tendon;  Surgeon: Leanor Kail, MD;  Location: [facility] ORS;  Service: Orthopedics;  Laterality: Right;  . TRANSURETHRAL RESECTION OF BLADDER TUMOR  N/A 09/29/2014   Procedure: TRANSURETHRAL RESECTION OF BLADDER TUMOR (TURBT);  Surgeon: Hollice Espy, MD;  Location: [facility] ORS;  Service: Urology;  Laterality: N/A;  . WRIST FRACTURE SURGERY Left      Current Outpatient Medications:  .  calcium-vitamin D (OSCAL WITH D) 500-200 MG-UNIT tablet, Take 1 tablet by mouth daily., Disp: , Rfl:  .  carvedilol (COREG) 12.5 MG tablet, Take 12.5 mg by mouth 2 (two) times daily with a meal., Disp: , Rfl:  .  Cholecalciferol (VITAMIN D3) 25 MCG (1000 UT) CAPS, Take 1,000 Units by mouth daily., Disp: , Rfl:  .  diltiazem (CARDIZEM CD) 240 MG 24 hr capsule, Take 240 mg by mouth daily., Disp: , Rfl:  .  latanoprost (XALATAN) 0.005 % ophthalmic solution, Place 1 drop into both eyes at bedtime., Disp: , Rfl:  .  pravastatin (PRAVACHOL) 40 MG tablet, Take 40 mg by mouth at bedtime. , Disp: , Rfl:  .  raloxifene (EVISTA) 60 MG tablet, Take 60 mg by mouth daily., Disp: , Rfl:  .  vitamin B-12 (CYANOCOBALAMIN) 1000 MCG tablet, Take 2,000 mcg by mouth daily., Disp: , Rfl:   Allergies  Allergen Reactions  . Tramadol Nausea And Vomiting     Varicose veins of leg with swelling, bilateral     PLAN: The patient's right lower extremity was sterilely prepped and draped. The ultrasound machine was used to visualize the saphenous vein throughout its course. A segment in the mid to upper calf was selected for access. The saphenous vein was accessed without difficulty using ultrasound guidance with a micropuncture needle. A 0.018 wire was then placed beyond the saphenofemoral junction and the needle was removed. The 65 cm sheath was then placed over the wire and the wire and dilator were removed. The laser fiber was then placed through the sheath and its tip was placed approximately 4 centimeters below the saphenofemoral junction. Tumescent anesthesia was then created with a dilute lidocaine solution. Laser energy was then delivered with constant withdrawal of the sheath and  laser fiber. Approximately 1452 joules of energy were delivered over a length of 37 centimeters using a 1470 Hz VenaCure machine at 7 W. Sterile dressings were placed. The patient tolerated the procedure well without obvious complications.   Follow-up in 1 week with post-laser duplex.

## 2019-03-16 ENCOUNTER — Other Ambulatory Visit: Payer: Self-pay

## 2019-03-16 ENCOUNTER — Ambulatory Visit (INDEPENDENT_AMBULATORY_CARE_PROVIDER_SITE_OTHER): Payer: Medicare Other

## 2019-03-16 DIAGNOSIS — I83893 Varicose veins of bilateral lower extremities with other complications: Secondary | ICD-10-CM

## 2019-03-27 ENCOUNTER — Other Ambulatory Visit: Payer: Self-pay

## 2019-03-27 ENCOUNTER — Encounter (INDEPENDENT_AMBULATORY_CARE_PROVIDER_SITE_OTHER): Payer: Self-pay | Admitting: Nurse Practitioner

## 2019-03-27 ENCOUNTER — Ambulatory Visit (INDEPENDENT_AMBULATORY_CARE_PROVIDER_SITE_OTHER): Payer: Medicare Other | Admitting: Nurse Practitioner

## 2019-03-27 VITALS — BP 162/71 | HR 60 | Resp 16 | Ht 61.0 in | Wt 162.0 lb

## 2019-03-27 DIAGNOSIS — I83893 Varicose veins of bilateral lower extremities with other complications: Secondary | ICD-10-CM

## 2019-03-27 DIAGNOSIS — M5030 Other cervical disc degeneration, unspecified cervical region: Secondary | ICD-10-CM

## 2019-03-27 NOTE — Progress Notes (Signed)
SUBJECTIVE:  Patient ID: [name], female    DOB: December 16, 1932, 85 y.o.   MRN: [id number] Chief Complaint  Patient presents with  . Follow-up    HPI  [name] is a 85 y.o. female The patient returns to the office for followup status post laser ablation of the left and right great saphenous vein on 02/27/2019 and 03/13/2019. The patient notes multiple residual varicosities bilaterally which continued to hurt with dependent positions and remained tender to palpation. The patient's swelling is unchanged from preoperative status. The patient continues to wear graduated compression stockings on a daily basis but these are not eliminating the pain and discomfort. The patient continues to use over-the-counter anti-inflammatory medications to treat the pain and related symptoms but this has not given the patient relief. The patient notes the pain in the lower extremities is causing problems with daily exercise, problems at work and even with household activities such as preparing meals and doing dishes.  The patient is otherwise done well and there have been no complications related to the laser procedure or interval changes in the patient's overall   Venous ultrasound post laser shows successful laser ablation of the right and left great saphenous veins, no DVT identified.  Past Medical History:  Diagnosis Date  . Anemia   . Anemia   . Bladder cancer ([place])   . Cancer [facility]) 2016   bladder tumor  . Carotid arterial disease ([place]) 01/09/2014   Overview:  Minimal on screening   . Chronic low back pain   . Colon polyp   . DDD (degenerative disc disease), cervical   . DDD (degenerative disc disease), lumbar   . Elevated lipids   . Erosive esophagitis   . Foot pain   . GERD (gastroesophageal reflux disease)   . Glaucoma   . H/O concussion 04/2012   Due to fall at [facility]  . Heart murmur   . History of bone density study   . History of chickenpox   .  HOH (hard of hearing)    Bilateral  Hearing Aids  . Hypertension   . Lower back pain   . Lower extremity edema   . Multinodular thyroid   . Multiple gastric ulcers   . Osteoporosis   . Personal history of chemotherapy    bladder  . 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[place])   . Thyroid disease     Past Surgical History:  Procedure Laterality Date  . ABDOMINAL HYSTERECTOMY  1973  . APPLICATION OF WOUND VAC Right 01/17/2017   Procedure: APPLICATION OF WOUND VAC;  Surgeon: Hessie Knows, MD;  Location: [facility] ORS;  Service: Orthopedics;  Laterality: Right;  . BACK SURGERY     03/1975-ruptured disk, 11/1983-ruptured disk, 11/1984-ruptured disk, 10/1996-ruptured disk, 08/1997-spinal fusion, 02/10/07-spinal fusion [facility]  . BACK SURGERY     Spinal Fusion X 2  . CARPAL TUNNEL RELEASE Right   . CATARACT EXTRACTION Bilateral   . CYSTOSCOPY W/ RETROGRADES Bilateral 09/29/2014   Procedure: CYSTOSCOPY WITH RETROGRADE PYELOGRAM;  Surgeon: Hollice Espy, MD;  Location: [facility] ORS;  Service: Urology;  Laterality: Bilateral;  . CYSTOSCOPY WITH BIOPSY N/A 11/02/2014   Procedure: CYSTOSCOPY WITH BIOPSY/WITH MITOMYCIN;  Surgeon: Hollice Espy, MD;  Location: [facility] ORS;  Service: Urology;  Laterality: N/A;  . ESOPHAGOGASTRODUODENOSCOPY (EGD) WITH PROPOFOL N/A 05/10/2015   Procedure: ESOPHAGOGASTRODUODENOSCOPY (EGD) WITH PROPOFOL;  Surgeon: Lollie Sails, MD;  Location: [facility] ENDOSCOPY;  Service: Endoscopy;  Laterality: N/A;  . ESOPHAGOGASTRODUODENOSCOPY (EGD) WITH PROPOFOL N/A  01/08/2018   Procedure: ESOPHAGOGASTRODUODENOSCOPY (EGD) WITH PROPOFOL;  Surgeon: Toledo, [name], MD;  Location: [facility] ENDOSCOPY;  Service: Gastroenterology;  Laterality: N/A;  . EYE SURGERY Bilateral    Cataract Extraction with IOL  . FOOT SURGERY Right   . INCISION AND DRAINAGE ABSCESS Right 11/20/2016   Procedure: INCISION AND DRAINAGE ABSCESS GLUTEAL;  Surgeon: Christene Lye, MD;  Location: [facility] ORS;  Service: General;  Laterality: Right;  .  INCISION AND DRAINAGE ABSCESS Right 01/17/2017   Procedure: INCISION AND DRAINAGE GLUTEAL ABSCESS;  Surgeon: Hessie Knows, MD;  Location: [facility] ORS;  Service: Orthopedics;  Laterality: Right;  . IRRIGATION AND DEBRIDEMENT BUTTOCKS Right 10/21/2015   Procedure: DRAINAGE GLUTEAL ABSCESS;  Surgeon: Christene Lye, MD;  Location: [facility] ORS;  Service: General;  Laterality: Right;  . KNEE ARTHROPLASTY Left 01/10/2018   Procedure: COMPUTER ASSISTED TOTAL KNEE ARTHROPLASTY;  Surgeon: Dereck Leep, MD;  Location: [facility] ORS;  Service: Orthopedics;  Laterality: Left;  . KNEE ARTHROPLASTY Right 06/16/2018   Procedure: COMPUTER ASSISTED TOTAL KNEE ARTHROPLASTY;  Surgeon: Dereck Leep, MD;  Location: [facility] ORS;  Service: Orthopedics;  Laterality: Right;  . KNEE ARTHROSCOPY Right    x2  . MANDIBLE SURGERY Bilateral    x2  . PICC LINE INSERTION Right 01/08/2017  . SHOULDER ARTHROSCOPY WITH ROTATOR CUFF REPAIR AND SUBACROMIAL DECOMPRESSION Right 05/25/2015   Procedure: SHOULDER ARTHROSCOPY WITH ROTATOR CUFF REPAIR AND SUBACROMIAL DECOMPRESSION, release long head biceps tendon;  Surgeon: Leanor Kail, MD;  Location: [facility] ORS;  Service: Orthopedics;  Laterality: Right;  . TRANSURETHRAL RESECTION OF BLADDER TUMOR N/A 09/29/2014   Procedure: TRANSURETHRAL RESECTION OF BLADDER TUMOR (TURBT);  Surgeon: Hollice Espy, MD;  Location: [facility] ORS;  Service: Urology;  Laterality: N/A;  . WRIST FRACTURE SURGERY Left     Social History   Socioeconomic History  . Marital status: Widowed    Spouse name: Not on file  . Number of children: 3  . Years of education: Not on file  . Highest education level: Not on file  Occupational History  . Not on file  Social Needs  . Financial resource strain: Not hard at all  . Food insecurity    Worry: Never true    Inability: Never true  . Transportation needs    Medical: No    Non-medical: No  Tobacco Use  . Smoking status: Never Smoker  . Smokeless tobacco: Never  Used  Substance and Sexual Activity  . Alcohol use: No  . Drug use: No  . Sexual activity: Never  Lifestyle  . Physical activity    Days per week: 0 days    Minutes per session: 0 min  . Stress: Not at all  Relationships  . Social connections    Talks on phone: More than three times a week    Gets together: More than three times a week    Attends religious service: Not on file    Active member of club or organization: Yes    Attends meetings of clubs or organizations: More than 4 times per year    Relationship status: Widowed  . Intimate partner violence    Fear of current or ex partner: Patient refused    Emotionally abused: Patient refused    Physically abused: Patient refused    Forced sexual activity: Patient refused  Other Topics Concern  . Not on file  Social History Narrative  . Not on file    Family History  Problem Relation Age of Onset  .  Heart attack Father   . Emphysema Father   . Heart disease Mother   . Hypertension Mother   . Diabetes type II Sister   . Kidney disease Neg Hx   . Bladder Cancer Neg Hx   . Breast cancer Neg Hx     Allergies  Allergen Reactions  . Tramadol Nausea And Vomiting     Review of Systems   Review of Systems: Negative Unless Checked Constitutional: [] Weight loss  [] Fever  [] Chills Cardiac: [] Chest pain   []  Atrial Fibrillation  [] Palpitations   [] Shortness of breath when laying flat   [] Shortness of breath with exertion. [] Shortness of breath at rest Vascular:  [] Pain in legs with walking   [] Pain in legs with standing [] Pain in legs when laying flat   [] Claudication    [] Pain in feet when laying flat    [] History of DVT   [] Phlebitis   [x] Swelling in legs   [x] Varicose veins   [] Non-healing ulcers Pulmonary:   [] Uses home oxygen   [] Productive cough   [] Hemoptysis   [] Wheeze  [] COPD   [] Asthma Neurologic:  [] Dizziness   [] Seizures  [] Blackouts [] History of stroke   [] History of TIA  [] Aphasia   [] Temporary Blindness    [] Weakness or numbness in arm   [] Weakness or numbness in leg Musculoskeletal:   [] Joint swelling   [] Joint pain   [] Low back pain  []  History of Knee Replacement [x] Arthritis [] back Surgeries  []  Spinal Stenosis    Hematologic:  [] Easy bruising  [] Easy bleeding   [] Hypercoagulable state   [x] Anemic Gastrointestinal:  [] Diarrhea   [] Vomiting  [] Gastroesophageal reflux/heartburn   [] Difficulty swallowing. [] Abdominal pain Genitourinary:  [] Chronic kidney disease   [] Difficult urination  [] Anuric   [] Blood in urine [] Frequent urination  [] Burning with urination   [] Hematuria Skin:  [] Rashes   [] Ulcers [] Wounds Psychological:  [] History of anxiety   []  History of major depression  []  Memory Difficulties      OBJECTIVE:   Physical Exam  BP (!) 162/71 (BP Location: Left Arm)   Pulse 60   Resp 16   Ht 5\' 1"  (1.549 m)   Wt 162 lb (73.5 kg)   BMI 30.61 kg/m   Gen: WD/WN, NAD Head: [place]/AT, No temporalis wasting.  Ear/Nose/Throat: Hearing grossly intact, nares w/o erythema or drainage Eyes: PER, EOMI, sclera nonicteric.  Neck: Supple, no masses.  No JVD.  Pulmonary:  Good air movement, no use of accessory muscles.  Cardiac: RRR Vascular: scattered spider veins bilaterally.  Larger veins 4-5 mm present in the medial portion of the leg bilaterally.  1+ soft edema bilaterally Vessel Right Left  Radial Palpable Palpable  Dorsalis Pedis Palpable Palpable  Posterior Tibial Palpable Palpable   Gastrointestinal: soft, non-distended. No guarding/no peritoneal signs.  Musculoskeletal: M/S 5/5 throughout.  No deformity or atrophy.  Neurologic: Pain and light touch intact in extremities.  Symmetrical.  Speech is fluent. Motor exam as listed above. Psychiatric: Judgment intact, Mood & affect appropriate for pt's clinical situation. Dermatologic: No Venous rashes. No Ulcers Noted.  No changes consistent with cellulitis. Lymph : No Cervical lymphadenopathy, no lichenification or skin changes of chronic  lymphedema.       ASSESSMENT AND PLAN:  1. Symptomatic varicose veins of both lower extremities Recommend:  The patient has had successful ablation of the previously incompetent saphenous venous system but still has persistent symptoms of pain and swelling that are having a negative impact on daily life and daily activities.  Patient should undergo  injection foam sclerotherapy to treat the residual varicosities.  The risks, benefits and alternative therapies were reviewed in detail with the patient.  All questions were answered.  The patient agrees to proceed with sclerotherapy at their convenience.  The patient will continue wearing the graduated compression stockings and using the over-the-counter pain medications to treat her symptoms.       2. DDD (degenerative disc disease), cervical Continue NSAID medications as already ordered, these medications have been reviewed and there are no changes at this time.  Continued activity and therapy was stressed.    Current Outpatient Medications on File Prior to Visit  Medication Sig Dispense Refill  . calcium-vitamin D (OSCAL WITH D) 500-200 MG-UNIT tablet Take 1 tablet by mouth daily.    . carvedilol (COREG) 12.5 MG tablet Take 12.5 mg by mouth 2 (two) times daily with a meal.    . Cholecalciferol (VITAMIN D3) 25 MCG (1000 UT) CAPS Take 1,000 Units by mouth daily.    [name] diltiazem (CARDIZEM CD) 240 MG 24 hr capsule Take 240 mg by mouth daily.    [name] latanoprost (XALATAN) 0.005 % ophthalmic solution Place 1 drop into both eyes at bedtime.    . pravastatin (PRAVACHOL) 40 MG tablet Take 40 mg by mouth at bedtime.     . raloxifene (EVISTA) 60 MG tablet Take 60 mg by mouth daily.    . vitamin B-12 (CYANOCOBALAMIN) 1000 MCG tablet Take 2,000 mcg by mouth daily.     No current facility-administered medications on file prior to visit.     There are no Patient Instructions on file for this visit. No follow-ups on file.   Kris Hartmann, NP   This note was completed with Sales executive.  Any errors are purely unintentional.

## 2019-04-06 ENCOUNTER — Telehealth (INDEPENDENT_AMBULATORY_CARE_PROVIDER_SITE_OTHER): Payer: Self-pay | Admitting: Nurse Practitioner

## 2019-04-06 NOTE — Telephone Encounter (Signed)
Patient was giving medical advice and I informed the patient once insurance has approved the foam sclerotherapy she will be receiving a call from the office

## 2019-04-06 NOTE — Telephone Encounter (Signed)
Mr. Deboe did indeed see me on 03/27/2019.  She may have had difficulty realizing it was me due to the scrub cap and face mask.  Per my last note the patient should have foam sclerotherapy done bilaterally to treat the larger varicosities in her legs.  She doesn't need the appointment on 04/09/2019 because I've already seen her for the follow up on her legs.  We put in the paperwork to her insurance to have foam sclerotherapy done, so we are waiting to hear back from them.  Once we have heard from [employer] we will give her a call back to get the procedure scheduled.

## 2019-04-09 ENCOUNTER — Ambulatory Visit (INDEPENDENT_AMBULATORY_CARE_PROVIDER_SITE_OTHER): Payer: Medicare Other | Admitting: Nurse Practitioner

## 2019-04-21 ENCOUNTER — Ambulatory Visit (INDEPENDENT_AMBULATORY_CARE_PROVIDER_SITE_OTHER): Payer: Medicare Other | Admitting: Vascular Surgery

## 2019-04-28 ENCOUNTER — Other Ambulatory Visit: Payer: Self-pay

## 2019-04-28 ENCOUNTER — Ambulatory Visit (INDEPENDENT_AMBULATORY_CARE_PROVIDER_SITE_OTHER): Payer: Medicare Other | Admitting: Vascular Surgery

## 2019-04-28 VITALS — BP 187/80 | HR 61 | Resp 16 | Ht 61.0 in | Wt 159.0 lb

## 2019-04-28 DIAGNOSIS — I83892 Varicose veins of left lower extremities with other complications: Secondary | ICD-10-CM | POA: Diagnosis not present

## 2019-04-28 DIAGNOSIS — I83893 Varicose veins of bilateral lower extremities with other complications: Secondary | ICD-10-CM

## 2019-04-28 NOTE — Progress Notes (Signed)
[name] is a 85 y.o.female who presents with painful varicose veins of the left leg  Past Medical History:  Diagnosis Date  . Anemia   . Anemia   . Bladder cancer ([place])   . Cancer [facility]) 2016   bladder tumor  . Carotid arterial disease ([place]) 01/09/2014   Overview:  Minimal on screening   . Chronic low back pain   . Colon polyp   . DDD (degenerative disc disease), cervical   . DDD (degenerative disc disease), lumbar   . Elevated lipids   . Erosive esophagitis   . Foot pain   . GERD (gastroesophageal reflux disease)   . Glaucoma   . H/O concussion 04/2012   Due to fall at [facility]  . Heart murmur   . History of bone density study   . History of chickenpox   . HOH (hard of hearing)    Bilateral  Hearing Aids  . Hypertension   . Lower back pain   . Lower extremity edema   . Multinodular thyroid   . Multiple gastric ulcers   . Osteoporosis   . Personal history of chemotherapy    bladder  . 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[place])   . Thyroid disease     Past Surgical History:  Procedure Laterality Date  . ABDOMINAL HYSTERECTOMY  1973  . APPLICATION OF WOUND VAC Right 01/17/2017   Procedure: APPLICATION OF WOUND VAC;  Surgeon: Hessie Knows, MD;  Location: [facility] ORS;  Service: Orthopedics;  Laterality: Right;  . BACK SURGERY     03/1975-ruptured disk, 11/1983-ruptured disk, 11/1984-ruptured disk, 10/1996-ruptured disk, 08/1997-spinal fusion, 02/10/07-spinal fusion [facility]  . BACK SURGERY     Spinal Fusion X 2  . CARPAL TUNNEL RELEASE Right   . CATARACT EXTRACTION Bilateral   . CYSTOSCOPY W/ RETROGRADES Bilateral 09/29/2014   Procedure: CYSTOSCOPY WITH RETROGRADE PYELOGRAM;  Surgeon: Hollice Espy, MD;  Location: [facility] ORS;  Service: Urology;  Laterality: Bilateral;  . CYSTOSCOPY WITH BIOPSY N/A 11/02/2014   Procedure: CYSTOSCOPY WITH BIOPSY/WITH MITOMYCIN;  Surgeon: Hollice Espy, MD;  Location: [facility] ORS;  Service: Urology;  Laterality: N/A;  .  ESOPHAGOGASTRODUODENOSCOPY (EGD) WITH PROPOFOL N/A 05/10/2015   Procedure: ESOPHAGOGASTRODUODENOSCOPY (EGD) WITH PROPOFOL;  Surgeon: Lollie Sails, MD;  Location: [facility] ENDOSCOPY;  Service: Endoscopy;  Laterality: N/A;  . ESOPHAGOGASTRODUODENOSCOPY (EGD) WITH PROPOFOL N/A 01/08/2018   Procedure: ESOPHAGOGASTRODUODENOSCOPY (EGD) WITH PROPOFOL;  Surgeon: Toledo, [name], MD;  Location: [facility] ENDOSCOPY;  Service: Gastroenterology;  Laterality: N/A;  . EYE SURGERY Bilateral    Cataract Extraction with IOL  . FOOT SURGERY Right   . INCISION AND DRAINAGE ABSCESS Right 11/20/2016   Procedure: INCISION AND DRAINAGE ABSCESS GLUTEAL;  Surgeon: Christene Lye, MD;  Location: [facility] ORS;  Service: General;  Laterality: Right;  . INCISION AND DRAINAGE ABSCESS Right 01/17/2017   Procedure: INCISION AND DRAINAGE GLUTEAL ABSCESS;  Surgeon: Hessie Knows, MD;  Location: [facility] ORS;  Service: Orthopedics;  Laterality: Right;  . IRRIGATION AND DEBRIDEMENT BUTTOCKS Right 10/21/2015   Procedure: DRAINAGE GLUTEAL ABSCESS;  Surgeon: Christene Lye, MD;  Location: [facility] ORS;  Service: General;  Laterality: Right;  . KNEE ARTHROPLASTY Left 01/10/2018   Procedure: COMPUTER ASSISTED TOTAL KNEE ARTHROPLASTY;  Surgeon: Dereck Leep, MD;  Location: [facility] ORS;  Service: Orthopedics;  Laterality: Left;  . KNEE ARTHROPLASTY Right 06/16/2018   Procedure: COMPUTER ASSISTED TOTAL KNEE ARTHROPLASTY;  Surgeon: Dereck Leep, MD;  Location: [facility] ORS;  Service: Orthopedics;  Laterality: Right;  .  KNEE ARTHROSCOPY Right    x2  . MANDIBLE SURGERY Bilateral    x2  . PICC LINE INSERTION Right 01/08/2017  . SHOULDER ARTHROSCOPY WITH ROTATOR CUFF REPAIR AND SUBACROMIAL DECOMPRESSION Right 05/25/2015   Procedure: SHOULDER ARTHROSCOPY WITH ROTATOR CUFF REPAIR AND SUBACROMIAL DECOMPRESSION, release long head biceps tendon;  Surgeon: Leanor Kail, MD;  Location: [facility] ORS;  Service: Orthopedics;  Laterality: Right;  .  TRANSURETHRAL RESECTION OF BLADDER TUMOR N/A 09/29/2014   Procedure: TRANSURETHRAL RESECTION OF BLADDER TUMOR (TURBT);  Surgeon: Hollice Espy, MD;  Location: [facility] ORS;  Service: Urology;  Laterality: N/A;  . WRIST FRACTURE SURGERY Left     Current Outpatient Medications  Medication Sig Dispense Refill  . calcium-vitamin D (OSCAL WITH D) 500-200 MG-UNIT tablet Take 1 tablet by mouth daily.    . carvedilol (COREG) 12.5 MG tablet Take 12.5 mg by mouth 2 (two) times daily with a meal.    . Cholecalciferol (VITAMIN D3) 25 MCG (1000 UT) CAPS Take 1,000 Units by mouth daily.    [name] diltiazem (CARDIZEM CD) 240 MG 24 hr capsule Take 240 mg by mouth daily.    [name] latanoprost (XALATAN) 0.005 % ophthalmic solution Place 1 drop into both eyes at bedtime.    . pravastatin (PRAVACHOL) 40 MG tablet Take 40 mg by mouth at bedtime.     . raloxifene (EVISTA) 60 MG tablet Take 60 mg by mouth daily.    . vitamin B-12 (CYANOCOBALAMIN) 1000 MCG tablet Take 2,000 mcg by mouth daily.     No current facility-administered medications for this visit.     Allergies  Allergen Reactions  . Tramadol Nausea And Vomiting    Indication: Patient presents with symptomatic varicose veins of the left lower extremity.  Procedure: Foam sclerotherapy was performed on the left lower extremity. Using ultrasound guidance, 5 mL of foam Sotradecol was used to inject the varicosities of the left lower extremity. Compression wraps were placed. The patient tolerated the procedure well.

## 2019-04-29 ENCOUNTER — Encounter: Payer: Self-pay | Admitting: Urology

## 2019-04-29 ENCOUNTER — Ambulatory Visit: Payer: Medicare Other | Admitting: Urology

## 2019-04-29 VITALS — BP 157/74 | HR 73 | Ht 60.0 in | Wt 157.0 lb

## 2019-04-29 DIAGNOSIS — Z8551 Personal history of malignant neoplasm of bladder: Secondary | ICD-10-CM | POA: Diagnosis not present

## 2019-04-29 LAB — MICROSCOPIC EXAMINATION
Bacteria, UA: NONE SEEN
RBC, Urine: NONE SEEN /hpf (ref 0–2)

## 2019-04-29 LAB — URINALYSIS, COMPLETE
Bilirubin, UA: NEGATIVE
Glucose, UA: NEGATIVE
Ketones, UA: NEGATIVE
Leukocytes,UA: NEGATIVE
Nitrite, UA: NEGATIVE
Specific Gravity, UA: 1.025 (ref 1.005–1.030)
Urobilinogen, Ur: 0.2 mg/dL (ref 0.2–1.0)
pH, UA: 6.5 (ref 5.0–7.5)

## 2019-04-29 NOTE — Progress Notes (Signed)
    10:27 AM  04/29/19   [name] 09/20/1932 [id number]  Referring provider: Kirk Ruths, MD [facility] [place],  [place] [address]  Chief Complaint  Patient presents with  . Cysto    HPI: 85 year old nonsmoker with  incidental 11 mm papillary neoplasm near the RIGHT bladder neck. She was taken to the operating room on 09/29/2014 for TURBT, Bilateral retrograde pyelogramat which time a 2 cm extremely spherical mass was identified just proximal to the RIGHT UO. This was resected along with deeper biopsies of the base. Of note, bilateral retrograde pyelogram was negative for any obvious upper tract filling defects or hydronephrosis.   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 The tumor had an inverted architecture. Deeper biopsies did contain muscle after speaking with the pathologist today and the muscle was negative for any tumor. Additonally, CIS was noted.  She returned to the operating room on 11/02/2014 for restaging TURBT. Pathology showed no evidence of residual tumor.  She completed BCG x 6 for induction and first course of maintenence bcg x 3 doses  X 2.  Her last maintainance dose completed in 07/2015.     She developed recurrent gluteal abscess requiring multiple rounds of IV antibiotics and surgical intervention.  After this, additional BCG was deferred.  Most recent cross sectional imaging 10/2016 in the form of CT abd/ pelvis with contrast.  She returns today for surveillance cystoscopy.  She denies dysuria or gross hematuria.  Physical Exam: BP (!) 157/74   Pulse 73   Ht 5' (1.524 m)   Wt 157 lb (71.2 kg)   BMI 30.66 kg/m   Constitutional:  Alert and oriented, No acute distress. HEENT: [place] AT, moist mucus membranes.  Trachea midline, no masses. Cardiovascular: No clubbing, cyanosis, or edema. Respiratory: Normal respiratory effort, no increased work of  breathing. GI: Abdomen is soft, nontender, nondistended, no abdominal masses  GU:  Normal external genitalia. Normal urethral meatus. Skin: No rashes, bruises or suspicious lesions. Neurologic: Grossly intact, no focal deficits, moving all 4 extremities. Psychiatric: Normal mood and affect.   Urinalysis UA reviewed, no evidence of infection   Cystoscopy Procedure Note  Patient identification was confirmed, informed consent was obtained, and patient was prepped using Betadine solution.  Lidocaine jelly was administered per urethral meatus.    Preoperative abx where received prior to procedure.    Procedure: - Flexible cystoscope introduced, without any difficulty.   - Thorough search of the bladder revealed:    normal urethral meatus    normal urothelium    no stones    no ulcers     no tumors    no urethral polyps    no trabeculation  - Ureteral orifices were normal in position and appearance.  Post-Procedure: - Patient tolerated the procedure well  Assessment & Plan:  85 year old female with a history of high-grade T1, Tis of the right bladder neck dx 09/2014 followed by induction BCG x 6, and most recently  maintenance BCG completed 04/2015 and 07/2015.  She returns today for surveillance cystoscopy.   1. Malignant neoplasm of bladder neck Cystoscopy today negative  Cytology sent today Given lack of recurrence since 2016, okay to transition to annual cystoscopy  Return in about 1 year (around 04/28/2020) for cysto.     Hollice Espy, MD  [facility] [address] [place], [place], [place] [address] [phone]

## 2019-05-05 ENCOUNTER — Other Ambulatory Visit: Payer: Self-pay | Admitting: Urology

## 2019-05-12 ENCOUNTER — Encounter (INDEPENDENT_AMBULATORY_CARE_PROVIDER_SITE_OTHER): Payer: Self-pay | Admitting: Vascular Surgery

## 2019-05-12 ENCOUNTER — Other Ambulatory Visit: Payer: Self-pay

## 2019-05-12 ENCOUNTER — Ambulatory Visit (INDEPENDENT_AMBULATORY_CARE_PROVIDER_SITE_OTHER): Payer: Medicare Other | Admitting: Vascular Surgery

## 2019-05-12 VITALS — BP 162/78 | HR 60 | Resp 16 | Wt 156.0 lb

## 2019-05-12 DIAGNOSIS — I83893 Varicose veins of bilateral lower extremities with other complications: Secondary | ICD-10-CM

## 2019-05-12 DIAGNOSIS — I83891 Varicose veins of right lower extremities with other complications: Secondary | ICD-10-CM

## 2019-05-12 NOTE — Progress Notes (Signed)
[name] is a 85 y.o.female who presents with painful varicose veins of the right leg  Past Medical History:  Diagnosis Date  . Anemia   . Anemia   . Bladder cancer ([place])   . Cancer [facility]) 2016   bladder tumor  . Carotid arterial disease ([place]) 01/09/2014   Overview:  Minimal on screening   . Chronic low back pain   . Colon polyp   . DDD (degenerative disc disease), cervical   . DDD (degenerative disc disease), lumbar   . Elevated lipids   . Erosive esophagitis   . Foot pain   . GERD (gastroesophageal reflux disease)   . Glaucoma   . H/O concussion 04/2012   Due to fall at [facility]  . Heart murmur   . History of bone density study   . History of chickenpox   . HOH (hard of hearing)    Bilateral  Hearing Aids  . Hypertension   . Lower back pain   . Lower extremity edema   . Multinodular thyroid   . Multiple gastric ulcers   . Osteoporosis   . Personal history of chemotherapy    bladder  . 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[place])   . Thyroid disease     Past Surgical History:  Procedure Laterality Date  . ABDOMINAL HYSTERECTOMY  1973  . APPLICATION OF WOUND VAC Right 01/17/2017   Procedure: APPLICATION OF WOUND VAC;  Surgeon: Hessie Knows, MD;  Location: [facility] ORS;  Service: Orthopedics;  Laterality: Right;  . BACK SURGERY     03/1975-ruptured disk, 11/1983-ruptured disk, 11/1984-ruptured disk, 10/1996-ruptured disk, 08/1997-spinal fusion, 02/10/07-spinal fusion [facility]  . BACK SURGERY     Spinal Fusion X 2  . CARPAL TUNNEL RELEASE Right   . CATARACT EXTRACTION Bilateral   . CYSTOSCOPY W/ RETROGRADES Bilateral 09/29/2014   Procedure: CYSTOSCOPY WITH RETROGRADE PYELOGRAM;  Surgeon: Hollice Espy, MD;  Location: [facility] ORS;  Service: Urology;  Laterality: Bilateral;  . CYSTOSCOPY WITH BIOPSY N/A 11/02/2014   Procedure: CYSTOSCOPY WITH BIOPSY/WITH MITOMYCIN;  Surgeon: Hollice Espy, MD;  Location: [facility] ORS;  Service: Urology;  Laterality: N/A;  .  ESOPHAGOGASTRODUODENOSCOPY (EGD) WITH PROPOFOL N/A 05/10/2015   Procedure: ESOPHAGOGASTRODUODENOSCOPY (EGD) WITH PROPOFOL;  Surgeon: Lollie Sails, MD;  Location: [facility] ENDOSCOPY;  Service: Endoscopy;  Laterality: N/A;  . ESOPHAGOGASTRODUODENOSCOPY (EGD) WITH PROPOFOL N/A 01/08/2018   Procedure: ESOPHAGOGASTRODUODENOSCOPY (EGD) WITH PROPOFOL;  Surgeon: Toledo, [name], MD;  Location: [facility] ENDOSCOPY;  Service: Gastroenterology;  Laterality: N/A;  . EYE SURGERY Bilateral    Cataract Extraction with IOL  . FOOT SURGERY Right   . INCISION AND DRAINAGE ABSCESS Right 11/20/2016   Procedure: INCISION AND DRAINAGE ABSCESS GLUTEAL;  Surgeon: Christene Lye, MD;  Location: [facility] ORS;  Service: General;  Laterality: Right;  . INCISION AND DRAINAGE ABSCESS Right 01/17/2017   Procedure: INCISION AND DRAINAGE GLUTEAL ABSCESS;  Surgeon: Hessie Knows, MD;  Location: [facility];  Service: Orthopedics;  Laterality: Right;  . IRRIGATION AND DEBRIDEMENT BUTTOCKS Right 10/21/2015   Procedure: DRAINAGE GLUTEAL ABSCESS;  Surgeon: Christene Lye, MD;  Location: [facility];  Service: General;  Laterality: Right;  . KNEE ARTHROPLASTY Left 01/10/2018   Procedure: COMPUTER ASSISTED TOTAL KNEE ARTHROPLASTY;  Surgeon: Dereck Leep, MD;  Location: [facility] OR[facility];  Service: Orthopedics;  Laterality: Left;  . KNEE ARTHROPLASTY Right 06/16/2018   Procedure: COMPUTER ASSISTED TOTAL KNEE ARTHROPLASTY;  Surgeon: Dereck Leep, MD;  Location: [facility];  Service: Orthopedics;  Laterality: Right;  .  KNEE ARTHROSCOPY Right    x2  . MANDIBLE SURGERY Bilateral    x2  . PICC LINE INSERTION Right 01/08/2017  . SHOULDER ARTHROSCOPY WITH ROTATOR CUFF REPAIR AND SUBACROMIAL DECOMPRESSION Right 05/25/2015   Procedure: SHOULDER ARTHROSCOPY WITH ROTATOR CUFF REPAIR AND SUBACROMIAL DECOMPRESSION, release long head biceps tendon;  Surgeon: Leanor Kail, MD;  Location: [facility] ORS;  Service: Orthopedics;  Laterality: Right;  .  TRANSURETHRAL RESECTION OF BLADDER TUMOR N/A 09/29/2014   Procedure: TRANSURETHRAL RESECTION OF BLADDER TUMOR (TURBT);  Surgeon: Hollice Espy, MD;  Location: [facility] ORS;  Service: Urology;  Laterality: N/A;  . WRIST FRACTURE SURGERY Left     Current Outpatient Medications  Medication Sig Dispense Refill  . calcium-vitamin D (OSCAL WITH D) 500-200 MG-UNIT tablet Take 1 tablet by mouth daily.    . carvedilol (COREG) 12.5 MG tablet Take 12.5 mg by mouth 2 (two) times daily with a meal.    . Cholecalciferol (VITAMIN D3) 25 MCG (1000 UT) CAPS Take 1,000 Units by mouth daily.    [name] diltiazem (CARDIZEM CD) 240 MG 24 hr capsule Take 240 mg by mouth daily.    [name] latanoprost (XALATAN) 0.005 % ophthalmic solution Place 1 drop into both eyes at bedtime.    . pravastatin (PRAVACHOL) 40 MG tablet Take 40 mg by mouth at bedtime.     . raloxifene (EVISTA) 60 MG tablet Take 60 mg by mouth daily.    . vitamin B-12 (CYANOCOBALAMIN) 1000 MCG tablet Take 2,000 mcg by mouth daily.     No current facility-administered medications for this visit.    Allergies  Allergen Reactions  . Tramadol Nausea And Vomiting    Indication: Patient presents with symptomatic varicose veins of the right lower extremity.  Procedure: Foam sclerotherapy was performed on the right lower extremity. Using ultrasound guidance, 5 mL of foam Sotradecol was used to inject the varicosities of the right lower extremity. Compression wraps were placed. The patient tolerated the procedure well.

## 2019-06-09 ENCOUNTER — Ambulatory Visit (INDEPENDENT_AMBULATORY_CARE_PROVIDER_SITE_OTHER): Payer: Medicare Other | Admitting: Vascular Surgery

## 2019-06-09 ENCOUNTER — Encounter (INDEPENDENT_AMBULATORY_CARE_PROVIDER_SITE_OTHER): Payer: Self-pay | Admitting: Vascular Surgery

## 2019-06-09 ENCOUNTER — Other Ambulatory Visit: Payer: Self-pay

## 2019-06-09 VITALS — BP 180/97 | HR 55 | Resp 16 | Wt 159.0 lb

## 2019-06-09 DIAGNOSIS — I83892 Varicose veins of left lower extremities with other complications: Secondary | ICD-10-CM

## 2019-06-09 DIAGNOSIS — I83893 Varicose veins of bilateral lower extremities with other complications: Secondary | ICD-10-CM

## 2019-06-09 NOTE — Progress Notes (Signed)
[name] is a 86 y.o.female who presents with painful varicose veins of the left leg  Past Medical History:  Diagnosis Date  . Anemia   . Anemia   . Bladder cancer ([place])   . Cancer [facility]) 2016   bladder tumor  . Carotid arterial disease ([place]) 01/09/2014   Overview:  Minimal on screening   . Chronic low back pain   . Colon polyp   . DDD (degenerative disc disease), cervical   . DDD (degenerative disc disease), lumbar   . Elevated lipids   . Erosive esophagitis   . Foot pain   . GERD (gastroesophageal reflux disease)   . Glaucoma   . H/O concussion 04/2012   Due to fall at [facility]  . Heart murmur   . History of bone density study   . History of chickenpox   . HOH (hard of hearing)    Bilateral  Hearing Aids  . Hypertension   . Lower back pain   . Lower extremity edema   . Multinodular thyroid   . Multiple gastric ulcers   . Osteoporosis   . Personal history of chemotherapy    bladder  . 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[place])   . Thyroid disease     Past Surgical History:  Procedure Laterality Date  . ABDOMINAL HYSTERECTOMY  1973  . APPLICATION OF WOUND VAC Right 01/17/2017   Procedure: APPLICATION OF WOUND VAC;  Surgeon: Hessie Knows, MD;  Location: [facility] ORS;  Service: Orthopedics;  Laterality: Right;  . BACK SURGERY     03/1975-ruptured disk, 11/1983-ruptured disk, 11/1984-ruptured disk, 10/1996-ruptured disk, 08/1997-spinal fusion, 02/10/07-spinal fusion [facility]  . BACK SURGERY     Spinal Fusion X 2  . CARPAL TUNNEL RELEASE Right   . CATARACT EXTRACTION Bilateral   . CYSTOSCOPY W/ RETROGRADES Bilateral 09/29/2014   Procedure: CYSTOSCOPY WITH RETROGRADE PYELOGRAM;  Surgeon: Hollice Espy, MD;  Location: [facility] ORS;  Service: Urology;  Laterality: Bilateral;  . CYSTOSCOPY WITH BIOPSY N/A 11/02/2014   Procedure: CYSTOSCOPY WITH BIOPSY/WITH MITOMYCIN;  Surgeon: Hollice Espy, MD;  Location: [facility] ORS;  Service: Urology;  Laterality: N/A;  .  ESOPHAGOGASTRODUODENOSCOPY (EGD) WITH PROPOFOL N/A 05/10/2015   Procedure: ESOPHAGOGASTRODUODENOSCOPY (EGD) WITH PROPOFOL;  Surgeon: Lollie Sails, MD;  Location: [facility] ENDOSCOPY;  Service: Endoscopy;  Laterality: N/A;  . ESOPHAGOGASTRODUODENOSCOPY (EGD) WITH PROPOFOL N/A 01/08/2018   Procedure: ESOPHAGOGASTRODUODENOSCOPY (EGD) WITH PROPOFOL;  Surgeon: Toledo, [name], MD;  Location: [facility] ENDOSCOPY;  Service: Gastroenterology;  Laterality: N/A;  . EYE SURGERY Bilateral    Cataract Extraction with IOL  . FOOT SURGERY Right   . INCISION AND DRAINAGE ABSCESS Right 11/20/2016   Procedure: INCISION AND DRAINAGE ABSCESS GLUTEAL;  Surgeon: Christene Lye, MD;  Location: [facility] ORS;  Service: General;  Laterality: Right;  . INCISION AND DRAINAGE ABSCESS Right 01/17/2017   Procedure: INCISION AND DRAINAGE GLUTEAL ABSCESS;  Surgeon: Hessie Knows, MD;  Location: [facility] OR[facility];  Service: Orthopedics;  Laterality: Right;  . IRRIGATION AND DEBRIDEMENT BUTTOCKS Right 10/21/2015   Procedure: DRAINAGE GLUTEAL ABSCESS;  Surgeon: Christene Lye, MD;  Location: [facility] ORS;  Service: General;  Laterality: Right;  . KNEE ARTHROPLASTY Left 01/10/2018   Procedure: COMPUTER ASSISTED TOTAL KNEE ARTHROPLASTY;  Surgeon: Dereck Leep, MD;  Location: [facility] ORS;  Service: Orthopedics;  Laterality: Left;  . KNEE ARTHROPLASTY Right 06/16/2018   Procedure: COMPUTER ASSISTED TOTAL KNEE ARTHROPLASTY;  Surgeon: Dereck Leep, MD;  Location: [facility] ORS;  Service: Orthopedics;  Laterality: Right;  .  KNEE ARTHROSCOPY Right    x2  . MANDIBLE SURGERY Bilateral    x2  . PICC LINE INSERTION Right 01/08/2017  . SHOULDER ARTHROSCOPY WITH ROTATOR CUFF REPAIR AND SUBACROMIAL DECOMPRESSION Right 05/25/2015   Procedure: SHOULDER ARTHROSCOPY WITH ROTATOR CUFF REPAIR AND SUBACROMIAL DECOMPRESSION, release long head biceps tendon;  Surgeon: Leanor Kail, MD;  Location: [facility] ORS;  Service: Orthopedics;  Laterality: Right;  .  TRANSURETHRAL RESECTION OF BLADDER TUMOR N/A 09/29/2014   Procedure: TRANSURETHRAL RESECTION OF BLADDER TUMOR (TURBT);  Surgeon: Hollice Espy, MD;  Location: [facility] ORS;  Service: Urology;  Laterality: N/A;  . WRIST FRACTURE SURGERY Left     Current Outpatient Medications  Medication Sig Dispense Refill  . calcium-vitamin D (OSCAL WITH D) 500-200 MG-UNIT tablet Take 1 tablet by mouth daily.    . carvedilol (COREG) 12.5 MG tablet Take 12.5 mg by mouth 2 (two) times daily with a meal.    . Cholecalciferol (VITAMIN D3) 25 MCG (1000 UT) CAPS Take 1,000 Units by mouth daily.    [name] diltiazem (CARDIZEM CD) 240 MG 24 hr capsule Take 240 mg by mouth daily.    [name] latanoprost (XALATAN) 0.005 % ophthalmic solution Place 1 drop into both eyes at bedtime.    . pravastatin (PRAVACHOL) 40 MG tablet Take 40 mg by mouth at bedtime.     . raloxifene (EVISTA) 60 MG tablet Take 60 mg by mouth daily.    . vitamin B-12 (CYANOCOBALAMIN) 1000 MCG tablet Take 2,000 mcg by mouth daily.     No current facility-administered medications for this visit.    Allergies  Allergen Reactions  . Tramadol Nausea And Vomiting    Indication: Patient presents with symptomatic varicose veins of the left lower extremity.  Procedure: Foam sclerotherapy was performed on the left lower extremity. Using ultrasound guidance, 5 mL of foam Sotradecol was used to inject the varicosities of the left lower extremity. Compression wraps were placed. The patient tolerated the procedure well.

## 2020-02-02 ENCOUNTER — Other Ambulatory Visit: Payer: Self-pay | Admitting: Internal Medicine

## 2020-02-02 DIAGNOSIS — Z1231 Encounter for screening mammogram for malignant neoplasm of breast: Secondary | ICD-10-CM

## 2020-02-09 ENCOUNTER — Telehealth (INDEPENDENT_AMBULATORY_CARE_PROVIDER_SITE_OTHER): Payer: Self-pay | Admitting: Vascular Surgery

## 2020-02-09 NOTE — Telephone Encounter (Signed)
The patient is reminded that [name] should use compression socks to assist with leg swelling, in addition to leg elevation.  Otherwise [name] can come in with no studies to see me or dew

## 2020-02-09 NOTE — Telephone Encounter (Signed)
Patient had foam sclerotherapy on 06/09/19 as well.

## 2020-02-09 NOTE — Telephone Encounter (Signed)
Called stating that both of her legs are swollen with the right being worse around the legs and ankle. Swelling occurred about 2-3 months ago.  Patient would like to come in to be seen. Patient was last seen 06-09-19 with JD. Please advise.

## 2020-02-10 NOTE — Telephone Encounter (Signed)
Spoke with the patient and gave her Ronette Deter recommendations and patient understood. Patient stated she really wanted to see Dr. Lucky Cowboy, I explained that we could make her an appt to see Dr. Lucky Cowboy but be aware that it may be further out than seeing the NP patient stated that would be okay. I then looked in appts and the patient has an appt with Eulogio Ditch NP on 02/15/20. I let the patient know that the appt was not with Dr. Lucky Cowboy it was with the NP. I stated we could make an appt with Dr. Lucky Cowboy but the patient stated that the appt was okay.

## 2020-02-15 ENCOUNTER — Ambulatory Visit (INDEPENDENT_AMBULATORY_CARE_PROVIDER_SITE_OTHER): Payer: Medicare Other | Admitting: Nurse Practitioner

## 2020-04-06 ENCOUNTER — Other Ambulatory Visit: Payer: Self-pay

## 2020-04-06 ENCOUNTER — Ambulatory Visit
Admission: RE | Admit: 2020-04-06 | Discharge: 2020-04-06 | Disposition: A | Discharge status: Home / Self Care | Payer: Medicare Other | Source: Ambulatory Visit | Attending: Internal Medicine | Admitting: Internal Medicine

## 2020-04-06 DIAGNOSIS — Z1231 Encounter for screening mammogram for malignant neoplasm of breast: Secondary | ICD-10-CM

## 2020-05-03 ENCOUNTER — Encounter: Payer: Self-pay | Admitting: Urology

## 2020-05-03 ENCOUNTER — Ambulatory Visit (INDEPENDENT_AMBULATORY_CARE_PROVIDER_SITE_OTHER): Payer: Medicare Other | Admitting: Urology

## 2020-05-03 ENCOUNTER — Other Ambulatory Visit: Payer: Self-pay

## 2020-05-03 VITALS — BP 146/81 | HR 78

## 2020-05-03 DIAGNOSIS — Z8551 Personal history of malignant neoplasm of bladder: Secondary | ICD-10-CM

## 2020-05-03 NOTE — Progress Notes (Signed)
    10:33 AM  05/03/20   [name] 1932/05/29 [id number]  Referring provider: Kirk Ruths, MD [facility] [name] [id number]  Chief Complaint  Patient presents with  . Cysto    HPI: 86 year old nonsmoker with  incidental 11 mm papillary neoplasm near the RIGHT bladder neck. She was taken to the operating room on 09/29/2014 for TURBT, Bilateral retrograde pyelogramat which time a 2 cm extremely spherical mass was identified just proximal to the RIGHT UO. This was resected along with deeper biopsies of the base. Of note, bilateral retrograde pyelogram was negative for any obvious upper tract filling defects or hydronephrosis.   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 The tumor had an inverted architecture. Deeper biopsies did contain muscle after speaking with the pathologist today and the muscle was negative for any tumor. Additonally, CIS was noted.  She returned to the operating room on 11/02/2014 for restaging TURBT. Pathology showed no evidence of residual tumor.  She completed BCG x 6 for induction and first course of maintenence bcg x 3 doses  X 2.  Her last maintainance dose completed in 07/2015.     She developed recurrent gluteal abscess requiring multiple rounds of IV antibiotics and surgical intervention.  After this, additional BCG was deferred.  Most recent cross sectional imaging 10/2016 in the form of CT abd/ pelvis with contrast.  She returns today for surveillance cystoscopy.  She denies dysuria or gross hematuria.  She has had a good ear.    Physical Exam: BP (!) 146/81   Pulse 78   Constitutional:  Alert and oriented, No acute distress. HEENT: [place] AT, moist mucus membranes.  Trachea midline, no masses. Cardiovascular: No clubbing, cyanosis, or edema. Respiratory: Normal respiratory effort, no increased work of breathing. GI: Abdomen is soft,  nontender, nondistended, no abdominal masses  GU:  Normal external genitalia. Normal urethral meatus. Skin: No rashes, bruises or suspicious lesions. Neurologic: Grossly intact, no focal deficits, moving all 4 extremities. Psychiatric: Normal mood and affect.   Urinalysis UA reviewed, no evidence of infection   Cystoscopy Procedure Note  Patient identification was confirmed, informed consent was obtained, and patient was prepped using Betadine solution.  Lidocaine jelly was administered per urethral meatus.    Preoperative abx where received prior to procedure.    Procedure: - Flexible cystoscope introduced, without any difficulty.   - Thorough search of the bladder revealed:    normal urethral meatus    normal urothelium    no stones    no ulcers     no tumors    no urethral polyps    no trabeculation  - Ureteral orifices were normal in position and appearance.  Post-Procedure: - Patient tolerated the procedure well  Assessment & Plan:  86 year old female with a history of high-grade T1, Tis of the right bladder neck dx 09/2014 followed by induction BCG x 6, and most recently  maintenance BCG completed 04/2015 and 07/2015.  She returns today for surveillance cystoscopy.   1. Malignant neoplasm of bladder neck Cystoscopy today negative  Continue annual cysto     Hollice Espy, MD  [facility] [address] [place], [place], [place] [address] [phone]

## 2020-05-04 LAB — MICROSCOPIC EXAMINATION: Bacteria, UA: NONE SEEN

## 2020-05-04 LAB — URINALYSIS, COMPLETE
Bilirubin, UA: NEGATIVE
Glucose, UA: NEGATIVE
Ketones, UA: NEGATIVE
Nitrite, UA: NEGATIVE
Specific Gravity, UA: 1.02 (ref 1.005–1.030)
Urobilinogen, Ur: 0.2 mg/dL (ref 0.2–1.0)
pH, UA: 7 (ref 5.0–7.5)

## 2021-05-02 NOTE — Progress Notes (Signed)
05/03/21  CC:  Chief Complaint  Patient presents with   Cysto     HPI: [name] is a 87 y.o.female with a personal history of malignant neoplasm of bladder neck, who presents today for annual surveillance cystoscopy.   She was found to have  incidental 11 mm papillary neoplasm near the RIGHT bladder neck. She was taken to the operating room on 09/29/2014 for TURBT, Bilateral retrograde pyelogram at which time a 2 cm extremely spherical mass was identified just proximal to the RIGHT UO.   This was resected  along with  deeper biopsies of the base. Surgical pathology revealed consistent with a high-grade T1 lesion invasive into lamina propria. The tumor had an inverted architecture.  Deeper biopsies did contain muscle after speaking with the pathologist today and the muscle was negative for any tumor.  Additonally, CIS was noted.  She returned to the operating room on 11/02/2014 for restaging TURBT. Pathology showed no evidence of residual tumor.   She completed BCG x 6 for induction and first course of maintenence bcg x 3 doses  X 2. Her last maintainance dose completed in 07/2015.   She reports that she has not experienced any burning during urination or signs of infection.  She has had increasing issues with memory.   PMH: Past Medical History:  Diagnosis Date   Anemia    Anemia    Bladder cancer ([place])    Cancer ([place]) 2016   bladder tumor   Carotid arterial disease ([place]) 01/09/2014   Overview:  Minimal on screening    Chronic low back pain    Colon polyp    DDD (degenerative disc disease), cervical    DDD (degenerative disc disease), lumbar    Elevated lipids    Erosive esophagitis    Foot pain    GERD (gastroesophageal reflux disease)    Glaucoma    H/O concussion 04/2012   Due to fall at [facility]   Heart murmur    History of bone density study    History of chickenpox    HOH (hard of hearing)    Bilateral  Hearing Aids   Hypertension     Lower back pain    Lower extremity edema    Multinodular thyroid    Multiple gastric ulcers    Osteoporosis    Personal history of chemotherapy    bladder   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[place])    Thyroid disease     Surgical History: Past Surgical History:  Procedure Laterality Date   ABDOMINAL HYSTERECTOMY  3154   APPLICATION OF WOUND VAC Right 01/17/2017   Procedure: APPLICATION OF WOUND VAC;  Surgeon: Hessie Knows, MD;  Location: [facility] ORS;  Service: Orthopedics;  Laterality: Right;   BACK SURGERY     03/1975-ruptured disk, 11/1983-ruptured disk, 11/1984-ruptured disk, 10/1996-ruptured disk, 08/1997-spinal fusion, 02/10/07-spinal fusion [facility]   BACK SURGERY     Spinal Fusion X 2   CARPAL TUNNEL RELEASE Right    CATARACT EXTRACTION Bilateral    CYSTOSCOPY W/ RETROGRADES Bilateral 09/29/2014   Procedure: CYSTOSCOPY WITH RETROGRADE PYELOGRAM;  Surgeon: Hollice Espy, MD;  Location: [facility] ORS;  Service: Urology;  Laterality: Bilateral;   CYSTOSCOPY WITH BIOPSY N/A 11/02/2014   Procedure: CYSTOSCOPY WITH BIOPSY/WITH MITOMYCIN;  Surgeon: Hollice Espy, MD;  Location: [facility];  Service: Urology;  Laterality: N/A;   ESOPHAGOGASTRODUODENOSCOPY (EGD) WITH PROPOFOL N/A 05/10/2015   Procedure: ESOPHAGOGASTRODUODENOSCOPY (EGD) WITH PROPOFOL;  Surgeon: Lollie Sails, MD;  Location: [facility];  Service: Endoscopy;  Laterality: N/A;   ESOPHAGOGASTRODUODENOSCOPY (EGD) WITH PROPOFOL N/A 01/08/2018   Procedure: ESOPHAGOGASTRODUODENOSCOPY (EGD) WITH PROPOFOL;  Surgeon: Toledo, [name], MD;  Location: [facility];  Service: Gastroenterology;  Laterality: N/A;   EYE SURGERY Bilateral    Cataract Extraction with IOL   FOOT SURGERY Right    INCISION AND DRAINAGE ABSCESS Right 11/20/2016   Procedure: INCISION AND DRAINAGE ABSCESS GLUTEAL;  Surgeon: Christene Lye, MD;  Location: [facility];  Service: General;  Laterality: Right;   INCISION AND DRAINAGE ABSCESS Right 01/17/2017   Procedure: INCISION AND  DRAINAGE GLUTEAL ABSCESS;  Surgeon: Hessie Knows, MD;  Location: [facility];  Service: Orthopedics;  Laterality: Right;   IRRIGATION AND DEBRIDEMENT BUTTOCKS Right 10/21/2015   Procedure: DRAINAGE GLUTEAL ABSCESS;  Surgeon: Christene Lye, MD;  Location: [facility];  Service: General;  Laterality: Right;   KNEE ARTHROPLASTY Left 01/10/2018   Procedure: COMPUTER ASSISTED TOTAL KNEE ARTHROPLASTY;  Surgeon: Dereck Leep, MD;  Location: [facility];  Service: Orthopedics;  Laterality: Left;   KNEE ARTHROPLASTY Right 06/16/2018   Procedure: COMPUTER ASSISTED TOTAL KNEE ARTHROPLASTY;  Surgeon: Dereck Leep, MD;  Location: [facility];  Service: Orthopedics;  Laterality: Right;   KNEE ARTHROSCOPY Right    x2   MANDIBLE SURGERY Bilateral    x2   PICC LINE INSERTION Right 01/08/2017   SHOULDER ARTHROSCOPY WITH ROTATOR CUFF REPAIR AND SUBACROMIAL DECOMPRESSION Right 05/25/2015   Procedure: SHOULDER ARTHROSCOPY WITH ROTATOR CUFF REPAIR AND SUBACROMIAL DECOMPRESSION, release long head biceps tendon;  Surgeon: Leanor Kail, MD;  Location: [facility];  Service: Orthopedics;  Laterality: Right;   TRANSURETHRAL RESECTION OF BLADDER TUMOR N/A 09/29/2014   Procedure: TRANSURETHRAL RESECTION OF BLADDER TUMOR (TURBT);  Surgeon: Hollice Espy, MD;  Location: [facility];  Service: Urology;  Laterality: N/A;   WRIST FRACTURE SURGERY Left     Home Medications:  Allergies as of 05/03/2021       Reactions   Tramadol Nausea And Vomiting        Medication List        Accurate as of May 03, 2021 10:36 AM. If you have any questions, ask your nurse or doctor.          calcium-vitamin D 500-200 MG-UNIT tablet Commonly known as: OSCAL WITH D Take 1 tablet by mouth daily.   carvedilol 12.5 MG tablet Commonly known as: COREG Take 12.5 mg by mouth 2 (two) times daily with a meal.   diltiazem 240 MG 24 hr capsule Commonly known as: TIAZAC Take 1 capsule by mouth daily.   donepezil 5 MG  tablet Commonly known as: ARICEPT Take 5 mg by mouth at bedtime.   latanoprost 0.005 % ophthalmic solution Commonly known as: XALATAN Place 1 drop into both eyes at bedtime.   pravastatin 40 MG tablet Commonly known as: PRAVACHOL Take 40 mg by mouth at bedtime.   raloxifene 60 MG tablet Commonly known as: EVISTA Take 60 mg by mouth daily.   sulfamethoxazole-trimethoprim 800-160 MG tablet Commonly known as: BACTRIM DS Take 1 tablet by mouth 2 (two) times daily. Started by: Hollice Espy, MD   vitamin B-12 1000 MCG tablet Commonly known as: CYANOCOBALAMIN Take 2,000 mcg by mouth daily.   Vitamin D3 25 MCG (1000 UT) Caps Take 1,000 Units by mouth daily.        Allergies:  Allergies  Allergen Reactions   Tramadol Nausea And Vomiting    Family History: Family History  Problem Relation Age of Onset  Heart attack Father    Emphysema Father    Heart disease Mother    Hypertension Mother    Diabetes type II Sister    Kidney disease Neg Hx    Bladder Cancer Neg Hx    Breast cancer Neg Hx     Social History:  reports that she has never smoked. She has never used smokeless tobacco. She reports that she does not drink alcohol and does not use drugs.   Physical Exam: BP (!) 142/88   Pulse 99   Ht 5' (1.524 m)   Wt 159 lb (72.1 kg)   BMI 31.05 kg/m   Constitutional:  Alert and oriented, No acute distress. HEENT: [place] AT, moist mucus membranes.  Trachea midline, no masses. Cardiovascular: No clubbing, cyanosis, or edema. Respiratory: Normal respiratory effort, no increased work of breathing. Skin: No rashes, bruises or suspicious lesions. Neurologic: Grossly intact, no focal deficits, moving all 4 extremities. Psychiatric: Normal mood and affect.  Laboratory Data:  Lab Results  Component Value Date   CREATININE 0.84 11/24/2018    Urinalysis - + leukocytes, nitrite positive, with some bacteria.   Assessment/ Plan:  Acute cystitis - Urinalysis had some  leukocytes, nitrite positive, with some bacteria. Urine sent for culture. -We will treat for presumptive infection with Bactrim DS twice daily for 5 days  2. Malignant neoplasm of bladder neck  - Cystoscopy deferred  - Urinalysis had some leukocytes, nitrite positive, with some bacteria. Urine sent for culture.  Return for cystoscopy in 2 weeks (rescheduled)  I,Kailey Littlejohn,acting as a scribe for Hollice Espy, MD.,have documented all relevant documentation on the behalf of Hollice Espy, MD,as directed by  Hollice Espy, MD while in the presence of Hollice Espy, MD.  I have reviewed the above documentation for accuracy and completeness, and I agree with the above.   Hollice Espy, MD

## 2021-05-03 ENCOUNTER — Encounter: Payer: Self-pay | Admitting: Urology

## 2021-05-03 ENCOUNTER — Other Ambulatory Visit: Payer: Self-pay

## 2021-05-03 ENCOUNTER — Ambulatory Visit: Payer: Medicare Other | Admitting: Urology

## 2021-05-03 VITALS — BP 142/88 | HR 99 | Ht 60.0 in | Wt 159.0 lb

## 2021-05-03 DIAGNOSIS — Z8551 Personal history of malignant neoplasm of bladder: Secondary | ICD-10-CM | POA: Diagnosis not present

## 2021-05-03 LAB — URINALYSIS, COMPLETE
Bilirubin, UA: NEGATIVE
Glucose, UA: NEGATIVE
Ketones, UA: NEGATIVE
Leukocytes,UA: NEGATIVE
Nitrite, UA: POSITIVE — AB
Specific Gravity, UA: >1.03 — ABNORMAL HIGH (ref 1.005–1.030)
Urobilinogen, Ur: 0.2 mg/dL (ref 0.2–1.0)
pH, UA: 5.5 (ref 5.0–7.5)

## 2021-05-03 LAB — MICROSCOPIC EXAMINATION

## 2021-05-03 MED ORDER — SULFAMETHOXAZOLE-TRIMETHOPRIM 800-160 MG PO TABS: 1.0000 | ORAL_TABLET | Freq: Two times a day (BID) | ORAL | 0 refills | Status: DC

## 2021-05-06 LAB — CULTURE, URINE COMPREHENSIVE

## 2021-05-16 NOTE — Progress Notes (Signed)
° °  05/17/21  CC:  Chief Complaint  Patient presents with   Cysto    HPI: [name] is a 87 y.o.female with a personal history of malignant neoplasm of bladder neck, who presents today for annual surveillance cystoscopy.   She was found to have  incidental 11 mm papillary neoplasm near the RIGHT bladder neck. She was taken to the operating room on 09/29/2014 for TURBT, Bilateral retrograde pyelogram at which time a 2 cm extremely spherical mass was identified just proximal to the RIGHT UO.   This was resected  along with  deeper biopsies of the base. Surgical pathology revealed consistent with a high-grade T1 lesion invasive into lamina propria. The tumor had an inverted architecture.  Deeper biopsies did contain muscle after speaking with the pathologist today and the muscle was negative for any tumor.  Additonally, CIS was noted   She returned to the operating room on 11/02/2014 for restaging TURBT. Pathology showed no evidence of residual tumor.   She completed BCG x 6 for induction and first course of maintenence bcg x 3 doses  X 2. Her last maintainance dose completed in 07/2015.  Cystoscopy was deferred on 05/03/2021 due to presumed infection. Urine grew E.coli that was ampicillin resistant she was treated with Bactrim.   She is doing well today.  She continues to have worsening memory issues.  She is confused today.  Vitals:   05/17/21 1048  BP: 140/80  Pulse: 76  NED. A&Ox3.   No respiratory distress   Abd soft, NT, ND Normal external genitalia with patent urethral meatus  Cystoscopy Procedure Note  Patient identification was confirmed, informed consent was obtained, and patient was prepped using Betadine solution.  Lidocaine jelly was administered per urethral meatus.    Procedure: - Flexible cystoscope introduced, without any difficulty.   - Thorough search of the bladder revealed:    normal urethral meatus    normal urothelium    no stones    no ulcers      no tumors    no urethral polyps    no trabeculation    Diffuse cystitis cystica consistent with recent infection   - Ureteral orifices were normal in position and appearance.  Post-Procedure: - Patient tolerated the procedure well  Assessment/ Plan:  Malignant neoplasm of bladder neck  - NED on cystoscopy today  - Continue annual cystoscopy   2. Cystitis cystica  - Diffuse in bladder today on cystoscopy  - Secondary to recent infection   Return in 1 year for surveillance cystoscopy   I,Kailey Littlejohn,acting as a scribe for Hollice Espy, MD.,have documented all relevant documentation on the behalf of Hollice Espy, MD,as directed by  Hollice Espy, MD while in the presence of Hollice Espy, MD.  I have reviewed the above documentation for accuracy and completeness, and I agree with the above.   Hollice Espy, MD

## 2021-05-17 ENCOUNTER — Ambulatory Visit: Payer: Medicare Other | Admitting: Urology

## 2021-05-17 ENCOUNTER — Other Ambulatory Visit: Payer: Self-pay

## 2021-05-17 VITALS — BP 140/80 | HR 76 | Ht 60.0 in | Wt 159.0 lb

## 2021-05-17 DIAGNOSIS — Z8551 Personal history of malignant neoplasm of bladder: Secondary | ICD-10-CM

## 2021-11-23 ENCOUNTER — Emergency Department: Payer: Medicare Other

## 2021-11-23 ENCOUNTER — Emergency Department
Admission: EM | Admit: 2021-11-23 | Discharge: 2021-11-23 | Disposition: A | Discharge status: Home / Self Care | Payer: Medicare Other | Attending: Emergency Medicine | Admitting: Emergency Medicine

## 2021-11-23 ENCOUNTER — Encounter: Payer: Self-pay | Admitting: Emergency Medicine

## 2021-11-23 DIAGNOSIS — Z79899 Other long term (current) drug therapy: Secondary | ICD-10-CM | POA: Diagnosis not present

## 2021-11-23 DIAGNOSIS — I129 Hypertensive chronic kidney disease with stage 1 through stage 4 chronic kidney disease, or unspecified chronic kidney disease: Secondary | ICD-10-CM | POA: Insufficient documentation

## 2021-11-23 DIAGNOSIS — N189 Chronic kidney disease, unspecified: Secondary | ICD-10-CM | POA: Insufficient documentation

## 2021-11-23 DIAGNOSIS — F0390 Unspecified dementia without behavioral disturbance: Secondary | ICD-10-CM | POA: Insufficient documentation

## 2021-11-23 DIAGNOSIS — R2681 Unsteadiness on feet: Secondary | ICD-10-CM | POA: Insufficient documentation

## 2021-11-23 DIAGNOSIS — R42 Dizziness and giddiness: Secondary | ICD-10-CM | POA: Diagnosis present

## 2021-11-23 LAB — COMPREHENSIVE METABOLIC PANEL
ALT: 13 U/L (ref 0–44)
AST: 17 U/L (ref 15–41)
Albumin: 4.2 g/dL (ref 3.5–5.0)
Alkaline Phosphatase: 52 U/L (ref 38–126)
Anion gap: 7 (ref 5–15)
BUN: 18 mg/dL (ref 8–23)
CO2: 24 mmol/L (ref 22–32)
Calcium: 9.3 mg/dL (ref 8.9–10.3)
Chloride: 111 mmol/L (ref 98–111)
Creatinine, Ser: 0.93 mg/dL (ref 0.44–1.00)
GFR, Estimated: 59 mL/min — ABNORMAL LOW (ref >60)
Glucose, Bld: 111 mg/dL — ABNORMAL HIGH (ref 70–99)
Potassium: 4 mmol/L (ref 3.5–5.1)
Sodium: 142 mmol/L (ref 135–145)
Total Bilirubin: 1.2 mg/dL (ref 0.3–1.2)
Total Protein: 7.4 g/dL (ref 6.5–8.1)

## 2021-11-23 LAB — DIFFERENTIAL
Abs Immature Granulocytes: 0.01 10*3/uL (ref 0.00–0.07)
Basophils Absolute: 0 10*3/uL (ref 0.0–0.1)
Basophils Relative: 0 %
Eosinophils Absolute: 0.1 10*3/uL (ref 0.0–0.5)
Eosinophils Relative: 1 %
Immature Granulocytes: 0 %
Lymphocytes Relative: 28 %
Lymphs Abs: 1.7 10*3/uL (ref 0.7–4.0)
Monocytes Absolute: 0.5 10*3/uL (ref 0.1–1.0)
Monocytes Relative: 7 %
Neutro Abs: 3.9 10*3/uL (ref 1.7–7.7)
Neutrophils Relative %: 64 %

## 2021-11-23 LAB — URINALYSIS, ROUTINE W REFLEX MICROSCOPIC
Bacteria, UA: NONE SEEN
Bilirubin Urine: NEGATIVE
Glucose, UA: NEGATIVE mg/dL
Hgb urine dipstick: NEGATIVE
Ketones, ur: NEGATIVE mg/dL
Nitrite: NEGATIVE
Protein, ur: NEGATIVE mg/dL
Specific Gravity, Urine: 1.01 (ref 1.005–1.030)
pH: 7 (ref 5.0–8.0)

## 2021-11-23 LAB — PROTIME-INR
INR: 1 (ref 0.8–1.2)
Prothrombin Time: 13.1 seconds (ref 11.4–15.2)

## 2021-11-23 LAB — CBC
HCT: 39.6 % (ref 36.0–46.0)
Hemoglobin: 12.3 g/dL (ref 12.0–15.0)
MCH: 30.4 pg (ref 26.0–34.0)
MCHC: 31.1 g/dL (ref 30.0–36.0)
MCV: 97.8 fL (ref 80.0–100.0)
Platelets: 208 10*3/uL (ref 150–400)
RBC: 4.05 MIL/uL (ref 3.87–5.11)
RDW: 13.2 % (ref 11.5–15.5)
WBC: 6.2 10*3/uL (ref 4.0–10.5)
nRBC: 0 % (ref 0.0–0.2)

## 2021-11-23 LAB — APTT: aPTT: 29 seconds (ref 24–36)

## 2021-11-23 LAB — ETHANOL: Alcohol, Ethyl (B): <10 mg/dL (ref <10)

## 2021-11-23 MED ORDER — LACTATED RINGERS IV BOLUS: 1000.0000 mL | Freq: Once | INTRAVENOUS | Status: DC

## 2021-11-23 MED ORDER — SODIUM CHLORIDE 0.9% FLUSH: 3.0000 mL | Freq: Once | INTRAVENOUS | Status: DC

## 2021-11-23 NOTE — ED Triage Notes (Signed)
Pt arrives with family who reports she has been dizzy since she woke up. Family reports she had trouble walking when they got to PCP and he sent for eval. No arm or leg drift, denies dizziness while sitting. Pt was unsteady on her feet.

## 2021-11-23 NOTE — ED Provider Notes (Signed)
[facility] Provider Note    Event Date/Time   First MD Initiated Contact with Patient 11/23/21 1457     (approximate)   History   Dizziness   HPI  [name] is a 88 y.o. female who presents to the ED for evaluation of Dizziness   I reviewed PCP visit from earlier today.  Was routine follow-up visit, but reportedly on the way to the appointment developed sudden dizziness and instability. She has a history of dementia, HTN, HLD, CKD  Patient presents to the ED with her son for evaluation of unsteadiness and dizziness just today.  Last seen normal last night around dinnertime the son.  [place] reports [place], patient has her own home on the property and handles most of her ADLs, but they help her with preparing medications, and see her daily.  She ambulates independently without assistance device.   She reports that she feels fine right now has no complaints.  She does not remember the unsteadiness when trying to walk from the car to the PCP visit earlier today.  Son reports was quite unusual that she needed assistance walking.  They had use a wheelchair because of her inability to walk independently as she typically does.  No known falls or injuries, acute illnesses, nausea, vomiting, diarrhea, dysuria, headache.  Patient denies any vision changes.  Physical Exam   Triage Vital Signs: ED Triage Vitals [11/23/21 1330]  Enc Vitals Group     BP (!) 158/78     Pulse Rate 63     Resp 18     Temp 98.9 F (37.2 C)     Temp Source Oral     SpO2 96 %     Weight 161 lb (73 kg)     Height '5\' 3"'$  (1.6 m)     Head Circumference      Peak Flow      Pain Score 0     Pain Loc      Pain Edu?      Excl. in [place]?     Most recent vital signs: Vitals:   11/23/21 1430 11/23/21 1500  BP: (!) 132/96 (!) 181/71  Pulse: (!) 49 (!) 52  Resp: 20 12  Temp:    SpO2: 98% 97%    General: Awake, no distress.  CV:  Good  peripheral perfusion.  Resp:  Normal effort.  Abd:  No distention.  Soft and benign MSK:  No deformity noted.  Neuro:  No focal deficits appreciated. Cranial nerves II through XII intact 5/5 strength and sensation in all 4 extremities But once I stand her up, she is quite unsteady on her feet and requires my assistance getting back to the bed.  Son reports this is quite unusual. Other:     ED Results / Procedures / Treatments   Labs (all labs ordered are listed, but only abnormal results are displayed) Labs Reviewed  COMPREHENSIVE METABOLIC PANEL - Abnormal; Notable for the following components:      Result Value   Glucose, Bld 111 (*)    GFR, Estimated 59 (*)    All other components within normal limits  URINALYSIS, ROUTINE W REFLEX MICROSCOPIC - Abnormal; Notable for the following components:   Color, Urine STRAW (*)    APPearance CLEAR (*)    Leukocytes,Ua SMALL (*)    All other components within normal limits  PROTIME-INR  APTT  CBC  DIFFERENTIAL  ETHANOL  CBG MONITORING, ED    EKG Sinus rhythm, rate of 55 bpm.  Normal axis and mild first-degree AV block.  Otherwise appropriate intervals.  No clear signs of acute ischemia.  Elizabeth CT head interpreted by me without evidence of acute intracranial pathology  Official Elizabeth report(s): MR BRAIN WO CONTRAST  Result Date: 11/23/2021 CLINICAL DATA:  Dizziness beginning today.  Gait disturbance. EXAM: MRI HEAD WITHOUT CONTRAST TECHNIQUE: Multiplanar, multiecho pulse sequences of the brain and surrounding structures were obtained without intravenous contrast. COMPARISON:  Head CT same day FINDINGS: Brain: Diffusion imaging does not show any acute or subacute infarction. No focal abnormality affects the brainstem or cerebellum. Cerebral hemispheres show generalized age related atrophy with bilateral anterior temporal lobe encephalomalacia and gliosis likely secondary to distant closed head injury. Minor encephalomalacia and  gliosis seen at the inferior frontal lobes. Mild chronic small-vessel ischemic change affects the deep white matter. No large vessel territory infarction. No mass lesion, recent hemorrhage, hydrocephalus or extra-axial collection. Vascular: Major vessels at the base of the brain show flow. Skull and upper cervical spine: Negative Sinuses/Orbits: Inflammatory changes of both maxillary sinuses. Orbits negative. Other: None IMPRESSION: No acute intracranial finding.  Age related volume loss. Chronic encephalomalacia and gliosis of both temporal tips and to a lesser extent the inferior frontal lobes, findings likely secondary to remote closed head injury. Mild chronic small-vessel ischemic change of the cerebral hemispheric white matter. Chronic maxillary sinus inflammatory changes. Electronically Signed   By: Nelson Chimes M.D.   On: 11/23/2021 16:24   CT HEAD WO CONTRAST  Result Date: 11/23/2021 CLINICAL DATA:  Vertigo. EXAM: CT HEAD WITHOUT CONTRAST TECHNIQUE: Contiguous axial images were obtained from the base of the skull through the vertex without intravenous contrast. RADIATION DOSE REDUCTION: This exam was performed according to the departmental dose-optimization program which includes automated exposure control, adjustment of the mA and/or kV according to patient size and/or use of iterative reconstruction technique. COMPARISON:  May 17, 2017 FINDINGS: Brain: No evidence of acute infarction, hemorrhage, hydrocephalus, extra-axial collection or mass lesion/mass effect. Areas of encephalomalacia in the bilateral anterior temporal horns. Vascular: No hyperdense vessel or unexpected calcification. Skull: Normal. Negative for fracture or focal lesion. Sinuses/Orbits: Complete opacification of the bilateral maxillary sinuses, evidence of chronic severe sinusitis. Minimal opacification of the ethmoid sinuses. Other: None. IMPRESSION: 1. No acute intracranial abnormality. 2. Areas of encephalomalacia in the  bilateral anterior temporal horns with chronic appearance. 3. Chronic bilateral maxillary sinusitis. Electronically Signed   By: Fidela Salisbury M.D.   On: 11/23/2021 15:04    PROCEDURES and INTERVENTIONS:  .1-3 Lead EKG Interpretation  Performed by: Vladimir Crofts, MD Authorized by: Vladimir Crofts, MD     Interpretation: normal     ECG rate:  60   ECG rate assessment: normal     Rhythm: sinus rhythm     Ectopy: none     Conduction: normal     Medications  sodium chloride flush (NS) 0.9 % injection 3 mL (3 mLs Intravenous Not Given 11/23/21 1420)     IMPRESSION / MDM / ASSESSMENT AND PLAN / ED COURSE  I reviewed the triage vital signs and the nursing notes.  Differential diagnosis includes, but is not limited to, stroke, peripheral vertigo, acute cystitis, dehydration, electrolyte disturbance  {Patient presents with symptoms of an acute illness or injury that is potentially life-threatening.  Pleasant and typically quite functional 88 year old female presents with new difficulty walking and balance issues.  Initial work-up is reassuring with  normal CBC, metabolic panel and coagulation studies.  CT head without evidence of ICH or notable CNS pathology.  EKG without dysrhythmias.  We will add a urinalysis and MRI of her brain to assess for cystitis and stroke, respectively.  As below, patient has a very reassuring work-up and feels better after eating food.  I considered observation admission for the patient, but ultimately we decided outpatient management which I think is reasonable.  We discussed close follow-up with her PCP and return precautions.  Clinical Course as of 11/23/21 1730  Thu Nov 23, 2021  1652 Reassessed after MRI.  Patient sitting up in the side of the bed and eating a [employer] sandwich that her son just brought her.  We discussed reassuring MRI.  We discussed pending urinalysis. [DS]  [id number].  Urinalysis is normal and we discussed benign work-up overall  without evidence of any acute derangements beyond her labile blood pressures.  After she finished eating her sandwich, she reports feeling better.  Me and the son stand her up again and she walks around the room and is looking better than when I initially evaluated her.  No significant unsteadiness and son acknowledges that she seems a lot better now.  We discussed possible etiologies of her symptoms.  They admit that she has not eaten in about 24 hours, because they had dinner earlier last night, and she did not have any breakfast this morning before they went out to see Dr. Ouida Sills. [DS]    Clinical Course User Index [DS] Vladimir Crofts, MD     FINAL CLINICAL IMPRESSION(S) / ED DIAGNOSES   Final diagnoses:  Unsteady gait     Rx / DC Orders   ED Discharge Orders     None        Note:  This document was prepared using Dragon voice recognition software and may include unintentional dictation errors.   Vladimir Crofts, MD 11/23/21 [phone]

## 2021-11-23 NOTE — ED Notes (Signed)
Pt states when she went to bed last night at 2130 she was fine. Pt woke up this morning dizzy.

## 2021-11-23 NOTE — ED Notes (Signed)
Pt knows need for ua. Pt unable to go at this time

## 2022-05-15 ENCOUNTER — Ambulatory Visit: Payer: Medicare Other | Admitting: Urology

## 2022-05-15 VITALS — BP 167/83 | HR 59

## 2022-05-15 DIAGNOSIS — Z8551 Personal history of malignant neoplasm of bladder: Secondary | ICD-10-CM | POA: Diagnosis not present

## 2022-05-15 NOTE — Progress Notes (Signed)
   05/15/22  CC:  Chief Complaint  Patient presents with   Cysto    HPI: [name] is a 88 y.o.female with a personal history of malignant neoplasm of bladder neck, who presents today for annual surveillance cystoscopy.   She was found to have  incidental 11 mm papillary neoplasm near the RIGHT bladder neck. She was taken to the operating room on 09/29/2014 for TURBT, Bilateral retrograde pyelogram at which time a 2 cm extremely spherical mass was identified just proximal to the RIGHT UO.   This was resected  along with  deeper biopsies of the base. Surgical pathology revealed consistent with a high-grade T1 lesion invasive into lamina propria. The tumor had an inverted architecture.  Deeper biopsies did contain muscle after speaking with the pathologist today and the muscle was negative for any tumor.  Additonally, CIS was noted  She returned to the operating room on 11/02/2014 for restaging TURBT. Pathology showed no evidence of residual tumor.   She completed BCG x 6 for induction and first course of maintenence bcg x 3 doses  X 2. Her last maintainance dose completed in 07/2015.  She is doing well today.  She has no urinary complaints, increased progression of dementia noted.    UA / UCx on 05/07/22 with PCP with 14 RBC/ 6 WBC, culture negative.  Asymptomatic today.   Vitals:   05/15/22 1005  BP: (!) 167/83  Pulse: (!) 59  NED. A&Ox3.   No respiratory distress   Abd soft, NT, ND Normal external genitalia with patent urethral meatus  Cystoscopy Procedure Note  Patient identification was confirmed, informed consent was obtained, and patient was prepped using Betadine solution.  Lidocaine jelly was administered per urethral meatus.    Procedure: - Flexible cystoscope introduced, without any difficulty.   - Thorough search of the bladder revealed:    normal urethral meatus    normal urothelium    no stones    no ulcers     no tumors    no urethral polyps    no  trabeculation  - Ureteral orifices were normal in position and appearance.  Post-Procedure: - Patient tolerated the procedure well  Assessment/ Plan:  History of Malignant neoplasm of bladder neck  - NED on cystoscopy today  - Continue annual cystoscopy   2. Microscopic hematuria -cysto is reassuring -given progressive dementia and age, will hold of on repeat upper tract imaging at this time   Return in 1 year for surveillance cystoscopy    Hollice Espy, MD

## 2023-04-11 ENCOUNTER — Encounter: Payer: Self-pay | Admitting: Oncology

## 2023-04-11 ENCOUNTER — Observation Stay
Admission: EM | Admit: 2023-04-11 | Discharge: 2023-04-12 | Disposition: A | Discharge status: Home / Self Care | Payer: Medicare Other | Attending: Internal Medicine | Admitting: Internal Medicine

## 2023-04-11 ENCOUNTER — Other Ambulatory Visit: Payer: Self-pay

## 2023-04-11 ENCOUNTER — Emergency Department: Payer: Medicare Other

## 2023-04-11 DIAGNOSIS — G9341 Metabolic encephalopathy: Secondary | ICD-10-CM | POA: Insufficient documentation

## 2023-04-11 DIAGNOSIS — D649 Anemia, unspecified: Secondary | ICD-10-CM | POA: Diagnosis not present

## 2023-04-11 DIAGNOSIS — E039 Hypothyroidism, unspecified: Secondary | ICD-10-CM | POA: Insufficient documentation

## 2023-04-11 DIAGNOSIS — N390 Urinary tract infection, site not specified: Secondary | ICD-10-CM | POA: Diagnosis not present

## 2023-04-11 DIAGNOSIS — I129 Hypertensive chronic kidney disease with stage 1 through stage 4 chronic kidney disease, or unspecified chronic kidney disease: Secondary | ICD-10-CM | POA: Insufficient documentation

## 2023-04-11 DIAGNOSIS — N189 Chronic kidney disease, unspecified: Secondary | ICD-10-CM | POA: Insufficient documentation

## 2023-04-11 DIAGNOSIS — F03C0 Unspecified dementia, severe, without behavioral disturbance, psychotic disturbance, mood disturbance, and anxiety: Secondary | ICD-10-CM | POA: Insufficient documentation

## 2023-04-11 DIAGNOSIS — R001 Bradycardia, unspecified: Principal | ICD-10-CM

## 2023-04-11 DIAGNOSIS — N3000 Acute cystitis without hematuria: Secondary | ICD-10-CM

## 2023-04-11 DIAGNOSIS — Z96653 Presence of artificial knee joint, bilateral: Secondary | ICD-10-CM | POA: Diagnosis not present

## 2023-04-11 DIAGNOSIS — Z79899 Other long term (current) drug therapy: Secondary | ICD-10-CM | POA: Diagnosis not present

## 2023-04-11 DIAGNOSIS — Z8551 Personal history of malignant neoplasm of bladder: Secondary | ICD-10-CM | POA: Diagnosis not present

## 2023-04-11 DIAGNOSIS — F0390 Unspecified dementia without behavioral disturbance: Secondary | ICD-10-CM | POA: Insufficient documentation

## 2023-04-11 DIAGNOSIS — R4182 Altered mental status, unspecified: Principal | ICD-10-CM

## 2023-04-11 LAB — COMPREHENSIVE METABOLIC PANEL
ALT: 13 U/L (ref 0–44)
AST: 15 U/L (ref 15–41)
Albumin: 3.8 g/dL (ref 3.5–5.0)
Alkaline Phosphatase: 48 U/L (ref 38–126)
Anion gap: 6 (ref 5–15)
BUN: 14 mg/dL (ref 8–23)
CO2: 25 mmol/L (ref 22–32)
Calcium: 8.7 mg/dL — ABNORMAL LOW (ref 8.9–10.3)
Chloride: 107 mmol/L (ref 98–111)
Creatinine, Ser: 0.81 mg/dL (ref 0.44–1.00)
GFR, Estimated: >60 mL/min (ref >60)
Glucose, Bld: 121 mg/dL — ABNORMAL HIGH (ref 70–99)
Potassium: 3.7 mmol/L (ref 3.5–5.1)
Sodium: 138 mmol/L (ref 135–145)
Total Bilirubin: 1.2 mg/dL — ABNORMAL HIGH (ref <1.2)
Total Protein: 6.9 g/dL (ref 6.5–8.1)

## 2023-04-11 LAB — URINALYSIS, ROUTINE W REFLEX MICROSCOPIC
Bilirubin Urine: NEGATIVE
Glucose, UA: NEGATIVE mg/dL
Hgb urine dipstick: NEGATIVE
Ketones, ur: NEGATIVE mg/dL
Nitrite: NEGATIVE
Protein, ur: NEGATIVE mg/dL
Specific Gravity, Urine: 1.011 (ref 1.005–1.030)
pH: 7 (ref 5.0–8.0)

## 2023-04-11 LAB — CBC
HCT: 38.2 % (ref 36.0–46.0)
Hemoglobin: 12.5 g/dL (ref 12.0–15.0)
MCH: 32.2 pg (ref 26.0–34.0)
MCHC: 32.7 g/dL (ref 30.0–36.0)
MCV: 98.5 fL (ref 80.0–100.0)
Platelets: 205 10*3/uL (ref 150–400)
RBC: 3.88 MIL/uL (ref 3.87–5.11)
RDW: 12.9 % (ref 11.5–15.5)
WBC: 5.3 10*3/uL (ref 4.0–10.5)
nRBC: 0 % (ref 0.0–0.2)

## 2023-04-11 LAB — MAGNESIUM: Magnesium: 2.4 mg/dL (ref 1.7–2.4)

## 2023-04-11 LAB — TROPONIN I (HIGH SENSITIVITY): Troponin I (High Sensitivity): 7 ng/L (ref <18)

## 2023-04-11 MED ORDER — PRAVASTATIN SODIUM 40 MG PO TABS
40.0000 mg | ORAL_TABLET | Freq: Every day | ORAL | Status: DC
Start: 2023-04-11 — End: 2023-04-12
  Administered 2023-04-11: 40 mg via ORAL
  Filled 2023-04-11: qty 1

## 2023-04-11 MED ORDER — HYDROCHLOROTHIAZIDE 12.5 MG PO TABS
12.5000 mg | ORAL_TABLET | Freq: Every day | ORAL | Status: DC
  Administered 2023-04-11 – 2023-04-12 (×2): 12.5 mg via ORAL
  Filled 2023-04-11 (×2): qty 1

## 2023-04-11 MED ORDER — AMLODIPINE BESYLATE 10 MG PO TABS
10.0000 mg | ORAL_TABLET | Freq: Every day | ORAL | Status: DC
  Administered 2023-04-12: 10 mg via ORAL
  Filled 2023-04-11: qty 1

## 2023-04-11 MED ORDER — RALOXIFENE HCL 60 MG PO TABS
60.0000 mg | ORAL_TABLET | Freq: Every day | ORAL | Status: DC
  Administered 2023-04-12: 60 mg via ORAL
  Filled 2023-04-11: qty 1

## 2023-04-11 MED ORDER — HALOPERIDOL LACTATE 5 MG/ML IJ SOLN
INTRAMUSCULAR | Status: AC
  Filled 2023-04-11: qty 1

## 2023-04-11 MED ORDER — ENALAPRILAT 1.25 MG/ML IV SOLN
0.6250 mg | Freq: Once | INTRAVENOUS | Status: AC
  Administered 2023-04-11: 0.625 mg via INTRAVENOUS
  Filled 2023-04-11 (×6): qty 0.5

## 2023-04-11 MED ORDER — DONEPEZIL HCL 5 MG PO TABS
5.0000 mg | ORAL_TABLET | Freq: Every day | ORAL | Status: DC
Start: 2023-04-11 — End: 2023-04-12
  Administered 2023-04-11: 5 mg via ORAL
  Filled 2023-04-11: qty 1

## 2023-04-11 MED ORDER — AMLODIPINE BESYLATE 5 MG PO TABS
5.0000 mg | ORAL_TABLET | Freq: Every day | ORAL | Status: DC
  Filled 2023-04-11: qty 1

## 2023-04-11 MED ORDER — ENOXAPARIN SODIUM 40 MG/0.4ML IJ SOSY
40.0000 mg | PREFILLED_SYRINGE | INTRAMUSCULAR | Status: DC
  Administered 2023-04-11: 40 mg via SUBCUTANEOUS
  Filled 2023-04-11: qty 0.4

## 2023-04-11 MED ORDER — ONDANSETRON HCL 4 MG PO TABS: 4.0000 mg | ORAL_TABLET | Freq: Four times a day (QID) | ORAL | Status: DC | PRN

## 2023-04-11 MED ORDER — QUETIAPINE FUMARATE 25 MG PO TABS
25.0000 mg | ORAL_TABLET | Freq: Every day | ORAL | Status: DC
  Administered 2023-04-11: 25 mg via ORAL
  Filled 2023-04-11: qty 1

## 2023-04-11 MED ORDER — SODIUM CHLORIDE 0.9 % IV SOLN
1.0000 g | Freq: Once | INTRAVENOUS | Status: AC
  Administered 2023-04-11: 1 g via INTRAVENOUS
  Filled 2023-04-11: qty 10

## 2023-04-11 MED ORDER — AMLODIPINE BESYLATE 5 MG PO TABS
5.0000 mg | ORAL_TABLET | Freq: Once | ORAL | Status: AC
  Administered 2023-04-11: 5 mg via ORAL
  Filled 2023-04-11: qty 1

## 2023-04-11 MED ORDER — HALOPERIDOL LACTATE 5 MG/ML IJ SOLN
2.0000 mg | Freq: Four times a day (QID) | INTRAMUSCULAR | Status: DC | PRN
  Administered 2023-04-11 (×2): 2 mg via INTRAVENOUS
  Filled 2023-04-11: qty 1

## 2023-04-11 MED ORDER — OLANZAPINE 10 MG IM SOLR
5.0000 mg | Freq: Once | INTRAMUSCULAR | Status: AC
  Administered 2023-04-11: 5 mg via INTRAMUSCULAR
  Filled 2023-04-11: qty 10

## 2023-04-11 MED ORDER — HYDRALAZINE HCL 20 MG/ML IJ SOLN
5.0000 mg | Freq: Four times a day (QID) | INTRAMUSCULAR | Status: DC | PRN
  Administered 2023-04-11: 5 mg via INTRAVENOUS
  Filled 2023-04-11: qty 1

## 2023-04-11 MED ORDER — ONDANSETRON HCL 4 MG/2ML IJ SOLN: 4.0000 mg | Freq: Four times a day (QID) | INTRAMUSCULAR | Status: DC | PRN

## 2023-04-11 NOTE — ED Notes (Signed)
Pt calming with therapeutic medication. Restraints not put in place. MD messaged to d/c restraints. MD d/c restraint order. Pt sitting with lights dim, music on, warm blankets given. Sons at bedside. Sons educated on call light use.

## 2023-04-11 NOTE — H&P (Signed)
History and Physical    [name]:[id number] DOB: September 20, 1932 DOA: 04/11/2023  PCP: Lauro Regulus, MD (Confirm with patient/family/NH records and if not entered, this has to be entered at [facility] point of entry) Patient coming from: Home  I have personally briefly reviewed patient's old medical records in [facility]  Chief Complaint: AMS  HPI: [name] is a 89 y.o. female with medical history significant of advanced dementia, HTN, brought in by family member for evaluation of altered mentations.  Patient is confused and with baseline dementia unable to provide any history, all history provided by son at bedside.  At baseline, patient able to communicate with family members for simple topics.  She cannot bathe herself or dress herself but she can feed herself.  Family has a caregiver coming in 2-3 times a week daytime to take care of her.  She was last known normal about dinnertime yesterday.  This morning, patient was found lying in bed awake, confused.  Family worried the patient might have a stroke and called EMS.  Patient denied any chest pain abdominal pain or dysuria no cough no diarrhea.  Denied any weakness or numbness of any of the limbs.  ED Course: Heart rate in the lower 40s, blood pressure elevated 170/85.  CT head showed no acute intra-abdominal changes but chronic diffused atrophy and small vessel ischemic changes.  Blood work showed WBC 5.3, creatinine 0.8 BUN 14.  UA showed WBC 11-20, few bacteria and positive leukocyte.  Patient received ceftriaxone x 1 in the ED.  Review of Systems: Unable to perform, patient is confused.  Past Medical History:  Diagnosis Date   Anemia    Anemia    Bladder cancer (HCC)    Cancer (HCC) 2016   bladder tumor   Carotid arterial disease (HCC) 01/09/2014   Overview:  Minimal on screening    Chronic low back pain    Colon polyp    DDD (degenerative disc disease), cervical    DDD (degenerative disc  disease), lumbar    Elevated lipids    Erosive esophagitis    Foot pain    GERD (gastroesophageal reflux disease)    Glaucoma    H/O concussion 04/2012   Due to fall at [facility]   Heart murmur    History of bone density study    History of chickenpox    HOH (hard of hearing)    Bilateral  Hearing Aids   Hypertension    Lower back pain    Lower extremity edema    Multinodular thyroid    Multiple gastric ulcers    Osteoporosis    Personal history of chemotherapy    bladder   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HCC)    Thyroid disease     Past Surgical History:  Procedure Laterality Date   ABDOMINAL HYSTERECTOMY  1973   APPLICATION OF WOUND VAC Right 01/17/2017   Procedure: APPLICATION OF WOUND VAC;  Surgeon: Kennedy Bucker, MD;  Location: [facility] ORS;  Service: Orthopedics;  Laterality: Right;   BACK SURGERY     03/1975-ruptured disk, 11/1983-ruptured disk, 11/1984-ruptured disk, 10/1996-ruptured disk, 08/1997-spinal fusion, 02/10/07-spinal fusion [facility]   BACK SURGERY     Spinal Fusion X 2   CARPAL TUNNEL RELEASE Right    CATARACT EXTRACTION Bilateral    CYSTOSCOPY W/ RETROGRADES Bilateral 09/29/2014   Procedure: CYSTOSCOPY WITH RETROGRADE PYELOGRAM;  Surgeon: Vanna Scotland, MD;  Location: [facility] ORS;  Service: Urology;  Laterality: Bilateral;   CYSTOSCOPY  WITH BIOPSY N/A 11/02/2014   Procedure: CYSTOSCOPY WITH BIOPSY/WITH MITOMYCIN;  Surgeon: Vanna Scotland, MD;  Location: [facility] ORS;  Service: Urology;  Laterality: N/A;   ESOPHAGOGASTRODUODENOSCOPY (EGD) WITH PROPOFOL N/A 05/10/2015   Procedure: ESOPHAGOGASTRODUODENOSCOPY (EGD) WITH PROPOFOL;  Surgeon: Christena Deem, MD;  Location: [facility] ENDOSCOPY;  Service: Endoscopy;  Laterality: N/A;   ESOPHAGOGASTRODUODENOSCOPY (EGD) WITH PROPOFOL N/A 01/08/2018   Procedure: ESOPHAGOGASTRODUODENOSCOPY (EGD) WITH PROPOFOL;  Surgeon: Toledo, Boykin Nearing, MD;  Location: [facility] ENDOSCOPY;  Service: Gastroenterology;  Laterality: N/A;   EYE SURGERY  Bilateral    Cataract Extraction with IOL   FOOT SURGERY Right    INCISION AND DRAINAGE ABSCESS Right 11/20/2016   Procedure: INCISION AND DRAINAGE ABSCESS GLUTEAL;  Surgeon: Kieth Brightly, MD;  Location: [facility] ORS;  Service: General;  Laterality: Right;   INCISION AND DRAINAGE ABSCESS Right 01/17/2017   Procedure: INCISION AND DRAINAGE GLUTEAL ABSCESS;  Surgeon: Kennedy Bucker, MD;  Location: [facility] ORS;  Service: Orthopedics;  Laterality: Right;   IRRIGATION AND DEBRIDEMENT BUTTOCKS Right 10/21/2015   Procedure: DRAINAGE GLUTEAL ABSCESS;  Surgeon: Kieth Brightly, MD;  Location: [facility] ORS;  Service: General;  Laterality: Right;   KNEE ARTHROPLASTY Left 01/10/2018   Procedure: COMPUTER ASSISTED TOTAL KNEE ARTHROPLASTY;  Surgeon: Donato Heinz, MD;  Location: [facility] OR[facility];  Service: Orthopedics;  Laterality: Left;   KNEE ARTHROPLASTY Right 06/16/2018   Procedure: COMPUTER ASSISTED TOTAL KNEE ARTHROPLASTY;  Surgeon: Donato Heinz, MD;  Location: [facility];  Service: Orthopedics;  Laterality: Right;   KNEE ARTHROSCOPY Right    x2   MANDIBLE SURGERY Bilateral    x2   PICC LINE INSERTION Right 01/08/2017   SHOULDER ARTHROSCOPY WITH ROTATOR CUFF REPAIR AND SUBACROMIAL DECOMPRESSION Right 05/25/2015   Procedure: SHOULDER ARTHROSCOPY WITH ROTATOR CUFF REPAIR AND SUBACROMIAL DECOMPRESSION, release long head biceps tendon;  Surgeon: Erin Sons, MD;  Location: [facility];  Service: Orthopedics;  Laterality: Right;   TRANSURETHRAL RESECTION OF BLADDER TUMOR N/A 09/29/2014   Procedure: TRANSURETHRAL RESECTION OF BLADDER TUMOR (TURBT);  Surgeon: Vanna Scotland, MD;  Location: [facility] OR[facility];  Service: Urology;  Laterality: N/A;   WRIST FRACTURE SURGERY Left      reports that she has never smoked. She has never used smokeless tobacco. She reports that she does not drink alcohol and does not use drugs.  Allergies  Allergen Reactions   Tramadol Nausea And Vomiting    Family History  Problem Relation  Age of Onset   Heart attack Father    Emphysema Father    Heart disease Mother    Hypertension Mother    Diabetes type II Sister    Kidney disease Neg Hx    Bladder Cancer Neg Hx    Breast cancer Neg Hx      Prior to Admission medications   Medication Sig Start Date End Date Taking? Authorizing Provider  calcium-vitamin D (OSCAL WITH D) 500-200 MG-UNIT tablet Take 1 tablet by mouth daily.    [provider]  carvedilol (COREG) 12.5 MG tablet Take 12.5 mg by mouth 2 (two) times daily with a meal.    [provider]  Cholecalciferol (VITAMIN D3) 25 MCG (1000 UT) CAPS Take 1,000 Units by mouth daily.    [provider]  diltiazem (CARDIZEM CD) 240 MG 24 hr capsule Take 240 mg by mouth daily. 05/04/21   [provider]  donepezil (ARICEPT) 5 MG tablet Take 5 mg by mouth at bedtime. 03/12/21   [provider]  latanoprost Harrel Lemon)  0.005 % ophthalmic solution Place 1 drop into both eyes at bedtime.    [provider]  pravastatin (PRAVACHOL) 40 MG tablet Take 40 mg by mouth at bedtime.     [provider]  raloxifene (EVISTA) 60 MG tablet Take 60 mg by mouth daily.    [provider]  vitamin B-12 (CYANOCOBALAMIN) 1000 MCG tablet Take 2,000 mcg by mouth daily.    [provider]    Physical Exam: Vitals:   04/11/23 1030 04/11/23 1100 04/11/23 1200 04/11/23 1300  BP: (!) 190/78 (!) 205/70 (!) 181/82   Pulse: (!) 47 (!) 45 (!) 50   Resp: 15 14 19    Temp:      SpO2: 97% 99% 95%   Weight:    81.6 kg  Height:    5' 2.99" (1.6 m)    Constitutional: NAD, calm, comfortable Vitals:   04/11/23 1030 04/11/23 1100 04/11/23 1200 04/11/23 1300  BP: (!) 190/78 (!) 205/70 (!) 181/82   Pulse: (!) 47 (!) 45 (!) 50   Resp: 15 14 19    Temp:      SpO2: 97% 99% 95%   Weight:    81.6 kg  Height:    5' 2.99" (1.6 m)   Eyes: PERRL, lids and conjunctivae normal ENMT: Mucous membranes are moist. Posterior pharynx clear  of any exudate or lesions.Normal dentition.  Neck: normal, supple, no masses, no thyromegaly Respiratory: clear to auscultation bilaterally, no wheezing, no crackles. Normal respiratory effort. No accessory muscle use.  Cardiovascular: Regular rate and rhythm, no murmurs / rubs / gallops. No extremity edema. 2+ pedal pulses. No carotid bruits.  Abdomen: no tenderness, no masses palpated. No hepatosplenomegaly. Bowel sounds positive.  Musculoskeletal: no clubbing / cyanosis. No joint deformity upper and lower extremities. Good ROM, no contractures. Normal muscle tone.  Skin: no rashes, lesions, ulcers. No induration Neurologic: CN 2-12 grossly intact. Sensation intact, DTR normal. Strength 5/5 in all 4.  Psychiatric: Normal judgment and insight.  Oriented to person and place, confused about time. Normal mood.     Labs on Admission: I have personally reviewed following labs and imaging studies  CBC: Recent Labs  Lab 04/11/23 0926  WBC 5.3  HGB 12.5  HCT 38.2  MCV 98.5  PLT 205   Basic Metabolic Panel: Recent Labs  Lab 04/11/23 0926  NA 138  K 3.7  CL 107  CO2 25  GLUCOSE 121*  BUN 14  CREATININE 0.81  CALCIUM 8.7*  MG 2.4   GFR: Estimated Creatinine Clearance: 48.6 mL/min (by C-G formula based on SCr of 0.81 mg/dL). Liver Function Tests: Recent Labs  Lab 04/11/23 0926  AST 15  ALT 13  ALKPHOS 48  BILITOT 1.2*  PROT 6.9  ALBUMIN 3.8   No results for input(s): "LIPASE", "AMYLASE" in the last 168 hours. No results for input(s): "AMMONIA" in the last 168 hours. Coagulation Profile: No results for input(s): "INR", "PROTIME" in the last 168 hours. Cardiac Enzymes: No results for input(s): "CKTOTAL", "CKMB", "CKMBINDEX", "TROPONINI" in the last 168 hours. BNP (last 3 results) No results for input(s): "PROBNP" in the last 8760 hours. HbA1C: No results for input(s): "HGBA1C" in the last 72 hours. CBG: No results for input(s): "GLUCAP" in the last 168 hours. Lipid  Profile: No results for input(s): "CHOL", "HDL", "LDLCALC", "TRIG", "CHOLHDL", "LDLDIRECT" in the last 72 hours. Thyroid Function Tests: No results for input(s): "TSH", "T4TOTAL", "FREET4", "T3FREE", "THYROIDAB" in the last 72 hours. Anemia Panel: No results for  input(s): "VITAMINB12", "FOLATE", "FERRITIN", "TIBC", "IRON", "RETICCTPCT" in the last 72 hours. Urine analysis:    Component Value Date/Time   COLORURINE YELLOW (A) 04/11/2023 0926   APPEARANCEUR HAZY (A) 04/11/2023 0926   APPEARANCEUR Clear 05/03/2021 1025   LABSPEC 1.011 04/11/2023 0926   PHURINE 7.0 04/11/2023 0926   GLUCOSEU NEGATIVE 04/11/2023 0926   HGBUR NEGATIVE 04/11/2023 0926   BILIRUBINUR NEGATIVE 04/11/2023 0926   BILIRUBINUR Negative 05/03/2021 1025   KETONESUR NEGATIVE 04/11/2023 0926   PROTEINUR NEGATIVE 04/11/2023 0926   NITRITE NEGATIVE 04/11/2023 0926   LEUKOCYTESUR LARGE (A) 04/11/2023 0926    Radiological Exams on Admission: CT Head Wo Contrast  Result Date: 04/11/2023 CLINICAL DATA:  Provided history: Mental status change, unknown cause. EXAM: CT HEAD WITHOUT CONTRAST TECHNIQUE: Contiguous axial images were obtained from the base of the skull through the vertex without intravenous contrast. RADIATION DOSE REDUCTION: This exam was performed according to the departmental dose-optimization program which includes automated exposure control, adjustment of the mA and/or kV according to patient size and/or use of iterative reconstruction technique. COMPARISON:  MRI 11/23/2020 FINDINGS: Brain: Generalized cerebral atrophy. Foci of chronic encephalomalacia/gliosis again noted within the anteroinferior frontal lobes and anterior temporal lobes bilaterally. Background patchy and ill-defined hypoattenuation within the cerebral white matter, nonspecific but compatible with mild chronic small vessel ischemic disease. There is no acute intracranial hemorrhage. No acute demarcated cortical infarct. No extra-axial fluid  collection. No evidence of an intracranial mass. No midline shift. Vascular: No hyperdense vessel.  Atherosclerotic calcifications. Skull: No calvarial fracture or aggressive osseous lesion. Sinuses/Orbits: No mass or acute finding within the imaged orbits. Severe bilateral maxillary sinusitis (with associated chronic reactive osteitis). Opacification of a single right ethmoid air cell. Other: Chronic fracture deformity of the posterolateral wall of the right maxillary sinus. Surgical fixation hardware along the anterior walls of both maxillary sinuses. IMPRESSION: 1. No evidence of an acute intracranial abnormality. 2. Foci of chronic encephalomalacia/gliosis again noted within the anteroinferior frontal lobes and anterior temporal lobes bilaterally. Findings are likely posttraumatic in etiology. 3. Background mild cerebral white matter chronic small vessel ischemic disease. 4. Generalized cerebral atrophy. 5. Paranasal sinus disease, as described. Electronically Signed   By: Jackey Loge D.O.   On: 04/11/2023 10:12    EKG: Independently reviewed.  Sinus rhythm, no acute ST changes.  Assessment/Plan Principal Problem:   Bradycardia Active Problems:   Sinus bradycardia  (please populate well all problems here in Problem List. (For example, if patient is on BP meds at home and you resume or decide to hold them, it is a problem that needs to be her. Same for CAD, COPD, HLD and so on)  AMS versus syncope -Mentation back to her normal as per son at bedside.  Not exactly clear what has happened this morning patient does not recall any abnormalities this morning. -CT head reassuring, neuroexam nonfocal, plan to hold off further MRI study. -As her mentation has cleared quickly after arrival in the ED, UTI less likely.  And UA showed equivocal for UTI, but patient does not have any urinary symptoms.  Decided to hold off further antibiotic treatment.  Discussed with the family member who  agreed.  Bradycardia -Medication induced, will hold off Cardizem and Coreg.  HTN, uncontrolled -Start amlodipine, can titrate up according to response -Start as needed hydralazine  Advanced dementia with sundowning -Agitated in the ED, start as needed Haldol -Start Seroquel at bedtime   DVT prophylaxis: Lovenox Code Status: Full code Family Communication: Son  at bedside Disposition Plan: Expect less than 2 midnight hospital stay Consults called: None Admission status: Tele obs   Emeline General MD [facility] Pager [phone]  04/11/2023, 1:59 PM

## 2023-04-11 NOTE — ED Notes (Addendum)
Pt agitated at this time. Refusing to stay in bed. Pt pulling leads of cardiac monitor off and refusing to stay in bed. Pt's sons at bedside attempting to assist pt back in bed. Pt refusing. MD made aware. EDP at bedside to assess pt at this time. Unable to be verbally deescalated by multiple RNs, EDP, and sons.

## 2023-04-11 NOTE — ED Notes (Signed)
Pt assisted to restroom, ambulatory.

## 2023-04-11 NOTE — ED Notes (Signed)
Pt to CT

## 2023-04-11 NOTE — ED Provider Notes (Signed)
[facility] Provider Note    Event Date/Time   First MD Initiated Contact with Patient 04/11/23 [phone]     (approximate)   History   Chief Complaint Altered Mental Status   HPI  [name] is a 89 y.o. female with past medical history of hypertension, CKD, bladder cancer, and MGUS who presents to the ED for altered mental status.  Per EMS, patient was last seen normal at 6 PM last night but when family who lives with the patient went to check on her this morning, found her to be much weaker and more confused than usual.  She reportedly communicates well at baseline and is ambulatory, but does have a history of Alzheimer's dementia.  On arrival to the ED, she is awake and alert, denies any complaints and is not sure why she is here.  She specifically denies any chest pain, shortness of breath, nausea, vomiting, diarrhea, abdominal pain, or dysuria.     Physical Exam   Triage Vital Signs: ED Triage Vitals [04/11/23 0921]  Encounter Vitals Group     BP      Systolic BP Percentile      Diastolic BP Percentile      Pulse      Resp      Temp      Temp src      SpO2      Weight 180 lb (81.6 kg)     Height      Head Circumference      Peak Flow      Pain Score 0     Pain Loc      Pain Education      Exclude from Growth Chart     Most recent vital signs: Vitals:   04/11/23 0926 04/11/23 1030  BP: (!) 175/85 (!) 190/78  Pulse: (!) 45 (!) 47  Resp: 18 15  Temp: 98 F (36.7 C)   SpO2: 98% 97%    Constitutional: Alert and oriented to person and place, but not time or situation. Eyes: Conjunctivae are normal. Head: Atraumatic. Nose: No congestion/rhinnorhea. Mouth/Throat: Mucous membranes are moist. Cardiovascular: Normal rate, regular rhythm. Grossly normal heart sounds.  2+ radial pulses bilaterally. Respiratory: Normal respiratory effort.  No retractions. Lungs CTAB. Gastrointestinal: Soft and nontender. No  distention. Musculoskeletal: No lower extremity tenderness nor edema.  Neurologic:  Normal speech and language. No gross focal neurologic deficits are appreciated.    ED Results / Procedures / Treatments   Labs (all labs ordered are listed, but only abnormal results are displayed) Labs Reviewed  COMPREHENSIVE METABOLIC PANEL - Abnormal; Notable for the following components:      Result Value   Glucose, Bld 121 (*)    Calcium 8.7 (*)    Total Bilirubin 1.2 (*)    All other components within normal limits  URINALYSIS, ROUTINE W REFLEX MICROSCOPIC - Abnormal; Notable for the following components:   Color, Urine YELLOW (*)    APPearance HAZY (*)    Leukocytes,Ua LARGE (*)    Bacteria, UA RARE (*)    All other components within normal limits  URINE CULTURE  CBC  MAGNESIUM  TROPONIN I (HIGH SENSITIVITY)     EKG  ED ECG REPORT I, Chesley Noon, the attending physician, personally viewed and interpreted this ECG.   Date: 04/11/2023  EKG Time: 9:26  Rate: 45  Rhythm: sinus bradycardia  Axis: Normal  Intervals:none  ST&T Change: None  RADIOLOGY CT head reviewed and  interpreted by me with no hemorrhage or midline shift.  PROCEDURES:  Critical Care performed: No  Procedures   MEDICATIONS ORDERED IN ED: Medications  cefTRIAXone (ROCEPHIN) 1 g in sodium chloride 0.9 % 100 mL IVPB (1 g Intravenous New Bag/Given 04/11/23 1121)     IMPRESSION / MDM / ASSESSMENT AND PLAN / ED COURSE  I reviewed the triage vital signs and the nursing notes.                              89 y.o. female with past medical history of hypertension, CKD, bladder cancer, and MGUS who presents to the ED for altered mental status and decreased responsiveness, last seen well last night.  Patient's presentation is most consistent with acute presentation with potential threat to life or bodily function.  Differential diagnosis includes, but is not limited to, stroke, TIA, sepsis, UTI, electrolyte  abnormality, AKI, anemia, dementia.  Patient nontoxic-appearing and in no acute distress, vital signs are remarkable for bradycardia and hypertension but otherwise reassuring.  She is alert and oriented x 2 on my assessment with no focal neurologic deficits.  We will check CT head, labs, and urinalysis.  No findings concerning for sepsis at this time.  CT head is negative for acute process, labs show no significant anemia, leukocytosis, electrolyte abnormality, or AKI.  Troponin also within normal limits but patient remains bradycardic in the 40s, does appear to take both metoprolol and diltiazem.  Son at bedside states she is back to her baseline mental status, but episode of decreased responsiveness earlier today may have been syncope related to bradycardia.  Urinalysis also concerning for UTI, we will send for culture and treat with IV Rocephin.  Case discussed with hospitalist for admission.      FINAL CLINICAL IMPRESSION(S) / ED DIAGNOSES   Final diagnoses:  Altered mental status, unspecified altered mental status type  Bradycardia  Acute cystitis without hematuria     Rx / DC Orders   ED Discharge Orders     None        Note:  This document was prepared using Dragon voice recognition software and may include unintentional dictation errors.   Chesley Noon, MD 04/11/23 1145

## 2023-04-11 NOTE — ED Notes (Signed)
Pt pushing at staff becoming verbally aggressive. Pt not following instructions. Sons of pt attempting to deescalate. Pt unable to be verbally deescalated. MD made aware of pt behavior and unable to deescalate.

## 2023-04-11 NOTE — ED Notes (Signed)
RN into answer call light. Pt unplugged from all monitoring equipment and in the bathroom. Sons of pt state they got her up to use the bathroom. RN educated pt's family that she is a fall risk and needs assistance to go to the bathroom to prevent falls. Pt back in bed and placed back on monitoring equipment. Brief placed on pt. Call light within reach.

## 2023-04-11 NOTE — ED Triage Notes (Signed)
Pt to ED [facility] from home, LKW 1800 last night. Cbg 116. Dementia at baseline. 20g to Left AC Pt alert on arrival, oriented to person. NAD noted. RR even and unlabored. Denies pain

## 2023-04-11 NOTE — ED Notes (Signed)
MD Sunnie Nielsen and paged for PRN medication for agitation.

## 2023-04-12 ENCOUNTER — Telehealth (HOSPITAL_COMMUNITY): Payer: Self-pay | Admitting: Pharmacy Technician

## 2023-04-12 ENCOUNTER — Other Ambulatory Visit (HOSPITAL_COMMUNITY): Payer: Self-pay

## 2023-04-12 ENCOUNTER — Encounter: Payer: Self-pay | Admitting: Oncology

## 2023-04-12 DIAGNOSIS — F02C0 Dementia in other diseases classified elsewhere, severe, without behavioral disturbance, psychotic disturbance, mood disturbance, and anxiety: Secondary | ICD-10-CM

## 2023-04-12 DIAGNOSIS — N3000 Acute cystitis without hematuria: Secondary | ICD-10-CM

## 2023-04-12 DIAGNOSIS — R001 Bradycardia, unspecified: Secondary | ICD-10-CM | POA: Diagnosis not present

## 2023-04-12 DIAGNOSIS — G301 Alzheimer's disease with late onset: Secondary | ICD-10-CM | POA: Diagnosis not present

## 2023-04-12 DIAGNOSIS — N390 Urinary tract infection, site not specified: Secondary | ICD-10-CM | POA: Insufficient documentation

## 2023-04-12 DIAGNOSIS — G9341 Metabolic encephalopathy: Secondary | ICD-10-CM

## 2023-04-12 DIAGNOSIS — F03C0 Unspecified dementia, severe, without behavioral disturbance, psychotic disturbance, mood disturbance, and anxiety: Secondary | ICD-10-CM | POA: Insufficient documentation

## 2023-04-12 MED ORDER — FOSFOMYCIN TROMETHAMINE 3 G PO PACK
3.0000 g | PACK | Freq: Once | ORAL | Status: DC
  Filled 2023-04-12: qty 3

## 2023-04-12 MED ORDER — AMLODIPINE BESYLATE 10 MG PO TABS: 10.0000 mg | ORAL_TABLET | Freq: Every day | ORAL | 0 refills | Status: DC

## 2023-04-12 MED ORDER — FOSFOMYCIN TROMETHAMINE 3 G PO PACK: 3.0000 g | PACK | Freq: Once | ORAL | 0 refills | Status: AC

## 2023-04-12 NOTE — Discharge Summary (Signed)
Physician Discharge Summary   Patient: [name] MRN: [id number] DOB: 22-Feb-1933  Admit date:     04/11/2023  Discharge date: 04/12/23  Discharge Physician: Marrion Coy   PCP: Lauro Regulus, MD   Recommendations at discharge:   Follow-up with PCP in 1 week.  Discharge Diagnoses: Principal Problem:   Bradycardia Active Problems:   Sinus bradycardia   Acute metabolic encephalopathy   UTI (urinary tract infection)   Advanced dementia (HCC)  Resolved Problems:   * No resolved hospital problems. *  Hospital Course: [name] is a 89 y.o. female with medical history significant of advanced dementia, HTN, brought in by family member for evaluation of altered mentations.  Patient mental status has been gradually getting worse since day saving time ended.  Worsening confusion yesterday.  UA showed significant white cells in the urine, patient has chronic incontinence of urine. She was given 1 dose of Rocephin while in the emergency room.  She was also had a significant elevation in blood pressure with significant sinus bradycardia. She was monitored overnight, she could not sleep last night due to new environment.  But she is not longer has any agitation.  Discussed with son, want to take patient home instead of facility.  Assessment and Plan: Acute metabolic cephalopathy most likely due to UTI. Urinary tract infection without hematuria. Advanced dementia with behavioral abnormality. Patient had a chronic decline of mental status for the last month, worsening mentation yesterday.  Abnormal urine, could not rule out UTI.  Patient received Rocephin, will give a dose of fosfomycin.  Patient does not have sepsis.  Sinus bradycardia. Essential hypertension. Amlodipine started, discontinued Coreg and diltiazem.  Follow-up with PCP as outpatient, may consider restart lower dose of Coreg in addition to amlodipine if blood pressure running high again.        Consultants: None Procedures performed: None  Disposition: Home Diet recommendation:  Discharge Diet Orders (From admission, onward)     Start     Ordered   04/12/23 0000  Diet - low sodium heart healthy        04/12/23 0947           Cardiac diet DISCHARGE MEDICATION: Allergies as of 04/12/2023       Reactions   Tramadol Nausea And Vomiting        Medication List     STOP taking these medications    carvedilol 12.5 MG tablet Commonly known as: COREG   diltiazem 240 MG 24 hr capsule Commonly known as: CARDIZEM CD       TAKE these medications    amLODipine 10 MG tablet Commonly known as: NORVASC Take 1 tablet (10 mg total) by mouth daily. Start taking on: April 13, 2023   calcium-vitamin D 500-200 MG-UNIT tablet Commonly known as: OSCAL WITH D Take 1 tablet by mouth daily.   cyanocobalamin 1000 MCG tablet Commonly known as: VITAMIN B12 Take 2,000 mcg by mouth daily.   donepezil 5 MG tablet Commonly known as: ARICEPT Take 5 mg by mouth at bedtime.   escitalopram 10 MG tablet Commonly known as: LEXAPRO Take 1 tablet by mouth daily.   latanoprost 0.005 % ophthalmic solution Commonly known as: XALATAN Place 1 drop into both eyes at bedtime.   pravastatin 40 MG tablet Commonly known as: PRAVACHOL Take 40 mg by mouth at bedtime.   raloxifene 60 MG tablet Commonly known as: EVISTA Take 60 mg by mouth daily.   Vitamin D3 25 MCG (1000 UT)  Caps Take 1,000 Units by mouth daily.        Follow-up Information     Lauro Regulus, MD Follow up in 1 week(s).   Specialty: Internal Medicine Contact information: [address] [facility] [place] [address] [phone]                Discharge Exam: Ceasar Mons Weights   04/11/23 9147 04/11/23 1300  Weight: 81.6 kg 81.6 kg   General exam: Appears calm and comfortable  Respiratory system: Clear to auscultation. Respiratory effort normal. Cardiovascular  system: S1 & S2 heard, RRR. No JVD, murmurs, rubs, gallops or clicks. No pedal edema. Gastrointestinal system: Abdomen is nondistended, soft and nontender. No organomegaly or masses felt. Normal bowel sounds heard. Central nervous system: Alert and oriented x1. No focal neurological deficits. Extremities: Symmetric 5 x 5 power. Skin: No rashes, lesions or ulcers Psychiatry:  Mood & affect appropriate.    Condition at discharge: fair  The results of significant diagnostics from this hospitalization (including imaging, microbiology, ancillary and laboratory) are listed below for reference.   Imaging Studies: CT Head Wo Contrast  Result Date: 04/11/2023 CLINICAL DATA:  Provided history: Mental status change, unknown cause. EXAM: CT HEAD WITHOUT CONTRAST TECHNIQUE: Contiguous axial images were obtained from the base of the skull through the vertex without intravenous contrast. RADIATION DOSE REDUCTION: This exam was performed according to the departmental dose-optimization program which includes automated exposure control, adjustment of the mA and/or kV according to patient size and/or use of iterative reconstruction technique. COMPARISON:  MRI 11/23/2020 FINDINGS: Brain: Generalized cerebral atrophy. Foci of chronic encephalomalacia/gliosis again noted within the anteroinferior frontal lobes and anterior temporal lobes bilaterally. Background patchy and ill-defined hypoattenuation within the cerebral white matter, nonspecific but compatible with mild chronic small vessel ischemic disease. There is no acute intracranial hemorrhage. No acute demarcated cortical infarct. No extra-axial fluid collection. No evidence of an intracranial mass. No midline shift. Vascular: No hyperdense vessel.  Atherosclerotic calcifications. Skull: No calvarial fracture or aggressive osseous lesion. Sinuses/Orbits: No mass or acute finding within the imaged orbits. Severe bilateral maxillary sinusitis (with associated chronic  reactive osteitis). Opacification of a single right ethmoid air cell. Other: Chronic fracture deformity of the posterolateral wall of the right maxillary sinus. Surgical fixation hardware along the anterior walls of both maxillary sinuses. IMPRESSION: 1. No evidence of an acute intracranial abnormality. 2. Foci of chronic encephalomalacia/gliosis again noted within the anteroinferior frontal lobes and anterior temporal lobes bilaterally. Findings are likely posttraumatic in etiology. 3. Background mild cerebral white matter chronic small vessel ischemic disease. 4. Generalized cerebral atrophy. 5. Paranasal sinus disease, as described. Electronically Signed   By: Jackey Loge D.O.   On: 04/11/2023 10:12    Microbiology: Results for orders placed or performed in visit on 05/03/21  Microscopic Examination     Status: Abnormal   Collection Time: 05/03/21 10:25 AM   Urine  Result Value Ref Range Status   WBC, UA 6-10 (A) 0 - 5 /hpf Final   RBC, Urine 0-2 0 - 2 /hpf Final   Epithelial Cells (non renal) 0-10 0 - 10 /hpf Final   Bacteria, UA Moderate (A) None seen/Few Final  CULTURE, URINE COMPREHENSIVE     Status: Abnormal   Collection Time: 05/03/21 10:43 AM   Specimen: Urine   UR  Result Value Ref Range Status   Urine Culture, Comprehensive Final report (A)  Final   Organism ID, Bacteria Escherichia coli (A)  Final    Comment: Cefazolin <=4 ug/mL Cefazolin with an MIC <=16 predicts susceptibility to the oral agents cefaclor, cefdinir, cefpodoxime, cefprozil, cefuroxime, cephalexin, and loracarbef when used for therapy of uncomplicated urinary tract infections due to E. coli, Klebsiella pneumoniae, and Proteus mirabilis. Greater than 100,000 colony forming units per mL    ANTIMICROBIAL SUSCEPTIBILITY Comment  Final    Comment:       ** S = Susceptible; I = Intermediate; R = Resistant **                    P = Positive; N = Negative             MICS are expressed in micrograms per mL     Antibiotic                 RSLT#1    RSLT#2    RSLT#3    RSLT#4 Amoxicillin/Clavulanic Acid    S Ampicillin                     R Cefepime                       S Ceftriaxone                    S Cefuroxime                     S Ciprofloxacin                  S Ertapenem                      S Gentamicin                     S Imipenem                       S Levofloxacin                   S Meropenem                      S Nitrofurantoin                 S Piperacillin/Tazobactam        S Tetracycline                   S Tobramycin                     S Trimethoprim/Sulfa             S     Labs: CBC: Recent Labs  Lab 04/11/23 0926  WBC 5.3  HGB 12.5  HCT 38.2  MCV 98.5  PLT 205   Basic Metabolic Panel: Recent Labs  Lab 04/11/23 0926  NA 138  K 3.7  CL 107  CO2 25  GLUCOSE 121*  BUN 14  CREATININE 0.81  CALCIUM 8.7*  MG 2.4   Liver Function Tests: Recent Labs  Lab 04/11/23 0926  AST 15  ALT 13  ALKPHOS 48  BILITOT 1.2*  PROT 6.9  ALBUMIN 3.8   CBG: No results for input(s): "GLUCAP" in the last 168 hours.  Discharge time spent: greater than 30 minutes.  Signed: Marrion Coy, MD [facility] 04/12/2023

## 2023-04-12 NOTE — Plan of Care (Signed)

## 2023-04-12 NOTE — Telephone Encounter (Signed)
Pharmacy Patient Advocate Encounter   Received notification from Fax that prior authorization for Fosfomycin Tromethamine 3GM packets  is required/requested.   Insurance verification completed.   The patient is insured through [employer] [facility] [employer] [facility] .   Per test claim: PA required; PA started via CoverMyMeds. KEY [id number] . Waiting for clinical questions to populate.

## 2023-04-12 NOTE — Plan of Care (Signed)

## 2023-04-12 NOTE — Telephone Encounter (Signed)
 Clinical Questions have been submitted

## 2023-04-13 LAB — URINE CULTURE: Culture: 70000 — AB

## 2023-04-15 NOTE — Telephone Encounter (Signed)
Pharmacy Patient Advocate Encounter  Received notification from [facility] that Prior Authorization for Fosfomycin Tromethamine 3GM packets  has been APPROVED from 04/12/2023 to 04/11/2024   PA #/Case ID/Reference #: [id number]

## 2023-04-26 ENCOUNTER — Encounter: Payer: Self-pay | Admitting: Internal Medicine

## 2023-04-26 ENCOUNTER — Other Ambulatory Visit: Payer: Self-pay

## 2023-04-26 ENCOUNTER — Inpatient Hospital Stay: Payer: Medicare Other

## 2023-04-26 ENCOUNTER — Inpatient Hospital Stay
Admission: EM | Admit: 2023-04-26 | Discharge: 2023-05-04 | DRG: 371 | Disposition: A | Discharge status: Hospice | Payer: Medicare Other | Attending: Internal Medicine | Admitting: Internal Medicine

## 2023-04-26 DIAGNOSIS — G9341 Metabolic encephalopathy: Secondary | ICD-10-CM | POA: Diagnosis present

## 2023-04-26 DIAGNOSIS — R627 Adult failure to thrive: Secondary | ICD-10-CM | POA: Diagnosis present

## 2023-04-26 DIAGNOSIS — Z6828 Body mass index (BMI) 28.0-28.9, adult: Secondary | ICD-10-CM | POA: Diagnosis not present

## 2023-04-26 DIAGNOSIS — I2510 Atherosclerotic heart disease of native coronary artery without angina pectoris: Secondary | ICD-10-CM | POA: Diagnosis present

## 2023-04-26 DIAGNOSIS — E860 Dehydration: Secondary | ICD-10-CM | POA: Diagnosis present

## 2023-04-26 DIAGNOSIS — Z885 Allergy status to narcotic agent status: Secondary | ICD-10-CM

## 2023-04-26 DIAGNOSIS — I10 Essential (primary) hypertension: Secondary | ICD-10-CM | POA: Diagnosis present

## 2023-04-26 DIAGNOSIS — Z66 Do not resuscitate: Secondary | ICD-10-CM | POA: Diagnosis present

## 2023-04-26 DIAGNOSIS — Z515 Encounter for palliative care: Secondary | ICD-10-CM

## 2023-04-26 DIAGNOSIS — Z9221 Personal history of antineoplastic chemotherapy: Secondary | ICD-10-CM | POA: Diagnosis not present

## 2023-04-26 DIAGNOSIS — E785 Hyperlipidemia, unspecified: Secondary | ICD-10-CM | POA: Diagnosis present

## 2023-04-26 DIAGNOSIS — G9340 Encephalopathy, unspecified: Secondary | ICD-10-CM | POA: Diagnosis present

## 2023-04-26 DIAGNOSIS — R8281 Pyuria: Secondary | ICD-10-CM | POA: Insufficient documentation

## 2023-04-26 DIAGNOSIS — Z981 Arthrodesis status: Secondary | ICD-10-CM

## 2023-04-26 DIAGNOSIS — Z8661 Personal history of infections of the central nervous system: Secondary | ICD-10-CM

## 2023-04-26 DIAGNOSIS — Z9181 History of falling: Secondary | ICD-10-CM | POA: Diagnosis not present

## 2023-04-26 DIAGNOSIS — Z9842 Cataract extraction status, left eye: Secondary | ICD-10-CM

## 2023-04-26 DIAGNOSIS — Z8711 Personal history of peptic ulcer disease: Secondary | ICD-10-CM

## 2023-04-26 DIAGNOSIS — Z79899 Other long term (current) drug therapy: Secondary | ICD-10-CM

## 2023-04-26 DIAGNOSIS — Z96653 Presence of artificial knee joint, bilateral: Secondary | ICD-10-CM | POA: Diagnosis present

## 2023-04-26 DIAGNOSIS — E876 Hypokalemia: Secondary | ICD-10-CM | POA: Diagnosis present

## 2023-04-26 DIAGNOSIS — Z9841 Cataract extraction status, right eye: Secondary | ICD-10-CM

## 2023-04-26 DIAGNOSIS — Z833 Family history of diabetes mellitus: Secondary | ICD-10-CM

## 2023-04-26 DIAGNOSIS — D509 Iron deficiency anemia, unspecified: Secondary | ICD-10-CM | POA: Diagnosis present

## 2023-04-26 DIAGNOSIS — A0472 Enterocolitis due to Clostridium difficile, not specified as recurrent: Secondary | ICD-10-CM | POA: Diagnosis present

## 2023-04-26 DIAGNOSIS — R630 Anorexia: Secondary | ICD-10-CM | POA: Diagnosis present

## 2023-04-26 DIAGNOSIS — Z8249 Family history of ischemic heart disease and other diseases of the circulatory system: Secondary | ICD-10-CM | POA: Diagnosis not present

## 2023-04-26 DIAGNOSIS — Z7189 Other specified counseling: Secondary | ICD-10-CM | POA: Diagnosis not present

## 2023-04-26 DIAGNOSIS — H9190 Unspecified hearing loss, unspecified ear: Secondary | ICD-10-CM | POA: Diagnosis present

## 2023-04-26 DIAGNOSIS — F02C0 Dementia in other diseases classified elsewhere, severe, without behavioral disturbance, psychotic disturbance, mood disturbance, and anxiety: Secondary | ICD-10-CM | POA: Diagnosis not present

## 2023-04-26 DIAGNOSIS — Z8551 Personal history of malignant neoplasm of bladder: Secondary | ICD-10-CM

## 2023-04-26 DIAGNOSIS — F419 Anxiety disorder, unspecified: Secondary | ICD-10-CM | POA: Diagnosis present

## 2023-04-26 DIAGNOSIS — G301 Alzheimer's disease with late onset: Secondary | ICD-10-CM | POA: Diagnosis not present

## 2023-04-26 DIAGNOSIS — Z860100 Personal history of colon polyps, unspecified: Secondary | ICD-10-CM | POA: Diagnosis not present

## 2023-04-26 DIAGNOSIS — Z90710 Acquired absence of both cervix and uterus: Secondary | ICD-10-CM

## 2023-04-26 DIAGNOSIS — Z974 Presence of external hearing-aid: Secondary | ICD-10-CM | POA: Diagnosis not present

## 2023-04-26 DIAGNOSIS — F03911 Unspecified dementia, unspecified severity, with agitation: Secondary | ICD-10-CM | POA: Diagnosis present

## 2023-04-26 DIAGNOSIS — I779 Disorder of arteries and arterioles, unspecified: Secondary | ICD-10-CM | POA: Diagnosis present

## 2023-04-26 DIAGNOSIS — R197 Diarrhea, unspecified: Principal | ICD-10-CM | POA: Insufficient documentation

## 2023-04-26 DIAGNOSIS — F03C0 Unspecified dementia, severe, without behavioral disturbance, psychotic disturbance, mood disturbance, and anxiety: Secondary | ICD-10-CM | POA: Diagnosis present

## 2023-04-26 DIAGNOSIS — R001 Bradycardia, unspecified: Secondary | ICD-10-CM | POA: Diagnosis present

## 2023-04-26 DIAGNOSIS — Z825 Family history of asthma and other chronic lower respiratory diseases: Secondary | ICD-10-CM

## 2023-04-26 DIAGNOSIS — Z8719 Personal history of other diseases of the digestive system: Secondary | ICD-10-CM

## 2023-04-26 LAB — COMPREHENSIVE METABOLIC PANEL
ALT: 12 U/L (ref 0–44)
AST: 16 U/L (ref 15–41)
Albumin: 3.6 g/dL (ref 3.5–5.0)
Alkaline Phosphatase: 48 U/L (ref 38–126)
Anion gap: 8 (ref 5–15)
BUN: 12 mg/dL (ref 8–23)
CO2: 24 mmol/L (ref 22–32)
Calcium: 8.8 mg/dL — ABNORMAL LOW (ref 8.9–10.3)
Chloride: 104 mmol/L (ref 98–111)
Creatinine, Ser: 0.91 mg/dL (ref 0.44–1.00)
GFR, Estimated: >60 mL/min (ref >60)
Glucose, Bld: 109 mg/dL — ABNORMAL HIGH (ref 70–99)
Potassium: 3.4 mmol/L — ABNORMAL LOW (ref 3.5–5.1)
Sodium: 136 mmol/L (ref 135–145)
Total Bilirubin: 1.2 mg/dL — ABNORMAL HIGH (ref <1.2)
Total Protein: 7 g/dL (ref 6.5–8.1)

## 2023-04-26 LAB — URINALYSIS, ROUTINE W REFLEX MICROSCOPIC
Bacteria, UA: NONE SEEN
Bilirubin Urine: NEGATIVE
Glucose, UA: NEGATIVE mg/dL
Hgb urine dipstick: NEGATIVE
Ketones, ur: 5 mg/dL — AB
Nitrite: NEGATIVE
Protein, ur: NEGATIVE mg/dL
Specific Gravity, Urine: 1.006 (ref 1.005–1.030)
pH: 9 — ABNORMAL HIGH (ref 5.0–8.0)

## 2023-04-26 LAB — C DIFFICILE QUICK SCREEN W PCR REFLEX
C Diff antigen: POSITIVE — AB
C Diff interpretation: DETECTED
C Diff toxin: POSITIVE — AB

## 2023-04-26 LAB — CBC
HCT: 37.4 % (ref 36.0–46.0)
Hemoglobin: 12.1 g/dL (ref 12.0–15.0)
MCH: 32 pg (ref 26.0–34.0)
MCHC: 32.4 g/dL (ref 30.0–36.0)
MCV: 98.9 fL (ref 80.0–100.0)
Platelets: 244 10*3/uL (ref 150–400)
RBC: 3.78 MIL/uL — ABNORMAL LOW (ref 3.87–5.11)
RDW: 12.8 % (ref 11.5–15.5)
WBC: 5.6 10*3/uL (ref 4.0–10.5)
nRBC: 0 % (ref 0.0–0.2)

## 2023-04-26 LAB — LIPASE, BLOOD: Lipase: 36 U/L (ref 11–51)

## 2023-04-26 MED ORDER — ESCITALOPRAM OXALATE 10 MG PO TABS
10.0000 mg | ORAL_TABLET | Freq: Every day | ORAL | Status: DC
  Administered 2023-04-26 – 2023-04-28 (×3): 10 mg via ORAL
  Filled 2023-04-26 (×3): qty 1

## 2023-04-26 MED ORDER — ACETAMINOPHEN 325 MG PO TABS: 650.0000 mg | ORAL_TABLET | Freq: Four times a day (QID) | ORAL | Status: AC | PRN

## 2023-04-26 MED ORDER — PRAVASTATIN SODIUM 20 MG PO TABS
40.0000 mg | ORAL_TABLET | Freq: Every day | ORAL | Status: DC
  Administered 2023-04-26 – 2023-04-29 (×4): 40 mg via ORAL
  Filled 2023-04-26 (×4): qty 2

## 2023-04-26 MED ORDER — ONDANSETRON HCL 4 MG PO TABS: 4.0000 mg | ORAL_TABLET | Freq: Four times a day (QID) | ORAL | Status: AC | PRN

## 2023-04-26 MED ORDER — OYSTER SHELL CALCIUM/D3 500-5 MG-MCG PO TABS
1.0000 | ORAL_TABLET | Freq: Every day | ORAL | Status: DC
  Administered 2023-04-27 – 2023-04-30 (×4): 1 via ORAL
  Filled 2023-04-26 (×4): qty 1

## 2023-04-26 MED ORDER — VITAMIN B-12 1000 MCG PO TABS
2000.0000 ug | ORAL_TABLET | Freq: Every day | ORAL | Status: DC
Start: 2023-04-27 — End: 2023-04-30
  Administered 2023-04-27 – 2023-04-30 (×4): 2000 ug via ORAL
  Filled 2023-04-26 (×4): qty 2

## 2023-04-26 MED ORDER — HYDRALAZINE HCL 20 MG/ML IJ SOLN: 5.0000 mg | Freq: Four times a day (QID) | INTRAMUSCULAR | Status: DC | PRN

## 2023-04-26 MED ORDER — ONDANSETRON HCL 4 MG/2ML IJ SOLN
4.0000 mg | Freq: Four times a day (QID) | INTRAMUSCULAR | Status: AC | PRN
Start: 2023-04-26 — End: 2023-05-01

## 2023-04-26 MED ORDER — HEPARIN SODIUM (PORCINE) 5000 UNIT/ML IJ SOLN
5000.0000 [IU] | Freq: Three times a day (TID) | INTRAMUSCULAR | Status: DC
  Administered 2023-04-26 – 2023-04-30 (×11): 5000 [IU] via SUBCUTANEOUS
  Filled 2023-04-26 (×10): qty 1

## 2023-04-26 MED ORDER — VITAMIN D 25 MCG (1000 UNIT) PO TABS
1000.0000 [IU] | ORAL_TABLET | Freq: Every day | ORAL | Status: DC
  Administered 2023-04-27 – 2023-04-30 (×4): 1000 [IU] via ORAL
  Filled 2023-04-26 (×4): qty 1

## 2023-04-26 MED ORDER — DONEPEZIL HCL 5 MG PO TABS
5.0000 mg | ORAL_TABLET | Freq: Every day | ORAL | Status: DC
  Administered 2023-04-26 – 2023-04-29 (×4): 5 mg via ORAL
  Filled 2023-04-26 (×4): qty 1

## 2023-04-26 MED ORDER — HALOPERIDOL LACTATE 5 MG/ML IJ SOLN
5.0000 mg | Freq: Once | INTRAMUSCULAR | Status: AC
  Administered 2023-04-26: 5 mg via INTRAVENOUS
  Filled 2023-04-26: qty 1

## 2023-04-26 MED ORDER — HALOPERIDOL LACTATE 5 MG/ML IJ SOLN
2.5000 mg | Freq: Once | INTRAMUSCULAR | Status: AC
  Administered 2023-04-26: 2.5 mg via INTRAVENOUS
  Filled 2023-04-26: qty 1

## 2023-04-26 MED ORDER — LORAZEPAM 2 MG/ML IJ SOLN
1.0000 mg | Freq: Once | INTRAMUSCULAR | Status: AC
  Administered 2023-04-26: 1 mg via INTRAVENOUS
  Filled 2023-04-26: qty 1

## 2023-04-26 MED ORDER — FIDAXOMICIN 200 MG PO TABS
200.0000 mg | ORAL_TABLET | Freq: Two times a day (BID) | ORAL | Status: DC
  Administered 2023-04-26 – 2023-04-28 (×6): 200 mg via ORAL
  Filled 2023-04-26 (×7): qty 1

## 2023-04-26 MED ORDER — LATANOPROST 0.005 % OP SOLN
1.0000 [drp] | Freq: Every day | OPHTHALMIC | Status: DC
  Administered 2023-04-26 – 2023-04-29 (×4): 1 [drp] via OPHTHALMIC
  Filled 2023-04-26 (×2): qty 2.5

## 2023-04-26 MED ORDER — AMLODIPINE BESYLATE 10 MG PO TABS
10.0000 mg | ORAL_TABLET | Freq: Every day | ORAL | Status: DC
  Administered 2023-04-26 – 2023-04-30 (×5): 10 mg via ORAL
  Filled 2023-04-26 (×5): qty 1
  Filled 2023-04-26: qty 2
  Filled 2023-04-26: qty 1

## 2023-04-26 MED ORDER — LORAZEPAM 2 MG/ML IJ SOLN
0.5000 mg | Freq: Four times a day (QID) | INTRAMUSCULAR | Status: DC | PRN
  Administered 2023-04-26: 0.5 mg via INTRAVENOUS
  Filled 2023-04-26: qty 1

## 2023-04-26 MED ORDER — ACETAMINOPHEN 650 MG RE SUPP: 650.0000 mg | Freq: Four times a day (QID) | RECTAL | Status: AC | PRN

## 2023-04-26 MED ORDER — LORAZEPAM 2 MG/ML IJ SOLN: 1.0000 mg | Freq: Four times a day (QID) | INTRAMUSCULAR | Status: AC | PRN

## 2023-04-26 MED ORDER — RALOXIFENE HCL 60 MG PO TABS
60.0000 mg | ORAL_TABLET | Freq: Every day | ORAL | Status: DC
  Administered 2023-04-26 – 2023-04-30 (×5): 60 mg via ORAL
  Filled 2023-04-26 (×5): qty 1

## 2023-04-26 MED ORDER — LACTATED RINGERS IV SOLN: INTRAVENOUS | Status: DC

## 2023-04-26 MED ORDER — SODIUM CHLORIDE 0.9 % IV BOLUS
1000.0000 mL | Freq: Once | INTRAVENOUS | Status: AC
  Administered 2023-04-26: 1000 mL via INTRAVENOUS

## 2023-04-26 NOTE — ED Provider Notes (Signed)
[facility] Provider Note    Event Date/Time   First MD Initiated Contact with Patient 04/26/23 [phone]     (approximate)   History   Chief Complaint: Diarrhea   HPI  STONEY VANNESTE is a 89 y.o. female with a history of CAD, hyperlipidemia, hypertension, dementia who was brought to the ED due to generalized weakness, loss of appetite and poor oral intake, and copious diarrhea that is developed over the last 3 days.  Reviewing outside records, patient was hospitalized about a month ago for UTI and treated with antibiotics including Rocephin IV and fosfomycin.  Patient denies acute symptoms, but has chronic confusion and disorientation related to dementia.          Physical Exam   Triage Vital Signs: ED Triage Vitals  Encounter Vitals Group     BP 04/26/23 0947 (!) 149/62     Systolic BP Percentile --      Diastolic BP Percentile --      Pulse Rate 04/26/23 0947 63     Resp 04/26/23 0947 16     Temp 04/26/23 0947 98.7 F (37.1 C)     Temp Source 04/26/23 0947 Oral     SpO2 04/26/23 0947 99 %     Weight 04/26/23 0948 179 lb 14.3 oz (81.6 kg)     Height 04/26/23 0948 5\' 2"  (1.575 m)     Head Circumference --      Peak Flow --      Pain Score 04/26/23 0948 0     Pain Loc --      Pain Education --      Exclude from Growth Chart --     Most recent vital signs: Vitals:   04/26/23 1146 04/26/23 1200  BP: (!) 155/77   Pulse: 70 88  Resp: 20   Temp:    SpO2: 100% 100%    General: Awake, no distress.  CV:  Good peripheral perfusion.  Regular rate and rhythm Resp:  Normal effort.  Clear to auscultation bilaterally Abd:  No distention.  Soft, nontender Other:  Dry oral mucosa,   ED Results / Procedures / Treatments   Labs (all labs ordered are listed, but only abnormal results are displayed) Labs Reviewed  C DIFFICILE QUICK SCREEN W PCR REFLEX   - Abnormal; Notable for the following components:      Result Value   C Diff antigen  POSITIVE (*)    C Diff toxin POSITIVE (*)    All other components within normal limits  COMPREHENSIVE METABOLIC PANEL - Abnormal; Notable for the following components:   Potassium 3.4 (*)    Glucose, Bld 109 (*)    Calcium 8.8 (*)    Total Bilirubin 1.2 (*)    All other components within normal limits  CBC - Abnormal; Notable for the following components:   RBC 3.78 (*)    All other components within normal limits  URINALYSIS, ROUTINE W REFLEX MICROSCOPIC - Abnormal; Notable for the following components:   Color, Urine STRAW (*)    APPearance CLEAR (*)    pH 9.0 (*)    Ketones, ur 5 (*)    Leukocytes,Ua TRACE (*)    All other components within normal limits  LIPASE, BLOOD     EKG    RADIOLOGY    PROCEDURES:  Procedures   MEDICATIONS ORDERED IN ED: Medications  acetaminophen (TYLENOL) tablet 650 mg (has no administration in time range)    Or  acetaminophen (TYLENOL) suppository 650 mg (has no administration in time range)  ondansetron (ZOFRAN) tablet 4 mg (has no administration in time range)    Or  ondansetron (ZOFRAN) injection 4 mg (has no administration in time range)  heparin injection 5,000 Units (has no administration in time range)  fidaxomicin (DIFICID) tablet 200 mg (has no administration in time range)  lactated ringers infusion (has no administration in time range)  hydrALAZINE (APRESOLINE) injection 5 mg (has no administration in time range)  LORazepam (ATIVAN) injection 0.5 mg (has no administration in time range)  sodium chloride 0.9 % bolus 1,000 mL (0 mLs Intravenous Stopped 04/26/23 1314)  haloperidol lactate (HALDOL) injection 2.5 mg (2.5 mg Intravenous Given 04/26/23 1117)  LORazepam (ATIVAN) injection 1 mg (1 mg Intravenous Given 04/26/23 1141)     IMPRESSION / MDM / ASSESSMENT AND PLAN / ED COURSE  I reviewed the triage vital signs and the nursing notes.  DDx: Recurrent UTI, C. difficile colitis, AKI, electrolyte abnormality, anemia,  deconditioning, progressive dementia  Patient's presentation is most consistent with acute presentation with potential threat to life or bodily function.  Patient presents with generalized weakness, poor oral intake, and copious diarrhea that is worsened over the last few days according to family at bedside.  At risk for C. difficile due to recent antibiotic use and hospitalization.  Labs obtained which are reassuring, patient given IV fluids for hydration.  C. difficile PCR positive, will need to hospitalize for initial management until symptoms improving.   Clinical Course as of 04/26/23 1433  Fri Apr 26, 2023  1421 D/w hospitalist, will eval.  [PS]    Clinical Course User Index [PS] Sharman Cheek, MD     FINAL CLINICAL IMPRESSION(S) / ED DIAGNOSES   Final diagnoses:  Diarrhea in adult patient  Dementia with agitation, unspecified dementia severity, unspecified dementia type (HCC)  C. difficile colitis     Rx / DC Orders   ED Discharge Orders     None        Note:  This document was prepared using Dragon voice recognition software and may include unintentional dictation errors.   Sharman Cheek, MD 04/26/23 [phone]

## 2023-04-26 NOTE — ED Notes (Signed)
Pt still pulling at wires and IV line. Pt given activity mat to see if that will help. Son at bedside.

## 2023-04-26 NOTE — ED Notes (Signed)
ED TO INPATIENT HANDOFF REPORT  ED Nurse Name and Phone #: Lanora Manis [phone]  S Name/Age/Gender [name] 89 y.o. female Room/Bed: ED18A/ED18A  Code Status   Code Status: Full Code  Home/SNF/Other Nursing Home Patient oriented to: self Is this baseline? Yes   Triage Complete: Triage complete  Chief Complaint Encephalopathy acute [G93.40]  Triage Note Pt arrives via ems from home, family called 911 due to ot having ongoing diarrhea that started approx 45 min ago, pt has a hx of dementia and family stated to ems attempts started for placement   Ems 156/71 72p 100% ra  99.2 A temp 141 cbg   Allergies Allergies  Allergen Reactions   Tramadol Nausea And Vomiting    Level of Care/Admitting Diagnosis ED Disposition     ED Disposition  Admit   Condition  --   Comment  Hospital Area: [facility] [[id number]]  Level of Care: Telemetry Medical [104]  Covid Evaluation: Asymptomatic - no recent exposure (last 10 days) testing not required  Diagnosis: Encephalopathy acute [247508]  Admitting Physician: Lovenia Kim [[id number]]  Attending Physician: [facility], AMY N [id number]  Certification:: I certify this patient will need inpatient services for at least 2 midnights  Expected Medical Readiness: 04/29/2023          B Medical/Surgery History Past Medical History:  Diagnosis Date   Anemia    Anemia    Bladder cancer (HCC)    Cancer (HCC) 2016   bladder tumor   Carotid arterial disease (HCC) 01/09/2014   Overview:  Minimal on screening    Chronic low back pain    Colon polyp    DDD (degenerative disc disease), cervical    DDD (degenerative disc disease), lumbar    Elevated lipids    Erosive esophagitis    Foot pain    GERD (gastroesophageal reflux disease)    Glaucoma    H/O concussion 04/2012   Due to fall at [facility]   Heart murmur    History of bone density study    History of chickenpox    HOH (hard of hearing)     Bilateral  Hearing Aids   Hypertension    Lower back pain    Lower extremity edema    Multinodular thyroid    Multiple gastric ulcers    Osteoporosis    Personal history of chemotherapy    bladder   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HCC)    Thyroid disease    Past Surgical History:  Procedure Laterality Date   ABDOMINAL HYSTERECTOMY  1973   APPLICATION OF WOUND VAC Right 01/17/2017   Procedure: APPLICATION OF WOUND VAC;  Surgeon: Kennedy Bucker, MD;  Location: [facility];  Service: Orthopedics;  Laterality: Right;   BACK SURGERY     03/1975-ruptured disk, 11/1983-ruptured disk, 11/1984-ruptured disk, 10/1996-ruptured disk, 08/1997-spinal fusion, 02/10/07-spinal fusion [facility]   BACK SURGERY     Spinal Fusion X 2   CARPAL TUNNEL RELEASE Right    CATARACT EXTRACTION Bilateral    CYSTOSCOPY W/ RETROGRADES Bilateral 09/29/2014   Procedure: CYSTOSCOPY WITH RETROGRADE PYELOGRAM;  Surgeon: Vanna Scotland, MD;  Location: [facility];  Service: Urology;  Laterality: Bilateral;   CYSTOSCOPY WITH BIOPSY N/A 11/02/2014   Procedure: CYSTOSCOPY WITH BIOPSY/WITH MITOMYCIN;  Surgeon: Vanna Scotland, MD;  Location: [facility];  Service: Urology;  Laterality: N/A;   ESOPHAGOGASTRODUODENOSCOPY (EGD) WITH PROPOFOL N/A 05/10/2015   Procedure: ESOPHAGOGASTRODUODENOSCOPY (EGD) WITH PROPOFOL;  Surgeon: Christena Deem, MD;  Location: [facility];  Service: Endoscopy;  Laterality: N/A;   ESOPHAGOGASTRODUODENOSCOPY (EGD) WITH PROPOFOL N/A 01/08/2018   Procedure: ESOPHAGOGASTRODUODENOSCOPY (EGD) WITH PROPOFOL;  Surgeon: Toledo, Boykin Nearing, MD;  Location: [facility];  Service: Gastroenterology;  Laterality: N/A;   EYE SURGERY Bilateral    Cataract Extraction with IOL   FOOT SURGERY Right    INCISION AND DRAINAGE ABSCESS Right 11/20/2016   Procedure: INCISION AND DRAINAGE ABSCESS GLUTEAL;  Surgeon: Kieth Brightly, MD;  Location: [facility];  Service: General;  Laterality: Right;   INCISION AND DRAINAGE ABSCESS Right 01/17/2017    Procedure: INCISION AND DRAINAGE GLUTEAL ABSCESS;  Surgeon: Kennedy Bucker, MD;  Location: [facility];  Service: Orthopedics;  Laterality: Right;   IRRIGATION AND DEBRIDEMENT BUTTOCKS Right 10/21/2015   Procedure: DRAINAGE GLUTEAL ABSCESS;  Surgeon: Kieth Brightly, MD;  Location: [facility];  Service: General;  Laterality: Right;   KNEE ARTHROPLASTY Left 01/10/2018   Procedure: COMPUTER ASSISTED TOTAL KNEE ARTHROPLASTY;  Surgeon: Donato Heinz, MD;  Location: [facility];  Service: Orthopedics;  Laterality: Left;   KNEE ARTHROPLASTY Right 06/16/2018   Procedure: COMPUTER ASSISTED TOTAL KNEE ARTHROPLASTY;  Surgeon: Donato Heinz, MD;  Location: [facility];  Service: Orthopedics;  Laterality: Right;   KNEE ARTHROSCOPY Right    x2   MANDIBLE SURGERY Bilateral    x2   PICC LINE INSERTION Right 01/08/2017   SHOULDER ARTHROSCOPY WITH ROTATOR CUFF REPAIR AND SUBACROMIAL DECOMPRESSION Right 05/25/2015   Procedure: SHOULDER ARTHROSCOPY WITH ROTATOR CUFF REPAIR AND SUBACROMIAL DECOMPRESSION, release long head biceps tendon;  Surgeon: Erin Sons, MD;  Location: [facility];  Service: Orthopedics;  Laterality: Right;   TRANSURETHRAL RESECTION OF BLADDER TUMOR N/A 09/29/2014   Procedure: TRANSURETHRAL RESECTION OF BLADDER TUMOR (TURBT);  Surgeon: Vanna Scotland, MD;  Location: [facility];  Service: Urology;  Laterality: N/A;   WRIST FRACTURE SURGERY Left      A IV Location/Drains/Wounds Patient Lines/Drains/Airways Status     Active Line/Drains/Airways     Name Placement date Placement time Site Days   Peripheral IV 04/26/23 20 G Left Antecubital 04/26/23  1052  Antecubital  less than 1            Intake/Output Last 24 hours No intake or output data in the 24 hours ending 04/26/23 2334  Labs/Imaging Results for orders placed or performed during the hospital encounter of 04/26/23 (from the past 48 hour(s))  Lipase, blood     Status: None   Collection Time: 04/26/23  9:51 AM  Result Value  Ref Range   Lipase 36 11 - 51 U/L    Comment: Performed at [facility], [address] [place], [place] [address]  Comprehensive metabolic panel     Status: Abnormal   Collection Time: 04/26/23  9:51 AM  Result Value Ref Range   Sodium 136 135 - 145 mmol/L   Potassium 3.4 (L) 3.5 - 5.1 mmol/L   Chloride 104 98 - 111 mmol/L   CO2 24 22 - 32 mmol/L   Glucose, Bld 109 (H) 70 - 99 mg/dL    Comment: Glucose reference range applies only to samples taken after fasting for at least 8 hours.   BUN 12 8 - 23 mg/dL   Creatinine, Ser 6.04 0.44 - 1.00 mg/dL   Calcium 8.8 (L) 8.9 - 10.3 mg/dL   Total Protein 7.0 6.5 - 8.1 g/dL   Albumin 3.6 3.5 - 5.0 g/dL   AST 16 15 - 41 U/L   ALT 12 0 - 44 U/L  Alkaline Phosphatase 48 38 - 126 U/L   Total Bilirubin 1.2 (H) <1.2 mg/dL   GFR, Estimated >40 >98 mL/min    Comment: (NOTE) Calculated using the CKD-EPI Creatinine Equation (2021)    Anion gap 8 5 - 15    Comment: Performed at [facility], [address] [place], [place] [address]  CBC     Status: Abnormal   Collection Time: 04/26/23  9:51 AM  Result Value Ref Range   WBC 5.6 4.0 - 10.5 K/uL   RBC 3.78 (L) 3.87 - 5.11 MIL/uL   Hemoglobin 12.1 12.0 - 15.0 g/dL   HCT 78.2 95.6 - 21.3 %   MCV 98.9 80.0 - 100.0 fL   MCH 32.0 26.0 - 34.0 pg   MCHC 32.4 30.0 - 36.0 g/dL   RDW 08.6 57.8 - 46.9 %   Platelets 244 150 - 400 K/uL   nRBC 0.0 0.0 - 0.2 %    Comment: Performed at [facility], [address] [place], [place] [address]  Urinalysis, Routine w reflex microscopic -Urine, Clean Catch     Status: Abnormal   Collection Time: 04/26/23 12:16 PM  Result Value Ref Range   Color, Urine STRAW (A) YELLOW   APPearance CLEAR (A) CLEAR   Specific Gravity, Urine 1.006 1.005 - 1.030   pH 9.0 (H) 5.0 - 8.0   Glucose, UA NEGATIVE NEGATIVE mg/dL   Hgb urine dipstick NEGATIVE NEGATIVE   Bilirubin Urine NEGATIVE NEGATIVE   Ketones, ur 5 (A) NEGATIVE mg/dL   Protein, ur  NEGATIVE NEGATIVE mg/dL   Nitrite NEGATIVE NEGATIVE   Leukocytes,Ua TRACE (A) NEGATIVE   RBC / HPF 0-5 0 - 5 RBC/hpf   WBC, UA 6-10 0 - 5 WBC/hpf   Bacteria, UA NONE SEEN NONE SEEN   Squamous Epithelial / HPF 11-20 0 - 5 /HPF   Mucus PRESENT     Comment: Performed at [facility], [address] [place], [place] [address]  C Difficile Quick Screen w PCR reflex     Status: Abnormal   Collection Time: 04/26/23 12:16 PM   Specimen: Urine, In & Out Cath; Stool  Result Value Ref Range   C Diff antigen POSITIVE (A) NEGATIVE   C Diff toxin POSITIVE (A) NEGATIVE   C Diff interpretation Toxin producing C. difficile detected.     Comment: CRITICAL RESULT CALLED TO, READ BACK BY AND VERIFIED WITH: MELISSA RUNGE AT 1353 04/26/23.PMF Performed at [facility], [address] [place], [place] [address]    DG Chest [place] 1 View  Result Date: 04/26/2023 CLINICAL DATA:  Altered mental status. EXAM: PORTABLE CHEST 1 VIEW COMPARISON:  January 08, 2017 FINDINGS: The cardiac silhouette is borderline in size. There is tortuosity of the descending thoracic aorta. Mild, diffuse, chronic appearing increased lung markings are seen. There is no evidence of an acute infiltrate, pleural effusion or pneumothorax. Multilevel degenerative changes seen throughout the thoracic spine with postoperative changes seen within the visualized portion of the mid to upper lumbar spine. IMPRESSION: Chronic appearing increased lung markings without evidence of acute or active cardiopulmonary disease. Electronically Signed   By: Aram Candela M.D.   On: 04/26/2023 19:47    Pending Labs Unresulted Labs (From admission, onward)     Start     Ordered   04/27/23 0500  Basic metabolic panel  Tomorrow morning,   R        04/26/23 1424   04/27/23 0500  CBC  Tomorrow  morning,   R        04/26/23 1424   04/27/23 0500  Magnesium  Tomorrow morning,   R        04/26/23 1433            Vitals/Pain Today's  Vitals   04/26/23 1622 04/26/23 1700 04/26/23 1844 04/26/23 2156  BP: (!) 160/77 (!) 145/81 (!) 156/70 (!) 144/64  Pulse: 67 (!) 102  74  Resp: 18 18  20   Temp:      TempSrc:      SpO2: 93% 94%  93%  Weight:      Height:      PainSc: 0-No pain       Isolation Precautions Enteric precautions (UV disinfection)  Medications Medications  acetaminophen (TYLENOL) tablet 650 mg (has no administration in time range)    Or  acetaminophen (TYLENOL) suppository 650 mg (has no administration in time range)  ondansetron (ZOFRAN) tablet 4 mg (has no administration in time range)    Or  ondansetron (ZOFRAN) injection 4 mg (has no administration in time range)  heparin injection 5,000 Units (5,000 Units Subcutaneous Given 04/26/23 2312)  fidaxomicin (DIFICID) tablet 200 mg (200 mg Oral Given 04/26/23 2324)  lactated ringers infusion ( Intravenous New Bag/Given 04/26/23 1455)  hydrALAZINE (APRESOLINE) injection 5 mg (has no administration in time range)  amLODipine (NORVASC) tablet 10 mg (10 mg Oral Given 04/26/23 1844)  pravastatin (PRAVACHOL) tablet 40 mg (40 mg Oral Given 04/26/23 2313)  escitalopram (LEXAPRO) tablet 10 mg (10 mg Oral Given 04/26/23 2313)  donepezil (ARICEPT) tablet 5 mg (5 mg Oral Given 04/26/23 2313)  cyanocobalamin (VITAMIN B12) tablet 2,000 mcg (has no administration in time range)  calcium-vitamin D (OSCAL WITH D) 500-5 MG-MCG per tablet 1 tablet (has no administration in time range)  cholecalciferol (VITAMIN D3) 25 MCG (1000 UNIT) tablet 1,000 Units (has no administration in time range)  latanoprost (XALATAN) 0.005 % ophthalmic solution 1 drop (1 drop Both Eyes Given 04/26/23 2325)  raloxifene (EVISTA) tablet 60 mg (60 mg Oral Given 04/26/23 2325)  LORazepam (ATIVAN) injection 1 mg (has no administration in time range)  sodium chloride 0.9 % bolus 1,000 mL (0 mLs Intravenous Stopped 04/26/23 1314)  haloperidol lactate (HALDOL) injection 2.5 mg (2.5 mg Intravenous Given  04/26/23 1117)  LORazepam (ATIVAN) injection 1 mg (1 mg Intravenous Given 04/26/23 1141)  haloperidol lactate (HALDOL) injection 5 mg (5 mg Intravenous Given 04/26/23 1855)    Mobility non-ambulatory     Focused Assessments GI... bowel sounds x 4, hyperactive. Multiple diarrheal stools in dept   R Recommendations: See Admitting Provider Note  Report given to:   Additional Notes: confused at baseline

## 2023-04-26 NOTE — Assessment & Plan Note (Signed)
Mild LR 125 mL/h, 1 day ordered Check BMP and magnesium level in a.m.

## 2023-04-26 NOTE — ED Notes (Signed)
Brother stated that no family would be able to spend the night with pt. Pt has been trying to climb out of bed and pull leads off all day. Family has been with pt keeping her in the bed. Charge RN made aware.

## 2023-04-26 NOTE — ED Notes (Signed)
Pt here about a week ago for UTI. Given antibiotics. Since then pt has had loose stools with severe diarrhea  occurring this morning. Family gave pt 2 imodium.

## 2023-04-26 NOTE — H&P (Addendum)
History and Physical   [name] UUV:[id number] DOB: 07/30/1932 DOA: 04/26/2023  PCP: Lauro Regulus, MD Outpatient Specialists: Dr. Apolinar Junes, urology Patient coming from: Home via EMS  I have personally briefly reviewed patient's old medical records in [facility] EMR.  Chief Concern: Diarrhea  HPI: Mr. [name] is an 89 year old female with history of advanced dementia, hypertension, hyperlipidemia, sinus bradycardia, recent Klebsiella UTI, who presents emergency department for concerns of ongoing diarrhea.  Per nursing documentation, patient was given 2 Imodiums at home.  Vitals in the ED showed temperature of 98.7, respiration rate 16, heart rate 71, blood pressure 154/73, SpO2 of 96% on room air.  Serum sodium is 136, potassium 3.4, chloride 104, bicarb 24, BUN of 10, serum creatinine 0.91, EGFR greater than 60, nonfasting blood glucose 109, WBC 5.6, hemoglobin 12.1, platelets of 244.  UA was positive for trace leukocytes.  C. difficile screening was positive for C. difficile antigen and C. difficile toxin.  ED treatment: Haldol 2.1 mg IV one-time dose, Ativan 1 mg IV, sodium chloride 1 L bolus. ---------------------------------- At bedside, patient able to tell me her name. She does not know her age, current calendar year, current location.   Son at bedside states that patient has been having increasing agitation and increased confusion over the last 2 to 3 weeks.  Her diarrhea to started today.  Family has been trying to get patient to [facility] memory care center.  Family denies fever, vomiting at home.  Social history: She lives on her own. Family is trying to get her into [facility]. Son states she does not smoke, use etoh, or recreational drug use.   ROS: Able to complete as patient has advanced dementia  ED Course: Discussed with EDP, patient requiring hospitalization for chief concerns of C. difficile colitis.  Assessment/Plan  Principal  Problem:   Encephalopathy acute Active Problems:   Carotid arterial disease (HCC)   Hyperlipidemia   Carotid artery disease (HCC)   Anemia, iron deficiency   Sinus bradycardia   Advanced dementia (HCC)   C. difficile diarrhea   Essential hypertension   Hypokalemia   Diarrhea   Pyuria   Assessment and Plan:  * Encephalopathy acute In setting of patient with advanced dementia at baseline Suspect secondary to C. difficile colitis However given patient has advanced dementia, pneumonia cannot be excluded Portable chest x-ray has been ordered on admission, pending completion and radiology read at the time of this dictation Treat per above  Diarrhea In setting of infectious diarrhea, no antidiarrheal medication will be given  Hypokalemia Mild LR 125 mL/h, 1 day ordered Check BMP and magnesium level in a.m.  Essential hypertension Home amlodipine 10 mg daily resumed on admission Hydralazine 5 mg IV every 6 hours as needed for SBP greater 170, 5 days ordered  C. difficile diarrhea Fidaxomicin 200 mg PO BID, 10-day course initiated on admission Continue enteric precautions  Advanced dementia (HCC) Home donepezil 5 mg nightly resumed  Hyperlipidemia Home pravastatin 40 mg nightly resumed on admission  # Agitation and confusion in setting of advanced dementia-patient is a danger to herself with increased fall risk Mittens have been ordered One-to-one sitter at bedside ordered Ativan 0.5 mg every 6 hours as needed for anxiety has been changed to Ativan 1 mg every 6 hours as needed for anxiety One-time dose of Haldol 5 mg IV ordered  Chart reviewed.   DVT prophylaxis: Heparin 5000 units subcutaneous every 8 hours Code Status: Full code Diet: Heart healthy diet Family  Communication: Derl Barrow, son Disposition Plan: Pending clinical course Consults called: None at this time Admission status: Telemetry medical, inpatient  Past Medical History:  Diagnosis Date    Anemia    Anemia    Bladder cancer (HCC)    Cancer (HCC) 2016   bladder tumor   Carotid arterial disease (HCC) 01/09/2014   Overview:  Minimal on screening    Chronic low back pain    Colon polyp    DDD (degenerative disc disease), cervical    DDD (degenerative disc disease), lumbar    Elevated lipids    Erosive esophagitis    Foot pain    GERD (gastroesophageal reflux disease)    Glaucoma    H/O concussion 04/2012   Due to fall at [facility]   Heart murmur    History of bone density study    History of chickenpox    HOH (hard of hearing)    Bilateral  Hearing Aids   Hypertension    Lower back pain    Lower extremity edema    Multinodular thyroid    Multiple gastric ulcers    Osteoporosis    Personal history of chemotherapy    bladder   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HCC)    Thyroid disease    Past Surgical History:  Procedure Laterality Date   ABDOMINAL HYSTERECTOMY  1973   APPLICATION OF WOUND VAC Right 01/17/2017   Procedure: APPLICATION OF WOUND VAC;  Surgeon: Kennedy Bucker, MD;  Location: [facility] ORS;  Service: Orthopedics;  Laterality: Right;   BACK SURGERY     03/1975-ruptured disk, 11/1983-ruptured disk, 11/1984-ruptured disk, 10/1996-ruptured disk, 08/1997-spinal fusion, 02/10/07-spinal fusion [facility]   BACK SURGERY     Spinal Fusion X 2   CARPAL TUNNEL RELEASE Right    CATARACT EXTRACTION Bilateral    CYSTOSCOPY W/ RETROGRADES Bilateral 09/29/2014   Procedure: CYSTOSCOPY WITH RETROGRADE PYELOGRAM;  Surgeon: Vanna Scotland, MD;  Location: [facility] ORS;  Service: Urology;  Laterality: Bilateral;   CYSTOSCOPY WITH BIOPSY N/A 11/02/2014   Procedure: CYSTOSCOPY WITH BIOPSY/WITH MITOMYCIN;  Surgeon: Vanna Scotland, MD;  Location: [facility] ORS;  Service: Urology;  Laterality: N/A;   ESOPHAGOGASTRODUODENOSCOPY (EGD) WITH PROPOFOL N/A 05/10/2015   Procedure: ESOPHAGOGASTRODUODENOSCOPY (EGD) WITH PROPOFOL;  Surgeon: Christena Deem, MD;  Location[facility] ENDOSCOPY;  Service: Endoscopy;   Laterality: N/A;   ESOPHAGOGASTRODUODENOSCOPY (EGD) WITH PROPOFOL N/A 01/08/2018   Procedure: ESOPHAGOGASTRODUODENOSCOPY (EGD) WITH PROPOFOL;  Surgeon: Toledo, Boykin Nearing, MD;  Location: [facility] ENDOSCOPY;  Service: Gastroenterology;  Laterality: N/A;   EYE SURGERY Bilateral    Cataract Extraction with IOL   FOOT SURGERY Right    INCISION AND DRAINAGE ABSCESS Right 11/20/2016   Procedure: INCISION AND DRAINAGE ABSCESS GLUTEAL;  Surgeon: Kieth Brightly, MD;  Location: [facility] ORS;  Service: General;  Laterality: Right;   INCISION AND DRAINAGE ABSCESS Right 01/17/2017   Procedure: INCISION AND DRAINAGE GLUTEAL ABSCESS;  Surgeon: Kennedy Bucker, MD;  Location: [facility] ORS;  Service: Orthopedics;  Laterality: Right;   IRRIGATION AND DEBRIDEMENT BUTTOCKS Right 10/21/2015   Procedure: DRAINAGE GLUTEAL ABSCESS;  Surgeon: Kieth Brightly, MD;  Location: [facility] ORS;  Service: General;  Laterality: Right;   KNEE ARTHROPLASTY Left 01/10/2018   Procedure: COMPUTER ASSISTED TOTAL KNEE ARTHROPLASTY;  Surgeon: Donato Heinz, MD;  Location: [facility] ORS;  Service: Orthopedics;  Laterality: Left;   KNEE ARTHROPLASTY Right 06/16/2018   Procedure: COMPUTER ASSISTED TOTAL KNEE ARTHROPLASTY;  Surgeon: Donato Heinz, MD;  Location: [facility] ORS;  Service: Orthopedics;  Laterality: Right;   KNEE ARTHROSCOPY Right    x2   MANDIBLE SURGERY Bilateral    x2   PICC LINE INSERTION Right 01/08/2017   SHOULDER ARTHROSCOPY WITH ROTATOR CUFF REPAIR AND SUBACROMIAL DECOMPRESSION Right 05/25/2015   Procedure: SHOULDER ARTHROSCOPY WITH ROTATOR CUFF REPAIR AND SUBACROMIAL DECOMPRESSION, release long head biceps tendon;  Surgeon: Erin Sons, MD;  Location: [facility] ORS;  Service: Orthopedics;  Laterality: Right;   TRANSURETHRAL RESECTION OF BLADDER TUMOR N/A 09/29/2014   Procedure: TRANSURETHRAL RESECTION OF BLADDER TUMOR (TURBT);  Surgeon: Vanna Scotland, MD;  Location: [facility] ORS;  Service: Urology;  Laterality: N/A;   WRIST FRACTURE  SURGERY Left    Social History:  reports that she has never smoked. She has never used smokeless tobacco. She reports that she does not drink alcohol and does not use drugs.  Allergies  Allergen Reactions   Tramadol Nausea And Vomiting   Family History  Problem Relation Age of Onset   Heart attack Father    Emphysema Father    Heart disease Mother    Hypertension Mother    Diabetes type II Sister    Kidney disease Neg Hx    Bladder Cancer Neg Hx    Breast cancer Neg Hx    Family history: Family history reviewed and not pertinent.  Prior to Admission medications   Medication Sig Start Date End Date Taking? Authorizing Provider  amLODipine (NORVASC) 10 MG tablet Take 1 tablet (10 mg total) by mouth daily. 04/13/23   Marrion Coy, MD  calcium-vitamin D (OSCAL WITH D) 500-200 MG-UNIT tablet Take 1 tablet by mouth daily.    [provider]  Cholecalciferol (VITAMIN D3) 25 MCG (1000 UT) CAPS Take 1,000 Units by mouth daily.    [provider]  donepezil (ARICEPT) 5 MG tablet Take 5 mg by mouth at bedtime. 03/12/21   [provider]  escitalopram (LEXAPRO) 10 MG tablet Take 1 tablet by mouth daily. 04/09/23 04/08/24  [provider]  latanoprost (XALATAN) 0.005 % ophthalmic solution Place 1 drop into both eyes at bedtime.    [provider]  pravastatin (PRAVACHOL) 40 MG tablet Take 40 mg by mouth at bedtime.     [provider]  raloxifene (EVISTA) 60 MG tablet Take 60 mg by mouth daily.    [provider]  vitamin B-12 (CYANOCOBALAMIN) 1000 MCG tablet Take 2,000 mcg by mouth daily.    [provider]   Physical Exam: Vitals:   04/26/23 1450 04/26/23 1622 04/26/23 1700 04/26/23 1844  BP:  (!) 160/77 (!) 145/81 (!) 156/70  Pulse:  67 (!) 102   Resp:  18 18   Temp: 98.9 F (37.2 C)     TempSrc: Oral     SpO2:  93% 94%   Weight:      Height:       Constitutional: appears frail, chronically ill,  confused Eyes: PERRL, lids and conjunctivae normal ENMT: Mucous membranes are dry. Posterior pharynx clear of any exudate or lesions. Age-appropriate dentition. Hearing appropriate Neck: normal, supple, no masses, no thyromegaly Respiratory: clear to auscultation bilaterally, no wheezing, no crackles. Normal respiratory effort. No accessory muscle use.  Cardiovascular: Regular rate and rhythm, no murmurs / rubs / gallops. No extremity edema. 2+ pedal pulses. No carotid bruits.  Abdomen: Obese abdomen, no tenderness, no masses palpated, no hepatosplenomegaly. Bowel sounds positive.  Musculoskeletal: no clubbing / cyanosis. No joint deformity upper and lower extremities. Good ROM, no contractures, no atrophy. Normal muscle tone.  Skin: no rashes, lesions, ulcers. No induration Neurologic: Sensation intact.  Generalized weakness of all extremities. Psychiatric: Lacks judgment and insight consistent with dementia. Alert and oriented x first name only.  Agitated mood.   EKG: Not indicated at this time  Chest x-ray on Admission: Ordered and pending completion  No results found.  Labs on Admission: I have personally reviewed following labs  CBC: Recent Labs  Lab 04/26/23 0951  WBC 5.6  HGB 12.1  HCT 37.4  MCV 98.9  PLT 244   Basic Metabolic Panel: Recent Labs  Lab 04/26/23 0951  NA 136  K 3.4*  CL 104  CO2 24  GLUCOSE 109*  BUN 12  CREATININE 0.91  CALCIUM 8.8*   GFR: Estimated Creatinine Clearance: 42.3 mL/min (by C-G formula based on SCr of 0.91 mg/dL).  Liver Function Tests: Recent Labs  Lab 04/26/23 0951  AST 16  ALT 12  ALKPHOS 48  BILITOT 1.2*  PROT 7.0  ALBUMIN 3.6   Recent Labs  Lab 04/26/23 0951  LIPASE 36   Urine analysis:    Component Value Date/Time   COLORURINE STRAW (A) 04/26/2023 1216   APPEARANCEUR CLEAR (A) 04/26/2023 1216   APPEARANCEUR Clear 05/03/2021 1025   LABSPEC 1.006 04/26/2023 1216   PHURINE 9.0 (H) 04/26/2023 1216   GLUCOSEU  NEGATIVE 04/26/2023 1216   HGBUR NEGATIVE 04/26/2023 1216   BILIRUBINUR NEGATIVE 04/26/2023 1216   BILIRUBINUR Negative 05/03/2021 1025   KETONESUR 5 (A) 04/26/2023 1216   PROTEINUR NEGATIVE 04/26/2023 1216   NITRITE NEGATIVE 04/26/2023 1216   LEUKOCYTESUR TRACE (A) 04/26/2023 1216   This document was prepared using Dragon Voice Recognition software and may include unintentional dictation errors.  Dr. Sedalia Muta [facility]  If 7PM-7AM, please contact overnight-coverage provider If 7AM-7PM, please contact day attending provider [email or web address]  04/26/2023, 6:55 PM

## 2023-04-26 NOTE — Assessment & Plan Note (Addendum)
Fidaxomicin 200 mg PO BID, 10-day course initiated on admission Continue enteric precautions

## 2023-04-26 NOTE — Assessment & Plan Note (Addendum)
Home pravastatin 40 mg nightly resumed on admission

## 2023-04-26 NOTE — ED Notes (Signed)
PT anxious, pulling at IV, won't stay in the bed. Son stated he'd like pt to have something to relax her if possible. Md made aware.

## 2023-04-26 NOTE — Assessment & Plan Note (Addendum)
In setting of patient with advanced dementia at baseline Suspect secondary to C. difficile colitis However given patient has advanced dementia, pneumonia cannot be excluded Portable chest x-ray has been ordered on admission, pending completion and radiology read at the time of this dictation Treat per above

## 2023-04-26 NOTE — Hospital Course (Signed)
Mr. [name] is an 89 year old female with history of advanced dementia, hypertension, hyperlipidemia, sinus bradycardia, recent Klebsiella UTI, who presents emergency department for concerns of ongoing diarrhea. Patient also had some dehydration, was given fluids.  Stool study positive for C. difficile toxin, patient is treated with Dificid for 10 days. 12/2.  Diarrhea improving, changed to oral vancomycin. 12/3.  Patient is seen by palliative care, transition to comfort care.

## 2023-04-26 NOTE — ED Notes (Signed)
Pt very agitated. Pt keeps taking safety mitts off and trying to climb out of bed. MD made aware. See new orders.

## 2023-04-26 NOTE — Assessment & Plan Note (Addendum)
Home amlodipine 10 mg daily resumed on admission Hydralazine 5 mg IV every 6 hours as needed for SBP greater 170, 5 days ordered

## 2023-04-26 NOTE — Progress Notes (Signed)
PT Cancellation Note  Patient Details Name: [name] MRN: [id number] DOB: [birth year]   Cancelled Treatment:    Reason Eval/Treat Not Completed: Other (comment): MD requested PT eval to be held until 11/30.  Will attempt to see pt at a future date/time as medically appropriate.    Ovidio Hanger PT, DPT 04/26/23, 4:07 PM

## 2023-04-26 NOTE — ED Notes (Signed)
Pt's brief and gown changed. Pt resting comfortably with call bell within reach and family at bedside.

## 2023-04-26 NOTE — Assessment & Plan Note (Addendum)
Home donepezil 5 mg nightly resumed

## 2023-04-26 NOTE — ED Notes (Signed)
Pt a little more relaxed. Family at bedside.

## 2023-04-26 NOTE — ED Triage Notes (Signed)
Pt arrives via ems from home, family called 911 due to ot having ongoing diarrhea that started approx 45 min ago, pt has a hx of dementia and family stated to ems attempts started for placement   Ems 156/71 72p 100% ra  99.2 A temp 141 cbg

## 2023-04-26 NOTE — ED Notes (Signed)
Sons at bedside. Son stated they gave pt 2 imodium around 0830 today.

## 2023-04-26 NOTE — Assessment & Plan Note (Signed)
In setting of infectious diarrhea, no antidiarrheal medication will be given

## 2023-04-26 NOTE — ED Notes (Signed)
Family called out saying pt was getting out of bed. This RN went in to find pt's legs hanging off the bed and pt fighting family. This RN got pt back up in the bed and put safety mits on pt.

## 2023-04-26 NOTE — ED Notes (Signed)
Pt still anxious, pulling at wires and IV. MD made aware. New medication orders were placed.

## 2023-04-26 NOTE — ED Notes (Signed)
Attempted to start IV 2 times. Will have another RN attempt.

## 2023-04-27 DIAGNOSIS — F02C0 Dementia in other diseases classified elsewhere, severe, without behavioral disturbance, psychotic disturbance, mood disturbance, and anxiety: Secondary | ICD-10-CM

## 2023-04-27 DIAGNOSIS — G301 Alzheimer's disease with late onset: Secondary | ICD-10-CM

## 2023-04-27 DIAGNOSIS — A0472 Enterocolitis due to Clostridium difficile, not specified as recurrent: Secondary | ICD-10-CM | POA: Diagnosis not present

## 2023-04-27 DIAGNOSIS — G9340 Encephalopathy, unspecified: Secondary | ICD-10-CM | POA: Diagnosis not present

## 2023-04-27 LAB — CBC
HCT: 38.8 % (ref 36.0–46.0)
Hemoglobin: 12.6 g/dL (ref 12.0–15.0)
MCH: 31.5 pg (ref 26.0–34.0)
MCHC: 32.5 g/dL (ref 30.0–36.0)
MCV: 97 fL (ref 80.0–100.0)
Platelets: 240 10*3/uL (ref 150–400)
RBC: 4 MIL/uL (ref 3.87–5.11)
RDW: 12.8 % (ref 11.5–15.5)
WBC: 5.1 10*3/uL (ref 4.0–10.5)
nRBC: 0 % (ref 0.0–0.2)

## 2023-04-27 LAB — BASIC METABOLIC PANEL
Anion gap: 10 (ref 5–15)
BUN: 9 mg/dL (ref 8–23)
CO2: 22 mmol/L (ref 22–32)
Calcium: 8.4 mg/dL — ABNORMAL LOW (ref 8.9–10.3)
Chloride: 107 mmol/L (ref 98–111)
Creatinine, Ser: 0.88 mg/dL (ref 0.44–1.00)
GFR, Estimated: >60 mL/min (ref >60)
Glucose, Bld: 105 mg/dL — ABNORMAL HIGH (ref 70–99)
Potassium: 3 mmol/L — ABNORMAL LOW (ref 3.5–5.1)
Sodium: 139 mmol/L (ref 135–145)

## 2023-04-27 LAB — MAGNESIUM: Magnesium: 2.1 mg/dL (ref 1.7–2.4)

## 2023-04-27 MED ORDER — POTASSIUM CHLORIDE 10 MEQ/100ML IV SOLN
10.0000 meq | Freq: Once | INTRAVENOUS | Status: AC
  Administered 2023-04-27: 10 meq via INTRAVENOUS
  Filled 2023-04-27: qty 100

## 2023-04-27 MED ORDER — POTASSIUM CHLORIDE CRYS ER 20 MEQ PO TBCR
40.0000 meq | EXTENDED_RELEASE_TABLET | Freq: Once | ORAL | Status: AC
  Administered 2023-04-27: 40 meq via ORAL
  Filled 2023-04-27: qty 2

## 2023-04-27 MED ORDER — POTASSIUM CHLORIDE CRYS ER 20 MEQ PO TBCR
40.0000 meq | EXTENDED_RELEASE_TABLET | ORAL | Status: AC
  Administered 2023-04-27 (×2): 40 meq via ORAL
  Filled 2023-04-27 (×2): qty 2

## 2023-04-27 MED ORDER — HALOPERIDOL LACTATE 5 MG/ML IJ SOLN
1.0000 mg | Freq: Four times a day (QID) | INTRAMUSCULAR | Status: DC | PRN
  Administered 2023-04-27 – 2023-04-28 (×3): 1 mg via INTRAVENOUS
  Filled 2023-04-27 (×4): qty 1

## 2023-04-27 NOTE — Evaluation (Signed)
Physical Therapy Evaluation Patient Details Name: [name] MRN: [id number] DOB: 03/23/1933 Today's Date: 04/27/2023  History of Present Illness  Pt is 89 y/o admitted 04/26/23 for acute encephalopathy. Family has noted a change in cognitive status as an increase in confusion and agitation has been noticed the past few weeks. PmHx includes: advanced dementia, HTN, hyperlipidemia, klebsiella UTI, and sinus brachycardia.   Clinical Impression  Pt received in bed with son at bedside/mittens in place and agreed to PT session. Pt presented with decreased speech clarity and difficulty communicating thoughts, son assisted in providing history. Pt performed bed mobility SUP, STS with the use of RW (2wheels) CGA, and amb ~27ft CGA. Pt needed to use the bathroom during session, bedding/hygiene/gown change addressed as necessary. Session was completed in bed with RN present to take care of their duties at the end of session. Pt tolerated Tx fair and will continue to benefit from skilled PT sessions to improve activity tolerance and functional mobility to maximize safety/IND following D/C.        If plan is discharge home, recommend the following: A lot of help with bathing/dressing/bathroom;A little help with walking and/or transfers;Assistance with cooking/housework;Assist for transportation;Help with stairs or ramp for entrance;Supervision due to cognitive status   Can travel by private vehicle        Equipment Recommendations None recommended by PT  Recommendations for Other Services       Functional Status Assessment Patient has had a recent decline in their functional status and demonstrates the ability to make significant improvements in function in a reasonable and predictable amount of time.     Precautions / Restrictions Precautions Precautions: Fall Restrictions Weight Bearing Restrictions: No      Mobility  Bed Mobility Overal bed mobility: Needs Assistance Bed  Mobility: Supine to Sit, Sit to Supine     Supine to sit: Supervision Sit to supine: Supervision   General bed mobility comments: Pt performed bed mobility SUP and did not report any s/sx relative to dizziness.    Transfers Overall transfer level: Needs assistance Equipment used: Rolling walker (2 wheels) Transfers: Sit to/from Stand Sit to Stand: Contact guard assist           General transfer comment: Pt performed STS with the use of RW (2wheels) and did not report any s/sx relative to dizziness when standing.    Ambulation/Gait Ambulation/Gait assistance: Contact guard assist Gait Distance (Feet): 30 Feet Assistive device: Rolling walker (2 wheels) Gait Pattern/deviations: Step-through pattern Gait velocity: decreased     General Gait Details: Pt amb with the use of RW (2wheels) CGA within the room. VC necessary for RW management.  Stairs            Wheelchair Mobility     Tilt Bed    Modified Rankin (Stroke Patients Only)       Balance Overall balance assessment: Needs assistance Sitting-balance support: Feet supported Sitting balance-Leahy Scale: Good     Standing balance support: During functional activity, Bilateral upper extremity supported Standing balance-Leahy Scale: Fair                               Pertinent Vitals/Pain Pain Assessment Pain Assessment: No/denies pain    Home Living Family/patient expects to be discharged to:: Private residence Living Arrangements: Alone Available Help at Discharge: Family;Available 24 hours/day Type of Home: House Home Access: Stairs to enter Entrance Stairs-Rails: Right Entrance Stairs-Number of Steps: 3-4  Home Layout: One level Home Equipment: Agricultural consultant (2 wheels) Additional Comments: Pt's son present during session. Pt's son reported that following D/C if rehab is not necessary, pt will be moving into a memory care facility.    Prior Function Prior Level of Function :  Needs assist             Mobility Comments: Pt's son present during session. Pt's son reports that pt has been able to amb within her home without an AD, though has access to a RW (2wheels) from prior TKR. ADLs Comments: Pt's son present during session. Pt's son reports assisting pt with bathing, toileting, and dressing. Pt's son states that she is able to dress herself, however, her clothes must be laid out for her piece by piece.     Extremity/Trunk Assessment   Upper Extremity Assessment Upper Extremity Assessment: Overall WFL for tasks assessed    Lower Extremity Assessment Lower Extremity Assessment: Generalized weakness       Communication   Communication Communication: No apparent difficulties Cueing Techniques: Verbal cues  Cognition   Behavior During Therapy: WFL for tasks assessed/performed Overall Cognitive Status: Impaired/Different from baseline Area of Impairment: Orientation, Safety/judgement, Problem solving                 Orientation Level: Disoriented to, Place, Time, Situation       Safety/Judgement: Decreased awareness of safety, Decreased awareness of deficits   Problem Solving: Requires verbal cues General Comments: Pt pleasant and willing to participate in PT session. It was difficult to comprehend pt's speech as she presented with decreased clarity and dificulty with communicating her thoughts. Son assisted in providing history. VC necessary throughout session for RW management. Not noticed during today's session, however pt has been noted to be impulsive and agitated.        General Comments      Exercises     Assessment/Plan    PT Assessment Patient needs continued PT services  PT Problem List Decreased activity tolerance;Decreased mobility       PT Treatment Interventions DME instruction;Gait training;Therapeutic activities;Functional mobility training    PT Goals (Current goals can be found in the Care Plan section)  Acute  Rehab PT Goals Patient Stated Goal: No goals stated PT Goal Formulation: With patient Time For Goal Achievement: 05/11/23 Potential to Achieve Goals: Good    Frequency Min 1X/week     Co-evaluation               AM-PAC PT "6 Clicks" Mobility  Outcome Measure Help needed turning from your back to your side while in a flat bed without using bedrails?: None Help needed moving from lying on your back to sitting on the side of a flat bed without using bedrails?: A Little Help needed moving to and from a bed to a chair (including a wheelchair)?: A Little Help needed standing up from a chair using your arms (e.g., wheelchair or bedside chair)?: A Little Help needed to walk in hospital room?: A Little Help needed climbing 3-5 steps with a railing? : A Lot 6 Click Score: 18    End of Session   Activity Tolerance: Patient tolerated treatment well Patient left: in bed;with call bell/phone within reach;with nursing/sitter in room;with family/visitor present Nurse Communication: Mobility status PT Visit Diagnosis: Unsteadiness on feet (R26.81);Other abnormalities of gait and mobility (R26.89);Muscle weakness (generalized) (M62.81);Difficulty in walking, not elsewhere classified (R26.2)    Time: 2130-8657 PT Time Calculation (min) (ACUTE ONLY): 30 min  Charges:   PT Evaluation $PT Eval Low Complexity: 1 Low PT Treatments $Gait Training: 8-22 mins PT General Charges $$ ACUTE PT VISIT: 1 Visit         Renika Shiflet Sau[place]ignon Howard SPT, LAT, ATC  Roston Grunewald Sauvignon-Howard 04/27/2023, 1:22 PM

## 2023-04-27 NOTE — Progress Notes (Signed)
  Progress Note   Patient: [name] JSE:[id number] DOB: 19-Mar-1933 DOA: 04/26/2023     1 DOS: the patient was seen and examined on 04/27/2023   Brief hospital course: Mr. Brittnie Benway is an 89 year old female with history of advanced dementia, hypertension, hyperlipidemia, sinus bradycardia, recent Klebsiella UTI, who presents emergency department for concerns of ongoing diarrhea. Patient also had some dehydration, was given fluids.  Stool study positive for C. difficile toxin, patient is treated with Dificid for 10 days.   Principal Problem:   Encephalopathy acute Active Problems:   Carotid arterial disease (HCC)   Hyperlipidemia   Carotid artery disease (HCC)   Anemia, iron deficiency   Sinus bradycardia   Advanced dementia (HCC)   C. difficile diarrhea   Essential hypertension   Hypokalemia   Diarrhea   Pyuria   Assessment and Plan: C. difficile colitis. Hypokalemia secondary to diarrhea. Patient developed C. difficile after taking antibiotics treating UTI. Still has significant diarrhea, continue Dificid. Patient also received fluids for some dehydration, appear to be better.  Potassium dropped down again to 3.0, will give oral and IV potassium. Continue to monitor.  Acute metabolic encephalopathy. Advanced dementia. Patient had a worsening mental status time of admission, condition appears to be better today.  Continue watch for delirium.  Essential hypertension Resume home medicines.  Hyperlipidemia Resume home medicine       Subjective:  Patient is still confused, no agitation.  Physical Exam: Vitals:   04/26/23 2156 04/26/23 2333 04/27/23 0000 04/27/23 0122  BP: (!) 144/64 (!) 140/73 130/62 (!) 142/65  Pulse: 74 71  67  Resp: 20 (!) 24 18 18   Temp:  98.3 F (36.8 C)  98 F (36.7 C)  TempSrc:  Axillary  Axillary  SpO2: 93% 91%  100%  Weight:    70.2 kg  Height:       General exam: Appears calm and comfortable  Respiratory system:  Clear to auscultation. Respiratory effort normal. Cardiovascular system: S1 & S2 heard, RRR. No JVD, murmurs, rubs, gallops or clicks. No pedal edema. Gastrointestinal system: Abdomen is nondistended, soft and nontender. No organomegaly or masses felt. Normal bowel sounds heard. Central nervous system: Alert and oriented x1. No focal neurological deficits. Extremities: Symmetric 5 x 5 power. Skin: No rashes, lesions or ulcers Psychiatry: Flat affect.   Data Reviewed:  Review lab results.  Family Communication: None  Disposition: Status is: Inpatient Remains inpatient appropriate because: Severity of disease.  IV treatment.     Time spent: 35 minutes  Author: Marrion Coy, MD 04/27/2023 10:56 AM  For on call review [email or web address].

## 2023-04-27 NOTE — Plan of Care (Signed)

## 2023-04-27 NOTE — Evaluation (Signed)
Occupational Therapy Evaluation Patient Details Name: [name] MRN: [id number] DOB: [birth year] Today's Date: 04/27/2023   History of Present Illness Pt is 89 y/o admitted 04/26/23 for acute encephalopathy. Family has noted a change in cognitive status as an increase in confusion and agitation has been noticed the past few weeks. PmHx includes: advanced dementia, HTN, hyperlipidemia, klebsiella UTI, and sinus brachycardia.   Clinical Impression   Pt was seen for OT evaluation this date. Prior to hospital admission, pt's son reports needing A for ADL's and does not use any AD at baseline. Pt lives alone but family is able to provide 24/7 care. Pt presents to acute OT demonstrating impaired ADL performance and functional mobility 2/2 (See OT problem list for additional functional deficits). Upon arrival to room pt supine in bed, agreeable to tx. Son present during session and LPN at the beginning of session. Pt completed all bed mobility with Supervision. Pt noted to be a little impulsive and agitated during session. Pt ambulated to the bathroom and completed a toilet t/f with Sup-CGA+RW. Pt required cueing for sequencing and safety. Noted pt perservation and required cues to redirect to task. Pt completed hygiene and clothing managment with Sup-CGA +RW. Pt completed oral care standing at the sink with Sup-CGA+RW and returned to bedside. Pt left supine in bed with call bell within reach and all need met. Pt would benefit from skilled OT services to address noted impairments and functional limitations (see below for any additional details) in order to maximize safety and independence while minimizing falls risk and caregiver burden. Anticipate the need for follow up OT services upon acute hospital DC.           If plan is discharge home, recommend the following: A lot of help with bathing/dressing/bathroom;Supervision due to cognitive status;Assist for transportation;Assistance with  cooking/housework    Functional Status Assessment  Patient has had a recent decline in their functional status and demonstrates the ability to make significant improvements in function in a reasonable and predictable amount of time.  Equipment Recommendations  Other (comment) (Defer to next venue of care.)    Recommendations for Other Services       Precautions / Restrictions Precautions Precautions: Fall Restrictions Weight Bearing Restrictions: No      Mobility Bed Mobility Overal bed mobility: Needs Assistance Bed Mobility: Supine to Sit, Sit to Supine     Supine to sit: Supervision Sit to supine: Supervision        Transfers Overall transfer level: Needs assistance Equipment used: Rolling walker (2 wheels) Transfers: Sit to/from Stand Sit to Stand: Contact guard assist                  Balance Overall balance assessment: Needs assistance Sitting-balance support: Feet supported Sitting balance-Leahy Scale: Good     Standing balance support: During functional activity, Bilateral upper extremity supported, Reliant on assistive device for balance Standing balance-Leahy Scale: Fair                             ADL either performed or assessed with clinical judgement   ADL Overall ADL's : Needs assistance/impaired                                     Functional mobility during ADLs: Supervision/safety;Contact guard assist;Rolling walker (2 wheels);Cueing for safety;Cueing for sequencing General ADL Comments: Pt  ambulated to the bathroom and completed a toilet t/f with Sup-CGA+RW. Pt required cueing for sequencing and safety. Pt noted perservation and required cues to redirect to task. Pt completed hygiene and clothing managment with Sup-CGA +RW. Pt completed oral care standing at the sink with Sup-CGA+RW and returned to bedside.     Vision         Perception         Praxis         Pertinent Vitals/Pain Pain  Assessment Pain Assessment: No/denies pain     Extremity/Trunk Assessment Upper Extremity Assessment Upper Extremity Assessment: Overall WFL for tasks assessed   Lower Extremity Assessment Lower Extremity Assessment: Generalized weakness       Communication Communication Communication: No apparent difficulties Cueing Techniques: Verbal cues   Cognition Arousal: Alert Behavior During Therapy: WFL for tasks assessed/performed Overall Cognitive Status: Impaired/Different from baseline                                 General Comments: Pt pleasant and willing to participate in OT session. It was difficult to comprehend pt's speech as she presented with decreased clarity and dificulty with communicating her thoughts. VC necessary throughout session for RW management. Noticed during today's session pt to be impulsive.     General Comments       Exercises     Shoulder Instructions      Home Living Family/patient expects to be discharged to:: Private residence Living Arrangements: Alone Available Help at Discharge: Family;Available 24 hours/day Type of Home: House Home Access: Stairs to enter [employer] of Steps: 3-4 Entrance Stairs-Rails: Right Home Layout: One level               Home Equipment: Agricultural consultant (2 wheels)   Additional Comments: Pt's son present during session. Pt's son reported that following D/C if rehab is not necessary, pt will be moving into a memory care facility.      Prior Functioning/Environment Prior Level of Function : Needs assist             Mobility Comments: Pt's son present during session. Pt's son reports that pt has been able to amb within her home without an AD, though has access to a RW (2wheels) from prior TKR. ADLs Comments: Pt's son present during session. Pt's son reports assisting pt with bathing, toileting, and dressing. Pt's son states that she is able to dress herself, however, her clothes must  be laid out for her piece by piece.        OT Problem List: Decreased strength;Decreased range of motion;Decreased activity tolerance;Impaired balance (sitting and/or standing);Decreased safety awareness      OT Treatment/Interventions: Self-care/ADL training;Therapeutic exercise;Therapeutic activities;Patient/family education    OT Goals(Current goals can be found in the care plan section) Acute Rehab OT Goals Patient Stated Goal: to get better OT Goal Formulation: With patient/family Time For Goal Achievement: 05/11/23 Potential to Achieve Goals: Good  OT Frequency: Min 1X/week    Co-evaluation              AM-PAC OT "6 Clicks" Daily Activity     Outcome Measure Help from another person eating meals?: None Help from another person taking care of personal grooming?: A Little Help from another person toileting, which includes using toliet, bedpan, or urinal?: A Little Help from another person bathing (including washing, rinsing, drying)?: A Little Help from another person to put on  and taking off regular upper body clothing?: A Little Help from another person to put on and taking off regular lower body clothing?: A Little 6 Click Score: 19   End of Session Equipment Utilized During Treatment: Rolling walker (2 wheels) Nurse Communication: Mobility status  Activity Tolerance: Patient tolerated treatment well Patient left: in bed;with bed alarm set;with call bell/phone within reach;with family/visitor present  OT Visit Diagnosis: Other (comment) (C. Diff Colitis)                Time: 1324-4010 OT Time Calculation (min): 26 min Charges:     Butch Penny, SOT

## 2023-04-27 NOTE — Plan of Care (Signed)
  Problem: Education: Goal: Knowledge of General Education information will improve Description: Including pain rating scale, medication(s)/side effects and non-pharmacologic comfort measures Outcome: Progressing   Problem: Activity: Goal: Risk for activity intolerance will decrease Outcome: Progressing   Problem: Pain Management: Goal: General experience of comfort will improve Outcome: Progressing   Problem: Safety: Goal: Ability to remain free from injury will improve Outcome: Progressing   Problem: Skin Integrity: Goal: Risk for impaired skin integrity will decrease Outcome: Progressing

## 2023-04-28 DIAGNOSIS — G301 Alzheimer's disease with late onset: Secondary | ICD-10-CM | POA: Diagnosis not present

## 2023-04-28 DIAGNOSIS — F02C0 Dementia in other diseases classified elsewhere, severe, without behavioral disturbance, psychotic disturbance, mood disturbance, and anxiety: Secondary | ICD-10-CM | POA: Diagnosis not present

## 2023-04-28 DIAGNOSIS — G9340 Encephalopathy, unspecified: Secondary | ICD-10-CM | POA: Diagnosis not present

## 2023-04-28 DIAGNOSIS — A0472 Enterocolitis due to Clostridium difficile, not specified as recurrent: Secondary | ICD-10-CM | POA: Diagnosis not present

## 2023-04-28 LAB — BASIC METABOLIC PANEL
Anion gap: 9 (ref 5–15)
BUN: 7 mg/dL — ABNORMAL LOW (ref 8–23)
CO2: 23 mmol/L (ref 22–32)
Calcium: 8.7 mg/dL — ABNORMAL LOW (ref 8.9–10.3)
Chloride: 108 mmol/L (ref 98–111)
Creatinine, Ser: 0.78 mg/dL (ref 0.44–1.00)
GFR, Estimated: >60 mL/min (ref >60)
Glucose, Bld: 105 mg/dL — ABNORMAL HIGH (ref 70–99)
Potassium: 3.6 mmol/L (ref 3.5–5.1)
Sodium: 140 mmol/L (ref 135–145)

## 2023-04-28 LAB — PHOSPHORUS: Phosphorus: 2.4 mg/dL — ABNORMAL LOW (ref 2.5–4.6)

## 2023-04-28 LAB — MAGNESIUM: Magnesium: 2 mg/dL (ref 1.7–2.4)

## 2023-04-28 NOTE — Progress Notes (Signed)
SLP Cancellation Note  Patient Details Name: [name] MRN: [id number] DOB: Aug 10, 1932   Cancelled treatment:       Reason Eval/Treat Not Completed: Fatigue/lethargy limiting ability to participate (Spoke with NT at bedside. Pt lethargic, confused, and with periods of agitation. Pt currently undergoing pt care. Will defer cognitive-linguistic evaluation at this time.)  Clyde Canterbury, M.S., CCC-SLP Speech-Language Pathologist [facility] [phone] Arnette Felts)  Woodroe Chen 04/28/2023, 10:06 AM

## 2023-04-28 NOTE — Plan of Care (Signed)
  Problem: Safety: Goal: Ability to remain free from injury will improve Outcome: Progressing   Problem: Skin Integrity: Goal: Risk for impaired skin integrity will decrease Outcome: Progressing   

## 2023-04-28 NOTE — TOC Initial Note (Signed)
Transition of Care [facility]) - Initial/Assessment Note    Patient Details  Name: [name] MRN: [id number] Date of Birth: 10-31-1932  Transition of Care [facility]) CM/SW Contact:    Liliana Cline, LCSW Phone Number: 04/28/2023, 3:52 PM  Clinical Narrative:                 Spoke with son Tinnie Gens. Gerome Apley states patient is from home, however they had arranged for her to move into [facility] tomorrow 12/2 prior to this hospitalization. Patient has a RW but does not typically use it. Tinnie Gens states he has informed [facility] of the hospitalization and they told him patient could move in a certain amount of days post her positive C diff test. Tinnie Gens states he does not feel patient would do well going to a SNF then going to Memory Care, he states he only wants to move her once if possible due to her Dementia. CSW explained that PT and OT have not recommended SNF, they recommend Home Health at the Memory Care ALF. Tinnie Gens is agreeable to [facility], declines having an agency preference. Tinnie Gens states he would like to use whichever agency [facility] prefers.   CSW called Misty Stanley at [facility]. Misty Stanley states they typically use [facility] if family does not have another preference. Referral has been made to [place] with [facility]. Misty Stanley states she will check with their Wellness Director regarding how long post C diff patient has to wait before moving into [place]. Misty Stanley states she will also check to see if they need to come assess patient at bedside prior to her moving in. Misty Stanley states they already have patient's TB test and everything needed for patient, other than an FL2 with her DC Medication list to be completed when she leaves the hospital.    Expected Discharge Plan: Memory Care Barriers to Discharge: Continued Medical Work up   Patient Goals and CMS Choice   CMS Medicare.gov Compare Post Acute Care list provided to:: Patient Represenative (must comment) Choice offered to / list  presented to : Adult Children      Expected Discharge Plan and Services       Living arrangements for the past 2 months: Single Family Home                           HH Arranged: PT, OT HH Agency: [facility] Date [facility] Agency Contacted: 04/28/23   Representative spoke with at [facility] Agency: Elnita Maxwell  Prior Living Arrangements/Services Living arrangements for the past 2 months: Single Family Home Lives with:: Self Patient language and need for interpreter reviewed:: Yes Do you feel safe going back to the place where you live?: Yes      Need for Family Participation in Patient Care: Yes (Comment) Care giver support system in place?: Yes (comment) Current home services: DME Criminal Activity/Legal Involvement Pertinent to Current Situation/Hospitalization: No - Comment as needed  Activities of Daily Living   ADL Screening (condition at time of admission) Independently performs ADLs?: No Does the patient have a NEW difficulty with bathing/dressing/toileting/self-feeding that is expected to last >3 days?: Yes (Initiates electronic notice to provider for possible OT consult) Does the patient have a NEW difficulty with getting in/out of bed, walking, or climbing stairs that is expected to last >3 days?: Yes (Initiates electronic notice to provider for possible PT consult) Does the patient have a NEW difficulty with communication that is expected to last >3 days?: Yes (  Initiates electronic notice to provider for possible SLP consult) Is the patient deaf or have difficulty hearing?: No Does the patient have difficulty seeing, even when wearing glasses/contacts?: No Does the patient have difficulty concentrating, remembering, or making decisions?: Yes  Permission Sought/Granted Permission sought to share information with : Facility Industrial/product designer granted to share information with : Yes, Verbal Permission Granted     Permission granted to share info w  AGENCY: [facility], HH agencies        Emotional Assessment       Orientation: : Fluctuating Orientation (Suspected and/or reported Sundowners) Alcohol / Substance Use: Not Applicable Psych Involvement: No (comment)  Admission diagnosis:  Encephalopathy acute [G93.40] Diarrhea in adult patient [R19.7] C. difficile colitis [A04.72] Dementia with agitation, unspecified dementia severity, unspecified dementia type (HCC) [F03.911] Patient Active Problem List   Diagnosis Date Noted   Encephalopathy acute 04/26/2023   C. difficile diarrhea 04/26/2023   Essential hypertension 04/26/2023   Hypokalemia 04/26/2023   Diarrhea 04/26/2023   Pyuria 04/26/2023   Acute metabolic encephalopathy 04/12/2023   UTI (urinary tract infection) 04/12/2023   Advanced dementia (HCC) 04/12/2023   Sinus bradycardia 04/11/2023   Bradycardia 04/11/2023   Varicose veins of leg with swelling, bilateral 04/08/2018   Status post total left knee replacement 01/10/2018   Monoclonal gammopathy of unknown significance (MGUS) 06/11/2016   Abscess of gluteal region 02/21/2016   Psoas abscess (HCC) 02/21/2016   Malignant neoplasm of urinary bladder (HCC) 08/23/2015   Status post total right knee replacement 08/23/2015   Impingement syndrome of shoulder 03/30/2015   Impingement syndrome of right shoulder 03/30/2015   Anemia, iron deficiency 02/02/2015   IDA (iron deficiency anemia) 01/26/2015   Malignant neoplasm of bladder neck (HCC) 12/30/2014   Colon polyp 12/17/2014   Inflammation of sacroiliac joint (HCC) 09/22/2014   Carotid arterial disease (HCC) 01/09/2014   Hyperlipidemia 01/09/2014   Accumulation of fluid in tissues 01/09/2014   Benign hypertension with CKD (chronic kidney disease) stage III (HCC) 01/09/2014   Arthritis, degenerative 01/09/2014   OP (osteoporosis) 01/09/2014   Carotid artery disease (HCC) 01/09/2014   Edema 01/09/2014   Cervical osteoarthritis 10/28/2013   DDD (degenerative disc  disease), cervical 10/28/2013   Acute blood loss anemia 05/16/2012   Fracture of parietal bone (HCC) 05/16/2012   Fall down stairs 05/16/2012   Hemorrhage into subarachnoid space of neuraxis (HCC) 05/16/2012   Subdural hematoma (HCC) 05/16/2012   Fracture of temporal bone (HCC) 05/16/2012   Traumatic subdural hemorrhage with loss of consciousness (HCC) 05/16/2012   PCP:  Lauro Regulus, MD Pharmacy:   [facility] [address] [place] - [address] [place] [address] Phone: [phone] Fax: [phone]     Social Determinants of Health (SDOH) Social History: SDOH Screenings   Food Insecurity: Patient Unable To Answer (04/27/2023)  Transportation Needs: No Transportation Needs (06/16/2018)  Financial Resource Strain: Low Risk  (06/16/2018)  Physical Activity: Inactive (06/16/2018)  Social Connections: Unknown (06/16/2018)  Stress: No Stress Concern Present (06/16/2018)  Tobacco Use: Low Risk  (04/26/2023)   SDOH Interventions:     Readmission Risk Interventions    04/28/2023    3:50 PM  Readmission Risk Prevention Plan  Transportation Screening Complete  PCP or Specialist Appt within 3-5 Days Complete  HRI or Home Care Consult Complete  Social Work Consult for Recovery Care Planning/Counseling Complete  Palliative Care Screening Not Applicable  Medication Review Oceanographer) Complete

## 2023-04-28 NOTE — Progress Notes (Signed)
  Progress Note   Patient: [name] RKY:[id number] DOB: 1933/03/30 DOA: 04/26/2023     2 DOS: the patient was seen and examined on 04/28/2023   Brief hospital course: Mr. [name] is an 89 year old female with history of advanced dementia, hypertension, hyperlipidemia, sinus bradycardia, recent Klebsiella UTI, who presents emergency department for concerns of ongoing diarrhea. Patient also had some dehydration, was given fluids.  Stool study positive for C. difficile toxin, patient is treated with Dificid for 10 days.   Principal Problem:   Encephalopathy acute Active Problems:   Carotid arterial disease (HCC)   Hyperlipidemia   Carotid artery disease (HCC)   Anemia, iron deficiency   Sinus bradycardia   Advanced dementia (HCC)   C. difficile diarrhea   Essential hypertension   Hypokalemia   Diarrhea   Pyuria   Assessment and Plan:  C. difficile colitis. Hypokalemia secondary to diarrhea. Patient developed C. difficile after taking antibiotics treating UTI. Still has significant diarrhea, continue Dificid. Condition improving.  Potassium normalized.   Acute metabolic encephalopathy. Advanced dementia. Patient had a worsening mental status time of admission, condition appears to be better.    Essential hypertension Resume home medicines.   Hyperlipidemia Resume home medicine     Subjective:  Patient is confused, but did not voice any complaint.  Physical Exam: Vitals:   04/27/23 1548 04/28/23 0001 04/28/23 0103 04/28/23 0900  BP: 136/83 129/84  (!) 144/81  Pulse: (!) 108 96 97 93  Resp: 16 20  16   Temp:  98.1 F (36.7 C)  98.8 F (37.1 C)  TempSrc:    Oral  SpO2: 98% 93% 98% 97%  Weight:      Height:       General exam: Appears calm and comfortable  Respiratory system: Clear to auscultation. Respiratory effort normal. Cardiovascular system: S1 & S2 heard, RRR. No JVD, murmurs, rubs, gallops or clicks. No pedal edema. Gastrointestinal  system: Abdomen is nondistended, soft and nontender. No organomegaly or masses felt. Normal bowel sounds heard. Central nervous system: Alert and oriented x1. No focal neurological deficits. Extremities: Symmetric 5 x 5 power. Skin: No rashes, lesions or ulcers Psychiatry: Flat affect.   Data Reviewed:  Lab results reviewed.  Family Communication: Son updated at bedside.  Disposition: Status is: Inpatient Remains inpatient appropriate because: Severity of disease.     Time spent: 35 minutes  Author: Marrion Coy, MD 04/28/2023 12:21 PM  For on call review [email or web address].

## 2023-04-29 ENCOUNTER — Telehealth (HOSPITAL_COMMUNITY): Payer: Self-pay | Admitting: Pharmacy Technician

## 2023-04-29 ENCOUNTER — Other Ambulatory Visit (HOSPITAL_COMMUNITY): Payer: Self-pay

## 2023-04-29 DIAGNOSIS — R627 Adult failure to thrive: Secondary | ICD-10-CM | POA: Insufficient documentation

## 2023-04-29 DIAGNOSIS — Z7189 Other specified counseling: Secondary | ICD-10-CM | POA: Diagnosis not present

## 2023-04-29 DIAGNOSIS — G9340 Encephalopathy, unspecified: Secondary | ICD-10-CM | POA: Diagnosis not present

## 2023-04-29 DIAGNOSIS — G301 Alzheimer's disease with late onset: Secondary | ICD-10-CM | POA: Diagnosis not present

## 2023-04-29 DIAGNOSIS — F02C0 Dementia in other diseases classified elsewhere, severe, without behavioral disturbance, psychotic disturbance, mood disturbance, and anxiety: Secondary | ICD-10-CM | POA: Diagnosis not present

## 2023-04-29 LAB — BASIC METABOLIC PANEL
Anion gap: 8 (ref 5–15)
BUN: 10 mg/dL (ref 8–23)
CO2: 24 mmol/L (ref 22–32)
Calcium: 8.7 mg/dL — ABNORMAL LOW (ref 8.9–10.3)
Chloride: 106 mmol/L (ref 98–111)
Creatinine, Ser: 0.82 mg/dL (ref 0.44–1.00)
GFR, Estimated: >60 mL/min (ref >60)
Glucose, Bld: 94 mg/dL (ref 70–99)
Potassium: 3.2 mmol/L — ABNORMAL LOW (ref 3.5–5.1)
Sodium: 138 mmol/L (ref 135–145)

## 2023-04-29 LAB — PHOSPHORUS: Phosphorus: 3.7 mg/dL (ref 2.5–4.6)

## 2023-04-29 MED ORDER — POTASSIUM CHLORIDE CRYS ER 20 MEQ PO TBCR
40.0000 meq | EXTENDED_RELEASE_TABLET | ORAL | Status: AC
  Administered 2023-04-29 (×2): 40 meq via ORAL
  Filled 2023-04-29: qty 2

## 2023-04-29 MED ORDER — MIRTAZAPINE 15 MG PO TABS
7.5000 mg | ORAL_TABLET | Freq: Every day | ORAL | Status: DC
  Administered 2023-04-29 – 2023-04-30 (×2): 7.5 mg via ORAL
  Filled 2023-04-29 (×4): qty 1

## 2023-04-29 MED ORDER — VANCOMYCIN HCL 125 MG PO CAPS
125.0000 mg | ORAL_CAPSULE | Freq: Four times a day (QID) | ORAL | Status: DC
  Administered 2023-04-29 – 2023-04-30 (×5): 125 mg via ORAL
  Filled 2023-04-29 (×7): qty 1

## 2023-04-29 MED ORDER — MEGESTROL ACETATE 400 MG/10ML PO SUSP
400.0000 mg | Freq: Every day | ORAL | Status: DC
  Administered 2023-04-29 – 2023-04-30 (×2): 400 mg via ORAL
  Filled 2023-04-29 (×2): qty 10

## 2023-04-29 MED ORDER — POTASSIUM CHLORIDE 10 MEQ/100ML IV SOLN
10.0000 meq | Freq: Once | INTRAVENOUS | Status: AC
  Administered 2023-04-29: 10 meq via INTRAVENOUS
  Filled 2023-04-29: qty 100

## 2023-04-29 NOTE — Plan of Care (Signed)

## 2023-04-29 NOTE — Telephone Encounter (Addendum)
Patient Product/process development scientist completed.    The patient is insured through [facility]. Patient has Medicare and is not eligible for a copay card, but may be able to apply for patient assistance, if available.    Ran test claim for Dificid 200 mg and the current 10 day co-pay is $1,530.57.  Ran test claim for vancomycin 125 mg and the current 10 day co-pay is $33.00.  This test claim was processed through [facility]- copay amounts may vary at other pharmacies due to pharmacy/plan contracts, or as the patient moves through the different stages of their insurance plan.     Roland Earl, CPHT Pharmacy Technician III Certified Patient Advocate [facility] Patient Advocate Team Direct Number: [phone]  Fax: [phone]

## 2023-04-29 NOTE — Progress Notes (Signed)
  Progress Note   Patient: [name] HKV:[id number] DOB: 06-03-1932 DOA: 04/26/2023     3 DOS: the patient was seen and examined on 04/29/2023   Brief hospital course: Mr. Krystalee Shibuya is an 89 year old female with history of advanced dementia, hypertension, hyperlipidemia, sinus bradycardia, recent Klebsiella UTI, who presents emergency department for concerns of ongoing diarrhea. Patient also had some dehydration, was given fluids.  Stool study positive for C. difficile toxin, patient is treated with Dificid for 10 days. 12/2.  Diarrhea improving, changed to oral vancomycin.   Principal Problem:   Encephalopathy acute Active Problems:   Carotid arterial disease (HCC)   Hyperlipidemia   Carotid artery disease (HCC)   Anemia, iron deficiency   Sinus bradycardia   Advanced dementia (HCC)   C. difficile diarrhea   Essential hypertension   Hypokalemia   Diarrhea   Pyuria   Assessment and Plan: C. difficile colitis. Hypokalemia secondary to diarrhea. Patient developed C. difficile after taking antibiotics treating UTI. Condition gradually improving, infectious pharmacy recommended changing to vancomycin.  Continue replete potassium.   Acute metabolic encephalopathy. Advanced dementia. Patient had a worsening mental status time of admission, condition appears to be better.   Failure to thrive. Anorexia. Per patient's son, patient has very poor appetite even before she developed C. difficile.  She is sleeping most of the time during the day.  Condition appears to be terminal, palliative care consult obtained.  Patient may be a candidate for hospice. Also talked about CODE STATUS, changed to DO NOT RESUSCITATE status. Added megestrol, changed antidepressant to Remeron to increase appetite.  Essential hypertension Resume home medicines.   Hyperlipidemia Resume home medicine        Subjective:  Patient is confused, diarrhea seems to be slowing  down.  Physical Exam: Vitals:   04/28/23 0900 04/28/23 1300 04/28/23 2206 04/29/23 0713  BP: (!) 144/81 (!) 140/72 139/81 (!) 144/58  Pulse: 93 81 79 68  Resp: 16 14  18   Temp: 98.8 F (37.1 C) 99.4 F (37.4 C) 98.3 F (36.8 C) 99.2 F (37.3 C)  TempSrc: Oral Skin    SpO2: 97% 95% 94% 95%  Weight:      Height:       General exam: Appears calm and comfortable  Respiratory system: Clear to auscultation. Respiratory effort normal. Cardiovascular system: S1 & S2 heard, RRR. No JVD, murmurs, rubs, gallops or clicks. No pedal edema. Gastrointestinal system: Abdomen is nondistended, soft and nontender. No organomegaly or masses felt. Normal bowel sounds heard. Central nervous system: Alert and oriented x1. No focal neurological deficits. Extremities: Symmetric 5 x 5 power. Skin: No rashes, lesions or ulcers Psychiatry: Flat affect   Data Reviewed:  Lab results reviewed.  Family Communication: Son updated at bedside.  Disposition: Status is: Inpatient Remains inpatient appropriate because: Unsafe discharge.     Time spent: 55 minutes  Author: Marrion Coy, MD 04/29/2023 11:53 AM  For on call review [email or web address].

## 2023-04-29 NOTE — Consult Note (Signed)
Consultation Note Date: 04/29/2023   Patient Name: [name]  DOB: 1932-12-29  MRN: [id number]  Age / Sex: 89 y.o., female  PCP: Lauro Regulus, MD Referring Physician: Marrion Coy, MD  Reason for Consultation: Establishing goals of care  HPI/Patient Profile: Mr. Nikiah Dominski is an 89 year old female with history of advanced dementia, hypertension, hyperlipidemia, sinus bradycardia, recent Klebsiella UTI, who presents emergency department for concerns of ongoing diarrhea. Patient also had some dehydration, was given fluids.  Stool study positive for C. difficile toxin, patient is treated with Dificid for 10 days. 12/2.  Diarrhea improving, changed to oral vancomycin.  Clinical Assessment and Goals of Care: Notes and labs reviewed. Per notes and discussion with TOC,  patient's son Trey Paula advises that patient was supposed to be moving into [place] memory care today.  In to see patient. She is currently in bed with a sitter bedside. Sitter states she has been restless and fidgeting with sheets. No family at bedside.  Patient has difficulty with word finding and repeats the same word of a sentence often.  She tells me that she is widowed.  She attempts to tell me the number of children she has but is unable.  She has not eaten her lunch and states that she is not hungry.  She denies complaint.     SUMMARY OF RECOMMENDATIONS  Attempted to call son Leotis Shames unsuccessfully as call went directly to voicemail.  PMT will follow-up again tomorrow.   Prognosis:  Poor overall      Primary Diagnoses: Present on Admission:  Encephalopathy acute  Sinus bradycardia  Advanced dementia (HCC)  Anemia, iron deficiency  Carotid arterial disease (HCC)  Carotid artery disease (HCC)  Hyperlipidemia   I have reviewed the medical record, interviewed the patient and family, and examined the patient. The  following aspects are pertinent.  Past Medical History:  Diagnosis Date   Anemia    Anemia    Bladder cancer (HCC)    Cancer (HCC) 2016   bladder tumor   Carotid arterial disease (HCC) 01/09/2014   Overview:  Minimal on screening    Chronic low back pain    Colon polyp    DDD (degenerative disc disease), cervical    DDD (degenerative disc disease), lumbar    Elevated lipids    Erosive esophagitis    Foot pain    GERD (gastroesophageal reflux disease)    Glaucoma    H/O concussion 04/2012   Due to fall at [facility]   Heart murmur    History of bone density study    History of chickenpox    HOH (hard of hearing)    Bilateral  Hearing Aids   Hypertension    Lower back pain    Lower extremity edema    Multinodular thyroid    Multiple gastric ulcers    Osteoporosis    Personal history of chemotherapy    bladder   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HCC)    Thyroid disease    Social History  Socioeconomic History   Marital status: Widowed    Spouse name: Not on file   Number of children: 3   Years of education: Not on file   Highest education level: Not on file  Occupational History   Not on file  Tobacco Use   Smoking status: Never   Smokeless tobacco: Never  Vaping Use   Vaping status: Never Used  Substance and Sexual Activity   Alcohol use: No   Drug use: No   Sexual activity: Never  Other Topics Concern   Not on file  Social History Narrative   Not on file   Social Determinants of Health   Financial Resource Strain: Low Risk  (06/16/2018)   Overall Financial Resource Strain (CARDIA)    Difficulty of Paying Living Expenses: Not hard at all  Food Insecurity: Patient Unable To Answer (04/27/2023)   Hunger Vital Sign    Worried About Running Out of Food in the Last Year: Patient unable to answer    Ran Out of Food in the Last Year: Patient unable to answer  Transportation Needs: No Transportation Needs (06/16/2018)   PRAPARE - Scientist, research (physical sciences) (Medical): No    Lack of Transportation (Non-Medical): No  Physical Activity: Inactive (06/16/2018)   Exercise Vital Sign    Days of Exercise per Week: 0 days    Minutes of Exercise per Session: 0 min  Stress: No Stress Concern Present (06/16/2018)   [employer] of Occupational Health - Occupational Stress Questionnaire    Feeling of Stress : Not at all  Social Connections: Unknown (06/16/2018)   Social Connection and Isolation Panel [NHANES]    Frequency of Communication with Friends and Family: More than three times a week    Frequency of Social Gatherings with Friends and Family: More than three times a week    Attends Religious Services: Not on file    Active Member of Clubs or Organizations: Yes    Attends Banker Meetings: More than 4 times per year    Marital Status: Widowed   Family History  Problem Relation Age of Onset   Heart attack Father    Emphysema Father    Heart disease Mother    Hypertension Mother    Diabetes type II Sister    Kidney disease Neg Hx    Bladder Cancer Neg Hx    Breast cancer Neg Hx    Scheduled Meds:  amLODipine  10 mg Oral Daily   calcium-vitamin D  1 tablet Oral Daily   cholecalciferol  1,000 Units Oral Daily   cyanocobalamin  2,000 mcg Oral Daily   donepezil  5 mg Oral QHS   heparin  5,000 Units Subcutaneous Q8H   latanoprost  1 drop Both Eyes QHS   megestrol  400 mg Oral Daily   mirtazapine  7.5 mg Oral QHS   pravastatin  40 mg Oral QHS   raloxifene  60 mg Oral Daily   vancomycin  125 mg Oral QID   Continuous Infusions: PRN Meds:.acetaminophen **OR** acetaminophen, hydrALAZINE, ondansetron **OR** ondansetron (ZOFRAN) IV Medications Prior to Admission:  Prior to Admission medications   Medication Sig Start Date End Date Taking? Authorizing Provider  amLODipine (NORVASC) 10 MG tablet Take 1 tablet (10 mg total) by mouth daily. 04/13/23  Yes Marrion Coy, MD  calcium-vitamin D (OSCAL WITH D) 500-200  MG-UNIT tablet Take 1 tablet by mouth daily.   Yes [provider]  Cholecalciferol (VITAMIN D3) 25 MCG (  1000 UT) CAPS Take 1,000 Units by mouth daily.   Yes [provider]  donepezil (ARICEPT) 5 MG tablet Take 5 mg by mouth at bedtime. 03/12/21  Yes [provider]  escitalopram (LEXAPRO) 10 MG tablet Take 1 tablet by mouth daily. 04/09/23 04/08/24 Yes [provider]  fosfomycin (MONUROL) 3 g PACK Take 3 g by mouth once. 04/12/23  Yes [provider]  latanoprost (XALATAN) 0.005 % ophthalmic solution Place 1 drop into both eyes at bedtime.   Yes [provider]  pravastatin (PRAVACHOL) 40 MG tablet Take 40 mg by mouth at bedtime.    Yes [provider]  raloxifene (EVISTA) 60 MG tablet Take 60 mg by mouth daily.   Yes [provider]  vitamin B-12 (CYANOCOBALAMIN) 1000 MCG tablet Take 2,000 mcg by mouth daily.   Yes [provider]   Allergies  Allergen Reactions   Tramadol Nausea And Vomiting   Review of Systems  Unable to perform ROS   Physical Exam Pulmonary:     Effort: Pulmonary effort is normal.  Neurological:     Mental Status: She is alert.     Vital Signs: BP 107/73 (BP Location: Right Arm)   Pulse (!) 102   Temp 98 F (36.7 C)   Resp 18   Ht 5\' 2"  (1.575 m)   Wt 70.2 kg   SpO2 93%   BMI 28.31 kg/m  Pain Scale: 0-10   Pain Score: 0-No pain   SpO2: SpO2: 93 % O2 Device:SpO2: 93 % O2 Flow Rate: .   IO: Intake/output summary: No intake or output data in the 24 hours ending 04/29/23 1601  LBM: Last BM Date : 04/29/23 Baseline Weight: Weight: 81.6 kg Most recent weight: Weight: 70.2 kg        Signed by: Morton Stall, NP   Please contact Palliative Medicine Team phone at [phone] for questions and concerns.  For individual provider: See Loretha Stapler

## 2023-04-29 NOTE — Progress Notes (Signed)
Physical Therapy Treatment Patient Details Name: [name] MRN: [id number] DOB: [birth year] Today's Date: 04/29/2023   History of Present Illness Pt is 89 y/o admitted 04/26/23 for acute encephalopathy. Family has noted a change in cognitive status as an increase in confusion and agitation has been noticed the past few weeks. PmHx includes: advanced dementia, HTN, hyperlipidemia, klebsiella UTI, and sinus brachycardia.    PT Comments  Pt was pleasant and motivated to participate during the session and put forth good effort throughout.  Pt performed most of sup to sit independently with extra time and effort but needed minimal assist at the end to get positioned correctly.  Pt required cuing for transfer sequencing but was steady with good control and was able to amb 90 feet doing laps in her room with no overt LOB.  Cues given for amb closer to the RW with upright posture but pt demonstrated poor carryover.  Pt will benefit from continued PT services upon discharge to safely address deficits listed in patient problem list for decreased caregiver assistance and eventual return to PLOF.      If plan is discharge home, recommend the following: A lot of help with bathing/dressing/bathroom;A little help with walking and/or transfers;Assistance with cooking/housework;Assist for transportation;Help with stairs or ramp for entrance;Supervision due to cognitive status;Direct supervision/assist for medications management   Can travel by private vehicle        Equipment Recommendations  None recommended by PT    Recommendations for Other Services       Precautions / Restrictions Precautions Precautions: Fall Restrictions Weight Bearing Restrictions: No     Mobility  Bed Mobility Overal bed mobility: Needs Assistance Bed Mobility: Supine to Sit     Supine to sit: Min assist     General bed mobility comments: Pt able to get from supine almost to sittng at EOB with only min A for  final positioning    Transfers Overall transfer level: Needs assistance Equipment used: Rolling walker (2 wheels) Transfers: Sit to/from Stand Sit to Stand: Contact guard assist           General transfer comment: Min to mod A for hand positioning    Ambulation/Gait Ambulation/Gait assistance: Contact guard assist Gait Distance (Feet): 90 Feet Assistive device: Rolling walker (2 wheels) Gait Pattern/deviations: Step-through pattern, Decreased step length - right, Decreased step length - left, Trunk flexed Gait velocity: decreased     General Gait Details: Slow cadence but generally steady with no overt LOB   Stairs             Wheelchair Mobility     Tilt Bed    Modified Rankin (Stroke Patients Only)       Balance Overall balance assessment: Needs assistance Sitting-balance support: Feet supported Sitting balance-Leahy Scale: Good     Standing balance support: During functional activity, Bilateral upper extremity supported, Reliant on assistive device for balance Standing balance-Leahy Scale: Fair                              Cognition Arousal: Alert Behavior During Therapy: WFL for tasks assessed/performed Overall Cognitive Status: No family/caregiver present to determine baseline cognitive functioning                                 General Comments: Followed most 1-step commands well with min extra time and cuing  Exercises      General Comments        Pertinent Vitals/Pain Pain Assessment Pain Assessment: No/denies pain    Home Living                          Prior Function            PT Goals (current goals can now be found in the care plan section) Progress towards PT goals: Progressing toward goals    Frequency    Min 1X/week      PT Plan      Co-evaluation              AM-PAC PT "6 Clicks" Mobility   Outcome Measure  Help needed turning from your back to your  side while in a flat bed without using bedrails?: None Help needed moving from lying on your back to sitting on the side of a flat bed without using bedrails?: A Little Help needed moving to and from a bed to a chair (including a wheelchair)?: A Little Help needed standing up from a chair using your arms (e.g., wheelchair or bedside chair)?: A Little Help needed to walk in hospital room?: A Little Help needed climbing 3-5 steps with a railing? : A Little 6 Click Score: 19    End of Session Equipment Utilized During Treatment: Gait belt Activity Tolerance: Patient tolerated treatment well Patient left: in chair;with call bell/phone within reach;with nursing/sitter in room (Chair alarm left turned off per nursing request secondary to sitter in room) Nurse Communication: Mobility status PT Visit Diagnosis: Unsteadiness on feet (R26.81);Other abnormalities of gait and mobility (R26.89);Muscle weakness (generalized) (M62.81);Difficulty in walking, not elsewhere classified (R26.2)     Time: 1610-9604 PT Time Calculation (min) (ACUTE ONLY): 27 min  Charges:    $Gait Training: 8-22 mins $Therapeutic Activity: 8-22 mins PT General Charges $$ ACUTE PT VISIT: 1 Visit                     D. Scott Deavion Dobbs PT, DPT 04/29/23, 10:38 AM

## 2023-04-29 NOTE — Plan of Care (Signed)
?  Problem: Clinical Measurements: ?Goal: Will remain free from infection ?Outcome: Progressing ?  ?

## 2023-04-29 NOTE — Care Management Important Message (Signed)
Important Message  Patient Details  Name: [name] MRN: [id number] Date of Birth: 09/18/1932   Important Message Given:  N/A - LOS <3 / Initial given by admissions     Olegario Messier A Yina Riviere 04/29/2023, 12:34 PM

## 2023-04-29 NOTE — Evaluation (Signed)
Speech Language Pathology Evaluation Patient Details Name: [name] MRN: [id number] DOB: 1932-06-19 Today's Date: 04/29/2023 Time: 6962-9528 SLP Time Calculation (min) (ACUTE ONLY): 29 min  Problem List:  Patient Active Problem List   Diagnosis Date Noted   Encephalopathy acute 04/26/2023   C. difficile diarrhea 04/26/2023   Essential hypertension 04/26/2023   Hypokalemia 04/26/2023   Diarrhea 04/26/2023   Pyuria 04/26/2023   Acute metabolic encephalopathy 04/12/2023   UTI (urinary tract infection) 04/12/2023   Advanced dementia (HCC) 04/12/2023   Sinus bradycardia 04/11/2023   Bradycardia 04/11/2023   Varicose veins of leg with swelling, bilateral 04/08/2018   Status post total left knee replacement 01/10/2018   Monoclonal gammopathy of unknown significance (MGUS) 06/11/2016   Abscess of gluteal region 02/21/2016   Psoas abscess (HCC) 02/21/2016   Malignant neoplasm of urinary bladder (HCC) 08/23/2015   Status post total right knee replacement 08/23/2015   Impingement syndrome of shoulder 03/30/2015   Impingement syndrome of right shoulder 03/30/2015   Anemia, iron deficiency 02/02/2015   IDA (iron deficiency anemia) 01/26/2015   Malignant neoplasm of bladder neck (HCC) 12/30/2014   Colon polyp 12/17/2014   Inflammation of sacroiliac joint (HCC) 09/22/2014   Carotid arterial disease (HCC) 01/09/2014   Hyperlipidemia 01/09/2014   Accumulation of fluid in tissues 01/09/2014   Benign hypertension with CKD (chronic kidney disease) stage III (HCC) 01/09/2014   Arthritis, degenerative 01/09/2014   OP (osteoporosis) 01/09/2014   Carotid artery disease (HCC) 01/09/2014   Edema 01/09/2014   Cervical osteoarthritis 10/28/2013   DDD (degenerative disc disease), cervical 10/28/2013   Acute blood loss anemia 05/16/2012   Fracture of parietal bone (HCC) 05/16/2012   Fall down stairs 05/16/2012   Hemorrhage into subarachnoid space of neuraxis (HCC) 05/16/2012   Subdural  hematoma (HCC) 05/16/2012   Fracture of temporal bone (HCC) 05/16/2012   Traumatic subdural hemorrhage with loss of consciousness (HCC) 05/16/2012   Past Medical History:  Past Medical History:  Diagnosis Date   Anemia    Anemia    Bladder cancer (HCC)    Cancer (HCC) 2016   bladder tumor   Carotid arterial disease (HCC) 01/09/2014   Overview:  Minimal on screening    Chronic low back pain    Colon polyp    DDD (degenerative disc disease), cervical    DDD (degenerative disc disease), lumbar    Elevated lipids    Erosive esophagitis    Foot pain    GERD (gastroesophageal reflux disease)    Glaucoma    H/O concussion 04/2012   Due to fall at [facility]   Heart murmur    History of bone density study    History of chickenpox    HOH (hard of hearing)    Bilateral  Hearing Aids   Hypertension    Lower back pain    Lower extremity edema    Multinodular thyroid    Multiple gastric ulcers    Osteoporosis    Personal history of chemotherapy    bladder   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HCC)    Thyroid disease    Past Surgical History:  Past Surgical History:  Procedure Laterality Date   ABDOMINAL HYSTERECTOMY  1973   APPLICATION OF WOUND VAC Right 01/17/2017   Procedure: APPLICATION OF WOUND VAC;  Surgeon: Kennedy Bucker, MD;  Location: [facility] ORS;  Service: Orthopedics;  Laterality: Right;   BACK SURGERY     03/1975-ruptured disk, 11/1983-ruptured disk, 11/1984-ruptured disk, 10/1996-ruptured disk, 08/1997-spinal fusion, 02/10/07-spinal fusion  [facility]   BACK SURGERY     Spinal Fusion X 2   CARPAL TUNNEL RELEASE Right    CATARACT EXTRACTION Bilateral    CYSTOSCOPY W/ RETROGRADES Bilateral 09/29/2014   Procedure: CYSTOSCOPY WITH RETROGRADE PYELOGRAM;  Surgeon: Vanna Scotland, MD;  Location: [facility] ORS;  Service: Urology;  Laterality: Bilateral;   CYSTOSCOPY WITH BIOPSY N/A 11/02/2014   Procedure: CYSTOSCOPY WITH BIOPSY/WITH MITOMYCIN;  Surgeon: Vanna Scotland, MD;  Location: [facility] ORS;   Service: Urology;  Laterality: N/A;   ESOPHAGOGASTRODUODENOSCOPY (EGD) WITH PROPOFOL N/A 05/10/2015   Procedure: ESOPHAGOGASTRODUODENOSCOPY (EGD) WITH PROPOFOL;  Surgeon: Christena Deem, MD;  [facility];  Service: Endoscopy;  Laterality: N/A;   ESOPHAGOGASTRODUODENOSCOPY (EGD) WITH PROPOFOL N/A 01/08/2018   Procedure: ESOPHAGOGASTRODUODENOSCOPY (EGD) WITH PROPOFOL;  Surgeon: Toledo, Boykin Nearing, MD;  Location: [facility] ENDOSCOPY;  Service: Gastroenterology;  Laterality: N/A;   EYE SURGERY Bilateral    Cataract Extraction with IOL   FOOT SURGERY Right    INCISION AND DRAINAGE ABSCESS Right 11/20/2016   Procedure: INCISION AND DRAINAGE ABSCESS GLUTEAL;  Surgeon: Kieth Brightly, MD;  Location: [facility] ORS;  Service: General;  Laterality: Right;   INCISION AND DRAINAGE ABSCESS Right 01/17/2017   Procedure: INCISION AND DRAINAGE GLUTEAL ABSCESS;  Surgeon: Kennedy Bucker, MD;  Location: [facility] ORS;  Service: Orthopedics;  Laterality: Right;   IRRIGATION AND DEBRIDEMENT BUTTOCKS Right 10/21/2015   Procedure: DRAINAGE GLUTEAL ABSCESS;  Surgeon: Kieth Brightly, MD;  Location: [facility] ORS;  Service: General;  Laterality: Right;   KNEE ARTHROPLASTY Left 01/10/2018   Procedure: COMPUTER ASSISTED TOTAL KNEE ARTHROPLASTY;  Surgeon: Donato Heinz, MD;  Location: [facility] ORS;  Service: Orthopedics;  Laterality: Left;   KNEE ARTHROPLASTY Right 06/16/2018   Procedure: COMPUTER ASSISTED TOTAL KNEE ARTHROPLASTY;  Surgeon: Donato Heinz, MD;  Location: [facility] ORS;  Service: Orthopedics;  Laterality: Right;   KNEE ARTHROSCOPY Right    x2   MANDIBLE SURGERY Bilateral    x2   PICC LINE INSERTION Right 01/08/2017   SHOULDER ARTHROSCOPY WITH ROTATOR CUFF REPAIR AND SUBACROMIAL DECOMPRESSION Right 05/25/2015   Procedure: SHOULDER ARTHROSCOPY WITH ROTATOR CUFF REPAIR AND SUBACROMIAL DECOMPRESSION, release long head biceps tendon;  Surgeon: Erin Sons, MD;  Location: [facility] ORS;  Service: Orthopedics;   Laterality: Right;   TRANSURETHRAL RESECTION OF BLADDER TUMOR N/A 09/29/2014   Procedure: TRANSURETHRAL RESECTION OF BLADDER TUMOR (TURBT);  Surgeon: Vanna Scotland, MD;  Location: [facility] ORS;  Service: Urology;  Laterality: N/A;   WRIST FRACTURE SURGERY Left    HPI:  89yo female admitted 04/26/23 with diarrhea. PMH: advanced dementia, CAD, anemia, erosive esophagitis, GERD, HOH, HTN, HLD, sinus bradycardia, recent UTI. Increasing agitation and confusion x2-3 weeks per son.   Assessment / Plan / Recommendation Clinical Impression  Pt seen today for cognitive linguistic evaluation in the setting of baseline advanced dementia. Pt presents with adequate orofacial strength. Speech is intelligible. Receptive and Expressive Language are impaired as relates to poor short term recall and decreased working memory. Pt also presents with decreased hearing acuity, which exacerbates the situation. The Mini-Mental State Examination (MMSE) was administered today. Pt scored 2/30, indicating significant neurocognitive impairment. Points were scored on naming 1/2 common objects and following 1/3 step command. She was unable to verbalize orientation to time or place, and unable to demonstrate immediate or delayed recall of 3 words. She was unable to spell the word "World" forward or backward. Pt unable to repeat phrase length material, write a sentence, or copy a simple design. Spontaneous verbalization  was pleasant and cooperative. Given baseline level of impairment, recommend 24 hour supervision. Short term follow up with SLP at next venue of care may be beneficial to provide family education. Acute SLP signing off at this time.    SLP Assessment  SLP Recommendation/Assessment: All further Speech Language Pathology needs can be addressed in the next venue of care  SLP Visit Diagnosis: Cognitive communication deficit (R41.841)    Recommendations for follow up therapy are one component of a multi-disciplinary discharge  planning process, led by the attending physician.  Recommendations may be updated based on patient status, additional functional criteria and insurance authorization.    Follow Up Recommendations  Follow physician's recommendations for discharge plan and follow up therapies    Assistance Recommended at Discharge  Frequent or constant Supervision/Assistance  Functional Status Assessment Patient has not had a recent decline in their functional status        SLP Evaluation Cognition  Overall Cognitive Status: No family/caregiver present to determine baseline cognitive functioning Orientation Level: Disoriented to place;Disoriented to time;Disoriented to situation;Disoriented to person (accurate for name, unable to state DOB)       Comprehension  Auditory Comprehension Overall Auditory Comprehension: Impaired at baseline Other Conversation Comments: pt follows simple commands due to decreased short term memory Interfering Components: Attention;Working memory EffectiveTechniques: Extra processing time;Repetition;Increased volume;Slowed speech;Stressing words Reading Comprehension Reading Status: Impaired Sentence Level:  (unable to follow written command "close your eyes")    Expression Expression Primary Mode of Expression: Verbal Verbal Expression Overall Verbal Expression: Impaired at baseline Initiation: No impairment Automatic Speech: Name Level of Generative/Spontaneous Verbalization: Sentence Repetition: Impaired Level of Impairment: Word level Naming: Impairment Written Expression Dominant Hand: Right Written Expression: Unable to assess (comment)   Oral / Motor  Oral Motor/Sensory Function Overall Oral Motor/Sensory Function: Within functional limits Motor Speech Overall Motor Speech: Appears within functional limits for tasks assessed Respiration: Within functional limits Phonation: Normal Resonance: Within functional limits Articulation: Within functional  limitis Intelligibility: Intelligible Motor Planning: Witnin functional limits Motor Speech Errors: Not applicable           Nikkolas Coomes B. Murvin Natal, [facility], CCC-SLP Speech Language Pathologist  [name] 04/29/2023, 11:49 AM

## 2023-04-29 NOTE — Therapy (Signed)
SLP Cancellation Note  Patient Details Name: [name] MRN: [id number] DOB: 01/18/1933   Cancelled treatment:   Unable to complete SLE at this time, as pt is currently with therapy. Will continue efforts.   Of note, per H&P, pt has advanced dementia at baseline, with 2-3 weeks history of increasing agitation and confusion.  [name] B. Murvin Natal, [facility], CCC-SLP Speech Language Pathologist  [name] 04/29/2023, 9:58 AM

## 2023-04-30 DIAGNOSIS — Z7189 Other specified counseling: Secondary | ICD-10-CM | POA: Diagnosis not present

## 2023-04-30 DIAGNOSIS — F02C0 Dementia in other diseases classified elsewhere, severe, without behavioral disturbance, psychotic disturbance, mood disturbance, and anxiety: Secondary | ICD-10-CM | POA: Diagnosis not present

## 2023-04-30 DIAGNOSIS — G9340 Encephalopathy, unspecified: Secondary | ICD-10-CM | POA: Diagnosis not present

## 2023-04-30 DIAGNOSIS — G301 Alzheimer's disease with late onset: Secondary | ICD-10-CM | POA: Diagnosis not present

## 2023-04-30 DIAGNOSIS — A0472 Enterocolitis due to Clostridium difficile, not specified as recurrent: Secondary | ICD-10-CM | POA: Diagnosis not present

## 2023-04-30 LAB — BASIC METABOLIC PANEL
Anion gap: 8 (ref 5–15)
BUN: 14 mg/dL (ref 8–23)
CO2: 24 mmol/L (ref 22–32)
Calcium: 8.7 mg/dL — ABNORMAL LOW (ref 8.9–10.3)
Chloride: 107 mmol/L (ref 98–111)
Creatinine, Ser: 0.94 mg/dL (ref 0.44–1.00)
GFR, Estimated: 58 mL/min — ABNORMAL LOW (ref >60)
Glucose, Bld: 99 mg/dL (ref 70–99)
Potassium: 3.6 mmol/L (ref 3.5–5.1)
Sodium: 139 mmol/L (ref 135–145)

## 2023-04-30 LAB — MAGNESIUM: Magnesium: 2.2 mg/dL (ref 1.7–2.4)

## 2023-04-30 MED ORDER — GLYCOPYRROLATE 0.2 MG/ML IJ SOLN
0.2000 mg | INTRAMUSCULAR | Status: DC | PRN
  Administered 2023-05-04: 0.2 mg via SUBCUTANEOUS
  Filled 2023-04-30: qty 1

## 2023-04-30 MED ORDER — POLYVINYL ALCOHOL 1.4 % OP SOLN: 1.0000 [drp] | Freq: Four times a day (QID) | OPHTHALMIC | Status: DC | PRN

## 2023-04-30 MED ORDER — GLYCOPYRROLATE 1 MG PO TABS: 1.0000 mg | ORAL_TABLET | ORAL | Status: DC | PRN

## 2023-04-30 MED ORDER — MORPHINE SULFATE (PF) 2 MG/ML IV SOLN
1.0000 mg | INTRAVENOUS | Status: DC | PRN
  Administered 2023-05-01: 2 mg via INTRAVENOUS
  Filled 2023-04-30: qty 1

## 2023-04-30 MED ORDER — LORAZEPAM 1 MG PO TABS
1.0000 mg | ORAL_TABLET | ORAL | Status: DC | PRN
  Filled 2023-04-30 (×2): qty 1

## 2023-04-30 MED ORDER — BIOTENE DRY MOUTH MT LIQD: 15.0000 mL | OROMUCOSAL | Status: DC | PRN

## 2023-04-30 MED ORDER — LORAZEPAM 2 MG/ML PO CONC
1.0000 mg | ORAL | Status: DC | PRN
  Administered 2023-05-02 – 2023-05-04 (×4): 1 mg via SUBLINGUAL
  Filled 2023-04-30: qty 1
  Filled 2023-04-30 (×3): qty 0.5

## 2023-04-30 MED ORDER — GLYCOPYRROLATE 0.2 MG/ML IJ SOLN: 0.2000 mg | INTRAMUSCULAR | Status: DC | PRN

## 2023-04-30 MED ORDER — HALOPERIDOL LACTATE 5 MG/ML IJ SOLN: 0.5000 mg | INTRAMUSCULAR | Status: DC | PRN

## 2023-04-30 MED ORDER — LORAZEPAM 2 MG/ML IJ SOLN: 1.0000 mg | INTRAMUSCULAR | Status: DC | PRN

## 2023-04-30 MED ORDER — HALOPERIDOL 0.5 MG PO TABS: 0.5000 mg | ORAL_TABLET | ORAL | Status: DC | PRN

## 2023-04-30 MED ORDER — HALOPERIDOL LACTATE 2 MG/ML PO CONC: 0.5000 mg | ORAL | Status: DC | PRN

## 2023-04-30 NOTE — Progress Notes (Signed)
  Progress Note   Patient: [name]:[id number] DOB: 1933-02-07 DOA: 04/26/2023     4 DOS: the patient was seen and examined on 04/30/2023   Brief hospital course: Mr. [name] is an 89 year old female with history of advanced dementia, hypertension, hyperlipidemia, sinus bradycardia, recent Klebsiella UTI, who presents emergency department for concerns of ongoing diarrhea. Patient also had some dehydration, was given fluids.  Stool study positive for C. difficile toxin, patient is treated with Dificid for 10 days. 12/2.  Diarrhea improving, changed to oral vancomycin. 12/3.  Patient is seen by palliative care, transition to comfort care.   Principal Problem:   Encephalopathy acute Active Problems:   Carotid arterial disease (HCC)   Hyperlipidemia   Carotid artery disease (HCC)   Anemia, iron deficiency   Sinus bradycardia   Advanced dementia (HCC)   C. difficile diarrhea   Essential hypertension   Hypokalemia   Diarrhea   Pyuria   Failure to thrive in adult   Assessment and Plan:  C. difficile colitis. Hypokalemia secondary to diarrhea. Patient developed C. difficile after taking antibiotics treating UTI. Patient is seen by palliative care, due to poor prognosis, transition to comfort care.     Acute metabolic encephalopathy. Advanced dementia. Comfort care.   Failure to thrive. Anorexia. Per patient's son, patient has very poor appetite even before she developed C. difficile.  She is sleeping most of the time during the day.  Condition appears to be terminal, palliative care consult obtained.  Patient may be a candidate for hospice. Also talked about CODE STATUS, changed to DO NOT RESUSCITATE status. Continue comfort care.   Essential hypertension Resume home medicines.   Hyperlipidemia Discontinued statin.       Subjective:  Has not been eating, sleeping all day long.  Very confused.  Physical Exam: Vitals:   04/29/23 0713 04/29/23  1544 04/29/23 2155 04/30/23 0754  BP: (!) 144/58 107/73 137/77 133/75  Pulse: 68 (!) 102 87 77  Resp: 18 18 20 19   Temp: 99.2 F (37.3 C) 98 F (36.7 C) 98.2 F (36.8 C) (!) 97.4 F (36.3 C)  TempSrc:   Axillary   SpO2: 95% 93% 98% 98%  Weight:      Height:       General exam: Appears calm and comfortable  Respiratory system: Clear to auscultation. Respiratory effort normal. Cardiovascular system: S1 & S2 heard, RRR. No JVD, murmurs, rubs, gallops or clicks. No pedal edema. Gastrointestinal system: Abdomen is nondistended, soft and nontender. No organomegaly or masses felt. Normal bowel sounds heard. Central nervous system: Drowsy and confused. Extremities: Symmetric 5 x 5 power. Skin: No rashes, lesions or ulcers    Data Reviewed:  Lab results reviewed.  Family Communication: Son updated at bedside.  Disposition: Status is: Inpatient Remains inpatient appropriate because: Comfort care.     Time spent: 35 minutes  Author: Marrion Coy, MD 04/30/2023 11:37 AM  For on call review [email or web address].

## 2023-04-30 NOTE — Progress Notes (Signed)
Daily Progress Note   Patient Name: [name]       Date: 04/30/2023 DOB: Oct 20, 1932  Age: 89 y.o. MRN#: [id number] Attending Physician: Marrion Coy, MD Primary Care Physician: Lauro Regulus, MD Admit Date: 04/26/2023  Reason for Consultation/Follow-up: Establishing goals of care  Subjective: Notes and labs reviewed.  Into see patient.  She is currently resting in bed with eyes closed.  Sitter is at bedside.  Son Trey Paula is present.    Stepped out to speak with Trey Paula.  He states that his mother [place].  He states patient is widowed and there are 3 sons.  He states his mother technically lives alone, and they have cameras in the home for safety.  He advises there are caregivers that come to assist.  He states over the past 2 years he has been assisting with medications and meals.  He states at baseline she does not use assistive devices.  He states a few weeks ago, she was able to do things like make herself a sandwich though sometimes she would use the wrong condiments.  States she was able to open drinks for herself or for herself to eat.  Trey Paula states over the past few weeks she has needed around-the-clock assistance, and was scheduled to move into [place] assisted living.  We discussed her diagnoses, prognosis, GOC, EOL wishes disposition and options.  Created space and opportunity for patient  to explore thoughts and feelings regarding current medical information.   A detailed discussion was had today regarding advanced directives.  Concepts specific to code status, artifical feeding and hydration, IV antibiotics and rehospitalization were discussed.  The difference between an aggressive medical intervention path and a comfort care path was discussed.   Values and goals of care important to patient and family were attempted to be elicited.  Discussed limitations of medical interventions to prolong quality of life in some situations and discussed the concept of human mortality.  Trey Paula states quality of life is very important for his mother.  With conversation he states he would like to transition to full comfort care with no further life-prolonging care including further antibiotics.  Trey Paula called his brother Fredrik Cove who agrees with a plan for comfort care no further life-prolonging measures.  He also called his brother Raiford Noble who also  agrees with a plan for comfort care and no further life-prolonging measures.    Trey Paula states he is patient's healthcare power of attorney but is unable to locate the document.  Brothers Raiford Noble and Geographical information systems officer agree with this and both confirm that they are amenable to [place] making all healthcare decisions and updating them as needed.  I completed a MOST form today and the signed original was placed in the chart. Each section of options on the form were reviewed in full detail and any questions were answered as needed. The form was scanned and sent to medical records for it to be uploaded under ACP tab in Epic. A photocopy was also placed in the chart to be scanned into EMR. The patient outlined their wishes for the following treatment decisions:  Cardiopulmonary Resuscitation: Do Not Attempt Resuscitation (DNR/No CPR)  Medical Interventions: Comfort Measures: Keep clean, warm, and dry. Use medication by any route, positioning, wound care, and other measures to relieve pain and suffering. Use oxygen, suction and manual treatment of airway obstruction as needed for comfort. Do not transfer to the hospital unless comfort needs cannot be met in current location.  Antibiotics: No antibiotics (use other measures to relieve symptoms)  IV Fluids: No IV fluids (provide other measures to ensure comfort)  Feeding Tube: No feeding tube        Length of Stay: 4  Current Medications: Scheduled Meds:   amLODipine  10 mg Oral Daily   latanoprost  1 drop Both Eyes QHS   mirtazapine  7.5 mg Oral QHS    Continuous Infusions:   PRN Meds: acetaminophen **OR** acetaminophen, antiseptic oral rinse, glycopyrrolate **OR** glycopyrrolate **OR** glycopyrrolate, haloperidol **OR** haloperidol **OR** haloperidol lactate, hydrALAZINE, LORazepam **OR** LORazepam **OR** LORazepam, morphine injection, ondansetron **OR** ondansetron (ZOFRAN) IV, polyvinyl alcohol  Physical Exam Constitutional:      Comments: Eyes closed  Pulmonary:     Effort: Pulmonary effort is normal.             Vital Signs: BP 133/75 (BP Location: Left Arm)   Pulse 77   Temp (!) 97.4 F (36.3 C)   Resp 19   Ht 5\' 2"  (1.575 m)   Wt 70.2 kg   SpO2 98%   BMI 28.31 kg/m  SpO2: SpO2: 98 % O2 Device: O2 Device: Room Air O2 Flow Rate:    Intake/output summary: No intake or output data in the 24 hours ending 04/30/23 1229 LBM: Last BM Date : 04/29/23 Baseline Weight: Weight: 81.6 kg Most recent weight: Weight: 70.2 kg   Patient Active Problem List   Diagnosis Date Noted   Failure to thrive in adult 04/29/2023   Encephalopathy acute 04/26/2023   C. difficile diarrhea 04/26/2023   Essential hypertension 04/26/2023   Hypokalemia 04/26/2023   Diarrhea 04/26/2023   Pyuria 04/26/2023   Acute metabolic encephalopathy 04/12/2023   UTI (urinary tract infection) 04/12/2023   Advanced dementia (HCC) 04/12/2023   Sinus bradycardia 04/11/2023   Bradycardia 04/11/2023   Varicose veins of leg with swelling, bilateral 04/08/2018   Status post total left knee replacement 01/10/2018   Monoclonal gammopathy of unknown significance (MGUS) 06/11/2016   Abscess of gluteal region 02/21/2016   Psoas abscess (HCC) 02/21/2016   Malignant neoplasm of urinary bladder (HCC) 08/23/2015   Status post total right knee replacement 08/23/2015   Impingement syndrome of  shoulder 03/30/2015   Impingement syndrome of right shoulder 03/30/2015   Anemia, iron deficiency 02/02/2015   IDA (iron deficiency anemia) 01/26/2015  Malignant neoplasm of bladder neck (HCC) 12/30/2014   Colon polyp 12/17/2014   Inflammation of sacroiliac joint (HCC) 09/22/2014   Carotid arterial disease (HCC) 01/09/2014   Hyperlipidemia 01/09/2014   Accumulation of fluid in tissues 01/09/2014   Benign hypertension with CKD (chronic kidney disease) stage III (HCC) 01/09/2014   Arthritis, degenerative 01/09/2014   OP (osteoporosis) 01/09/2014   Carotid artery disease (HCC) 01/09/2014   Edema 01/09/2014   Cervical osteoarthritis 10/28/2013   DDD (degenerative disc disease), cervical 10/28/2013   Acute blood loss anemia 05/16/2012   Fracture of parietal bone (HCC) 05/16/2012   Fall down stairs 05/16/2012   Hemorrhage into subarachnoid space of neuraxis (HCC) 05/16/2012   Subdural hematoma (HCC) 05/16/2012   Fracture of temporal bone (HCC) 05/16/2012   Traumatic subdural hemorrhage with loss of consciousness (HCC) 05/16/2012    Palliative Care Assessment & Plan    Recommendations/Plan: Patient transition to full comfort care.  Patient's children do not want any further life-prolonging care including continued antibiotics.  They would like to speak with hospice liaison about hospice facility placement.  Code Status:    Code Status Orders  (From admission, onward)           Start     Ordered   04/30/23 1118  Do not attempt resuscitation (DNR) - Comfort care  Continuous       Question Answer Comment  If patient has no pulse and is not breathing Do Not Attempt Resuscitation   In Pre-Arrest Conditions (Patient Is Breathing and Has a Pulse) Provide comfort measures. Relieve any mechanical airway obstruction. Avoid transfer unless required for comfort.   Consent: Discussion documented in EHR or advanced directives reviewed      04/30/23 1118           Code Status  History     Date Active Date Inactive Code Status Order ID Comments User Context   04/30/2023 1113 04/30/2023 1118 Do not attempt resuscitation (DNR) - Comfort care [id number]  Morton Stall, NP Inpatient   04/29/2023 0856 04/30/2023 1113 Do not attempt resuscitation (DNR) PRE-ARREST INTERVENTIONS DESIRED [id number]  Marrion Coy, MD Inpatient   04/26/2023 1424 04/29/2023 0856 Full Code [id number]  Cox, Amy N, DO ED   04/11/2023 1315 04/12/2023 1638 Full Code [id number]  Emeline General, MD ED   06/16/2018 1757 06/18/2018 1644 Full Code [id number]  Donato Heinz, MD Inpatient   01/10/2018 1554 01/12/2018 1822 Full Code [id number]  Donato Heinz, MD Inpatient   01/17/2017 1402 01/17/2017 1820 Full Code [id number]  Kennedy Bucker, MD Inpatient      Advance Directive Documentation    Flowsheet Row Most Recent Value  Type of Advance Directive Healthcare Power of Attorney  Pre-existing out of facility DNR order (yellow form or pink MOST form) --  "MOST" Form in Place? --       Prognosis:  < 2 weeks    Care plan was discussed with attending and TOC via epic chat  Thank you for allowing the Palliative Medicine Team to assist in the care of this patient.    Morton Stall, NP  Please contact Palliative Medicine Team phone at [phone] for questions and concerns.

## 2023-04-30 NOTE — Plan of Care (Signed)
  Problem: Education: Goal: Knowledge of General Education information will improve Description: Including pain rating scale, medication(s)/side effects and non-pharmacologic comfort measures Outcome: Progressing   Problem: Health Behavior/Discharge Planning: Goal: Ability to manage health-related needs will improve Outcome: Progressing   Problem: Clinical Measurements: Goal: Ability to maintain clinical measurements within normal limits will improve Outcome: Progressing Goal: Will remain free from infection Outcome: Progressing Goal: Diagnostic test results will improve Outcome: Progressing Goal: Respiratory complications will improve Outcome: Progressing Goal: Cardiovascular complication will be avoided Outcome: Progressing   Problem: Activity: Goal: Risk for activity intolerance will decrease Outcome: Progressing   Problem: Nutrition: Goal: Adequate nutrition will be maintained Outcome: Progressing   Problem: Coping: Goal: Level of anxiety will decrease Outcome: Progressing   Problem: Elimination: Goal: Will not experience complications related to bowel motility Outcome: Progressing Goal: Will not experience complications related to urinary retention Outcome: Progressing   Problem: Pain Management: Goal: General experience of comfort will improve Outcome: Progressing   Problem: Safety: Goal: Ability to remain free from injury will improve Outcome: Progressing   Problem: Skin Integrity: Goal: Risk for impaired skin integrity will decrease Outcome: Progressing   Problem: Education: Goal: Knowledge of the prescribed therapeutic regimen will improve Outcome: Progressing   Problem: Coping: Goal: Ability to identify and develop effective coping behavior will improve Outcome: Progressing   Problem: Clinical Measurements: Goal: Quality of life will improve Outcome: Progressing   Problem: Respiratory: Goal: Verbalizations of increased ease of respirations  will increase Outcome: Progressing   Problem: Role Relationship: Goal: Family's ability to cope with current situation will improve Outcome: Progressing Goal: Ability to verbalize concerns, feelings, and thoughts to partner or family member will improve Outcome: Progressing   Problem: Pain Management: Goal: Satisfaction with pain management regimen will improve Outcome: Progressing

## 2023-04-30 NOTE — Plan of Care (Signed)

## 2023-05-01 DIAGNOSIS — G9340 Encephalopathy, unspecified: Secondary | ICD-10-CM | POA: Diagnosis not present

## 2023-05-01 DIAGNOSIS — Z515 Encounter for palliative care: Secondary | ICD-10-CM

## 2023-05-01 MED ORDER — MORPHINE SULFATE (CONCENTRATE) 10 MG /0.5 ML PO SOLN
10.0000 mg | ORAL | Status: DC | PRN
  Administered 2023-05-01 – 2023-05-04 (×12): 10 mg via SUBLINGUAL
  Filled 2023-05-01 (×14): qty 0.5

## 2023-05-01 NOTE — Progress Notes (Signed)
Gave patient IV [facility] for comfort care orders. Pt's family at bedside stating patient is talking about "seeing a light". Family supportive of patient and encouraging pt to "go towards the light". Pt's IV is painful with flush and pt does not tolerate adjusting her limbs. Messaged Dr Dareen Piano and requested med change to PO. See MAR for details.

## 2023-05-01 NOTE — Progress Notes (Signed)
  Progress Note  Patient: [name] JYN:[id number] DOB: Jan 28, 1933 DOA: 04/26/2023     5 DOS: the patient was seen and examined on 05/01/2023   Brief hospital course: Mr. Bithiah Martinz is an 89 year old female with history of advanced dementia, hypertension, hyperlipidemia, sinus bradycardia, recent Klebsiella UTI, who presents emergency department for concerns of ongoing diarrhea. Patient also had some dehydration, was given fluids.  Stool study positive for C. difficile toxin, patient is treated with Dificid for 10 days. 12/2.  Diarrhea improving, changed to oral vancomycin. 12/3.  Patient is seen by palliative care, transition to comfort care. 12/4: lost IV and patient/family declining a new access. Modifying medications to PO as able.   Principal Problem:   Encephalopathy acute Active Problems:   Carotid arterial disease (HCC)   Hyperlipidemia   Carotid artery disease (HCC)   Anemia, iron deficiency   Sinus bradycardia   Advanced dementia (HCC)   C. difficile diarrhea   Essential hypertension   Hypokalemia   Diarrhea   Pyuria   Failure to thrive in adult   Assessment and Plan:  C. difficile colitis. Hypokalemia secondary to diarrhea. Patient developed C. difficile after taking antibiotics treating UTI. Patient is seen by palliative care, due to poor prognosis, transition to comfort care.    Acute metabolic encephalopathy. Advanced dementia. Comfort care.   Failure to thrive. Anorexia. Per patient's son, patient has very poor appetite even before she developed C. difficile.  She is sleeping most of the time during the day.  Condition appears to be terminal, palliative care consult obtained.  Patient may be a candidate for hospice. Also talked about CODE STATUS, changed to DO NOT RESUSCITATE status. Continue comfort care.  Essential hypertension Resume home medicines.   Hyperlipidemia Discontinued statin.   Subjective:  Patient sleeping soundly. Did not  disturb.   Physical Exam: Vitals:   04/29/23 1544 04/29/23 2155 04/30/23 0754 04/30/23 1242  BP: 107/73 137/77 133/75 121/79  Pulse: (!) 102 87 77 88  Resp: 18 20 19 16   Temp: 98 F (36.7 C) 98.2 F (36.8 C) (!) 97.4 F (36.3 C) 99.4 F (37.4 C)  TempSrc:  Axillary  Axillary  SpO2: 93% 98% 98% 94%  Weight:      Height:       General exam: Appears calm and comfortable  Respiratory system: Clear to auscultation. Respiratory effort normal. Cardiovascular system: S1 & S2 heard, RRR. No JVD, murmurs, rubs, gallops or clicks. No pedal edema. Gastrointestinal system: Abdomen is nondistended, soft and nontender. No organomegaly or masses felt. Normal bowel sounds heard. Central nervous system: asleep Extremities: no edema Skin: No rashes, lesions or ulcers  Data Reviewed:  Lab results reviewed.  Family Communication: none at bedside.  Disposition: Status is: Inpatient Remains inpatient appropriate because: Comfort care.     Time spent: 35 minutes  Author: Leeroy Bock, MD 05/01/2023 7:31 AM  For on call review [email or web address].

## 2023-05-01 NOTE — Care Management Important Message (Signed)
Important Message  Patient Details  Name: DIAJA ON MRN: [id number] Date of Birth: Dec 04, 1932   Important Message Given:  Other (see comment)  Patient is on comfort care now with hopes to transferring to the hospice home. Out of respect for the patient and family no Important Message from [facility] given.   Olegario Messier A Labarron Durnin 05/01/2023, 9:15 AM

## 2023-05-01 NOTE — Progress Notes (Addendum)
Daily Progress Note   Patient Name: [name]       Date: 05/01/2023 DOB: October 30, 1932  Age: 89 y.o. MRN#: [id number] Attending Physician: Leeroy Bock, MD Primary Care Physician: Lauro Regulus, MD Admit Date: 04/26/2023  Reason for Consultation/Follow-up: Establishing goals of care  Subjective: Notes and labs reviewed. Patient now comfort care and waiting for placement.  Patient sitting in bed talking with son Fredrik Cove who is at bedside. Medications in place for symptom management.   Length of Stay: 5  Current Medications: Scheduled Meds:   amLODipine  10 mg Oral Daily   latanoprost  1 drop Both Eyes QHS   mirtazapine  7.5 mg Oral QHS    Continuous Infusions:   PRN Meds: acetaminophen **OR** acetaminophen, antiseptic oral rinse, glycopyrrolate **OR** glycopyrrolate **OR** glycopyrrolate, haloperidol **OR** haloperidol **OR** haloperidol lactate, LORazepam **OR** LORazepam **OR** LORazepam, morphine CONCENTRATE, polyvinyl alcohol  Physical Exam Pulmonary:     Effort: Pulmonary effort is normal.  Neurological:     Mental Status: She is alert.             Vital Signs: BP 119/66 (BP Location: Left Arm)   Pulse 74   Temp 98.9 F (37.2 C)   Resp 20   Ht 5\' 2"  (1.575 m)   Wt 70.2 kg   SpO2 95%   BMI 28.31 kg/m  SpO2: SpO2: 95 % O2 Device: O2 Device: Room Air O2 Flow Rate:    Intake/output summary: No intake or output data in the 24 hours ending 05/01/23 1541 LBM: Last BM Date : 04/30/23 Baseline Weight: Weight: 81.6 kg Most recent weight: Weight: 70.2 kg    Patient Active Problem List   Diagnosis Date Noted   Failure to thrive in adult 04/29/2023   Encephalopathy acute 04/26/2023   C. difficile diarrhea 04/26/2023   Essential hypertension  04/26/2023   Hypokalemia 04/26/2023   Diarrhea 04/26/2023   Pyuria 04/26/2023   Acute metabolic encephalopathy 04/12/2023   UTI (urinary tract infection) 04/12/2023   Advanced dementia (HCC) 04/12/2023   Sinus bradycardia 04/11/2023   Bradycardia 04/11/2023   Varicose veins of leg with swelling, bilateral 04/08/2018   Status post total left knee replacement 01/10/2018   Monoclonal gammopathy of unknown significance (MGUS) 06/11/2016   Abscess of gluteal region 02/21/2016   Psoas abscess (HCC)  02/21/2016   Malignant neoplasm of urinary bladder (HCC) 08/23/2015   Status post total right knee replacement 08/23/2015   Impingement syndrome of shoulder 03/30/2015   Impingement syndrome of right shoulder 03/30/2015   Anemia, iron deficiency 02/02/2015   IDA (iron deficiency anemia) 01/26/2015   Malignant neoplasm of bladder neck (HCC) 12/30/2014   Colon polyp 12/17/2014   Inflammation of sacroiliac joint (HCC) 09/22/2014   Carotid arterial disease (HCC) 01/09/2014   Hyperlipidemia 01/09/2014   Accumulation of fluid in tissues 01/09/2014   Benign hypertension with CKD (chronic kidney disease) stage III (HCC) 01/09/2014   Arthritis, degenerative 01/09/2014   OP (osteoporosis) 01/09/2014   Carotid artery disease (HCC) 01/09/2014   Edema 01/09/2014   Cervical osteoarthritis 10/28/2013   DDD (degenerative disc disease), cervical 10/28/2013   Acute blood loss anemia 05/16/2012   Fracture of parietal bone (HCC) 05/16/2012   Fall down stairs 05/16/2012   Hemorrhage into subarachnoid space of neuraxis (HCC) 05/16/2012   Subdural hematoma (HCC) 05/16/2012   Fracture of temporal bone (HCC) 05/16/2012   Traumatic subdural hemorrhage with loss of consciousness (HCC) 05/16/2012    Palliative Care Assessment & Plan    Recommendations/Plan: Comfort care waiting for placement.  Medications in place for symptom management.   Code Status:    Code Status Orders  (From admission, onward)            Start     Ordered   04/30/23 1118  Do not attempt resuscitation (DNR) - Comfort care  Continuous       Question Answer Comment  If patient has no pulse and is not breathing Do Not Attempt Resuscitation   In Pre-Arrest Conditions (Patient Is Breathing and Has a Pulse) Provide comfort measures. Relieve any mechanical airway obstruction. Avoid transfer unless required for comfort.   Consent: Discussion documented in EHR or advanced directives reviewed      04/30/23 1118           Code Status History     Date Active Date Inactive Code Status Order ID Comments User Context   04/30/2023 1113 04/30/2023 1118 Do not attempt resuscitation (DNR) - Comfort care [id number]  Morton Stall, NP Inpatient   04/29/2023 0856 04/30/2023 1113 Do not attempt resuscitation (DNR) PRE-ARREST INTERVENTIONS DESIRED [id number]  Marrion Coy, MD Inpatient   04/26/2023 1424 04/29/2023 0856 Full Code [id number]  Cox, Amy N, DO ED   04/11/2023 1315 04/12/2023 1638 Full Code [id number]  Emeline General, MD ED   06/16/2018 1757 06/18/2018 1644 Full Code [id number]  Donato Heinz, MD Inpatient   01/10/2018 1554 01/12/2018 1822 Full Code [id number]  Donato Heinz, MD Inpatient   01/17/2017 1402 01/17/2017 1820 Full Code [id number]  Kennedy Bucker, MD Inpatient      Advance Directive Documentation    Flowsheet Row Most Recent Value  Type of Advance Directive Healthcare Power of Attorney  Pre-existing out of facility DNR order (yellow form or pink MOST form) --  "MOST" Form in Place? --       Prognosis:  Poor    Thank you for allowing the Palliative Medicine Team to assist in the care of this patient.    Morton Stall, NP  Please contact Palliative Medicine Team phone at [phone] for questions and concerns.

## 2023-05-01 NOTE — Plan of Care (Signed)

## 2023-05-02 DIAGNOSIS — Z515 Encounter for palliative care: Secondary | ICD-10-CM | POA: Diagnosis not present

## 2023-05-02 DIAGNOSIS — Z7189 Other specified counseling: Secondary | ICD-10-CM | POA: Diagnosis not present

## 2023-05-02 DIAGNOSIS — G9340 Encephalopathy, unspecified: Secondary | ICD-10-CM | POA: Diagnosis not present

## 2023-05-02 NOTE — Progress Notes (Addendum)
[facility]- Civil engineer, contracting    Received request from Transitions of Care Manger for family interest in [facility].  Met with family to confirm interest and explain services. Hospice eligibility confirmed.   Unfortunately, [facility] is not able to offer a room today. Family and Transitions of Care Manager aware hospital liaison will follow up tomorrow or sooner if bed becomes available.   Please do not hesitate to call with any Hospice related questions. Thank you  [name] Liaison [phone]

## 2023-05-02 NOTE — TOC Progression Note (Signed)
Transition of Care [facility]) - Progression Note    Patient Details  Name: [name] MRN: [id number] Date of Birth: Jun 30, 1932  Transition of Care [facility]) CM/SW Contact  Allena Katz, LCSW Phone Number: 05/02/2023, 3:19 PM  Clinical Narrative:   [facility] pending bed.    Expected Discharge Plan: Memory Care Barriers to Discharge: Continued Medical Work up  Expected Discharge Plan and Services       Living arrangements for the past 2 months: Single Family Home                           HH Arranged: PT, OT [facility] Agency: [facility] Date [facility] Agency Contacted: 04/28/23   Representative spoke with at [facility] Agency: Elnita Maxwell   Social Determinants of Health (SDOH) Interventions SDOH Screenings   Food Insecurity: Patient Unable To Answer (04/27/2023)  Transportation Needs: No Transportation Needs (06/16/2018)  Financial Resource Strain: Low Risk  (06/16/2018)  Physical Activity: Inactive (06/16/2018)  Social Connections: Unknown (06/16/2018)  Stress: No Stress Concern Present (06/16/2018)  Tobacco Use: Low Risk  (04/26/2023)    Readmission Risk Interventions    04/28/2023    3:50 PM  Readmission Risk Prevention Plan  Transportation Screening Complete  PCP or Specialist Appt within 3-5 Days Complete  HRI or Home Care Consult Complete  Social Work Consult for Recovery Care Planning/Counseling Complete  Palliative Care Screening Not Applicable  Medication Review Ocean[employer]) Complete

## 2023-05-02 NOTE — Plan of Care (Signed)

## 2023-05-02 NOTE — Progress Notes (Signed)
  Progress Note Patient: [name] UJW:[id number] DOB: 11-Jan-1933 DOA: 04/26/2023     6 DOS: the patient was seen and examined on 05/02/2023   Brief hospital course: Mr. Wylma Sotak is an 89 year old female with history of advanced dementia, hypertension, hyperlipidemia, sinus bradycardia, recent Klebsiella UTI, who presents emergency department for concerns of ongoing diarrhea. Patient also had some dehydration, was given fluids.  Stool study positive for C. difficile toxin, patient is treated with Dificid for 10 days. 12/2.  Diarrhea improving, changed to oral vancomycin. 12/3.  Patient is seen by palliative care, transition to comfort care. 12/4: lost IV and patient/family declining a new access. Modifying medications to PO as able.  12/5: family has requested transfer to hospice house. She appears stable enough for transport today but no bed available. Will reassess in the morning  Principal Problem:   Encephalopathy acute Active Problems:   Carotid arterial disease (HCC)   Hyperlipidemia   Carotid artery disease (HCC)   Anemia, iron deficiency   Sinus bradycardia   Advanced dementia (HCC)   C. difficile diarrhea   Essential hypertension   Hypokalemia   Diarrhea   Pyuria   Failure to thrive in adult  Assessment and Plan: C. difficile colitis. Hypokalemia secondary to diarrhea. Patient developed C. difficile after taking antibiotics treating UTI. Patient is seen by palliative care, due to poor prognosis, transition to comfort care.    Acute metabolic encephalopathy. Advanced dementia. Comfort care.   Failure to thrive. Anorexia. Per patient's son, patient has very poor appetite even before she developed C. difficile.  She is sleeping most of the time during the day.  Condition appears to be terminal, palliative care consult obtained.  Patient may be a candidate for hospice. Also talked about CODE STATUS, changed to DO NOT RESUSCITATE status. Continue comfort  care.  Essential hypertension Resume home medicines.   Hyperlipidemia Discontinued statin.   Subjective:  Patient sleeping soundly. Did not disturb.   Physical Exam: Vitals:   04/29/23 2155 04/30/23 0754 04/30/23 1242 05/01/23 0804  BP: 137/77 133/75 121/79 119/66  Pulse: 87 77 88 74  Resp: 20 19 16 20   Temp: 98.2 F (36.8 C) (!) 97.4 F (36.3 C) 99.4 F (37.4 C) 98.9 F (37.2 C)  TempSrc: Axillary  Axillary   SpO2: 98% 98% 94% 95%  Weight:      Height:       General exam: Appears calm and comfortable  Respiratory system: Clear to auscultation. Respiratory effort normal. Cardiovascular system: S1 & S2 heard, RRR. No JVD, murmurs, rubs, gallops or clicks. No pedal edema. Gastrointestinal system: Abdomen is nondistended, soft and nontender. No organomegaly or masses felt. Normal bowel sounds heard. Central nervous system: asleep Extremities: no edema Skin: No rashes, lesions or ulcers  Data Reviewed:  Lab results reviewed.  Family Communication: son and grandson at bedside.  Disposition: Status is: Inpatient Remains inpatient appropriate because: Comfort care.     Time spent: 35 minutes  Author: Leeroy Bock, MD 05/02/2023 7:24 AM  For on call review [email or web address].

## 2023-05-02 NOTE — Plan of Care (Signed)
  Problem: Education: Goal: Knowledge of General Education information will improve Description: Including pain rating scale, medication(s)/side effects and non-pharmacologic comfort measures Outcome: Progressing   Problem: Health Behavior/Discharge Planning: Goal: Ability to manage health-related needs will improve Outcome: Progressing   Problem: Clinical Measurements: Goal: Ability to maintain clinical measurements within normal limits will improve Outcome: Progressing Goal: Will remain free from infection Outcome: Progressing Goal: Diagnostic test results will improve Outcome: Progressing Goal: Respiratory complications will improve Outcome: Progressing Goal: Cardiovascular complication will be avoided Outcome: Progressing   Problem: Activity: Goal: Risk for activity intolerance will decrease Outcome: Progressing   Problem: Nutrition: Goal: Adequate nutrition will be maintained Outcome: Progressing   Problem: Coping: Goal: Level of anxiety will decrease Outcome: Progressing   Problem: Elimination: Goal: Will not experience complications related to bowel motility Outcome: Progressing Goal: Will not experience complications related to urinary retention Outcome: Progressing   Problem: Pain Management: Goal: General experience of comfort will improve Outcome: Progressing   Problem: Safety: Goal: Ability to remain free from injury will improve Outcome: Progressing   Problem: Skin Integrity: Goal: Risk for impaired skin integrity will decrease Outcome: Progressing   Problem: Education: Goal: Knowledge of the prescribed therapeutic regimen will improve Outcome: Progressing   Problem: Coping: Goal: Ability to identify and develop effective coping behavior will improve Outcome: Progressing   Problem: Clinical Measurements: Goal: Quality of life will improve Outcome: Progressing   Problem: Respiratory: Goal: Verbalizations of increased ease of respirations  will increase Outcome: Progressing   Problem: Role Relationship: Goal: Family's ability to cope with current situation will improve Outcome: Progressing Goal: Ability to verbalize concerns, feelings, and thoughts to partner or family member will improve Outcome: Progressing   Problem: Pain Management: Goal: Satisfaction with pain management regimen will improve Outcome: Progressing

## 2023-05-02 NOTE — Progress Notes (Addendum)
Daily Progress Note   Patient Name: [name]       Date: 05/02/2023 DOB: 12-01-1932  Age: 89 y.o. MRN#: [id number] Attending Physician: Leeroy Bock, MD Primary Care Physician: Lauro Regulus, MD Admit Date: 04/26/2023  Reason for Consultation/Follow-up: Establishing goals of care  Subjective: This notes reviewed.  In to see patient.  Patient is sleeping at this time.  Patient's son is sitting at bedside.  He states that she has been drinking sips of fluid but has not really eaten anything.  Son discusses family is still interested in hospice facility placement in [place].  Length of Stay: 6  Current Medications: Scheduled Meds:   amLODipine  10 mg Oral Daily   latanoprost  1 drop Both Eyes QHS   mirtazapine  7.5 mg Oral QHS    Continuous Infusions:   PRN Meds: acetaminophen **OR** acetaminophen, antiseptic oral rinse, glycopyrrolate **OR** glycopyrrolate **OR** glycopyrrolate, haloperidol **OR** haloperidol **OR** haloperidol lactate, LORazepam **OR** LORazepam **OR** LORazepam, morphine CONCENTRATE, polyvinyl alcohol  Physical Exam Constitutional:      Comments: Eyes closed  Pulmonary:     Effort: Pulmonary effort is normal.             Vital Signs: BP 119/66 (BP Location: Left Arm)   Pulse 74   Temp 98.9 F (37.2 C)   Resp 20   Ht 5\' 2"  (1.575 m)   Wt 70.2 kg   SpO2 95%   BMI 28.31 kg/m  SpO2: SpO2: 95 % O2 Device: O2 Device: Room Air O2 Flow Rate:    Intake/output summary:  Intake/Output Summary (Last 24 hours) at 05/02/2023 1222 Last data filed at 05/02/2023 0416 Gross per 24 hour  Intake 0 ml  Output 400 ml  Net -400 ml   LBM: Last BM Date : 05/01/23 Baseline Weight: Weight: 81.6 kg Most recent weight: Weight: 70.2  kg   Patient Active Problem List   Diagnosis Date Noted   Failure to thrive in adult 04/29/2023   Encephalopathy acute 04/26/2023   C. difficile diarrhea 04/26/2023   Essential hypertension 04/26/2023   Hypokalemia 04/26/2023   Diarrhea 04/26/2023   Pyuria 04/26/2023   Acute metabolic encephalopathy 04/12/2023   UTI (urinary tract infection) 04/12/2023   Advanced dementia (HCC) 04/12/2023   Sinus bradycardia 04/11/2023   Bradycardia 04/11/2023  Varicose veins of leg with swelling, bilateral 04/08/2018   Status post total left knee replacement 01/10/2018   Monoclonal gammopathy of unknown significance (MGUS) 06/11/2016   Abscess of gluteal region 02/21/2016   Psoas abscess (HCC) 02/21/2016   Malignant neoplasm of urinary bladder (HCC) 08/23/2015   Status post total right knee replacement 08/23/2015   Impingement syndrome of shoulder 03/30/2015   Impingement syndrome of right shoulder 03/30/2015   Anemia, iron deficiency 02/02/2015   IDA (iron deficiency anemia) 01/26/2015   Malignant neoplasm of bladder neck (HCC) 12/30/2014   Colon polyp 12/17/2014   Inflammation of sacroiliac joint (HCC) 09/22/2014   Carotid arterial disease (HCC) 01/09/2014   Hyperlipidemia 01/09/2014   Accumulation of fluid in tissues 01/09/2014   Benign hypertension with CKD (chronic kidney disease) stage III (HCC) 01/09/2014   Arthritis, degenerative 01/09/2014   OP (osteoporosis) 01/09/2014   Carotid artery disease (HCC) 01/09/2014   Edema 01/09/2014   Cervical osteoarthritis 10/28/2013   DDD (degenerative disc disease), cervical 10/28/2013   Acute blood loss anemia 05/16/2012   Fracture of parietal bone (HCC) 05/16/2012   Fall down stairs 05/16/2012   Hemorrhage into subarachnoid space of neuraxis (HCC) 05/16/2012   Subdural hematoma (HCC) 05/16/2012   Fracture of temporal bone (HCC) 05/16/2012   Traumatic subdural hemorrhage with loss of consciousness (HCC) 05/16/2012    Palliative Care  Assessment & Plan    Recommendations/Plan: Patient on comfort care. Family remains interested in hospice facility placement in [place]. PMT will sign off.  Please reach out if needs arise.  Code Status:    Code Status Orders  (From admission, onward)           Start     Ordered   04/30/23 1118  Do not attempt resuscitation (DNR) - Comfort care  Continuous       Question Answer Comment  If patient has no pulse and is not breathing Do Not Attempt Resuscitation   In Pre-Arrest Conditions (Patient Is Breathing and Has a Pulse) Provide comfort measures. Relieve any mechanical airway obstruction. Avoid transfer unless required for comfort.   Consent: Discussion documented in EHR or advanced directives reviewed      04/30/23 1118           Code Status History     Date Active Date Inactive Code Status Order ID Comments User Context   04/30/2023 1113 04/30/2023 1118 Do not attempt resuscitation (DNR) - Comfort care [id number]  Morton Stall, NP Inpatient   04/29/2023 0856 04/30/2023 1113 Do not attempt resuscitation (DNR) PRE-ARREST INTERVENTIONS DESIRED [id number]  Marrion Coy, MD Inpatient   04/26/2023 1424 04/29/2023 0856 Full Code [id number]  Cox, Amy N, DO ED   04/11/2023 1315 04/12/2023 1638 Full Code [id number]  Emeline General, MD ED   06/16/2018 1757 06/18/2018 1644 Full Code [id number]  Donato Heinz, MD Inpatient   01/10/2018 1554 01/12/2018 1822 Full Code [id number]  Donato Heinz, MD Inpatient   01/17/2017 1402 01/17/2017 1820 Full Code [id number]  Kennedy Bucker, MD Inpatient      Advance Directive Documentation    Flowsheet Row Most Recent Value  Type of Advance Directive Healthcare Power of Attorney  Pre-existing out of facility DNR order (yellow form or pink MOST form) --  "MOST" Form in Place? --      Care plan was discussed with attending, TOC, hospice liaison via epic chat.  Thank you for allowing the Palliative Medicine Team to assist in the care of this  patient.  Morton Stall, NP  Please contact Palliative Medicine Team phone at [phone] for questions and concerns.

## 2023-05-03 DIAGNOSIS — G9340 Encephalopathy, unspecified: Secondary | ICD-10-CM | POA: Diagnosis not present

## 2023-05-03 DIAGNOSIS — Z7189 Other specified counseling: Secondary | ICD-10-CM

## 2023-05-03 MED ORDER — ACETAMINOPHEN 650 MG RE SUPP: 650.0000 mg | Freq: Four times a day (QID) | RECTAL | Status: DC | PRN

## 2023-05-03 MED ORDER — ACETAMINOPHEN 325 MG PO TABS: 650.0000 mg | ORAL_TABLET | Freq: Four times a day (QID) | ORAL | Status: DC | PRN

## 2023-05-03 NOTE — Plan of Care (Signed)
  Problem: Education: Goal: Knowledge of General Education information will improve Description: Including pain rating scale, medication(s)/side effects and non-pharmacologic comfort measures Outcome: Progressing   Problem: Health Behavior/Discharge Planning: Goal: Ability to manage health-related needs will improve Outcome: Progressing   Problem: Clinical Measurements: Goal: Ability to maintain clinical measurements within normal limits will improve Outcome: Progressing Goal: Will remain free from infection Outcome: Progressing Goal: Diagnostic test results will improve Outcome: Progressing Goal: Respiratory complications will improve Outcome: Progressing Goal: Cardiovascular complication will be avoided Outcome: Progressing   Problem: Activity: Goal: Risk for activity intolerance will decrease Outcome: Progressing   Problem: Nutrition: Goal: Adequate nutrition will be maintained Outcome: Progressing   Problem: Coping: Goal: Level of anxiety will decrease Outcome: Progressing   Problem: Elimination: Goal: Will not experience complications related to bowel motility Outcome: Progressing Goal: Will not experience complications related to urinary retention Outcome: Progressing   Problem: Pain Management: Goal: General experience of comfort will improve Outcome: Progressing   Problem: Safety: Goal: Ability to remain free from injury will improve Outcome: Progressing   Problem: Skin Integrity: Goal: Risk for impaired skin integrity will decrease Outcome: Progressing   Problem: Education: Goal: Knowledge of the prescribed therapeutic regimen will improve Outcome: Progressing   Problem: Coping: Goal: Ability to identify and develop effective coping behavior will improve Outcome: Progressing   Problem: Clinical Measurements: Goal: Quality of life will improve Outcome: Progressing   Problem: Respiratory: Goal: Verbalizations of increased ease of respirations  will increase Outcome: Progressing   Problem: Role Relationship: Goal: Family's ability to cope with current situation will improve Outcome: Progressing Goal: Ability to verbalize concerns, feelings, and thoughts to partner or family member will improve Outcome: Progressing   Problem: Pain Management: Goal: Satisfaction with pain management regimen will improve Outcome: Progressing

## 2023-05-03 NOTE — Plan of Care (Signed)
  Problem: Education: Goal: Knowledge of General Education information will improve Description: Including pain rating scale, medication(s)/side effects and non-pharmacologic comfort measures Outcome: Progressing   Problem: Health Behavior/Discharge Planning: Goal: Ability to manage health-related needs will improve Outcome: Progressing   Problem: Clinical Measurements: Goal: Ability to maintain clinical measurements within normal limits will improve Outcome: Progressing Goal: Will remain free from infection Outcome: Progressing Goal: Diagnostic test results will improve Outcome: Progressing Goal: Respiratory complications will improve Outcome: Progressing Goal: Cardiovascular complication will be avoided Outcome: Progressing   Problem: Nutrition: Goal: Adequate nutrition will be maintained Outcome: Progressing   Problem: Coping: Goal: Level of anxiety will decrease Outcome: Progressing   Problem: Elimination: Goal: Will not experience complications related to bowel motility Outcome: Progressing Goal: Will not experience complications related to urinary retention Outcome: Progressing   Problem: Pain Management: Goal: General experience of comfort will improve Outcome: Progressing   Problem: Safety: Goal: Ability to remain free from injury will improve Outcome: Progressing   Problem: Skin Integrity: Goal: Risk for impaired skin integrity will decrease Outcome: Progressing   Problem: Education: Goal: Knowledge of the prescribed therapeutic regimen will improve Outcome: Progressing   Problem: Coping: Goal: Ability to identify and develop effective coping behavior will improve Outcome: Progressing   Problem: Clinical Measurements: Goal: Quality of life will improve Outcome: Progressing   Problem: Role Relationship: Goal: Family's ability to cope with current situation will improve Outcome: Progressing Goal: Ability to verbalize concerns, feelings, and  thoughts to partner or family member will improve Outcome: Progressing   Problem: Pain Management: Goal: Satisfaction with pain management regimen will improve Outcome: Progressing   Problem: Respiratory: Goal: Verbalizations of increased ease of respirations will increase Outcome: Progressing

## 2023-05-03 NOTE — Progress Notes (Signed)
[facility]- Civil engineer, contracting  No bed availability today at the [facility].  Patient's son and hospital team aware.    Please don't hesitate to call with any Hospice related questions or concerns.    Thank you for the opportunity to participate in this patient's care. [facility] Liaison [phone]

## 2023-05-03 NOTE — Progress Notes (Signed)
  Progress Note Patient: [name] ZOX:[id number] DOB: [birth year] DOA: 04/26/2023     7 DOS: the patient was seen and examined on 05/03/2023   Brief hospital course: Mr. Volanda Hallak is an 89 year old female with history of advanced dementia, hypertension, hyperlipidemia, sinus bradycardia, recent Klebsiella UTI, who presents emergency department for concerns of ongoing diarrhea. Patient also had some dehydration, was given fluids.  Stool study positive for C. difficile toxin, patient is treated with Dificid for 10 days. 12/2.  Diarrhea improving, changed to oral vancomycin. 12/3.  Patient is seen by palliative care, transition to comfort care. 12/4: lost IV and patient/family declining a new access. Modifying medications to PO as able.  12/5-12/6: family has requested transfer to hospice house. She appears stable enough for transport today but no bed available. Will reassess in the morning  Principal Problem:   Encephalopathy acute Active Problems:   Carotid arterial disease (HCC)   Hyperlipidemia   Carotid artery disease (HCC)   Anemia, iron deficiency   Sinus bradycardia   Advanced dementia (HCC)   C. difficile diarrhea   Essential hypertension   Hypokalemia   Diarrhea   Pyuria   Failure to thrive in adult  Assessment and Plan: C. difficile colitis. Hypokalemia secondary to diarrhea. Patient developed C. difficile after taking antibiotics treating UTI. Patient is seen by palliative care, due to poor prognosis, transition to comfort care.    Acute metabolic encephalopathy. Advanced dementia. Comfort care.   Failure to thrive. Anorexia. Per patient's son, patient has very poor appetite even before she developed C. difficile.  She is sleeping most of the time during the day.  Condition appears to be terminal, palliative care consult obtained.  Patient may be a candidate for hospice. Also talked about CODE STATUS, changed to DO NOT RESUSCITATE status. Continue  comfort care.  Essential hypertension Resume home medicines.   Hyperlipidemia Discontinued statin.   Subjective:  Patient sleeping soundly. Did not disturb.   Physical Exam: Vitals:   04/30/23 0754 04/30/23 1242 05/01/23 0804 05/03/23 0503  BP: 133/75 121/79 119/66 139/61  Pulse: 77 88 74   Resp: 19 16 20 18   Temp: (!) 97.4 F (36.3 C) 99.4 F (37.4 C) 98.9 F (37.2 C) 97.7 F (36.5 C)  TempSrc:  Axillary    SpO2: 98% 94% 95% 94%  Weight:      Height:       General exam: Appears calm and comfortable  Respiratory system: Clear to auscultation. Respiratory effort normal. Cardiovascular system: S1 & S2 heard, RRR. No JVD, murmurs, rubs, gallops or clicks. No pedal edema. Gastrointestinal system: Abdomen is nondistended, soft and nontender. No organomegaly or masses felt. Normal bowel sounds heard. Central nervous system: asleep Extremities: no edema Skin: No rashes, lesions or ulcers  Data Reviewed:  Lab results reviewed.  Family Communication: son at bedside.  Disposition: Status is: Inpatient Remains inpatient appropriate because: Comfort care.     Time spent: 35 minutes  Author: Leeroy Bock, MD 05/03/2023 7:16 AM  For on call review [email or web address].

## 2023-05-04 DIAGNOSIS — G9340 Encephalopathy, unspecified: Secondary | ICD-10-CM | POA: Diagnosis not present

## 2023-05-04 MED ORDER — LORAZEPAM 2 MG/ML PO CONC: 1.0000 mg | ORAL | 0 refills | Status: DC | PRN

## 2023-05-04 MED ORDER — GLYCOPYRROLATE 0.2 MG/ML IJ SOLN: 0.2000 mg | INTRAMUSCULAR | 0 refills | Status: DC | PRN

## 2023-05-04 MED ORDER — SCOPOLAMINE 1 MG/3DAYS TD PT72
1.0000 | MEDICATED_PATCH | TRANSDERMAL | Status: DC
  Administered 2023-05-04: 1.5 mg via TRANSDERMAL
  Filled 2023-05-04: qty 1

## 2023-05-04 MED ORDER — HALOPERIDOL LACTATE 2 MG/ML PO CONC: 0.5000 mg | ORAL | 0 refills | Status: DC | PRN

## 2023-05-04 MED ORDER — MORPHINE SULFATE (CONCENTRATE) 10 MG /0.5 ML PO SOLN
10.0000 mg | Freq: Once | ORAL | Status: AC
  Administered 2023-05-04: 10 mg via SUBLINGUAL

## 2023-05-04 MED ORDER — SCOPOLAMINE 1 MG/3DAYS TD PT72: 1.0000 | MEDICATED_PATCH | TRANSDERMAL | 12 refills | Status: DC

## 2023-05-04 MED ORDER — POLYVINYL ALCOHOL 1.4 % OP SOLN: 1.0000 [drp] | Freq: Four times a day (QID) | OPHTHALMIC | 0 refills | Status: DC | PRN

## 2023-05-04 MED ORDER — MORPHINE SULFATE (CONCENTRATE) 10 MG /0.5 ML PO SOLN: 10.0000 mg | ORAL | 0 refills | Status: DC | PRN

## 2023-05-04 NOTE — Discharge Summary (Signed)
Physician Discharge Summary   Patient: [name] MRN: [id number] DOB: [birth year]  Admit date:     04/26/2023  Discharge date: 05/04/23  Discharge Physician: Enedina Finner   PCP: Lauro Regulus, MD   Discharge Diagnoses: Principal Problem:   Encephalopathy acute Active Problems:   Carotid arterial disease (HCC)   Hyperlipidemia   Carotid artery disease (HCC)   Anemia, iron deficiency   Sinus bradycardia   Advanced dementia (HCC)   C. difficile diarrhea   Essential hypertension   Hypokalemia   Diarrhea   Pyuria   Failure to thrive in adult   [name] is an 89 year old female with history of advanced dementia, hypertension, hyperlipidemia, sinus bradycardia, recent Klebsiella UTI, who presents emergency department for concerns of ongoing diarrhea. Patient also had some dehydration, was given fluids.  Stool study positive for C. difficile toxin, patient is treated with Dificid for 10 days.  Failure to thrive. Anorexia. --Per patient's family, patient has very poor appetite even before she developed C. difficile.  She is sleeping most of the time during the day.  Condition appears to be terminal, palliative care consult obtained. Family request Comfort care after d/w with primary attending and palliaitve care --pt is DNR/DNI --Continue comfort care. --approved for IPU--will transfer today  C. difficile colitis. Hypokalemia secondary to diarrhea. --Patient developed C. difficile after taking antibiotics treating UTI. --Patient is seen by palliative care, due to poor prognosis, transitioned to comfort care.    Acute metabolic encephalopathy. Advanced dementia. --Comfort care.   Essential hypertension  Hyperlipidemia  D/w pt's son at bedside today. Will transfer to [facility] hpsice facility today       Disposition: Hospice care Diet recommendation:  Discharge Diet Orders (From admission, onward)     Start     Ordered   05/04/23 0000  Diet - low sodium  heart healthy        05/04/23 1034            DISCHARGE MEDICATION: Allergies as of 05/04/2023       Reactions   Tramadol Nausea And Vomiting        Medication List     STOP taking these medications    amLODipine 10 MG tablet Commonly known as: NORVASC   calcium-vitamin D 500-200 MG-UNIT tablet Commonly known as: OSCAL WITH D   cyanocobalamin 1000 MCG tablet Commonly known as: VITAMIN B12   donepezil 5 MG tablet Commonly known as: ARICEPT   escitalopram 10 MG tablet Commonly known as: LEXAPRO   fosfomycin 3 g Pack Commonly known as: MONUROL   latanoprost 0.005 % ophthalmic solution Commonly known as: XALATAN   pravastatin 40 MG tablet Commonly known as: PRAVACHOL   raloxifene 60 MG tablet Commonly known as: EVISTA   Vitamin D3 25 MCG (1000 UT) Caps       TAKE these medications    glycopyrrolate 0.2 MG/ML injection Commonly known as: ROBINUL Inject 1 mL (0.2 mg total) into the skin every 4 (four) hours as needed (excessive secretions).   haloperidol 2 MG/ML solution Commonly known as: HALDOL Place 0.3 mLs (0.6 mg total) under the tongue every 4 (four) hours as needed for agitation (or delirium).   LORazepam 2 MG/ML concentrated solution Commonly known as: ATIVAN Place 0.5 mLs (1 mg total) under the tongue every 4 (four) hours as needed for anxiety.   morphine CONCENTRATE 10 mg / 0.5 ml concentrated solution Place 0.5 mLs (10 mg total) under the tongue every 2 (two) hours as  needed for severe pain (pain score 7-10) or anxiety (cannot tolerate IV).   polyvinyl alcohol 1.4 % ophthalmic solution Commonly known as: LIQUIFILM TEARS Place 1 drop into both eyes 4 (four) times daily as needed for dry eyes.   scopolamine 1 MG/3DAYS Commonly known as: TRANSDERM-SCOP Place 1 patch (1.5 mg total) onto the skin every 3 (three) days. Start taking on: May 07, 2023         Condition at discharge: poor  The results of significant diagnostics  from this hospitalization (including imaging, microbiology, ancillary and laboratory) are listed below for reference.   Imaging Studies: DG Chest Port 1 View  Result Date: 04/26/2023 CLINICAL DATA:  Altered mental status. EXAM: PORTABLE CHEST 1 VIEW COMPARISON:  January 08, 2017 FINDINGS: The cardiac silhouette is borderline in size. There is tortuosity of the descending thoracic aorta. Mild, diffuse, chronic appearing increased lung markings are seen. There is no evidence of an acute infiltrate, pleural effusion or pneumothorax. Multilevel degenerative changes seen throughout the thoracic spine with postoperative changes seen within the visualized portion of the mid to upper lumbar spine. IMPRESSION: Chronic appearing increased lung markings without evidence of acute or active cardiopulmonary disease. Electronically Signed   By: Aram Candela M.D.   On: 04/26/2023 19:47   CT Head Wo Contrast  Result Date: 04/11/2023 CLINICAL DATA:  Provided history: Mental status change, unknown cause. EXAM: CT HEAD WITHOUT CONTRAST TECHNIQUE: Contiguous axial images were obtained from the base of the skull through the vertex without intravenous contrast. RADIATION DOSE REDUCTION: This exam was performed according to the departmental dose-optimization program which includes automated exposure control, adjustment of the mA and/or kV according to patient size and/or use of iterative reconstruction technique. COMPARISON:  MRI 11/23/2020 FINDINGS: Brain: Generalized cerebral atrophy. Foci of chronic encephalomalacia/gliosis again noted within the anteroinferior frontal lobes and anterior temporal lobes bilaterally. Background patchy and ill-defined hypoattenuation within the cerebral white matter, nonspecific but compatible with mild chronic small vessel ischemic disease. There is no acute intracranial hemorrhage. No acute demarcated cortical infarct. No extra-axial fluid collection. No evidence of an intracranial mass.  No midline shift. Vascular: No hyperdense vessel.  Atherosclerotic calcifications. Skull: No calvarial fracture or aggressive osseous lesion. Sinuses/Orbits: No mass or acute finding within the imaged orbits. Severe bilateral maxillary sinusitis (with associated chronic reactive osteitis). Opacification of a single right ethmoid air cell. Other: Chronic fracture deformity of the posterolateral wall of the right maxillary sinus. Surgical fixation hardware along the anterior walls of both maxillary sinuses. IMPRESSION: 1. No evidence of an acute intracranial abnormality. 2. Foci of chronic encephalomalacia/gliosis again noted within the anteroinferior frontal lobes and anterior temporal lobes bilaterally. Findings are likely posttraumatic in etiology. 3. Background mild cerebral white matter chronic small vessel ischemic disease. 4. Generalized cerebral atrophy. 5. Paranasal sinus disease, as described. Electronically Signed   By: Jackey Loge D.O.   On: 04/11/2023 10:12    Microbiology: Results for orders placed or performed during the hospital encounter of 04/26/23  C Difficile Quick Screen w PCR reflex     Status: Abnormal   Collection Time: 04/26/23 12:16 PM   Specimen: Urine, In & Out Cath; Stool  Result Value Ref Range Status   C Diff antigen POSITIVE (A) NEGATIVE Final   C Diff toxin POSITIVE (A) NEGATIVE Final   C Diff interpretation Toxin producing C. difficile detected.  Final    Comment: CRITICAL RESULT CALLED TO, READ BACK BY AND VERIFIED WITH: MELISSA RUNGE AT 1353 04/26/23.PMF Performed  at [facility], [address] [place], [place] [address]      Discharge time spent: greater than 30 minutes.  Signed: Enedina Finner, MD [facility] 05/04/2023

## 2023-05-04 NOTE — Plan of Care (Signed)
  Problem: Education: Goal: Knowledge of General Education information will improve Description: Including pain rating scale, medication(s)/side effects and non-pharmacologic comfort measures Outcome: Completed/Met   Problem: Health Behavior/Discharge Planning: Goal: Ability to manage health-related needs will improve Outcome: Completed/Met   Problem: Clinical Measurements: Goal: Ability to maintain clinical measurements within normal limits will improve Outcome: Completed/Met Goal: Will remain free from infection Outcome: Completed/Met Goal: Diagnostic test results will improve Outcome: Completed/Met Goal: Respiratory complications will improve Outcome: Completed/Met Goal: Cardiovascular complication will be avoided Outcome: Completed/Met   Problem: Activity: Goal: Risk for activity intolerance will decrease Outcome: Completed/Met   Problem: Nutrition: Goal: Adequate nutrition will be maintained Outcome: Completed/Met   Problem: Coping: Goal: Level of anxiety will decrease Outcome: Completed/Met   Problem: Elimination: Goal: Will not experience complications related to bowel motility Outcome: Completed/Met Goal: Will not experience complications related to urinary retention Outcome: Completed/Met   Problem: Pain Management: Goal: General experience of comfort will improve Outcome: Completed/Met   Problem: Safety: Goal: Ability to remain free from injury will improve Outcome: Completed/Met   Problem: Skin Integrity: Goal: Risk for impaired skin integrity will decrease Outcome: Completed/Met   Problem: Education: Goal: Knowledge of the prescribed therapeutic regimen will improve Outcome: Completed/Met   Problem: Coping: Goal: Ability to identify and develop effective coping behavior will improve Outcome: Completed/Met   Problem: Clinical Measurements: Goal: Quality of life will improve Outcome: Completed/Met   Problem: Respiratory: Goal: Verbalizations  of increased ease of respirations will increase Outcome: Completed/Met   Problem: Role Relationship: Goal: Family's ability to cope with current situation will improve Outcome: Completed/Met Goal: Ability to verbalize concerns, feelings, and thoughts to partner or family member will improve Outcome: Completed/Met   Problem: Pain Management: Goal: Satisfaction with pain management regimen will improve Outcome: Completed/Met

## 2023-05-04 NOTE — Progress Notes (Signed)
Patient report given to RN shona. All questions are answered via phone. No any questions or concern at this time.

## 2023-05-04 NOTE — Plan of Care (Signed)
  Problem: Education: Goal: Knowledge of General Education information will improve Description: Including pain rating scale, medication(s)/side effects and non-pharmacologic comfort measures Outcome: Not Progressing   Problem: Health Behavior/Discharge Planning: Goal: Ability to manage health-related needs will improve Outcome: Not Progressing   Problem: Clinical Measurements: Goal: Ability to maintain clinical measurements within normal limits will improve Outcome: Not Progressing Goal: Will remain free from infection Outcome: Not Progressing Goal: Diagnostic test results will improve Outcome: Not Progressing Goal: Respiratory complications will improve Outcome: Not Progressing Goal: Cardiovascular complication will be avoided Outcome: Not Progressing   Problem: Activity: Goal: Risk for activity intolerance will decrease Outcome: Not Progressing   Problem: Nutrition: Goal: Adequate nutrition will be maintained Outcome: Not Progressing   Problem: Coping: Goal: Level of anxiety will decrease Outcome: Not Progressing   Problem: Elimination: Goal: Will not experience complications related to bowel motility Outcome: Not Progressing Goal: Will not experience complications related to urinary retention Outcome: Not Progressing   Problem: Pain Management: Goal: General experience of comfort will improve Outcome: Not Progressing   Problem: Safety: Goal: Ability to remain free from injury will improve Outcome: Not Progressing   Problem: Skin Integrity: Goal: Risk for impaired skin integrity will decrease Outcome: Not Progressing   Problem: Education: Goal: Knowledge of the prescribed therapeutic regimen will improve Outcome: Not Progressing   Problem: Coping: Goal: Ability to identify and develop effective coping behavior will improve Outcome: Not Progressing   Problem: Clinical Measurements: Goal: Quality of life will improve Outcome: Not Progressing   Problem:  Respiratory: Goal: Verbalizations of increased ease of respirations will increase Outcome: Not Progressing   Problem: Role Relationship: Goal: Family's ability to cope with current situation will improve Outcome: Not Progressing Goal: Ability to verbalize concerns, feelings, and thoughts to partner or family member will improve Outcome: Not Progressing   Problem: Pain Management: Goal: Satisfaction with pain management regimen will improve Outcome: Not Progressing

## 2023-05-04 NOTE — Progress Notes (Signed)
[facility] LIAISON NOTE   Follow up on current patient approved for Inpatient Hospice Unit [facility]) for routine end of life care.  Patient has required multiple doses of PO Morphine yesterday and this morning for dyspnea.  She is also requiring added meds for secretions (Scopolamine patch and [place].  Discussed with Dr. Allena Katz, who will order IV morphine to better manage her dyspnea.  Spoke with Dr. Mikle Bosworth, hospice physician, who is changing her approval to IPU to GIP.   Spoke with son, Molly Maduro, who is in agreement with changing patient to IV form of morphine for better management of her symptoms.  Discussed that patient had been approved for higher level of care.  Consents being completed by his brother Starla Link.  [facility] RN to call report to the Hospice Home at [phone]  This RN will arrange EMS transportation when the hospital team is ready for discharge.  Please medicate the patient prior to EMS transport as needed for comfort during transport.  Please send signed and completed DNR home with patient/family if applicable.   Above information shared with George Hugh, Transitions of Care Manager and hospital medical team.   Please call with any hospice related questions or concerns.  Thank you for the opportunity to participate in this patient's care.   Norris Cross, RN Nurse Liaison [phone]

## 2023-05-07 ENCOUNTER — Telehealth (HOSPITAL_COMMUNITY): Payer: Self-pay | Admitting: Pharmacy Technician

## 2023-05-07 ENCOUNTER — Other Ambulatory Visit (HOSPITAL_COMMUNITY): Payer: Self-pay

## 2023-05-07 NOTE — Telephone Encounter (Signed)
Pharmacy Patient Advocate Encounter  Received notification from [facility] that Prior Authorization for Haloperidol Lactate 2MG /ML concentrate  has been APPROVED from 05/07/2023 to 05/06/2024. Ran test claim, Copay is $6.00. This test claim was processed through [facility]- copay amounts may vary at other pharmacies due to pharmacy/plan contracts, or as the patient moves through the different stages of their insurance plan.   PA #/Case ID/Reference #: [id number]

## 2023-05-07 NOTE — Telephone Encounter (Signed)
Pharmacy Patient Advocate Encounter   Received notification from Fax that prior authorization for Haloperidol Lactate 2MG /ML concentrate is required/requested.   Insurance verification completed.   The patient is insured through [facility] .   Per test claim: PA required; PA submitted to above mentioned insurance via CoverMyMeds Key/confirmation #/EOC [id number] Status is pending

## 2023-05-07 NOTE — Telephone Encounter (Signed)
Pharmacy Patient Advocate Encounter   Received notification from Fax that prior authorization for Scopolamine 1MG /3DAYS 72 hr patches  is required/requested.   Insurance verification completed.   The patient is insured through [facility] .   Per test claim: PA required; PA submitted to above mentioned insurance via CoverMyMeds Key/confirmation #/EOC [id number] Status is pending

## 2023-05-07 NOTE — Telephone Encounter (Signed)
Pharmacy Patient Advocate Encounter  Received notification from [facility] that Prior Authorization for Scopolamine 1MG /3DAYS 72 hr patches  has been DENIED.  Full denial letter will be uploaded to the media tab. See denial reason below. We denied this request under Medicare Part D because Scopolamine is not being prescribed for an FDA labeled or medically accepted use. A medically accepted use is approved by the FDA or supported by the [facility] Formulary Service Drug Information and the DRUGDEX Information System. In this case, Scopolamine is not being prescribed in accordance with an FDA labeled use or use accepted by the Medicare approved drug compendia.  PA #/Case ID/Reference #: [id number]

## 2023-05-29 DEATH — deceased

## 2023-06-04 ENCOUNTER — Other Ambulatory Visit: Payer: Self-pay | Admitting: Urology

## 2023-06-07 ENCOUNTER — Encounter: Payer: Self-pay | Admitting: Urology
